# Patient Record
Sex: Female | Born: 1965
Health system: Southern US, Community
[De-identification: ages and names within clinical notes are randomized; demographics above are authoritative.]

## PROBLEM LIST (undated history)

## (undated) DIAGNOSIS — G479 Sleep disorder, unspecified: Secondary | ICD-10-CM

## (undated) DIAGNOSIS — F329 Major depressive disorder, single episode, unspecified: Secondary | ICD-10-CM

## (undated) DIAGNOSIS — R32 Unspecified urinary incontinence: Secondary | ICD-10-CM

## (undated) DIAGNOSIS — R12 Heartburn: Secondary | ICD-10-CM

## (undated) DIAGNOSIS — M549 Dorsalgia, unspecified: Secondary | ICD-10-CM

## (undated) DIAGNOSIS — R519 Headache, unspecified: Secondary | ICD-10-CM

## (undated) DIAGNOSIS — M51369 Other intervertebral disc degeneration, lumbar region without mention of lumbar back pain or lower extremity pain: Secondary | ICD-10-CM

## (undated) DIAGNOSIS — B019 Varicella without complication: Secondary | ICD-10-CM

## (undated) DIAGNOSIS — M5136 Other intervertebral disc degeneration, lumbar region: Secondary | ICD-10-CM

## (undated) DIAGNOSIS — G40909 Epilepsy, unspecified, not intractable, without status epilepticus: Secondary | ICD-10-CM

## (undated) DIAGNOSIS — N3010 Interstitial cystitis (chronic) without hematuria: Secondary | ICD-10-CM

## (undated) DIAGNOSIS — R51 Headache: Secondary | ICD-10-CM

## (undated) DIAGNOSIS — R5383 Other fatigue: Secondary | ICD-10-CM

## (undated) DIAGNOSIS — K219 Gastro-esophageal reflux disease without esophagitis: Secondary | ICD-10-CM

## (undated) DIAGNOSIS — R002 Palpitations: Secondary | ICD-10-CM

## (undated) DIAGNOSIS — M545 Low back pain, unspecified: Secondary | ICD-10-CM

## (undated) DIAGNOSIS — E785 Hyperlipidemia, unspecified: Secondary | ICD-10-CM

## (undated) DIAGNOSIS — M069 Rheumatoid arthritis, unspecified: Secondary | ICD-10-CM

## (undated) DIAGNOSIS — H539 Unspecified visual disturbance: Secondary | ICD-10-CM

## (undated) DIAGNOSIS — M5126 Other intervertebral disc displacement, lumbar region: Secondary | ICD-10-CM

## (undated) DIAGNOSIS — I10 Essential (primary) hypertension: Secondary | ICD-10-CM

## (undated) DIAGNOSIS — R42 Dizziness and giddiness: Secondary | ICD-10-CM

## (undated) DIAGNOSIS — K829 Disease of gallbladder, unspecified: Secondary | ICD-10-CM

## (undated) DIAGNOSIS — F32A Depression, unspecified: Secondary | ICD-10-CM

## (undated) DIAGNOSIS — G4730 Sleep apnea, unspecified: Secondary | ICD-10-CM

## (undated) DIAGNOSIS — E559 Vitamin D deficiency, unspecified: Secondary | ICD-10-CM

## (undated) DIAGNOSIS — R404 Transient alteration of awareness: Secondary | ICD-10-CM

## (undated) DIAGNOSIS — K589 Irritable bowel syndrome without diarrhea: Secondary | ICD-10-CM

## (undated) DIAGNOSIS — M722 Plantar fascial fibromatosis: Secondary | ICD-10-CM

## (undated) DIAGNOSIS — K5900 Constipation, unspecified: Secondary | ICD-10-CM

## (undated) DIAGNOSIS — T7840XA Allergy, unspecified, initial encounter: Secondary | ICD-10-CM

## (undated) DIAGNOSIS — H40009 Preglaucoma, unspecified, unspecified eye: Secondary | ICD-10-CM

## (undated) DIAGNOSIS — R0602 Shortness of breath: Secondary | ICD-10-CM

## (undated) DIAGNOSIS — J45909 Unspecified asthma, uncomplicated: Secondary | ICD-10-CM

## (undated) DIAGNOSIS — F419 Anxiety disorder, unspecified: Secondary | ICD-10-CM

## (undated) HISTORY — DX: Disease of gallbladder, unspecified: K82.9

## (undated) HISTORY — DX: Headache, unspecified: R51.9

## (undated) HISTORY — DX: Interstitial cystitis (chronic) without hematuria: N30.10

## (undated) HISTORY — DX: Depression, unspecified: F32.A

## (undated) HISTORY — PX: OTHER SURGICAL HISTORY: SHX169

## (undated) HISTORY — DX: Other fatigue: R53.83

## (undated) HISTORY — DX: Dizziness and giddiness: R42

## (undated) HISTORY — DX: Major depressive disorder, single episode, unspecified: F32.9

## (undated) HISTORY — DX: Other intervertebral disc degeneration, lumbar region: M51.36

## (undated) HISTORY — DX: Shortness of breath: R06.02

## (undated) HISTORY — DX: Dorsalgia, unspecified: M54.9

## (undated) HISTORY — DX: Transient alteration of awareness: R40.4

## (undated) HISTORY — DX: Vitamin D deficiency, unspecified: E55.9

## (undated) HISTORY — DX: Sleep disorder, unspecified: G47.9

## (undated) HISTORY — DX: Other intervertebral disc displacement, lumbar region: M51.26

## (undated) HISTORY — DX: Rheumatoid arthritis, unspecified: M06.9

## (undated) HISTORY — DX: Other intervertebral disc degeneration, lumbar region without mention of lumbar back pain or lower extremity pain: M51.369

## (undated) HISTORY — DX: Unspecified urinary incontinence: R32

## (undated) HISTORY — PX: NASAL SINUS SURGERY: SHX719

## (undated) HISTORY — DX: Hyperlipidemia, unspecified: E78.5

## (undated) HISTORY — DX: Varicella without complication: B01.9

## (undated) HISTORY — DX: Heartburn: R12

## (undated) HISTORY — DX: Unspecified visual disturbance: H53.9

## (undated) HISTORY — DX: Headache: R51

## (undated) HISTORY — PX: CHOLECYSTECTOMY: SHX55

## (undated) HISTORY — DX: Allergy, unspecified, initial encounter: T78.40XA

## (undated) HISTORY — DX: Unspecified asthma, uncomplicated: J45.909

## (undated) HISTORY — DX: Irritable bowel syndrome, unspecified: K58.9

## (undated) HISTORY — DX: Constipation, unspecified: K59.00

---

## 1997-05-20 HISTORY — PX: TUBAL LIGATION: SHX77

## 2005-06-16 ENCOUNTER — Emergency Department: Payer: Self-pay | Admitting: Emergency Medicine

## 2006-05-05 ENCOUNTER — Ambulatory Visit: Payer: Self-pay | Admitting: Unknown Physician Specialty

## 2010-12-24 DIAGNOSIS — M545 Low back pain: Secondary | ICD-10-CM | POA: Insufficient documentation

## 2014-04-04 ENCOUNTER — Ambulatory Visit: Payer: Self-pay | Admitting: Family Medicine

## 2014-04-09 ENCOUNTER — Other Ambulatory Visit: Payer: Self-pay | Admitting: Internal Medicine

## 2014-05-26 ENCOUNTER — Ambulatory Visit (INDEPENDENT_AMBULATORY_CARE_PROVIDER_SITE_OTHER): Payer: BLUE CROSS/BLUE SHIELD | Admitting: Internal Medicine

## 2014-05-26 ENCOUNTER — Encounter: Payer: Self-pay | Admitting: Internal Medicine

## 2014-05-26 VITALS — BP 136/84 | HR 65 | Temp 98.1°F | Ht 67.25 in | Wt 245.0 lb

## 2014-05-26 DIAGNOSIS — R5383 Other fatigue: Secondary | ICD-10-CM

## 2014-05-26 DIAGNOSIS — J4530 Mild persistent asthma, uncomplicated: Secondary | ICD-10-CM

## 2014-05-26 DIAGNOSIS — J45909 Unspecified asthma, uncomplicated: Secondary | ICD-10-CM | POA: Insufficient documentation

## 2014-05-26 DIAGNOSIS — J069 Acute upper respiratory infection, unspecified: Secondary | ICD-10-CM

## 2014-05-26 DIAGNOSIS — K589 Irritable bowel syndrome without diarrhea: Secondary | ICD-10-CM

## 2014-05-26 DIAGNOSIS — K219 Gastro-esophageal reflux disease without esophagitis: Secondary | ICD-10-CM

## 2014-05-26 DIAGNOSIS — G4700 Insomnia, unspecified: Secondary | ICD-10-CM | POA: Insufficient documentation

## 2014-05-26 DIAGNOSIS — E785 Hyperlipidemia, unspecified: Secondary | ICD-10-CM

## 2014-05-26 MED ORDER — HYOSCYAMINE SULFATE 0.125 MG PO TABS: 0.1250 mg | ORAL_TABLET | Freq: Four times a day (QID) | ORAL | Status: DC | PRN

## 2014-05-26 MED ORDER — OMEPRAZOLE 20 MG PO CPDR: 20.0000 mg | DELAYED_RELEASE_CAPSULE | Freq: Every day | ORAL | Status: DC

## 2014-05-26 MED ORDER — MONTELUKAST SODIUM 10 MG PO TABS: 10.0000 mg | ORAL_TABLET | Freq: Every day | ORAL | Status: DC

## 2014-05-26 MED ORDER — FLUTICASONE-SALMETEROL 250-50 MCG/DOSE IN AEPB: 1.0000 | INHALATION_SPRAY | Freq: Every day | RESPIRATORY_TRACT | Status: DC

## 2014-05-26 NOTE — Patient Instructions (Signed)

## 2014-05-26 NOTE — Progress Notes (Signed)
Pre visit review using our clinic review tool, if applicable. No additional management support is needed unless otherwise documented below in the visit note. 

## 2014-05-26 NOTE — Assessment & Plan Note (Signed)
Stable on clonazepam No changes

## 2014-05-26 NOTE — Assessment & Plan Note (Signed)
Stable on Prilosec No changes

## 2014-05-26 NOTE — Assessment & Plan Note (Signed)
Denies issues on Simcor Will check lipid profile at physical exam

## 2014-05-26 NOTE — Assessment & Plan Note (Signed)
Will refill advair and singulair today She has albuterol inhaler

## 2014-05-26 NOTE — Progress Notes (Signed)
HPI  Pt presents to the clinic today to establish care. She is transferring care from Dr. Graciela Husbands at [facility].  Flu: 2014 Tetanus: < 10 years ago LMP: post menopausal Pap Smear: 2014, scheduled 06/2014 Mammogram: 2013, scheduled 06/2014 Vision Screening: 03/2014 Dentist: Biannually  Allergy induced asthma: mild persisent. Usually well controlled on Advair and singulair. She has run out of both of these medications. She has developed a cough, runny nose and some chest tightness over the last 3 days. She is blowing clear mucous out of her nose. Her cough is unproductive. She denies fever, chills or body aches. She has tried Mucinex and her albuterol with some relief.  HLD: Denies myalgias on Simcor. Tries to maintain a low fat diet.  IBS: Stable recently. She takes Zoloft daily and Levsin only as needed. She is currently out and does request a refill today.  Insomnia: Takes clonazepam nightly for this. She reports that it does cause her to have very vivid dreams but she is unable to sleep without it.  GERD: Denies symptoms on current dose of prilosec. She does not have breakthrough symptoms.  She does have some concerns today about extreme fatigue. This has been going on the last few weeks. She does work one full time job and one part time job. She has been working a lot recently. She also reports that the fatigue may be worse due to her cold symptoms. She reports that she is not sure what is going on with her but she is unable to function like this. She does request blood work to workup her fatigue today.  Past Medical History  Diagnosis Date  . Asthma   . Chicken pox   . Hyperlipidemia   . Allergy   . IBS (irritable bowel syndrome)   . Frequent headaches   . Interstitial cystitis   . Urine incontinence     Current Outpatient Prescriptions  Medication Sig Dispense Refill  . albuterol (PROAIR HFA) 108 (90 BASE) MCG/ACT inhaler Inhale into the lungs.    [name] aspirin 162  MG EC tablet Take by mouth.    . Cholecalciferol (VITAMIN D-3) 1000 UNITS CAPS Take 3 capsules by mouth daily. 3000 units    . clonazePAM (KLONOPIN) 0.5 MG tablet Take by mouth.    . hyoscyamine (LEVSIN, ANASPAZ) 0.125 MG tablet Take by mouth.    . montelukast (SINGULAIR) 10 MG tablet Take by mouth.    . niacin-simvastatin (SIMCOR) 1000-20 MG 24 hr tablet TAKE 1 TABLET BY MOUTH DAILY.    [name] omeprazole (PRILOSEC) 20 MG capsule Take by mouth.    . sertraline (ZOLOFT) 100 MG tablet Take by mouth.     No current facility-administered medications for this visit.    Allergies  Allergen Reactions  . Sulfa Antibiotics Hives    Family History  Problem Relation Age of Onset  . COPD Mother   . Heart disease Mother   . Polymyalgia rheumatica Mother   . Emphysema Mother   . Stroke Mother   . Heart disease Father   . Hypertension Father   . Cancer Paternal Aunt     Breast  . Stroke Maternal Grandmother     History   Social History  . Marital Status: Married    Spouse Name: N/A    Number of Children: N/A  . Years of Education: N/A   Occupational History  . Not on file.   Social History Main Topics  . Smoking status: Never Smoker   .  Smokeless tobacco: Never Used  . Alcohol Use: 0.0 oz/week    0 Not specified per week     Comment: rare--wine  . Drug Use: No  . Sexual Activity: Not on file   Other Topics Concern  . Not on file   Social History Narrative  . No narrative on file    ROS:  Constitutional: Pt reports fatigue. Denies fever, malaise, headache or abrupt weight changes.  HEENT: Pt reports runny nose. Denies eye pain, eye redness, ear pain, ringing in the ears, wax buildup, nasal congestion, bloody nose, or sore throat. Respiratory: Pt reports cough. Denies difficulty breathing, shortness of breath, or sputum production.   Cardiovascular: Pt reports chest tightness. Denies chest pain, palpitations or swelling in the hands or feet.  Gastrointestinal: Denies  abdominal pain, bloating, constipation, diarrhea or blood in the stool.    No other specific complaints in a complete review of systems (except as listed in HPI above).  PE:  BP 136/84 mmHg  Pulse 65  Temp(Src) 98.1 F (36.7 C) (Oral)  Ht 5' 7.25" (1.708 m)  Wt 245 lb (111.131 kg)  BMI 38.09 kg/m2  SpO2 98%  LMP 05/21/2014 Wt Readings from Last 3 Encounters:  05/26/14 245 lb (111.131 kg)    General: Appears her stated age, obese but well developed, well nourished in NAD. HEENT: Head: normal shape and size, no sinus tenderness noted; Eyes: sclera white, no icterus, conjunctiva pink,; Ears: Tm's gray and intact, normal light reflex; Nose: mucosa pink and moist, septum midline; Throat/Mouth: Teeth present, mucosa pink and moist, no lesions or ulcerations noted.  Neck:  Neck supple, trachea midline. No masses, lumps or thyromegaly present.  Cardiovascular: Normal rate and rhythm. S1,S2 noted.  No murmur, rubs or gallops noted.  Pulmonary/Chest: Normal effort and positive vesicular breath sounds. No respiratory distress. No wheezes, rales or ronchi noted.  Abdomen: Soft and nontender. Normal bowel sounds, no bruits noted. No distention or masses noted. Liver, spleen and kidneys non palpable.   Assessment and Plan:  Fatigue:  Will check CBC, CMET, TSH, B12, Folate, Vit D today She seems to be working a lot- maybe some overexertion Advised her to try to take a break from her part time job for a week if she can to allow her body to rest and recover.  URI:  Viral Supportive care, rest, fluids, tylenol Make a follow up appt to discuss things we could not go over today at your new pt appt

## 2014-05-26 NOTE — Assessment & Plan Note (Signed)
Symptoms controlled on Zoloft and Levsin Avoid foods that trigger your IBS Levsin refilled today

## 2014-05-27 LAB — COMPREHENSIVE METABOLIC PANEL
ALBUMIN: 4 g/dL (ref 3.5–5.2)
ALT: 16 U/L (ref 0–35)
AST: 20 U/L (ref 0–37)
Alkaline Phosphatase: 82 U/L (ref 39–117)
BILIRUBIN TOTAL: 0.4 mg/dL (ref 0.2–1.2)
BUN: 16 mg/dL (ref 6–23)
CALCIUM: 8.9 mg/dL (ref 8.4–10.5)
CO2: 26 mEq/L (ref 19–32)
Chloride: 106 mEq/L (ref 96–112)
Creatinine, Ser: 0.7 mg/dL (ref 0.4–1.2)
GFR: 89.81 mL/min (ref >60.00)
GLUCOSE: 88 mg/dL (ref 70–99)
Potassium: 4.3 mEq/L (ref 3.5–5.1)
Sodium: 140 mEq/L (ref 135–145)
TOTAL PROTEIN: 6.9 g/dL (ref 6.0–8.3)

## 2014-05-27 LAB — CBC
HEMATOCRIT: 37.8 % (ref 36.0–46.0)
HEMOGLOBIN: 12.5 g/dL (ref 12.0–15.0)
MCHC: 33.1 g/dL (ref 30.0–36.0)
MCV: 93.1 fl (ref 78.0–100.0)
Platelets: 233 10*3/uL (ref 150.0–400.0)
RBC: 4.06 Mil/uL (ref 3.87–5.11)
RDW: 13 % (ref 11.5–15.5)
WBC: 7.3 10*3/uL (ref 4.0–10.5)

## 2014-05-27 LAB — VITAMIN B12: Vitamin B-12: 431 pg/mL (ref 211–911)

## 2014-05-27 LAB — FOLATE: FOLATE: 14 ng/mL (ref >5.9)

## 2014-05-27 LAB — TSH: TSH: 1.34 u[IU]/mL (ref 0.35–4.50)

## 2014-05-27 LAB — VITAMIN D 25 HYDROXY (VIT D DEFICIENCY, FRACTURES): VITD: 22.63 ng/mL — AB (ref 30.00–100.00)

## 2014-05-31 ENCOUNTER — Telehealth: Payer: Self-pay | Admitting: Internal Medicine

## 2014-05-31 NOTE — Telephone Encounter (Signed)
Patient returned your call.  She's going to do car pool. Please call her back after 3:30.

## 2014-06-02 ENCOUNTER — Ambulatory Visit: Payer: BLUE CROSS/BLUE SHIELD | Admitting: Internal Medicine

## 2014-06-03 MED ORDER — VITAMIN D (ERGOCALCIFEROL) 1.25 MG (50000 UNIT) PO CAPS: 50000.0000 [IU] | ORAL_CAPSULE | ORAL | Status: DC

## 2014-06-03 NOTE — Addendum Note (Signed)
Addended by: Roena Malady on: 06/03/2014 10:42 AM   Modules accepted: Orders

## 2014-06-07 ENCOUNTER — Ambulatory Visit: Payer: BLUE CROSS/BLUE SHIELD | Admitting: Internal Medicine

## 2014-06-09 ENCOUNTER — Telehealth: Payer: Self-pay | Admitting: Internal Medicine

## 2014-06-09 ENCOUNTER — Ambulatory Visit: Payer: BLUE CROSS/BLUE SHIELD | Admitting: Internal Medicine

## 2014-06-09 NOTE — Telephone Encounter (Signed)
Patient did not come for their scheduled appointment today for follow up Please let me know if the patient needs to be contacted immediately for follow up or if no follow up is necessary.   ° °

## 2014-06-13 NOTE — Telephone Encounter (Signed)
No follow up needed

## 2014-06-26 ENCOUNTER — Other Ambulatory Visit: Payer: Self-pay | Admitting: Internal Medicine

## 2014-06-27 NOTE — Telephone Encounter (Signed)
RX sent into pharmacy

## 2014-06-27 NOTE — Telephone Encounter (Signed)
i do not see where you have ever prescribed this medication--please advise if okay to refill--last OV 05/26/2014

## 2014-07-12 ENCOUNTER — Telehealth: Payer: Self-pay | Admitting: Internal Medicine

## 2014-07-12 ENCOUNTER — Ambulatory Visit: Payer: BLUE CROSS/BLUE SHIELD | Admitting: Internal Medicine

## 2014-07-12 NOTE — Telephone Encounter (Signed)
Patient did not come for their scheduled appointment today for follow up Please let me know if the patient needs to be contacted immediately for follow up or if no follow up is necessary.   ° °

## 2014-07-13 NOTE — Telephone Encounter (Signed)
No follow up needed but if she no shows another appt she will be dismissed from the practice

## 2014-07-18 ENCOUNTER — Encounter: Payer: Self-pay | Admitting: Internal Medicine

## 2014-07-18 ENCOUNTER — Ambulatory Visit (INDEPENDENT_AMBULATORY_CARE_PROVIDER_SITE_OTHER): Payer: BLUE CROSS/BLUE SHIELD | Admitting: Internal Medicine

## 2014-07-18 VITALS — BP 134/76 | HR 82 | Temp 98.1°F | Wt 242.0 lb

## 2014-07-18 DIAGNOSIS — R0683 Snoring: Secondary | ICD-10-CM

## 2014-07-18 DIAGNOSIS — J309 Allergic rhinitis, unspecified: Secondary | ICD-10-CM

## 2014-07-18 DIAGNOSIS — G4719 Other hypersomnia: Secondary | ICD-10-CM

## 2014-07-18 DIAGNOSIS — E785 Hyperlipidemia, unspecified: Secondary | ICD-10-CM

## 2014-07-18 MED ORDER — PREDNISONE 10 MG PO TABS: ORAL_TABLET | ORAL | Status: DC

## 2014-07-18 MED ORDER — SIMVASTATIN 20 MG PO TABS: 20.0000 mg | ORAL_TABLET | Freq: Every day | ORAL | Status: DC

## 2014-07-18 MED ORDER — NIACIN ER (ANTIHYPERLIPIDEMIC) 1000 MG PO TBCR: 1000.0000 mg | EXTENDED_RELEASE_TABLET | Freq: Every day | ORAL | Status: DC

## 2014-07-18 NOTE — Patient Instructions (Signed)
Fat and Cholesterol Control Diet Fat and cholesterol levels in your blood and organs are influenced by your diet. High levels of fat and cholesterol may lead to diseases of the heart, small and large blood vessels, gallbladder, liver, and pancreas. CONTROLLING FAT AND CHOLESTEROL WITH DIET Although exercise and lifestyle factors are important, your diet is key. That is because certain foods are known to raise cholesterol and others to lower it. The goal is to balance foods for their effect on cholesterol and more importantly, to replace saturated and trans fat with other types of fat, such as monounsaturated fat, polyunsaturated fat, and omega-3 fatty acids. On average, a person should consume no more than 15 to 17 g of saturated fat daily. Saturated and trans fats are considered "bad" fats, and they will raise LDL cholesterol. Saturated fats are primarily found in animal products such as meats, butter, and cream. However, that does not mean you need to give up all your favorite foods. Today, there are good tasting, low-fat, low-cholesterol substitutes for most of the things you like to eat. Choose low-fat or nonfat alternatives. Choose round or loin cuts of red meat. These types of cuts are lowest in fat and cholesterol. Chicken (without the skin), fish, veal, and ground turkey breast are great choices. Eliminate fatty meats, such as hot dogs and salami. Even shellfish have little or no saturated fat. Have a 3 oz (85 g) portion when you eat lean meat, poultry, or fish. Trans fats are also called "partially hydrogenated oils." They are oils that have been scientifically manipulated so that they are solid at room temperature resulting in a longer shelf life and improved taste and texture of foods in which they are added. Trans fats are found in stick margarine, some tub margarines, cookies, crackers, and baked goods.  When baking and cooking, oils are a great substitute for butter. The monounsaturated oils are  especially beneficial since it is believed they lower LDL and raise HDL. The oils you should avoid entirely are saturated tropical oils, such as coconut and palm.  Remember to eat a lot from food groups that are naturally free of saturated and trans fat, including fish, fruit, vegetables, beans, grains (barley, rice, couscous, bulgur wheat), and pasta (without cream sauces).  IDENTIFYING FOODS THAT LOWER FAT AND CHOLESTEROL  Soluble fiber may lower your cholesterol. This type of fiber is found in fruits such as apples, vegetables such as broccoli, potatoes, and carrots, legumes such as beans, peas, and lentils, and grains such as barley. Foods fortified with plant sterols (phytosterol) may also lower cholesterol. You should eat at least 2 g per day of these foods for a cholesterol lowering effect.  Read package labels to identify low-saturated fats, trans fat free, and low-fat foods at the supermarket. Select cheeses that have only 2 to 3 g saturated fat per ounce. Use a heart-healthy tub margarine that is free of trans fats or partially hydrogenated oil. When buying baked goods (cookies, crackers), avoid partially hydrogenated oils. Breads and muffins should be made from whole grains (whole-wheat or whole oat flour, instead of "flour" or "enriched flour"). Buy non-creamy canned soups with reduced salt and no added fats.  FOOD PREPARATION TECHNIQUES  Never deep-fry. If you must fry, either stir-fry, which uses very little fat, or use non-stick cooking sprays. When possible, broil, bake, or roast meats, and steam vegetables. Instead of putting butter or margarine on vegetables, use lemon and herbs, applesauce, and cinnamon (for squash and sweet potatoes). Use nonfat   yogurt, salsa, and low-fat dressings for salads.  LOW-SATURATED FAT / LOW-FAT FOOD SUBSTITUTES Meats / Saturated Fat (g)  Avoid: Steak, marbled (3 oz/85 g) / 11 g  Choose: Steak, lean (3 oz/85 g) / 4 g  Avoid: Hamburger (3 oz/85 g) / 7  g  Choose: Hamburger, lean (3 oz/85 g) / 5 g  Avoid: Ham (3 oz/85 g) / 6 g  Choose: Ham, lean cut (3 oz/85 g) / 2.4 g  Avoid: Chicken, with skin, dark meat (3 oz/85 g) / 4 g  Choose: Chicken, skin removed, dark meat (3 oz/85 g) / 2 g  Avoid: Chicken, with skin, light meat (3 oz/85 g) / 2.5 g  Choose: Chicken, skin removed, light meat (3 oz/85 g) / 1 g Dairy / Saturated Fat (g)  Avoid: Whole milk (1 cup) / 5 g  Choose: Low-fat milk, 2% (1 cup) / 3 g  Choose: Low-fat milk, 1% (1 cup) / 1.5 g  Choose: Skim milk (1 cup) / 0.3 g  Avoid: Hard cheese (1 oz/28 g) / 6 g  Choose: Skim milk cheese (1 oz/28 g) / 2 to 3 g  Avoid: Cottage cheese, 4% fat (1 cup) / 6.5 g  Choose: Low-fat cottage cheese, 1% fat (1 cup) / 1.5 g  Avoid: Ice cream (1 cup) / 9 g  Choose: Sherbet (1 cup) / 2.5 g  Choose: Nonfat frozen yogurt (1 cup) / 0.3 g  Choose: Frozen fruit bar / trace  Avoid: Whipped cream (1 tbs) / 3.5 g  Choose: Nondairy whipped topping (1 tbs) / 1 g Condiments / Saturated Fat (g)  Avoid: Mayonnaise (1 tbs) / 2 g  Choose: Low-fat mayonnaise (1 tbs) / 1 g  Avoid: Butter (1 tbs) / 7 g  Choose: Extra light margarine (1 tbs) / 1 g  Avoid: Coconut oil (1 tbs) / 11.8 g  Choose: Olive oil (1 tbs) / 1.8 g  Choose: Corn oil (1 tbs) / 1.7 g  Choose: Safflower oil (1 tbs) / 1.2 g  Choose: Sunflower oil (1 tbs) / 1.4 g  Choose: Soybean oil (1 tbs) / 2.4 g  Choose: Canola oil (1 tbs) / 1 g Document Released: 05/06/2005 Document Revised: 08/31/2012 Document Reviewed: 08/04/2013 Exit[employer] Patient Information 2015 [employer]. This information is not intended to replace advice given to you by your health care provider. Make sure you discuss any questions you have with your health care provider.  

## 2014-07-18 NOTE — Progress Notes (Signed)
Subjective:    Patient ID: Donna Vasquez, female    DOB: 05/07/1966, 49 y.o.   MRN: [id number]  HPI  Pt presents to the clinic today with multiple complaints.  Daytime Fatigue. She reports this has been going on for years. Her previous MD increased her Zoloft. This did seem to help for a little while. She reports it did make her feel numb for a little while and she didn't like that. She is experiencing more migraines and memory impairment. She is not sure if her symptoms are related to stress or lack of sleep. She is sleeping about 5-6 hour  per night. Her husband reports that she does snore. She has never been assessed for sleep apnea.  She also reports headache and sinus pressure. She reports the headache occurs with the weather changes. It starts behind her eyes and radiates into her temples and her teeth.  This has been intermittent over the last few months. She is blowing clear mucous out of her nose. She denies fever, chills or body aches. She does have a history of seasonal allergies. She is taking singulair daily. She does not smoke.  Also, she reports her insurance will not pay for her cholesterol medication anymore. She does request an alternative.  Review of Systems      Past Medical History  Diagnosis Date  . Asthma   . Chicken pox   . Hyperlipidemia   . Allergy   . IBS (irritable bowel syndrome)   . Frequent headaches   . Interstitial cystitis   . Urine incontinence     Current Outpatient Prescriptions  Medication Sig Dispense Refill  . albuterol (PROAIR HFA) 108 (90 BASE) MCG/ACT inhaler Inhale into the lungs.    [name] aspirin 162 MG EC tablet Take by mouth.    . Cholecalciferol (VITAMIN D-3) 1000 UNITS CAPS Take 3 capsules by mouth daily. 3000 units    . clonazePAM (KLONOPIN) 0.5 MG tablet Take by mouth.    . Fluticasone-Salmeterol (ADVAIR) 250-50 MCG/DOSE AEPB Inhale 1 puff into the lungs daily. 60 each 2  . hyoscyamine (LEVSIN, ANASPAZ) 0.125 MG tablet Take 1  tablet (0.125 mg total) by mouth every 6 (six) hours as needed. 30 tablet 2  . montelukast (SINGULAIR) 10 MG tablet Take 1 tablet (10 mg total) by mouth at bedtime. 30 tablet 2  . niacin-simvastatin (SIMCOR) 1000-20 MG 24 hr tablet TAKE 1 TABLET BY MOUTH DAILY.    [name] omeprazole (PRILOSEC) 20 MG capsule Take 1 capsule (20 mg total) by mouth daily. 30 capsule 2  . sertraline (ZOLOFT) 100 MG tablet TAKE 1 TABLET (100 MG TOTAL) BY MOUTH ONCE DAILY. 30 tablet 5  . Vitamin D, Ergocalciferol, (DRISDOL) 50000 UNITS CAPS capsule Take 1 capsule (50,000 Units total) by mouth every 7 (seven) days. 12 capsule 0   No current facility-administered medications for this visit.    Allergies  Allergen Reactions  . Sulfa Antibiotics Hives    Family History  Problem Relation Age of Onset  . COPD Mother   . Heart disease Mother   . Polymyalgia rheumatica Mother   . Emphysema Mother   . Stroke Mother   . Heart disease Father   . Hypertension Father   . Cancer Paternal Aunt     Breast  . Stroke Maternal Grandmother     History   Social History  . Marital Status: Married    Spouse Name: N/A  . Number of Children: N/A  . Years of Education:  N/A   Occupational History  . Not on file.   Social History Main Topics  . Smoking status: Never Smoker   . Smokeless tobacco: Never Used  . Alcohol Use: 0.0 oz/week    0 Standard drinks or equivalent per week     Comment: rare--wine  . Drug Use: No  . Sexual Activity: Not on file   Other Topics Concern  . Not on file   Social History Narrative     Constitutional: Pt reports fatigue and headache. Denies fever, malaise, or abrupt weight changes.  HEENT: Pt reports facial pain. Denies eye pain, eye redness, ear pain, ringing in the ears, wax buildup, runny nose, nasal congestion, bloody nose, or sore throat. Respiratory: Denies difficulty breathing, shortness of breath, cough or sputum production.   Cardiovascular: Denies chest pain, chest tightness,  palpitations or swelling in the hands or feet.  Neurological: Pt reports memory impairment. Denies dizziness, difficulty with speech or problems with balance and coordination.  Psych: Pt reports stress but denies anxiety, depression, SI/HI.  No other specific complaints in a complete review of systems (except as listed in HPI above).  Objective:   Physical Exam  BP 134/76 mmHg  Pulse 82  Temp(Src) 98.1 F (36.7 C) (Oral)  Wt 242 lb (109.77 kg)  SpO2 98% Wt Readings from Last 3 Encounters:  07/18/14 242 lb (109.77 kg)  05/26/14 245 lb (111.131 kg)    General: Appears her stated age, well developed, well nourished in NAD. Skin: Warm, dry and intact. No rashes, lesions or ulcerations noted. HEENT: Head: normal shape and size, no sinus tenderness noted; Eyes: sclera white, no icterus, conjunctiva pink; Ears: Tm's gray and intact, normal light reflex; Nose: mucosa boggy and moist, septum midline; Throat/Mouth: + PND,Teeth present, mucosa pink and moist, no exudate, lesions or ulcerations noted.  Neck: No adenopathy noted. Cardiovascular: Normal rate and rhythm. S1,S2 noted.  No murmur, rubs or gallops noted.  Pulmonary/Chest: Normal effort and positive vesicular breath sounds. No respiratory distress. No wheezes, rales or ronchi noted.  Neurological: Alert and oriented.  Psychiatric: Mood and affect flat Behavior is normal. Thoughts seem fleeting at times.   BMET    Component Value Date/Time   NA 140 05/26/2014 1631   K 4.3 05/26/2014 1631   CL 106 05/26/2014 1631   CO2 26 05/26/2014 1631   GLUCOSE 88 05/26/2014 1631   BUN 16 05/26/2014 1631   CREATININE 0.7 05/26/2014 1631   CALCIUM 8.9 05/26/2014 1631    Lipid Panel  No results found for: CHOL, TRIG, HDL, CHOLHDL, VLDL, LDLCALC  CBC    Component Value Date/Time   WBC 7.3 05/26/2014 1631   RBC 4.06 05/26/2014 1631   HGB 12.5 05/26/2014 1631   HCT 37.8 05/26/2014 1631   PLT 233.0 05/26/2014 1631   MCV 93.1 05/26/2014  1631   MCHC 33.1 05/26/2014 1631   RDW 13.0 05/26/2014 1631    Hgb A1C No results found for: HGBA1C       Assessment & Plan:   Excessive Daytime Sleepiness:  B12, TSH, CBC and Vit D checked at last visit Vit D was low but she is on RX supplement and not feeling any better ? OSA vs stress Continue current dose of Zoloft for now Lets refer to pulmonology for sleep studay  Allergic Rhinitis:  She is taking singulair daily-continue She does not like inrtanasal steroids Will give RX for pred taper  HLD:  No lipid profile to review She has been out  of her medication for 2 weeks Will d/c simcor RX for zocor and niacin Encouraged her to consume a low fat diet Will repeat lipid profile in 3 months  RTC in 3 months or sooner if needed

## 2014-07-18 NOTE — Progress Notes (Signed)
Pre visit review using our clinic review tool, if applicable. No additional management support is needed unless otherwise documented below in the visit note. 

## 2014-08-19 ENCOUNTER — Ambulatory Visit (INDEPENDENT_AMBULATORY_CARE_PROVIDER_SITE_OTHER): Payer: BC Managed Care – PPO | Admitting: Pulmonary Disease

## 2014-08-19 ENCOUNTER — Encounter: Payer: Self-pay | Admitting: Pulmonary Disease

## 2014-08-19 VITALS — BP 110/64 | HR 80 | Temp 97.2°F | Ht 67.5 in | Wt 243.0 lb

## 2014-08-19 DIAGNOSIS — G4733 Obstructive sleep apnea (adult) (pediatric): Secondary | ICD-10-CM | POA: Diagnosis not present

## 2014-08-19 NOTE — Progress Notes (Signed)
Subjective:    Patient ID: Donna Vasquez, female    DOB: 28-Mar-1966, 49 y.o.   MRN: [id number]  HPI The patient is a 49 year old female who I've been asked to see for possible obstructive sleep apnea. She has been told that she has very severe snoring, and her husband has to sleep in a different room. No one has ever commented about witnessed apneas, but she has definite snoring and occasional choking arousals. She has frequent awakenings at night, and is not rested in the mornings upon arising. She has severe fatigue during the day, but also has definite sleep pressure with periods of inactivity. She has sleepiness in the evenings trying to watch television or movies, and occasional sleep pressure with driving. Her weight is been stable over the last 2 years, and her Epworth score today is 10   Sleep Questionnaire What time do you typically go to bed?( Between what hours) various times various times at 1504 on 08/19/14 by Tommie Sams, CMA How long does it take you to fall asleep? 15-30 min 15-30 min at 1504 on 08/19/14 by Tommie Sams, CMA How many times during the night do you wake up? 2 2 at 1504 on 08/19/14 by Tommie Sams, CMA What time do you get out of bed to start your day? 0600 0600 at 1504 on 08/19/14 by Tommie Sams, CMA Do you drive or operate heavy machinery in your occupation? No No at 1504 on 08/19/14 by Tommie Sams, CMA How much has your weight changed (up or down) over the past two years? (In pounds) 0 oz (0 kg) 0 oz (0 kg) at 1504 on 08/19/14 by Tommie Sams, CMA Have you ever had a sleep study before? No No at 1504 on 08/19/14 by Tommie Sams, CMA Do you currently use CPAP? No No at 1504 on 08/19/14 by Tommie Sams, CMA Do you wear oxygen at any time? No No at 1504 on 08/19/14 by Tommie Sams, CMA   Review of Systems  Constitutional: Negative for fever and unexpected weight change.  HENT: Positive for congestion and ear pain. Negative for  dental problem, nosebleeds, postnasal drip, rhinorrhea, sinus pressure, sneezing, sore throat and trouble swallowing.   Eyes: Negative for redness and itching.  Respiratory: Positive for shortness of breath. Negative for cough, chest tightness and wheezing.   Cardiovascular: Negative for palpitations and leg swelling.  Gastrointestinal: Positive for abdominal pain. Negative for nausea and vomiting.  Genitourinary: Negative for dysuria.  Musculoskeletal: Positive for arthralgias. Negative for joint swelling.  Skin: Negative for rash.  Neurological: Positive for headaches.  Hematological: Does not bruise/bleed easily.  Psychiatric/Behavioral: Positive for dysphoric mood. The patient is not nervous/anxious.        Objective:   Physical Exam Constitutional:  Obese female, no acute distress  HENT:  Nares patent without discharge, mild septal deviation to the left with narrowing.  Oropharynx without exudate, palate and uvula are moderately elongated.  Eyes:  Perrla, eomi, no scleral icterus  Neck:  No JVD, no TMG  Cardiovascular:  Normal rate, regular rhythm, no rubs or gallops.  No murmurs        Intact distal pulses  Pulmonary :  Normal breath sounds, no stridor or respiratory distress   No rales, rhonchi, or wheezing  Abdominal:  Soft, nondistended, bowel sounds present.  No tenderness noted.   Musculoskeletal:  No lower extremity edema noted.  Lymph Nodes:  No cervical lymphadenopathy noted  Skin:  No cyanosis noted  Neurologic:  Alert, appropriate, moves all 4 extremities without obvious deficit.         Assessment & Plan:

## 2014-08-19 NOTE — Patient Instructions (Signed)
Will schedule for home sleep test.  Will call once the results are available.

## 2014-08-19 NOTE — Assessment & Plan Note (Signed)
The patient's history is very suspicious for clinically significant sleep disordered breathing. I have had a long discussion with her about sleep apnea, including its impact to her quality of life and cardiovascular health. I have recommended that she have a sleep study, and thinks that she will be an excellent candidate for home sleep testing. The patient is agreeable to this approach.

## 2014-08-24 ENCOUNTER — Other Ambulatory Visit: Payer: Self-pay | Admitting: Internal Medicine

## 2014-09-14 ENCOUNTER — Other Ambulatory Visit: Payer: Self-pay | Admitting: Internal Medicine

## 2014-09-27 ENCOUNTER — Telehealth: Payer: Self-pay | Admitting: Pulmonary Disease

## 2014-09-27 NOTE — Telephone Encounter (Signed)
Pt had scheduled to pick up Alice HST machine today @3 :30 pm.  Pt called and left voice mail this morning stating that due to finances she was declining having the HST.  I called the pt and advised her that the preauth from [employer] expires 10/18/14 but pt stated she did not feel she would be able to come anytime prior to that to do the HST. I advised pt to call 10/20/14 back when she is able to schedule a time to pick up one of the Walker machines.  We will have to check on preauth at that time to see if [employer] will authorize HST.  Pt voiced understanding.

## 2014-09-27 NOTE — Telephone Encounter (Signed)
Noted  

## 2014-10-07 ENCOUNTER — Other Ambulatory Visit: Payer: Self-pay | Admitting: Internal Medicine

## 2014-10-10 ENCOUNTER — Other Ambulatory Visit: Payer: Self-pay | Admitting: Internal Medicine

## 2014-11-03 ENCOUNTER — Other Ambulatory Visit: Payer: Self-pay | Admitting: Internal Medicine

## 2014-11-29 ENCOUNTER — Other Ambulatory Visit: Payer: Self-pay

## 2014-11-29 NOTE — Telephone Encounter (Signed)
Pt left v/m requesting refill clonazepam to [facility]; Nicki Reaper NP has not filled before.Please advise.

## 2014-11-30 MED ORDER — CLONAZEPAM 0.5 MG PO TABS: 0.5000 mg | ORAL_TABLET | Freq: Every day | ORAL | Status: DC

## 2014-11-30 NOTE — Telephone Encounter (Signed)
Ok to phone in Clonazepam 

## 2014-11-30 NOTE — Telephone Encounter (Signed)
Rx called in to pharmacy. 

## 2014-12-16 ENCOUNTER — Encounter: Payer: Self-pay | Admitting: Internal Medicine

## 2014-12-16 ENCOUNTER — Ambulatory Visit (INDEPENDENT_AMBULATORY_CARE_PROVIDER_SITE_OTHER): Payer: BC Managed Care – PPO | Admitting: Internal Medicine

## 2014-12-16 VITALS — BP 134/90 | HR 74 | Temp 97.9°F | Wt 245.8 lb

## 2014-12-16 DIAGNOSIS — N3010 Interstitial cystitis (chronic) without hematuria: Secondary | ICD-10-CM | POA: Diagnosis not present

## 2014-12-16 DIAGNOSIS — R300 Dysuria: Secondary | ICD-10-CM | POA: Diagnosis not present

## 2014-12-16 LAB — POCT URINALYSIS DIPSTICK
Bilirubin, UA: NEGATIVE
Glucose, UA: NEGATIVE
KETONES UA: NEGATIVE
Nitrite, UA: NEGATIVE
Protein, UA: NEGATIVE
RBC UA: NEGATIVE
Urobilinogen, UA: 0.2
pH, UA: 6

## 2014-12-16 MED ORDER — CIPROFLOXACIN HCL 500 MG PO TABS: 500.0000 mg | ORAL_TABLET | Freq: Two times a day (BID) | ORAL | Status: DC

## 2014-12-16 NOTE — Progress Notes (Signed)
Pre visit review using our clinic review tool, if applicable. No additional management support is needed unless otherwise documented below in the visit note. 

## 2014-12-16 NOTE — Patient Instructions (Signed)

## 2014-12-16 NOTE — Progress Notes (Signed)
HPI  Pt presents to the clinic today with c/o dysuria. This started > 1 week ago. She has had some associated urgency and abdominal cramping. She has had some nausea but denies fever, chills, vomiting or low back pain. It is worse with drinking too much soda or eating more chocolate. She has taken AZO with some relief. She does have a history of interstitial cystitis and urinary incontinence. She denies vaginal complaints.   Review of Systems  Past Medical History  Diagnosis Date  . Asthma   . Chicken pox   . Hyperlipidemia   . Allergy   . IBS (irritable bowel syndrome)   . Frequent headaches   . Interstitial cystitis   . Urine incontinence     Family History  Problem Relation Age of Onset  . COPD Mother   . Heart disease Mother   . Polymyalgia rheumatica Mother   . Emphysema Mother   . Stroke Mother   . Heart disease Father   . Hypertension Father   . Cancer Paternal Aunt     Breast  . Stroke Maternal Grandmother   . Cancer Maternal Aunt     History   Social History  . Marital Status: Married    Spouse Name: N/A  . Number of Children: y  . Years of Education: N/A   Occupational History  . tacher assistant    Social History Main Topics  . Smoking status: Never Smoker   . Smokeless tobacco: Never Used  . Alcohol Use: 0.0 oz/week    0 Standard drinks or equivalent per week     Comment: rare--wine  . Drug Use: No  . Sexual Activity: Not on file   Other Topics Concern  . Not on file   Social History Narrative    Allergies  Allergen Reactions  . Sulfa Antibiotics Hives    Constitutional: Denies fever, malaise, fatigue, headache or abrupt weight changes.   GU: Pt reports urgency and pain with urination. Denies burning sensation, blood in urine, odor or discharge. Skin: Denies redness, rashes, lesions or ulcercations.   No other specific complaints in a complete review of systems (except as listed in HPI above).    Objective:   Physical Exam  BP  134/90 mmHg  Pulse 74  Temp(Src) 97.9 F (36.6 C) (Oral)  Wt 245 lb 12 oz (111.471 kg)  SpO2 97%  LMP 11/25/2014 (Within Weeks) Wt Readings from Last 3 Encounters:  12/16/14 245 lb 12 oz (111.471 kg)  08/19/14 243 lb (110.224 kg)  07/18/14 242 lb (109.77 kg)    General: Appears her stated age, obese in NAD. Cardiovascular: Normal rate and rhythm. S1,S2 noted.  No murmur, rubs or gallops noted.  Pulmonary/Chest: Normal effort and positive vesicular breath sounds. No respiratory distress. No wheezes, rales or ronchi noted.  Abdomen: Soft and nontender. Normal bowel sounds, no bruits noted. No distention or masses noted.  No CVA tenderness.      Assessment & Plan:   Urgency, Dysuria secondary to IC  Urinalysis: 1+ leuks Will send urine culture Avoid caffeine and chocolate OK to take AZO OTC Drink plenty of fluids  RTC as needed or if symptoms persist.

## 2014-12-17 LAB — URINE CULTURE: Colony Count: 6000

## 2014-12-20 ENCOUNTER — Telehealth: Payer: Self-pay | Admitting: Pulmonary Disease

## 2014-12-20 NOTE — Telephone Encounter (Signed)
Home Sleep Test order put in on 08/19/2014. LVM for patient 1 time and HST was scheduled 1 time with a no show

## 2015-01-26 ENCOUNTER — Other Ambulatory Visit: Payer: Self-pay | Admitting: Internal Medicine

## 2015-02-27 ENCOUNTER — Encounter: Payer: Self-pay | Admitting: Internal Medicine

## 2015-02-27 ENCOUNTER — Ambulatory Visit (INDEPENDENT_AMBULATORY_CARE_PROVIDER_SITE_OTHER): Payer: BC Managed Care – PPO | Admitting: Internal Medicine

## 2015-02-27 VITALS — BP 120/72 | HR 81 | Temp 97.8°F | Wt 239.0 lb

## 2015-02-27 DIAGNOSIS — J4530 Mild persistent asthma, uncomplicated: Secondary | ICD-10-CM | POA: Diagnosis not present

## 2015-02-27 DIAGNOSIS — J069 Acute upper respiratory infection, unspecified: Secondary | ICD-10-CM | POA: Diagnosis not present

## 2015-02-27 MED ORDER — FLUTICASONE-SALMETEROL 250-50 MCG/DOSE IN AEPB: 1.0000 | INHALATION_SPRAY | Freq: Every day | RESPIRATORY_TRACT | Status: DC

## 2015-02-27 MED ORDER — MONTELUKAST SODIUM 10 MG PO TABS: 10.0000 mg | ORAL_TABLET | Freq: Every day | ORAL | Status: DC

## 2015-02-27 MED ORDER — AZITHROMYCIN 250 MG PO TABS: ORAL_TABLET | ORAL | Status: DC

## 2015-02-27 NOTE — Patient Instructions (Signed)

## 2015-02-27 NOTE — Progress Notes (Signed)
Pre visit review using our clinic review tool, if applicable. No additional management support is needed unless otherwise documented below in the visit note. 

## 2015-02-27 NOTE — Progress Notes (Signed)
HPI  Pt presents to the clinic today with c/o nasal congestion, cough and chest congestion. This started 1 week ago. The cough is non productive. She is blowing green mucous out of her nose. She denies fever, chills or body aches. She has taken Mucinex, Singulair and her Advair inhaler with some relief. She does have a history of allergies and asthma. 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 She does not smoke. She does request a refill of her Singulair and Advair today.  Review of Systems      Past Medical History  Diagnosis Date  . Asthma   . Chicken pox   . Hyperlipidemia   . Allergy   . IBS (irritable bowel syndrome)   . Frequent headaches   . Interstitial cystitis   . Urine incontinence     Family History  Problem Relation Age of Onset  . COPD Mother   . Heart disease Mother   . Polymyalgia rheumatica Mother   . Emphysema Mother   . Stroke Mother   . Heart disease Father   . Hypertension Father   . Cancer Paternal Aunt     Breast  . Stroke Maternal Grandmother   . Cancer Maternal Aunt     Social History   Social History  . Marital Status: Married    Spouse Name: N/A  . Number of Children: y  . Years of Education: N/A   Occupational History  . tacher assistant    Social History Main Topics  . Smoking status: Never Smoker   . Smokeless tobacco: Never Used  . Alcohol Use: 0.0 oz/week    0 Standard drinks or equivalent per week     Comment: rare--wine  . Drug Use: No  . Sexual Activity: Not on file   Other Topics Concern  . Not on file   Social History Narrative    Allergies  Allergen Reactions  . Sulfa Antibiotics Hives     Constitutional: Positive headache. Denies fatigue, fever or abrupt weight changes.  HEENT:  Positive nasal congestion, sore throat. Denies eye redness, eye pain, pressure behind the eyes, facial pain, ear pain, ringing in the ears, wax buildup, runny nose or bloody nose. Respiratory:  Positive cough. Denies difficulty breathing or shortness of breath.  Cardiovascular: Denies chest pain, chest tightness, palpitations or swelling in the hands or feet.   No other specific complaints in a complete review of systems (except as listed in HPI above).  Objective:  BP 120/72 mmHg  Pulse 81  Temp(Src) 97.8 F (36.6 C) (Oral)  Wt 239 lb (108.41 kg)  SpO2 98%  Wt Readings from Last 3 Encounters:  12/16/14 245 lb 12 oz (111.471 kg)  08/19/14 243 lb (110.224 kg)  07/18/14 242 lb (109.77 kg)     General: Appears her stated age, well developed, well nourished in NAD. HEENT: Head: normal shape and size, no sinus tenderness noted; Eyes: sclera white, no icterus, conjunctiva pink; Ears: Tm's gray and intact, normal light reflex; Throat/Mouth: + PND. Teeth present, mucosa erythematous and moist, no exudate noted, no lesions or ulcerations noted.  Neck: No cervical lymphadenopathy.  Cardiovascular: Normal rate and rhythm. S1,S2 noted.  No murmur, rubs or gallops noted.  Pulmonary/Chest: Normal effort and positive vesicular breath sounds. No respiratory distress. No wheezes, rales or ronchi noted.      Assessment & Plan:   Upper Respiratory Infection:  Get some rest and drink plenty of water  Do salt water gargles for the sore throat eRx for Azithromax x 5 days Delsym OTC for cough Continue inhalers  RTC as needed or if symptoms persist.

## 2015-04-02 ENCOUNTER — Other Ambulatory Visit: Payer: Self-pay | Admitting: Internal Medicine

## 2015-04-03 NOTE — Telephone Encounter (Signed)
Last filled 11/2014--please advise 

## 2015-04-03 NOTE — Telephone Encounter (Signed)
Ok to phone in Clonazepam 

## 2015-04-04 NOTE — Telephone Encounter (Signed)
Rx called in to pharmacy. 

## 2015-04-28 ENCOUNTER — Other Ambulatory Visit: Payer: Self-pay | Admitting: Internal Medicine

## 2015-05-12 ENCOUNTER — Other Ambulatory Visit: Payer: Self-pay | Admitting: Internal Medicine

## 2015-05-12 NOTE — Telephone Encounter (Signed)
Last filled 04/03/2015--please advise 

## 2015-05-12 NOTE — Telephone Encounter (Signed)
Rx called in to pharmacy. 

## 2015-05-12 NOTE — Telephone Encounter (Signed)
Ok to phone in clonazepam 

## 2015-06-25 ENCOUNTER — Other Ambulatory Visit: Payer: Self-pay | Admitting: Internal Medicine

## 2015-07-01 ENCOUNTER — Other Ambulatory Visit: Payer: Self-pay | Admitting: Internal Medicine

## 2015-07-03 NOTE — Telephone Encounter (Signed)
Ok to phone in Clonazepam 

## 2015-07-03 NOTE — Telephone Encounter (Signed)
Last filled 05/12/2015--please advise

## 2015-07-03 NOTE — Telephone Encounter (Signed)
Rx called in to pharmacy. 

## 2015-07-17 ENCOUNTER — Other Ambulatory Visit: Payer: Self-pay | Admitting: Internal Medicine

## 2015-07-30 ENCOUNTER — Other Ambulatory Visit: Payer: Self-pay | Admitting: Internal Medicine

## 2015-08-04 ENCOUNTER — Telehealth: Payer: Self-pay | Admitting: Internal Medicine

## 2015-08-04 NOTE — Telephone Encounter (Signed)
[facility] Day - Client TELEPHONE ADVICE RECORD [facility]  Patient Name: [name]  DOB: 27-Aug-1965    Initial Comment Caller states that she thinks that she might be getting the flu. She has had loose stools, body aches, and a fever with sore throat.   Nurse Assessment  Nurse: Leone Haven, RN, Susie Date/Time (Eastern Time): 08/04/2015 10:44:28 AM  Confirm and document reason for call. If symptomatic, describe symptoms. You must click the next button to save text entered. ---states that she thinks that she might be getting the flu. She has had loose stools this week and has IBS may be related to that; body aches, headache and located frontal and pain in eyes; fever - 99.5; with scratchy throat, frequent clearing; cough is nonproductive, slight pressure in chest as related to cough;  Has the patient traveled out of the country within the last 30 days? ---Not Applicable  Does the patient have any new or worsening symptoms? ---Yes  Will a triage be completed? ---Yes  Related visit to physician within the last 2 weeks? ---N/A  Does the PT have any chronic conditions? (i.e. diabetes, asthma, etc.) ---Yes  List chronic conditions. ---Depression; IBS;  Is the patient pregnant or possibly pregnant? (Ask all females between the ages of 40-55) ---No  Is this a behavioral health or substance abuse call? ---No     Guidelines    Guideline Title Affirmed Question Affirmed Notes  Influenza - Seasonal [1] Using nasal washes and pain medicine > 24 hours AND [2] sinus pain (around cheekbone or eye) persists    Final Disposition User   See Physician within 24 Hours Wisdom, RN, Susie    Comments  [facility] - caller states she missed nurses cb - nurse notified.  referred to telehealth;   Referrals  REFERRED TO PCP OFFICE   Disagree/Comply: Comply

## 2015-08-04 NOTE — Telephone Encounter (Signed)
Spoke with pt and she is going to do an online visit or will be seen at [facility]; pt is going to speak with her husband to decide which avenue she will go.

## 2015-08-08 ENCOUNTER — Encounter: Payer: Self-pay | Admitting: Internal Medicine

## 2015-08-08 ENCOUNTER — Ambulatory Visit (INDEPENDENT_AMBULATORY_CARE_PROVIDER_SITE_OTHER): Payer: BC Managed Care – PPO | Admitting: Internal Medicine

## 2015-08-08 VITALS — BP 120/74 | HR 77 | Temp 98.0°F | Wt 236.0 lb

## 2015-08-08 DIAGNOSIS — J4530 Mild persistent asthma, uncomplicated: Secondary | ICD-10-CM

## 2015-08-08 DIAGNOSIS — J069 Acute upper respiratory infection, unspecified: Secondary | ICD-10-CM

## 2015-08-08 DIAGNOSIS — B9789 Other viral agents as the cause of diseases classified elsewhere: Principal | ICD-10-CM

## 2015-08-08 MED ORDER — FLUTICASONE-SALMETEROL 250-50 MCG/DOSE IN AEPB: 1.0000 | INHALATION_SPRAY | Freq: Every day | RESPIRATORY_TRACT | Status: DC

## 2015-08-08 MED ORDER — ALBUTEROL SULFATE HFA 108 (90 BASE) MCG/ACT IN AERS: 1.0000 | INHALATION_SPRAY | Freq: Four times a day (QID) | RESPIRATORY_TRACT | Status: DC | PRN

## 2015-08-08 NOTE — Progress Notes (Signed)
HPI  Pt presents to the clinic today with c/o nasal congestion, cough and chest congestion. This started 4 days ago. She is blowing clear mucous out of her nose. The cough is productive of clear mucous. She reports she ran low grade fevers over the weekend. She went to [facility] over the weekend and was given a RX for cough syrup, advised to take Zyrtec. She did test negative for the flu. She reports she has not been taking the medication, but she has tried Tylenol. She does have a history of allergy induced asthma. She takes Singulair and Advair as prescribed. She has had sick contacts. She did not get her flu shot.  She does need a refill on her Advair and Albuterol today. \ Review of Systems    Past Medical History  Diagnosis Date  . Asthma   . Chicken pox   . Hyperlipidemia   . Allergy   . IBS (irritable bowel syndrome)   . Frequent headaches   . Interstitial cystitis   . Urine incontinence     Family History  Problem Relation Age of Onset  . COPD Mother   . Heart disease Mother   . Polymyalgia rheumatica Mother   . Emphysema Mother   . Stroke Mother   . Heart disease Father   . Hypertension Father   . Cancer Paternal Aunt     Breast  . Stroke Maternal Grandmother   . Cancer Maternal Aunt     Social History   Social History  . Marital Status: Married    Spouse Name: N/A  . Number of Children: y  . Years of Education: N/A   Occupational History  . tacher assistant    Social History Main Topics  . Smoking status: Never Smoker   . Smokeless tobacco: Never Used  . Alcohol Use: 0.0 oz/week    0 Standard drinks or equivalent per week     Comment: rare--wine  . Drug Use: No  . Sexual Activity: Not on file   Other Topics Concern  . Not on file   Social History Narrative    Allergies  Allergen Reactions  . Sulfa Antibiotics Hives     Constitutional: Positive fatigue and fever. Denies headache, abrupt weight changes.  HEENT:  Positive nasal congestion and sore  throat. Denies eye redness, ear pain, ringing in the ears, wax buildup, runny nose or bloody nose. Respiratory: Positive cough. Denies difficulty breathing or shortness of breath.  Cardiovascular: Denies chest pain, chest tightness, palpitations or swelling in the hands or feet.   No other specific complaints in a complete review of systems (except as listed in HPI above).  Objective:  BP 120/74 mmHg  Pulse 77  Temp(Src) 98 F (36.7 C) (Oral)  Wt 236 lb (107.049 kg)  SpO2 97%   General: Appears herstated age,  in NAD. HEENT: Head: normal shape and size, no sinus tenderness noted; Eyes: sclera white, no icterus, conjunctiva pink; Ears: bilateral cerumen impaction; Nose: mucosa boggy and moist, septum midline; Throat/Mouth: + PND. Teeth present, mucosa pink and moist, no exudate noted, no lesions or ulcerations noted.  Neck:  No masses, lumps or thyromegaly present.  Cardiovascular: Normal rate and rhythm. S1,S2 noted.  No murmur, rubs or gallops noted.  Pulmonary/Chest: Normal effort and positive vesicular breath sounds. No respiratory distress. No wheezes, rales or ronchi noted.      Assessment & Plan:   Viral URI with Cough  Get some rest and drink plenty of fluids Salt water  gargles for sore throat Tylenol or Ibuprofen for fevers Advised her to start Zyrtec and cough syrup given to her by [facility] Work note provided  RTC as needed or if symptoms persist and worsen.

## 2015-08-08 NOTE — Patient Instructions (Signed)
Upper Respiratory Infection, Adult Most upper respiratory infections (URIs) are a viral infection of the air passages leading to the lungs. A URI affects the nose, throat, and upper air passages. The most common type of URI is nasopharyngitis and is typically referred to as "the common cold." URIs run their course and usually go away on their own. Most of the time, a URI does not require medical attention, but sometimes a bacterial infection in the upper airways can follow a viral infection. This is called a secondary infection. Sinus and middle ear infections are common types of secondary upper respiratory infections. Bacterial pneumonia can also complicate a URI. A URI can worsen asthma and chronic obstructive pulmonary disease (COPD). Sometimes, these complications can require emergency medical care and may be life threatening.  CAUSES Almost all URIs are caused by viruses. A virus is a type of germ and can spread from one person to another.  RISKS FACTORS You may be at risk for a URI if:   You smoke.   You have chronic heart or lung disease.  You have a weakened defense (immune) system.   You are very young or very old.   You have nasal allergies or asthma.  You work in crowded or poorly ventilated areas.  You work in health care facilities or schools. SIGNS AND SYMPTOMS  Symptoms typically develop 2-3 days after you come in contact with a cold virus. Most viral URIs last 7-10 days. However, viral URIs from the influenza virus (flu virus) can last 14-18 days and are typically more severe. Symptoms may include:   Runny or stuffy (congested) nose.   Sneezing.   Cough.   Sore throat.   Headache.   Fatigue.   Fever.   Loss of appetite.   Pain in your forehead, behind your eyes, and over your cheekbones (sinus pain).  Muscle aches.  DIAGNOSIS  Your health care provider may diagnose a URI by:  Physical exam.  Tests to check that your symptoms are not due to  another condition such as:  Strep throat.  Sinusitis.  Pneumonia.  Asthma. TREATMENT  A URI goes away on its own with time. It cannot be cured with medicines, but medicines may be prescribed or recommended to relieve symptoms. Medicines may help:  Reduce your fever.  Reduce your cough.  Relieve nasal congestion. HOME CARE INSTRUCTIONS   Take medicines only as directed by your health care provider.   Gargle warm saltwater or take cough drops to comfort your throat as directed by your health care provider.  Use a warm mist humidifier or inhale steam from a shower to increase air moisture. This may make it easier to breathe.  Drink enough fluid to keep your urine clear or pale yellow.   Eat soups and other clear broths and maintain good nutrition.   Rest as needed.   Return to work when your temperature has returned to normal or as your health care provider advises. You may need to stay home longer to avoid infecting others. You can also use a face mask and careful hand washing to prevent spread of the virus.  Increase the usage of your inhaler if you have asthma.   Do not use any tobacco products, including cigarettes, chewing tobacco, or electronic cigarettes. If you need help quitting, ask your health care provider. PREVENTION  The best way to protect yourself from getting a cold is to practice good hygiene.   Avoid oral or hand contact with people with cold   symptoms.   Wash your hands often if contact occurs.  There is no clear evidence that vitamin C, vitamin E, echinacea, or exercise reduces the chance of developing a cold. However, it is always recommended to get plenty of rest, exercise, and practice good nutrition.  SEEK MEDICAL CARE IF:   You are getting worse rather than better.   Your symptoms are not controlled by medicine.   You have chills.  You have worsening shortness of breath.  You have brown or red mucus.  You have yellow or brown nasal  discharge.  You have pain in your face, especially when you bend forward.  You have a fever.  You have swollen neck glands.  You have pain while swallowing.  You have white areas in the back of your throat. SEEK IMMEDIATE MEDICAL CARE IF:   You have severe or persistent:  Headache.  Ear pain.  Sinus pain.  Chest pain.  You have chronic lung disease and any of the following:  Wheezing.  Prolonged cough.  Coughing up blood.  A change in your usual mucus.  You have a stiff neck.  You have changes in your:  Vision.  Hearing.  Thinking.  Mood. MAKE SURE YOU:   Understand these instructions.  Will watch your condition.  Will get help right away if you are not doing well or get worse.   This information is not intended to replace advice given to you by your health care provider. Make sure you discuss any questions you have with your health care provider.   Document Released: 10/30/2000 Document Revised: 09/20/2014 Document Reviewed: 08/11/2013 Elsevier Interactive Patient Education 2016 Elsevier Inc.  

## 2015-08-12 ENCOUNTER — Other Ambulatory Visit: Payer: Self-pay | Admitting: Internal Medicine

## 2015-08-14 NOTE — Telephone Encounter (Signed)
Rx called in to pharmacy. 

## 2015-08-14 NOTE — Telephone Encounter (Signed)
Last filled 07/03/15--please advise

## 2015-08-14 NOTE — Telephone Encounter (Signed)
Ok to phone in Clonazepam 

## 2015-08-17 ENCOUNTER — Encounter: Payer: Self-pay | Admitting: Internal Medicine

## 2015-08-17 ENCOUNTER — Ambulatory Visit (INDEPENDENT_AMBULATORY_CARE_PROVIDER_SITE_OTHER): Payer: BC Managed Care – PPO | Admitting: Internal Medicine

## 2015-08-17 VITALS — BP 104/70 | HR 70 | Temp 97.3°F | Resp 12 | Wt 243.0 lb

## 2015-08-17 DIAGNOSIS — J0140 Acute pansinusitis, unspecified: Secondary | ICD-10-CM | POA: Diagnosis not present

## 2015-08-17 MED ORDER — AMOXICILLIN 500 MG PO TABS: 1000.0000 mg | ORAL_TABLET | Freq: Two times a day (BID) | ORAL | Status: DC

## 2015-08-17 NOTE — Progress Notes (Signed)
Pre visit review using our clinic review tool, if applicable. No additional management support is needed unless otherwise documented below in the visit note. 

## 2015-08-17 NOTE — Assessment & Plan Note (Signed)
Now clearly seems to have secondary bacterial infection Discussed symptom relief Amoxil--- change to augmentin next week prn  No evidence of UTI

## 2015-08-17 NOTE — Progress Notes (Signed)
Subjective:    Patient ID: Donna Vasquez, female    DOB: [birth year], 50 y.o.   MRN: [id number]  HPI Here due to ongoing respiratory infection  Having dry cough Some AM sputum--- swallows Green nasal drainage with blowing---all day long No wheezing but feels tight in her upper chest Some sore throat and ear pain Head fullness--- frontal and now maxillary No fever Some chills at night--no sig sweats Some SOB--with activity  mucinex severe cold and cough med at night (helps sleep)  Current Outpatient Prescriptions on File Prior to Visit  Medication Sig Dispense Refill  . albuterol (PROAIR HFA) 108 (90 Base) MCG/ACT inhaler Inhale 1-2 puffs into the lungs every 6 (six) hours as needed for wheezing or shortness of breath. 1 Inhaler 2  . aspirin 81 MG tablet Take 81 mg by mouth daily.    . brompheniramine-pseudoephedrine-DM 30-2-10 MG/5ML syrup TAKE 2 TEASPOONSFUL BY MOUTH EVERY 4 TO 6 HOURS AS NEEDED  0  . cetirizine (ZYRTEC) 10 MG tablet Take 10 mg by mouth daily.  0  . Cholecalciferol (VITAMIN D-3) 1000 UNITS CAPS Take 3 capsules by mouth daily. 3000 units    . clonazePAM (KLONOPIN) 0.5 MG tablet TAKE 1 TABLET BY MOUTH EVERY 6 HOURS AS NEEDED 30 tablet 0  . Fluticasone-Salmeterol (ADVAIR) 250-50 MCG/DOSE AEPB Inhale 1 puff into the lungs daily. 60 each 2  . hyoscyamine (LEVSIN, ANASPAZ) 0.125 MG tablet Take 1 tablet (0.125 mg total) by mouth every 6 (six) hours as needed. 30 tablet 2  . montelukast (SINGULAIR) 10 MG tablet Take 1 tablet (10 mg total) by mouth at bedtime. 30 tablet 2  . niacin (NIASPAN) 1000 MG CR tablet Take 1 tablet (1,000 mg total) by mouth at bedtime. MUST SCHEDULE ANNUAL PHYSICAL FOR FURTHER REFILLS 30 tablet 1  . omeprazole (PRILOSEC) 20 MG capsule TAKE ONE CAPSULE BY MOUTH EVERY DAY 30 capsule 2  . sertraline (ZOLOFT) 100 MG tablet TAKE 1 TABLET BY MOUTH DAILY. MUST SCHEDULE ANNUAL PHYSICAL FOR MORE REFILLS 30 tablet 1  . simvastatin (ZOCOR) 20 MG tablet  TAKE 1 TABLET (20 MG TOTAL) BY MOUTH AT BEDTIME. 30 tablet 5   No current facility-administered medications on file prior to visit.    Allergies  Allergen Reactions  . Sulfa Antibiotics Hives    Past Medical History  Diagnosis Date  . Asthma   . Chicken pox   . Hyperlipidemia   . Allergy   . IBS (irritable bowel syndrome)   . Frequent headaches   . Interstitial cystitis   . Urine incontinence     Past Surgical History  Procedure Laterality Date  . Cholecystectomy    . Tubal ligation  1999  . Nasal sinus surgery    . Cyst removed from back      Family History  Problem Relation Age of Onset  . COPD Mother   . Heart disease Mother   . Polymyalgia rheumatica Mother   . Emphysema Mother   . Stroke Mother   . Heart disease Father   . Hypertension Father   . Cancer Paternal Aunt     Breast  . Stroke Maternal Grandmother   . Cancer Maternal Aunt     Social History   Social History  . Marital Status: Married    Spouse Name: N/A  . Number of Children: y  . Years of Education: N/A   Occupational History  . tacher assistant    Social History Main Topics  . Smoking status:  Never Smoker   . Smokeless tobacco: Never Used  . Alcohol Use: 0.0 oz/week    0 Standard drinks or equivalent per week     Comment: rare--wine  . Drug Use: No  . Sexual Activity: Not on file   Other Topics Concern  . Not on file   Social History Narrative   Review of Systems  Has IC--- diagnosed 20 years ago. Notices urinary leakage with the cough now. Today noted darker liquid on her pad. Looked like beginning of period No dysuria Rash on forehead for 2 weeks--?related to soap? No vomiting Some nausea and loose stools with this illness--now better Appetite is not great--taste is off (but can eat)     Objective:   Physical Exam  Constitutional: Donna Vasquez appears well-developed and well-nourished. No distress.  Looks mildly uncomfortable  HENT:  Mouth/Throat: No oropharyngeal exudate.   No sinus tenderness--actually feels better with the pressure Moderate nasal inflammation Slight pharyngeal injection only  Neck: Normal range of motion. Neck supple. No thyromegaly present.  Pulmonary/Chest: Effort normal and breath sounds normal. No respiratory distress. Donna Vasquez has no wheezes.  Abdominal: Soft. There is no tenderness.          Assessment & Plan:

## 2015-08-18 ENCOUNTER — Telehealth: Payer: Self-pay | Admitting: *Deleted

## 2015-08-18 ENCOUNTER — Other Ambulatory Visit: Payer: Self-pay | Admitting: Internal Medicine

## 2015-08-18 MED ORDER — FLUCONAZOLE 150 MG PO TABS: 150.0000 mg | ORAL_TABLET | Freq: Once | ORAL | Status: DC

## 2015-08-18 MED ORDER — TRIAMCINOLONE ACETONIDE 0.1 % EX CREA: 1.0000 "application " | TOPICAL_CREAM | Freq: Two times a day (BID) | CUTANEOUS | Status: DC | PRN

## 2015-08-18 NOTE — Telephone Encounter (Signed)
Pt contacted office and states she was recently and given an abx. Pt is requesting a Rx for Diflucan as she is prone to yeast infections

## 2015-08-18 NOTE — Telephone Encounter (Signed)
Forgot that too. I sent it in now--along with the cream for her rash on forehead Please let her know

## 2015-08-18 NOTE — Telephone Encounter (Signed)
Left detailed message on voice mail since she had a DPR on file and it said her name

## 2015-08-18 NOTE — Addendum Note (Signed)
Addended by: Tillman Abide I on: 08/18/2015 09:19 AM   Modules accepted: Orders

## 2015-09-21 ENCOUNTER — Other Ambulatory Visit: Payer: Self-pay | Admitting: Internal Medicine

## 2015-09-22 NOTE — Telephone Encounter (Signed)
Klonopin last filled 08/14/2015--please advise

## 2015-09-23 NOTE — Telephone Encounter (Signed)
Ok to phone in Clonazepam. Zoloft sent electronically

## 2015-09-25 NOTE — Telephone Encounter (Signed)
Rx called in to pharmacy. 

## 2015-09-27 ENCOUNTER — Other Ambulatory Visit: Payer: Self-pay | Admitting: Internal Medicine

## 2015-10-03 ENCOUNTER — Other Ambulatory Visit: Payer: Self-pay | Admitting: Internal Medicine

## 2015-10-31 ENCOUNTER — Other Ambulatory Visit: Payer: Self-pay | Admitting: Internal Medicine

## 2015-11-06 DIAGNOSIS — K582 Mixed irritable bowel syndrome: Secondary | ICD-10-CM | POA: Diagnosis not present

## 2015-11-06 DIAGNOSIS — Z8371 Family history of colonic polyps: Secondary | ICD-10-CM | POA: Diagnosis not present

## 2015-11-13 ENCOUNTER — Encounter: Payer: Self-pay | Admitting: Internal Medicine

## 2015-11-13 ENCOUNTER — Ambulatory Visit (INDEPENDENT_AMBULATORY_CARE_PROVIDER_SITE_OTHER): Payer: BC Managed Care – PPO | Admitting: Internal Medicine

## 2015-11-13 VITALS — BP 106/80 | HR 74 | Ht 67.5 in | Wt 238.9 lb

## 2015-11-13 DIAGNOSIS — L600 Ingrowing nail: Secondary | ICD-10-CM | POA: Diagnosis not present

## 2015-11-13 DIAGNOSIS — B079 Viral wart, unspecified: Secondary | ICD-10-CM | POA: Diagnosis not present

## 2015-11-13 DIAGNOSIS — K219 Gastro-esophageal reflux disease without esophagitis: Secondary | ICD-10-CM | POA: Diagnosis not present

## 2015-11-13 DIAGNOSIS — J4530 Mild persistent asthma, uncomplicated: Secondary | ICD-10-CM

## 2015-11-13 DIAGNOSIS — Z0001 Encounter for general adult medical examination with abnormal findings: Secondary | ICD-10-CM | POA: Diagnosis not present

## 2015-11-13 DIAGNOSIS — E785 Hyperlipidemia, unspecified: Secondary | ICD-10-CM

## 2015-11-13 DIAGNOSIS — G4700 Insomnia, unspecified: Secondary | ICD-10-CM

## 2015-11-13 DIAGNOSIS — K589 Irritable bowel syndrome without diarrhea: Secondary | ICD-10-CM

## 2015-11-13 LAB — CBC
HEMATOCRIT: 40.4 % (ref 36.0–46.0)
Hemoglobin: 13.7 g/dL (ref 12.0–15.0)
MCHC: 34 g/dL (ref 30.0–36.0)
MCV: 90 fl (ref 78.0–100.0)
Platelets: 253 10*3/uL (ref 150.0–400.0)
RBC: 4.49 Mil/uL (ref 3.87–5.11)
RDW: 12.7 % (ref 11.5–15.5)
WBC: 8.6 10*3/uL (ref 4.0–10.5)

## 2015-11-13 LAB — COMPREHENSIVE METABOLIC PANEL
ALK PHOS: 79 U/L (ref 39–117)
ALT: 18 U/L (ref 0–35)
AST: 17 U/L (ref 0–37)
Albumin: 4.3 g/dL (ref 3.5–5.2)
BUN: 17 mg/dL (ref 6–23)
CHLORIDE: 103 meq/L (ref 96–112)
CO2: 31 meq/L (ref 19–32)
Calcium: 9.5 mg/dL (ref 8.4–10.5)
Creatinine, Ser: 0.91 mg/dL (ref 0.40–1.20)
GFR: 69.23 mL/min (ref >60.00)
GLUCOSE: 93 mg/dL (ref 70–99)
POTASSIUM: 4.4 meq/L (ref 3.5–5.1)
SODIUM: 138 meq/L (ref 135–145)
Total Bilirubin: 0.4 mg/dL (ref 0.2–1.2)
Total Protein: 7.1 g/dL (ref 6.0–8.3)

## 2015-11-13 LAB — LIPID PANEL
CHOL/HDL RATIO: 3
Cholesterol: 183 mg/dL (ref 0–200)
HDL: 54.2 mg/dL (ref >39.00)
LDL CALC: 99 mg/dL (ref 0–99)
NONHDL: 128.39
Triglycerides: 149 mg/dL (ref 0.0–149.0)
VLDL: 29.8 mg/dL (ref 0.0–40.0)

## 2015-11-13 LAB — HEMOGLOBIN A1C: Hgb A1c MFr Bld: 5 % (ref 4.6–6.5)

## 2015-11-13 LAB — TSH: TSH: 2.12 u[IU]/mL (ref 0.35–4.50)

## 2015-11-13 MED ORDER — OMEPRAZOLE 20 MG PO CPDR: 20.0000 mg | DELAYED_RELEASE_CAPSULE | Freq: Every day | ORAL | Status: DC

## 2015-11-13 NOTE — Assessment & Plan Note (Signed)
Continue Zoloft and Levsin

## 2015-11-13 NOTE — Progress Notes (Signed)
HPI  Pt presents to the clinic today for her annual exam. She is also due to follow up chronic conditions.   Flu: 2014 Tetanus: < 10 years ago LMP: post menopausal Pap Smear: 06/2014, scheduled next month Mammogram: 2013, scheduled next month Colon Screening: Dr. Mechele Collin, scheduled next month Vision Screening: 03/2014 Dentist: Madilyn Fireman  Allergy induced asthma: Mild persisent. Usually well controlled on Advair, Singulair and Zyrtrec. She never really has to use her Albuterol inhaler.  HLD: No lipid profile on file. Denies myalgias on Zocor and Niacin. She tries to maintain a low fat diet.  IBS: Stable recently. She takes Zoloft daily and Levsin only as needed. She has loose stool occasionally.  Insomnia: Takes 1/2 clonazepam nightly for this. The vivid dreams are less.  GERD: Denies symptoms on current dose of Prilosec. She does not have breakthrough symptoms. She does request a refill of this today.  Subjective:  Past Medical History  Diagnosis Date  . Asthma   . Chicken pox   . Hyperlipidemia   . Allergy   . IBS (irritable bowel syndrome)   . Frequent headaches   . Interstitial cystitis   . Urine incontinence     Current Outpatient Prescriptions  Medication Sig Dispense Refill  . albuterol (PROAIR HFA) 108 (90 Base) MCG/ACT inhaler Inhale 1-2 puffs into the lungs every 6 (six) hours as needed for wheezing or shortness of breath. 1 Inhaler 2  . aspirin 81 MG tablet Take 81 mg by mouth daily.    . cetirizine (ZYRTEC) 10 MG tablet Take 10 mg by mouth daily.  0  . Cholecalciferol (VITAMIN D-3) 1000 UNITS CAPS Take 3 capsules by mouth daily. 3000 units    . clonazePAM (KLONOPIN) 0.5 MG tablet TAKE 1 TABLET BY MOUTH EVERY 6 HOURS AS NEEDED 30 tablet 0  . Fluticasone-Salmeterol (ADVAIR) 250-50 MCG/DOSE AEPB Inhale 1 puff into the lungs daily. 60 each 2  . hyoscyamine (LEVSIN, ANASPAZ) 0.125 MG tablet Take 1 tablet (0.125 mg total) by mouth every 6 (six) hours as needed. 30  tablet 2  . montelukast (SINGULAIR) 10 MG tablet TAKE 1 TABLET (10 MG TOTAL) BY MOUTH AT BEDTIME. 30 tablet 2  . niacin (NIASPAN) 1000 MG CR tablet TAKE 1 TABLET AT BEDTIME 30 tablet 1  . omeprazole (PRILOSEC) 20 MG capsule Take 1 capsule (20 mg total) by mouth daily. 30 capsule 2  . sertraline (ZOLOFT) 100 MG tablet Take 1 tablet (100 mg total) by mouth daily. 30 tablet 0  . simvastatin (ZOCOR) 20 MG tablet TAKE 1 TABLET (20 MG TOTAL) BY MOUTH AT BEDTIME. 30 tablet 1  . triamcinolone cream (KENALOG) 0.1 % Apply 1 application topically 2 (two) times daily as needed. 45 g 1   No current facility-administered medications for this visit.    Allergies  Allergen Reactions  . Sulfa Antibiotics Hives    Family History  Problem Relation Age of Onset  . COPD Mother   . Heart disease Mother   . Polymyalgia rheumatica Mother   . Emphysema Mother   . Stroke Mother   . Heart disease Father   . Hypertension Father   . Cancer Paternal Aunt     Breast  . Stroke Maternal Grandmother   . Cancer Maternal Aunt     Social History   Social History  . Marital Status: Married    Spouse Name: N/A  . Number of Children: y  . Years of Education: N/A   Occupational History  . tacher assistant  Social History Main Topics  . Smoking status: Never Smoker   . Smokeless tobacco: Never Used  . Alcohol Use: 0.0 oz/week    0 Standard drinks or equivalent per week     Comment: rare--wine  . Drug Use: No  . Sexual Activity: Not on file   Other Topics Concern  . Not on file   Social History Narrative     Constitutional: Denies fever, malaise, fatigue, headache or abrupt weight changes.  HEENT: Pt reports blurry vision. Denies eye pain, eye redness, ear pain, ringing in the ears, wax buildup, runny nose, nasal congestion, bloody nose, or sore throat. Respiratory: Denies difficulty breathing, shortness of breath, cough or sputum production.   Cardiovascular: Denies chest pain, chest tightness,  palpitations or swelling in the hands or feet.  Gastrointestinal: Pt reports loose stool. Denies abdominal pain, bloating, constipation, diarrhea or blood in the stool.  GU: Pt reports urine leakage. Denies urgency, frequency, pain with urination, burning sensation, blood in urine, odor or discharge. Musculoskeletal: Denies decrease in range of motion, difficulty with gait, muscle pain or joint pain and swelling.  Skin: Pt reports wart and ingrown toenail of left great toe. Denies redness, rashes, or ulcercations.  Neurological: Denies dizziness, difficulty with memory, difficulty with speech or problems with balance and coordination.  Psych: Denies anxiety, depression, SI/HI.  No other specific complaints in a complete review of systems (except as listed in HPI above).  Objective:  BP 106/80 mmHg  Pulse 74  Ht 5' 7.5" (1.715 m)  Wt 238 lb 14.4 oz (108.364 kg)  BMI 36.84 kg/m2  LMP 10/03/2015 Wt Readings from Last 3 Encounters:  11/13/15 238 lb 14.4 oz (108.364 kg)  08/17/15 243 lb (110.224 kg)  08/08/15 236 lb (107.049 kg)    General: Appears her stated age, obese in NAD. Skin: Warm, dry and intact. Complicated wart noted on nail edge of left great toe. HEENT: Head: normal shape and size; Eyes: sclera white, no icterus, conjunctiva pink, PERRLA and EOMs intact; Ears: Bilateral cerumen impaction. Throat/Mouth: Teeth present, mucosa pink and moist, no exudate, lesions or ulcerations noted.  Neck:  Neck supple, trachea midline. No masses, lumps or thyromegaly present.  Cardiovascular: Normal rate and rhythm. S1,S2 noted.  No murmur, rubs or gallops noted. No JVD or BLE edema. No carotid bruits noted. Pulmonary/Chest: Normal effort and positive vesicular breath sounds. No respiratory distress. No wheezes, rales or ronchi noted.  Abdomen: Soft and nontender. Normal bowel sounds No distention or masses noted. Liver, spleen and kidneys non palpable. Musculoskeletal: Strength 5/5 BUE/BLE. No  signs of joint swelling. No difficulty with gait.  Neurological: Alert and oriented. Cranial nerves II-XII grossly intact. Coordination normal.  Psychiatric: Mood and affect normal. Behavior is normal. Judgment and thought content normal.    BMET    Component Value Date/Time   NA 140 05/26/2014 1631   K 4.3 05/26/2014 1631   CL 106 05/26/2014 1631   CO2 26 05/26/2014 1631   GLUCOSE 88 05/26/2014 1631   BUN 16 05/26/2014 1631   CREATININE 0.7 05/26/2014 1631   CALCIUM 8.9 05/26/2014 1631    Lipid Panel  No results found for: CHOL, TRIG, HDL, CHOLHDL, VLDL, LDLCALC  CBC    Component Value Date/Time   WBC 7.3 05/26/2014 1631   RBC 4.06 05/26/2014 1631   HGB 12.5 05/26/2014 1631   HCT 37.8 05/26/2014 1631   PLT 233.0 05/26/2014 1631   MCV 93.1 05/26/2014 1631   MCHC 33.1 05/26/2014 1631  RDW 13.0 05/26/2014 1631    Hgb A1C No results found for: HGBA1C   Assessment and Plan:  Preventative Health Maintenance:  Encouraged her to get a flu shot in the fall Will request records from [facility] to check HIV and last Tetanus Mammogram and pap smear scheduled Colonoscopy scheduled Encouraged her to consume a balanced diet and start an exercise regimen Encouraged her to see an eye doctor and dentist annually Will check CBC< CMET, Lipid, TSH and A1C today  Wart, ingrown toenail of left foot:  Referral placed to podiatry  RTC in 6 months to follow up chronic conditions

## 2015-11-13 NOTE — Assessment & Plan Note (Signed)
Continue Clonazepam.

## 2015-11-13 NOTE — Assessment & Plan Note (Signed)
Controlled on Zyrtec, Singulair and Advair Albuterol prn

## 2015-11-13 NOTE — Patient Instructions (Signed)
Health Maintenance, Female Adopting a healthy lifestyle and getting preventive care can go a long way to promote health and wellness. Talk with your health care provider about what schedule of regular examinations is right for you. This is a good chance for you to check in with your provider about disease prevention and staying healthy. In between checkups, there are plenty of things you can do on your own. Experts have done a lot of research about which lifestyle changes and preventive measures are most likely to keep you healthy. Ask your health care provider for more information. WEIGHT AND DIET  Eat a healthy diet  Be sure to include plenty of vegetables, fruits, low-fat dairy products, and lean protein.  Do not eat a lot of foods high in solid fats, added sugars, or salt.  Get regular exercise. This is one of the most important things you can do for your health.  Most adults should exercise for at least 150 minutes each week. The exercise should increase your heart rate and make you sweat (moderate-intensity exercise).  Most adults should also do strengthening exercises at least twice a week. This is in addition to the moderate-intensity exercise.  Maintain a healthy weight  Body mass index (BMI) is a measurement that can be used to identify possible weight problems. It estimates body fat based on height and weight. Your health care provider can help determine your BMI and help you achieve or maintain a healthy weight.  For females 20 years of age and older:   A BMI below 18.5 is considered underweight.  A BMI of 18.5 to 24.9 is normal.  A BMI of 25 to 29.9 is considered overweight.  A BMI of 30 and above is considered obese.  Watch levels of cholesterol and blood lipids  You should start having your blood tested for lipids and cholesterol at 51 years of age, then have this test every 5 years.  You may need to have your cholesterol levels checked more often if:  Your lipid  or cholesterol levels are high.  You are older than 50 years of age.  You are at high risk for heart disease.  CANCER SCREENING   Lung Cancer  Lung cancer screening is recommended for adults 55-80 years old who are at high risk for lung cancer because of a history of smoking.  A yearly low-dose CT scan of the lungs is recommended for people who:  Currently smoke.  Have quit within the past 15 years.  Have at least a 30-pack-year history of smoking. A pack year is smoking an average of one pack of cigarettes a day for 1 year.  Yearly screening should continue until it has been 15 years since you quit.  Yearly screening should stop if you develop a health problem that would prevent you from having lung cancer treatment.  Breast Cancer  Practice breast self-awareness. This means understanding how your breasts normally appear and feel.  It also means doing regular breast self-exams. Let your health care provider know about any changes, no matter how small.  If you are in your 20s or 30s, you should have a clinical breast exam (CBE) by a health care provider every 1-3 years as part of a regular health exam.  If you are 40 or older, have a CBE every year. Also consider having a breast X-ray (mammogram) every year.  If you have a family history of breast cancer, talk to your health care provider about genetic screening.  If you   are at high risk for breast cancer, talk to your health care provider about having an MRI and a mammogram every year.  Breast cancer gene (BRCA) assessment is recommended for women who have family members with BRCA-related cancers. BRCA-related cancers include:  Breast.  Ovarian.  Tubal.  Peritoneal cancers.  Results of the assessment will determine the need for genetic counseling and BRCA1 and BRCA2 testing. Cervical Cancer Your health care provider may recommend that you be screened regularly for cancer of the pelvic organs (ovaries, uterus, and  vagina). This screening involves a pelvic examination, including checking for microscopic changes to the surface of your cervix (Pap test). You may be encouraged to have this screening done every 3 years, beginning at age 21.  For women ages 30-65, health care providers may recommend pelvic exams and Pap testing every 3 years, or they may recommend the Pap and pelvic exam, combined with testing for human papilloma virus (HPV), every 5 years. Some types of HPV increase your risk of cervical cancer. Testing for HPV may also be done on women of any age with unclear Pap test results.  Other health care providers may not recommend any screening for nonpregnant women who are considered low risk for pelvic cancer and who do not have symptoms. Ask your health care provider if a screening pelvic exam is right for you.  If you have had past treatment for cervical cancer or a condition that could lead to cancer, you need Pap tests and screening for cancer for at least 20 years after your treatment. If Pap tests have been discontinued, your risk factors (such as having a new sexual partner) need to be reassessed to determine if screening should resume. Some women have medical problems that increase the chance of getting cervical cancer. In these cases, your health care provider may recommend more frequent screening and Pap tests. Colorectal Cancer  This type of cancer can be detected and often prevented.  Routine colorectal cancer screening usually begins at 50 years of age and continues through 51 years of age.  Your health care provider may recommend screening at an earlier age if you have risk factors for colon cancer.  Your health care provider may also recommend using home test kits to check for hidden blood in the stool.  A small camera at the end of a tube can be used to examine your colon directly (sigmoidoscopy or colonoscopy). This is done to check for the earliest forms of colorectal  cancer.  Routine screening usually begins at age 50.  Direct examination of the colon should be repeated every 5-10 years through 51 years of age. However, you may need to be screened more often if early forms of precancerous polyps or small growths are found. Skin Cancer  Check your skin from head to toe regularly.  Tell your health care provider about any new moles or changes in moles, especially if there is a change in a mole's shape or color.  Also tell your health care provider if you have a mole that is larger than the size of a pencil eraser.  Always use sunscreen. Apply sunscreen liberally and repeatedly throughout the day.  Protect yourself by wearing long sleeves, pants, a wide-brimmed hat, and sunglasses whenever you are outside. HEART DISEASE, DIABETES, AND HIGH BLOOD PRESSURE   High blood pressure causes heart disease and increases the risk of stroke. High blood pressure is more likely to develop in:  People who have blood pressure in the high end   of the normal range (130-139/85-89 mm Hg).  People who are overweight or obese.  People who are African American.  If you are 38-23 years of age, have your blood pressure checked every 3-5 years. If you are 61 years of age or older, have your blood pressure checked every year. You should have your blood pressure measured twice--once when you are at a hospital or clinic, and once when you are not at a hospital or clinic. Record the average of the two measurements. To check your blood pressure when you are not at a hospital or clinic, you can use:  An automated blood pressure machine at a pharmacy.  A home blood pressure monitor.  If you are between 45 years and 39 years old, ask your health care provider if you should take aspirin to prevent strokes.  Have regular diabetes screenings. This involves taking a blood sample to check your fasting blood sugar level.  If you are at a normal weight and have a low risk for diabetes,  have this test once every three years after 51 years of age.  If you are overweight and have a high risk for diabetes, consider being tested at a younger age or more often. PREVENTING INFECTION  Hepatitis B  If you have a higher risk for hepatitis B, you should be screened for this virus. You are considered at high risk for hepatitis B if:  You were born in a country where hepatitis B is common. Ask your health care provider which countries are considered high risk.  Your parents were born in a high-risk country, and you have not been immunized against hepatitis B (hepatitis B vaccine).  You have HIV or AIDS.  You use needles to inject street drugs.  You live with someone who has hepatitis B.  You have had sex with someone who has hepatitis B.  You get hemodialysis treatment.  You take certain medicines for conditions, including cancer, organ transplantation, and autoimmune conditions. Hepatitis C  Blood testing is recommended for:  Everyone born from [birth year] through 1966.  Anyone with known risk factors for hepatitis C. Sexually transmitted infections (STIs)  You should be screened for sexually transmitted infections (STIs) including gonorrhea and chlamydia if:  You are sexually active and are younger than 51 years of age.  You are older than 51 years of age and your health care provider tells you that you are at risk for this type of infection.  Your sexual activity has changed since you were last screened and you are at an increased risk for chlamydia or gonorrhea. Ask your health care provider if you are at risk.  If you do not have HIV, but are at risk, it may be recommended that you take a prescription medicine daily to prevent HIV infection. This is called pre-exposure prophylaxis (PrEP). You are considered at risk if:  You are sexually active and do not regularly use condoms or know the HIV status of your partner(s).  You take drugs by injection.  You are sexually  active with a partner who has HIV. Talk with your health care provider about whether you are at high risk of being infected with HIV. If you choose to begin PrEP, you should first be tested for HIV. You should then be tested every 3 months for as long as you are taking PrEP.  PREGNANCY   If you are premenopausal and you may become pregnant, ask your health care provider about preconception counseling.  If you may  become pregnant, take 400 to 800 micrograms (mcg) of folic acid every day.  If you want to prevent pregnancy, talk to your health care provider about birth control (contraception). OSTEOPOROSIS AND MENOPAUSE   Osteoporosis is a disease in which the bones lose minerals and strength with aging. This can result in serious bone fractures. Your risk for osteoporosis can be identified using a bone density scan.  If you are 61 years of age or older, or if you are at risk for osteoporosis and fractures, ask your health care provider if you should be screened.  Ask your health care provider whether you should take a calcium or vitamin D supplement to lower your risk for osteoporosis.  Menopause may have certain physical symptoms and risks.  Hormone replacement therapy may reduce some of these symptoms and risks. Talk to your health care provider about whether hormone replacement therapy is right for you.  HOME CARE INSTRUCTIONS   Schedule regular health, dental, and eye exams.  Stay current with your immunizations.   Do not use any tobacco products including cigarettes, chewing tobacco, or electronic cigarettes.  If you are pregnant, do not drink alcohol.  If you are breastfeeding, limit how much and how often you drink alcohol.  Limit alcohol intake to no more than 1 drink per day for nonpregnant women. One drink equals 12 ounces of beer, 5 ounces of wine, or 1 ounces of hard liquor.  Do not use street drugs.  Do not share needles.  Ask your health care provider for help if  you need support or information about quitting drugs.  Tell your health care provider if you often feel depressed.  Tell your health care provider if you have ever been abused or do not feel safe at home.   This information is not intended to replace advice given to you by your health care provider. Make sure you discuss any questions you have with your health care provider.   Document Released: 11/19/2010 Document Revised: 05/27/2014 Document Reviewed: 04/07/2013 Elsevier Interactive Patient Education [employer].

## 2015-11-13 NOTE — Assessment & Plan Note (Signed)
Encouraged her to consume a low fat diet Will check Lipid profile today Continue Zocor and Niacin unless instructed otherwise

## 2015-11-13 NOTE — Assessment & Plan Note (Signed)
Discussed how weight loss could help improve your symptoms Prilosec refilled today

## 2015-11-15 ENCOUNTER — Other Ambulatory Visit: Payer: Self-pay | Admitting: Internal Medicine

## 2015-11-16 NOTE — Telephone Encounter (Signed)
Last filled 09/23/15--please advise

## 2015-11-16 NOTE — Telephone Encounter (Signed)
Ok to phone in Clonazepam 

## 2015-11-17 NOTE — Telephone Encounter (Signed)
Rx called in to pharmacy. 

## 2015-11-20 DIAGNOSIS — M722 Plantar fascial fibromatosis: Secondary | ICD-10-CM | POA: Diagnosis not present

## 2015-11-20 DIAGNOSIS — L03032 Cellulitis of left toe: Secondary | ICD-10-CM | POA: Diagnosis not present

## 2015-12-04 ENCOUNTER — Other Ambulatory Visit: Payer: Self-pay | Admitting: Internal Medicine

## 2015-12-11 DIAGNOSIS — L03032 Cellulitis of left toe: Secondary | ICD-10-CM | POA: Diagnosis not present

## 2015-12-11 DIAGNOSIS — M722 Plantar fascial fibromatosis: Secondary | ICD-10-CM | POA: Diagnosis not present

## 2015-12-12 DIAGNOSIS — Z8371 Family history of colonic polyps: Secondary | ICD-10-CM | POA: Diagnosis not present

## 2015-12-12 DIAGNOSIS — Z1211 Encounter for screening for malignant neoplasm of colon: Secondary | ICD-10-CM | POA: Diagnosis not present

## 2015-12-12 DIAGNOSIS — K648 Other hemorrhoids: Secondary | ICD-10-CM | POA: Diagnosis not present

## 2015-12-15 ENCOUNTER — Other Ambulatory Visit: Payer: Self-pay | Admitting: Internal Medicine

## 2015-12-23 ENCOUNTER — Other Ambulatory Visit: Payer: Self-pay | Admitting: Internal Medicine

## 2016-01-01 DIAGNOSIS — B070 Plantar wart: Secondary | ICD-10-CM | POA: Diagnosis not present

## 2016-01-15 DIAGNOSIS — B070 Plantar wart: Secondary | ICD-10-CM | POA: Diagnosis not present

## 2016-01-27 ENCOUNTER — Other Ambulatory Visit: Payer: Self-pay | Admitting: Internal Medicine

## 2016-01-29 NOTE — Telephone Encounter (Signed)
Last filled 11/16/2015--please advise

## 2016-01-29 NOTE — Telephone Encounter (Signed)
Ok to phone in Clonazepam 

## 2016-01-30 NOTE — Telephone Encounter (Signed)
Rx called in to pharmacy. 

## 2016-02-01 DIAGNOSIS — B070 Plantar wart: Secondary | ICD-10-CM | POA: Diagnosis not present

## 2016-02-05 DIAGNOSIS — K582 Mixed irritable bowel syndrome: Secondary | ICD-10-CM | POA: Diagnosis not present

## 2016-02-15 DIAGNOSIS — B070 Plantar wart: Secondary | ICD-10-CM | POA: Diagnosis not present

## 2016-02-24 ENCOUNTER — Other Ambulatory Visit: Payer: Self-pay | Admitting: Internal Medicine

## 2016-02-26 NOTE — Telephone Encounter (Signed)
Ok to phone in Clonazepam to fill on or after 10/11  * please change class to "phone in" before sending to me

## 2016-02-26 NOTE — Telephone Encounter (Signed)
Last filled 01/29/16--please advise--no UDS on file will have pt come in within the next 1-2 weeks

## 2016-02-28 NOTE — Telephone Encounter (Signed)
Rx called in to pharmacy. 

## 2016-03-07 DIAGNOSIS — B070 Plantar wart: Secondary | ICD-10-CM | POA: Diagnosis not present

## 2016-03-22 NOTE — Telephone Encounter (Signed)
Left message on voicemail Pt needs to come in and fill out CSA before next refill of Klonopin

## 2016-04-04 DIAGNOSIS — B070 Plantar wart: Secondary | ICD-10-CM | POA: Diagnosis not present

## 2016-04-10 ENCOUNTER — Encounter: Payer: Self-pay | Admitting: Internal Medicine

## 2016-04-10 ENCOUNTER — Telehealth: Payer: Self-pay

## 2016-04-10 DIAGNOSIS — Z79899 Other long term (current) drug therapy: Secondary | ICD-10-CM | POA: Diagnosis not present

## 2016-04-10 NOTE — Telephone Encounter (Signed)
Pt came in office to fill out controlled substance contract per Melonie's request. Weldon Picking at front desk will give pt paper work and direct to lab for urine.

## 2016-04-17 ENCOUNTER — Ambulatory Visit (INDEPENDENT_AMBULATORY_CARE_PROVIDER_SITE_OTHER): Payer: BC Managed Care – PPO | Admitting: Internal Medicine

## 2016-04-17 ENCOUNTER — Encounter: Payer: Self-pay | Admitting: Internal Medicine

## 2016-04-17 VITALS — BP 120/70 | HR 68 | Temp 98.3°F | Wt 239.0 lb

## 2016-04-17 DIAGNOSIS — Z1239 Encounter for other screening for malignant neoplasm of breast: Secondary | ICD-10-CM

## 2016-04-17 DIAGNOSIS — Z1231 Encounter for screening mammogram for malignant neoplasm of breast: Secondary | ICD-10-CM | POA: Diagnosis not present

## 2016-04-17 DIAGNOSIS — R5383 Other fatigue: Secondary | ICD-10-CM

## 2016-04-17 DIAGNOSIS — Z124 Encounter for screening for malignant neoplasm of cervix: Secondary | ICD-10-CM

## 2016-04-17 DIAGNOSIS — F32A Depression, unspecified: Secondary | ICD-10-CM

## 2016-04-17 DIAGNOSIS — M545 Low back pain, unspecified: Secondary | ICD-10-CM

## 2016-04-17 DIAGNOSIS — Z01419 Encounter for gynecological examination (general) (routine) without abnormal findings: Secondary | ICD-10-CM

## 2016-04-17 DIAGNOSIS — F329 Major depressive disorder, single episode, unspecified: Secondary | ICD-10-CM | POA: Diagnosis not present

## 2016-04-17 DIAGNOSIS — Z9189 Other specified personal risk factors, not elsewhere classified: Secondary | ICD-10-CM

## 2016-04-17 DIAGNOSIS — R41840 Attention and concentration deficit: Secondary | ICD-10-CM

## 2016-04-17 NOTE — Progress Notes (Signed)
Subjective:    Patient ID: Donna Vasquez, female    DOB: [birth year], 50 y.o.   MRN: [id number]  HPI  Pt presents to the clinic today for her pap smear. Her last pap smear was 2-3 years ago. She denies any vaginal complaints or concerns.   She also is requesting ADD testing. She reports she has never felt "normal" her whole life. She struggled academically in school, making mostly c's/d's. When she felt like she didn't understand the teacher, she just shut down. Her entire life, she reports she has had trouble focusing, lack of motivation. Over the years, this has led the her feeling of failure and depression. She reports her sister recently got tested for ADD and was told that she had it and she would like to be tested for this.  She would like her mammogram ordered. Her last one was ordered by [facility].  She also c/o low back pain. This started a few days ago. She describes the pain as stiff and sore. The pain does not radiate. It seems worse with bending over. She denies numbness or tingling in her legs. She denies loss of bowel or bladder. 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 She has not taken anything OTC for this.  Review of Systems      Past Medical History:  Diagnosis Date  . Allergy   . Asthma   . Chicken pox   . Frequent headaches   . Hyperlipidemia   . IBS (irritable bowel syndrome)   . Interstitial cystitis   . Urine incontinence     Current Outpatient Prescriptions  Medication Sig Dispense Refill  . albuterol (PROAIR HFA) 108 (90 Base) MCG/ACT inhaler Inhale 1-2 puffs into the lungs every 6 (six) hours as needed for wheezing or shortness of breath. 1 Inhaler 2  . aspirin 81 MG tablet Take 81 mg by mouth daily.    . cetirizine (ZYRTEC) 10 MG tablet Take 10 mg by mouth daily.  0  . Cholecalciferol (VITAMIN D-3) 1000 UNITS CAPS Take 3 capsules by mouth daily. 3000 units    .  clonazePAM (KLONOPIN) 0.5 MG tablet TAKE 1 TABLET EVERY 6 HOURS AS NEEDED 30 tablet 0  . Fluticasone-Salmeterol (ADVAIR) 250-50 MCG/DOSE AEPB Inhale 1 puff into the lungs daily. 60 each 2  . hyoscyamine (LEVSIN, ANASPAZ) 0.125 MG tablet Take 1 tablet (0.125 mg total) by mouth every 6 (six) hours as needed. 30 tablet 2  . montelukast (SINGULAIR) 10 MG tablet TAKE 1 TABLET (10 MG TOTAL) BY MOUTH AT BEDTIME. 30 tablet 2  . niacin (NIASPAN) 1000 MG CR tablet TAKE 1 TABLET AT BEDTIME 30 tablet 5  . omeprazole (PRILOSEC) 20 MG capsule Take 1 capsule (20 mg total) by mouth daily. 30 capsule 2  . sertraline (ZOLOFT) 100 MG tablet TAKE 1 TABLET BY MOUTH DAILY. MUST SCHEDULE ANNUAL PHYSICAL FOR MORE REFILLS 30 tablet 5  . simvastatin (ZOCOR) 20 MG tablet TAKE 1 TABLET (20 MG TOTAL) BY MOUTH AT BEDTIME. 30 tablet 5  . triamcinolone cream (KENALOG) 0.1 % Apply 1 application topically 2 (two) times daily as needed. 45 g 1   No current facility-administered medications for this visit.     Allergies  Allergen Reactions  . Sulfa Antibiotics Hives    Family History  Problem Relation Age of Onset  . COPD Mother   . Heart disease Mother   .  Polymyalgia rheumatica Mother   . Emphysema Mother   . Stroke Mother   . Heart disease Father   . Hypertension Father   . Cancer Paternal Aunt     Breast  . Stroke Maternal Grandmother   . Cancer Maternal Aunt     Social History   Social History  . Marital status: Married    Spouse name: N/A  . Number of children: y  . Years of education: N/A   Occupational History  . tacher assistant    Social History Main Topics  . Smoking status: Never Smoker  . Smokeless tobacco: Never Used  . Alcohol use 0.0 oz/week     Comment: rare--wine  . Drug use: No  . Sexual activity: Not on file   Other Topics Concern  . Not on file   Social History Narrative  . No narrative on file     Constitutional: Pt reports fatigue. Denies fever, malaise, headache or  abrupt weight changes.  HEENT: Denies eye pain, eye redness, ear pain, ringing in the ears, wax buildup, runny nose, nasal congestion, bloody nose, or sore throat. Respiratory: Denies difficulty breathing, shortness of breath, cough or sputum production.   Cardiovascular: Denies chest pain, chest tightness, palpitations or swelling in the hands or feet.  Gastrointestinal: Denies abdominal pain, bloating, constipation, diarrhea or blood in the stool.  GU: Denies urgency, frequency, pain with urination, burning sensation, blood in urine, odor or discharge. Musculoskeletal: Pt reports back pain. Denies decrease in range of motion, difficulty with gait, muscle pain or joint swelling.  Skin: Denies redness, rashes, lesions or ulcercations.  Neurological: Pt reports problems focusing, lack of motivation. Denies dizziness, difficulty with memory, difficulty with speech or problems with balance and coordination.  Psych: P has history of depression. Denies anxiety, SI/HI.  No other specific complaints in a complete review of systems (except as listed in HPI above).  Objective:   Physical Exam   BP 120/70   Pulse 68   Temp 98.3 F (36.8 C) (Oral)   Wt 239 lb (108.4 kg)   LMP 04/01/2016   SpO2 98%   BMI 36.88 kg/m  Wt Readings from Last 3 Encounters:  04/17/16 239 lb (108.4 kg)  11/13/15 238 lb 14.4 oz (108.4 kg)  08/17/15 243 lb (110.2 kg)    General: Appears her stated age, obese in NAD. Abdomen: Soft and nontender. Normal bowel sounds. No distention or masses noted. Pelvic: Normal female anatomy. Cervix without changes. No discharge noted. Adnexa non palpable. Musculoskeletal: Normal flexion, extension and rotation of the spine. Pain with palpation over the lumbar/sacral area. No pain with palpation of the paralumbar muscles. Strength 5/5 BLE. Neurological: Alert and oriented. Psychiatric: Mood and affect mildly flat. Behavior is normal. Judgment and thought content normal.      BMET    Component Value Date/Time   NA 138 11/13/2015 1501   K 4.4 11/13/2015 1501   CL 103 11/13/2015 1501   CO2 31 11/13/2015 1501   GLUCOSE 93 11/13/2015 1501   BUN 17 11/13/2015 1501   CREATININE 0.91 11/13/2015 1501   CALCIUM 9.5 11/13/2015 1501    Lipid Panel     Component Value Date/Time   CHOL 183 11/13/2015 1501   TRIG 149.0 11/13/2015 1501   HDL 54.20 11/13/2015 1501   CHOLHDL 3 11/13/2015 1501   VLDL 29.8 11/13/2015 1501   LDLCALC 99 11/13/2015 1501    CBC    Component Value Date/Time   WBC 8.6 11/13/2015 1501  RBC 4.49 11/13/2015 1501   HGB 13.7 11/13/2015 1501   HCT 40.4 11/13/2015 1501   PLT 253.0 11/13/2015 1501   MCV 90.0 11/13/2015 1501   MCHC 34.0 11/13/2015 1501   RDW 12.7 11/13/2015 1501    Hgb A1C Lab Results  Component Value Date   HGBA1C 5.0 11/13/2015           Assessment & Plan:   Screening for cervical cancer with routine gyn exam:  Pap smear today She declines STD testing  Screening for breast cancer:  Mammogram ordered She will call [place] to schedule  Low back pain:  Likely arthritis Advised her to take Ibuprofen or Aleve prn Stretching exercises given Discussed how strengthening the core and weight loss could help the back pain. Will hold off on xray for now  Problems focusing, lack of motivation, fatigue and depression:  Could be ADD vs inadequately treated depression Referral ot Dr. Reggy Eye for formal ADD testing

## 2016-04-17 NOTE — Patient Instructions (Signed)

## 2016-04-18 ENCOUNTER — Other Ambulatory Visit (HOSPITAL_COMMUNITY)
Admission: RE | Admit: 2016-04-18 | Discharge: 2016-04-18 | Disposition: A | Discharge status: Home / Self Care | Payer: BC Managed Care – PPO | Source: Ambulatory Visit | Attending: Chiropractic Medicine | Admitting: Chiropractic Medicine

## 2016-04-18 DIAGNOSIS — Z1151 Encounter for screening for human papillomavirus (HPV): Secondary | ICD-10-CM | POA: Diagnosis not present

## 2016-04-18 DIAGNOSIS — Z01419 Encounter for gynecological examination (general) (routine) without abnormal findings: Secondary | ICD-10-CM | POA: Diagnosis not present

## 2016-04-18 NOTE — Addendum Note (Signed)
Addended by: Roena Malady on: 04/18/2016 09:51 AM   Modules accepted: Orders

## 2016-04-23 DIAGNOSIS — B070 Plantar wart: Secondary | ICD-10-CM | POA: Diagnosis not present

## 2016-04-23 LAB — CYTOLOGY - PAP
DIAGNOSIS: NEGATIVE
HPV (WINDOPATH): NOT DETECTED

## 2016-05-21 DIAGNOSIS — B070 Plantar wart: Secondary | ICD-10-CM | POA: Diagnosis not present

## 2016-05-30 ENCOUNTER — Ambulatory Visit (INDEPENDENT_AMBULATORY_CARE_PROVIDER_SITE_OTHER): Payer: BC Managed Care – PPO | Admitting: Family Medicine

## 2016-05-30 ENCOUNTER — Ambulatory Visit (INDEPENDENT_AMBULATORY_CARE_PROVIDER_SITE_OTHER)
Admission: RE | Admit: 2016-05-30 | Discharge: 2016-05-30 | Disposition: A | Discharge status: Home / Self Care | Payer: BC Managed Care – PPO | Source: Ambulatory Visit | Attending: Family Medicine | Admitting: Family Medicine

## 2016-05-30 ENCOUNTER — Encounter: Payer: Self-pay | Admitting: Family Medicine

## 2016-05-30 VITALS — BP 120/70 | HR 74 | Wt 245.0 lb

## 2016-05-30 DIAGNOSIS — Z8739 Personal history of other diseases of the musculoskeletal system and connective tissue: Secondary | ICD-10-CM

## 2016-05-30 DIAGNOSIS — G8929 Other chronic pain: Secondary | ICD-10-CM

## 2016-05-30 DIAGNOSIS — M546 Pain in thoracic spine: Secondary | ICD-10-CM

## 2016-05-30 DIAGNOSIS — M545 Low back pain: Secondary | ICD-10-CM | POA: Diagnosis not present

## 2016-05-30 DIAGNOSIS — M419 Scoliosis, unspecified: Secondary | ICD-10-CM | POA: Diagnosis not present

## 2016-05-30 MED ORDER — MELOXICAM 7.5 MG PO TABS: 7.5000 mg | ORAL_TABLET | Freq: Every day | ORAL | 1 refills | Status: DC | PRN

## 2016-05-30 MED ORDER — CYCLOBENZAPRINE HCL 10 MG PO TABS: 10.0000 mg | ORAL_TABLET | Freq: Every evening | ORAL | 1 refills | Status: DC | PRN

## 2016-05-30 NOTE — Progress Notes (Signed)
Subjective:    Patient ID: Donna Vasquez, female    DOB: September 07, 1965, 51 y.o.   MRN: [id number]  HPI This is a 51 yo female who presents today with several months of low back pain. Area feels swollen and painful to touch. Today her legs feel weak and she feels more like her entire back is affected. Ibuprofen 400 mg helps, but when it wears off, she has more pain. Doesn't affect sitting, or turning, pain is with standing and sleeping. Has chronic, mild, intermittent numbness on top of thighs since children were born (20+ years ago), no change in this. No sciatica, no foot drop, no falls. No change in bowels or bladder.   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 Was treated with aspirin therapy. Was always careful with activity following diagnosis. Has chronic thoracic pain over spine, no recent change. No regular exercise. 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  Does not recall any imaging of back as adult.   Past Medical History:  Diagnosis Date  . Allergy   . Asthma   . Chicken pox   . Frequent headaches   . Hyperlipidemia   . IBS (irritable bowel syndrome)   . Interstitial cystitis   . Urine incontinence    Past Surgical History:  Procedure Laterality Date  . CHOLECYSTECTOMY    . cyst removed from back    . NASAL SINUS SURGERY    . TUBAL LIGATION  1999   Family History  Problem Relation Age of Onset  . COPD Mother   . Heart disease Mother   . Polymyalgia rheumatica Mother   . Emphysema Mother   . Stroke Mother   . Heart disease Father   . Hypertension Father   . Cancer Paternal Aunt     Breast  . Stroke Maternal Grandmother   . Cancer Maternal Aunt    Social History  Substance Use Topics  . Smoking status: Never Smoker  . Smokeless tobacco: Never Used  . Alcohol use 0.0 oz/week     Comment: rare--wine      Review of Systems Per HPI    Objective:   Physical Exam  Constitutional: She is  oriented to person, place, and time. She appears well-developed and well-nourished. No distress.  obese  HENT:  Head: Normocephalic and atraumatic.  Mouth/Throat: Oropharynx is clear and moist.  Eyes: Conjunctivae and EOM are normal. Pupils are equal, round, and reactive to light. Right eye exhibits no discharge. Left eye exhibits no discharge.  Neck: Normal range of motion. Neck supple.  Cardiovascular: Normal rate, regular rhythm and normal heart sounds.   Pulmonary/Chest: Effort normal and breath sounds normal.  Musculoskeletal:       Cervical back: Normal.       Thoracic back: She exhibits bony tenderness. She exhibits normal range of motion, no tenderness, no swelling, no edema and no deformity.       Lumbar back: She exhibits normal range of motion, no tenderness, no bony tenderness, no swelling and no deformity.       Back:  UE/LE strength 5/5 Normal gait Negative straight leg raise. Full ROM bilateral hips.    Lymphadenopathy:    She has no cervical adenopathy.  Neurological: She is alert and oriented to person, place, and time. She has normal reflexes. No cranial nerve deficit.  Skin: Skin is warm and dry. She is not diaphoretic.  Psychiatric: She has  a normal mood and affect. Her behavior is normal. Judgment and thought content normal.  Vitals reviewed.   BP 120/70   Pulse 74   Wt 245 lb (111.1 kg)   SpO2 98%   BMI 37.81 kg/m  Wt Readings from Last 3 Encounters:  05/30/16 245 lb (111.1 kg)  04/17/16 239 lb (108.4 kg)  11/13/15 238 lb 14.4 oz (108.4 kg)       Assessment & Plan:  1. Chronic midline low back pain without sciatica - discussed chronic nature of pain and treatment- NSAIDs, muscle relaxer, exercise/PT, weight loss - Provided written and verbal information regarding diagnosis and treatment. - DG Lumbar Spine 2-3 Views; Future - meloxicam (MOBIC) 7.5 MG tablet; Take 1 tablet (7.5 mg total) by mouth daily as needed for pain.  Dispense: 30 tablet; Refill:  1 - cyclobenzaprine (FLEXERIL) 10 MG tablet; Take 1 tablet (10 mg total) by mouth at bedtime as needed for muscle spasms.  Dispense: 30 tablet; Refill: 1 - follow up with PCP in 6-8 weeks, sooner if worsening symptoms  2. History of juvenile arthritis - will obtain xrays to determine any structural abnormalities - DG Lumbar Spine 2-3 Views; Future - DG Thoracic Spine 2 View; Future  3. Chronic midline thoracic back pain - DG Thoracic Spine 2 View; Future - meloxicam (MOBIC) 7.5 MG tablet; Take 1 tablet (7.5 mg total) by mouth daily as needed for pain.  Dispense: 30 tablet; Refill: 1 - cyclobenzaprine (FLEXERIL) 10 MG tablet; Take 1 tablet (10 mg total) by mouth at bedtime as needed for muscle spasms.  Dispense: 30 tablet; Refill: 1   Olean Ree, FNP-BC  [facility], [facility]  05/31/2016 9:31 AM

## 2016-05-30 NOTE — Patient Instructions (Signed)
Try to walk at least 15-20 minutes most days Follow up with Rene Kocher in 6-8 weeks, sooner if symptoms get worse  Back Exercises The following exercises strengthen the muscles that help to support the back. They also help to keep the lower back flexible. Doing these exercises can help to prevent back pain or lessen existing pain. If you have back pain or discomfort, try doing these exercises 2-3 times each day or as told by your health care provider. When the pain goes away, do them once each day, but increase the number of times that you repeat the steps for each exercise (do more repetitions). If you do not have back pain or discomfort, do these exercises once each day or as told by your health care provider. Exercises Single Knee to Chest  Repeat these steps 3-5 times for each leg: 1. Lie on your back on a firm bed or the floor with your legs extended. 2. Bring one knee to your chest. Your other leg should stay extended and in contact with the floor. 3. Hold your knee in place by grabbing your knee or thigh. 4. Pull on your knee until you feel a gentle stretch in your lower back. 5. Hold the stretch for 10-30 seconds. 6. Slowly release and straighten your leg. Pelvic Tilt  Repeat these steps 5-10 times: 1. Lie on your back on a firm bed or the floor with your legs extended. 2. Bend your knees so they are pointing toward the ceiling and your feet are flat on the floor. 3. Tighten your lower abdominal muscles to press your lower back against the floor. This motion will tilt your pelvis so your tailbone points up toward the ceiling instead of pointing to your feet or the floor. 4. With gentle tension and even breathing, hold this position for 5-10 seconds. Cat-Cow  Repeat these steps until your lower back becomes more flexible: 1. Get into a hands-and-knees position on a firm surface. Keep your hands under your shoulders, and keep your knees under your hips. You may place padding under your knees  for comfort. 2. Let your head hang down, and point your tailbone toward the floor so your lower back becomes rounded like the back of a cat. 3. Hold this position for 5 seconds. 4. Slowly lift your head and point your tailbone up toward the ceiling so your back forms a sagging arch like the back of a cow. 5. Hold this position for 5 seconds. Press-Ups  Repeat these steps 5-10 times: 1. Lie on your abdomen (face-down) on the floor. 2. Place your palms near your head, about shoulder-width apart. 3. While you keep your back as relaxed as possible and keep your hips on the floor, slowly straighten your arms to raise the top half of your body and lift your shoulders. Do not use your back muscles to raise your upper torso. You may adjust the placement of your hands to make yourself more comfortable. 4. Hold this position for 5 seconds while you keep your back relaxed. 5. Slowly return to lying flat on the floor. Bridges  Repeat these steps 10 times: 1. Lie on your back on a firm surface. 2. Bend your knees so they are pointing toward the ceiling and your feet are flat on the floor. 3. Tighten your buttocks muscles and lift your buttocks off of the floor until your waist is at almost the same height as your knees. You should feel the muscles working in your buttocks and the  back of your thighs. If you do not feel these muscles, slide your feet 1-2 inches farther away from your buttocks. 4. Hold this position for 3-5 seconds. 5. Slowly lower your hips to the starting position, and allow your buttocks muscles to relax completely. If this exercise is too easy, try doing it with your arms crossed over your chest. Abdominal Crunches  Repeat these steps 5-10 times: 1. Lie on your back on a firm bed or the floor with your legs extended. 2. Bend your knees so they are pointing toward the ceiling and your feet are flat on the floor. 3. Cross your arms over your chest. 4. Tip your chin slightly toward your  chest without bending your neck. 5. Tighten your abdominal muscles and slowly raise your trunk (torso) high enough to lift your shoulder blades a tiny bit off of the floor. Avoid raising your torso higher than that, because it can put too much stress on your low back and it does not help to strengthen your abdominal muscles. 6. Slowly return to your starting position. Back Lifts  Repeat these steps 5-10 times: 1. Lie on your abdomen (face-down) with your arms at your sides, and rest your forehead on the floor. 2. Tighten the muscles in your legs and your buttocks. 3. Slowly lift your chest off of the floor while you keep your hips pressed to the floor. Keep the back of your head in line with the curve in your back. Your eyes should be looking at the floor. 4. Hold this position for 3-5 seconds. 5. Slowly return to your starting position. Contact a health care provider if:  Your back pain or discomfort gets much worse when you do an exercise.  Your back pain or discomfort does not lessen within 2 hours after you exercise. If you have any of these problems, stop doing these exercises right away. Do not do them again unless your health care provider says that you can. Get help right away if:  You develop sudden, severe back pain. If this happens, stop doing the exercises right away. Do not do them again unless your health care provider says that you can. This information is not intended to replace advice given to you by your health care provider. Make sure you discuss any questions you have with your health care provider. Document Released: 06/13/2004 Document Revised: 09/13/2015 Document Reviewed: 06/30/2014 Elsevier Interactive Patient Education  2017 [employer].

## 2016-06-10 ENCOUNTER — Ambulatory Visit: Payer: BC Managed Care – PPO | Attending: Internal Medicine

## 2016-06-25 ENCOUNTER — Other Ambulatory Visit: Payer: Self-pay | Admitting: Internal Medicine

## 2016-07-05 ENCOUNTER — Encounter: Payer: Self-pay | Admitting: Family Medicine

## 2016-07-05 ENCOUNTER — Ambulatory Visit (INDEPENDENT_AMBULATORY_CARE_PROVIDER_SITE_OTHER): Payer: BC Managed Care – PPO | Admitting: Family Medicine

## 2016-07-05 ENCOUNTER — Encounter: Payer: Self-pay | Admitting: Internal Medicine

## 2016-07-05 DIAGNOSIS — J069 Acute upper respiratory infection, unspecified: Secondary | ICD-10-CM | POA: Diagnosis not present

## 2016-07-05 MED ORDER — MOMETASONE FUROATE 50 MCG/ACT NA SUSP: 2.0000 | Freq: Every day | NASAL | 1 refills | Status: DC

## 2016-07-05 MED ORDER — AMOXICILLIN-POT CLAVULANATE 875-125 MG PO TABS: 1.0000 | ORAL_TABLET | Freq: Two times a day (BID) | ORAL | 0 refills | Status: DC

## 2016-07-05 MED ORDER — OSELTAMIVIR PHOSPHATE 75 MG PO CAPS: 75.0000 mg | ORAL_CAPSULE | Freq: Two times a day (BID) | ORAL | 0 refills | Status: DC

## 2016-07-05 MED ORDER — ALBUTEROL SULFATE HFA 108 (90 BASE) MCG/ACT IN AERS: 1.0000 | INHALATION_SPRAY | Freq: Four times a day (QID) | RESPIRATORY_TRACT | 2 refills | Status: DC | PRN

## 2016-07-05 NOTE — Patient Instructions (Signed)
See if you can tolerate nasonex nasal spray.  Or use nasal saline.  If you have significant sinus pain that persists, then start augmentin.  If you have flu symptoms, then start tamiflu.  It would be okay to take tamiflu once a day in the meantime to try to prevent infection.  Thanks for getting a flu shot.  Update [name] as needed.  Take care.  Glad to see you.

## 2016-07-05 NOTE — Progress Notes (Signed)
Sx started a few weeks, off and on.  Taking mucinex intermittently.    Father dx'd with flu recently.  She had been staying with them. Teacher's assistant, with sick exposures at work.   She didn't think she had the flu.   She has facial pain.  Nasal congestion.  A lot of drainage.  Some diarrhea, likely from post nasal gtt.    Meds, vitals, and allergies reviewed.   ROS: Per HPI unless specifically indicated in ROS section   GEN: nad, alert and oriented HEENT: mucous membranes moist, tm w/o erythema, nasal exam w/o erythema, clear discharge noted,  OP with cobblestoning, sinuses mildly ttp NECK: supple w/o LA CV: rrr.   PULM: ctab, no inc wob EXT: no edema

## 2016-07-06 DIAGNOSIS — J069 Acute upper respiratory infection, unspecified: Secondary | ICD-10-CM | POA: Insufficient documentation

## 2016-07-06 NOTE — Assessment & Plan Note (Signed)
D/w pt.  She'll see if she can tolerate nasonex nasal spray.  Can use nasal saline.  If significant sinus pain that persists/worsens, then start augmentin.  If she has typical flu symptoms, then start tamiflu.  It would be okay to take tamiflu once a day in the meantime to try to prevent infection.  She agrees.   Update [name] as needed.

## 2016-07-10 ENCOUNTER — Ambulatory Visit: Payer: BC Managed Care – PPO | Admitting: Psychology

## 2016-07-25 ENCOUNTER — Other Ambulatory Visit: Payer: Self-pay | Admitting: Internal Medicine

## 2016-08-15 ENCOUNTER — Ambulatory Visit (INDEPENDENT_AMBULATORY_CARE_PROVIDER_SITE_OTHER): Payer: BC Managed Care – PPO | Admitting: Psychology

## 2016-08-15 ENCOUNTER — Other Ambulatory Visit: Payer: Self-pay | Admitting: Internal Medicine

## 2016-08-15 DIAGNOSIS — F419 Anxiety disorder, unspecified: Secondary | ICD-10-CM

## 2016-08-15 DIAGNOSIS — F330 Major depressive disorder, recurrent, mild: Secondary | ICD-10-CM

## 2016-10-08 ENCOUNTER — Ambulatory Visit (INDEPENDENT_AMBULATORY_CARE_PROVIDER_SITE_OTHER): Payer: BC Managed Care – PPO | Admitting: Psychology

## 2016-10-08 DIAGNOSIS — F330 Major depressive disorder, recurrent, mild: Secondary | ICD-10-CM

## 2016-10-08 DIAGNOSIS — F419 Anxiety disorder, unspecified: Secondary | ICD-10-CM

## 2016-10-11 ENCOUNTER — Ambulatory Visit (INDEPENDENT_AMBULATORY_CARE_PROVIDER_SITE_OTHER): Payer: BC Managed Care – PPO | Admitting: Psychology

## 2016-10-11 DIAGNOSIS — F331 Major depressive disorder, recurrent, moderate: Secondary | ICD-10-CM | POA: Diagnosis not present

## 2016-10-11 DIAGNOSIS — F900 Attention-deficit hyperactivity disorder, predominantly inattentive type: Secondary | ICD-10-CM

## 2016-12-10 ENCOUNTER — Other Ambulatory Visit: Payer: Self-pay | Admitting: Internal Medicine

## 2016-12-12 ENCOUNTER — Telehealth: Payer: Self-pay | Admitting: Internal Medicine

## 2016-12-12 ENCOUNTER — Ambulatory Visit (INDEPENDENT_AMBULATORY_CARE_PROVIDER_SITE_OTHER): Payer: 59 | Admitting: Family Medicine

## 2016-12-12 ENCOUNTER — Encounter: Payer: Self-pay | Admitting: Family Medicine

## 2016-12-12 DIAGNOSIS — M545 Low back pain, unspecified: Secondary | ICD-10-CM

## 2016-12-12 DIAGNOSIS — M542 Cervicalgia: Secondary | ICD-10-CM

## 2016-12-12 DIAGNOSIS — M546 Pain in thoracic spine: Secondary | ICD-10-CM | POA: Diagnosis not present

## 2016-12-12 DIAGNOSIS — G8929 Other chronic pain: Secondary | ICD-10-CM

## 2016-12-12 MED ORDER — DICLOFENAC SODIUM 75 MG PO TBEC
75.0000 mg | DELAYED_RELEASE_TABLET | Freq: Two times a day (BID) | ORAL | 0 refills | Status: DC
Start: 2016-12-12 — End: 2016-12-16

## 2016-12-12 MED ORDER — CYCLOBENZAPRINE HCL 10 MG PO TABS: 10.0000 mg | ORAL_TABLET | Freq: Every evening | ORAL | 0 refills | Status: DC | PRN

## 2016-12-12 NOTE — Telephone Encounter (Signed)
I spoke with pt and she does not think needs to go to ED and pt scheduled appt today at 3:15 with Dr Ermalene Searing. If condition worsens prior to appt pt will go to [facility] or ED.

## 2016-12-12 NOTE — Assessment & Plan Note (Signed)
Not clearly cervical radiculopathy as negative Spurlings..  But possible. Muscle spasm also present.  treat with NSAID heat, upp back stretches but not of neck. Heat.  Follow up if not improving in 2 weeks.

## 2016-12-12 NOTE — Progress Notes (Signed)
Subjective:    Patient ID: Donna Vasquez, female    DOB: 23-Sep-1965, 51 y.o.   MRN: [id number]  Neck Pain   This is a new problem. The current episode started today. The problem occurs constantly. The problem has been gradually worsening. The pain is associated with an unknown (yesterday slept on neck wrong) factor. The pain is present in the left side. The quality of the pain is described as aching. The symptoms are aggravated by twisting and position (leaning head back and turng neck.). The pain is worse during the night. Stiffness is present all day. Pertinent negatives include no chest pain, fever, headaches, leg pain, numbness, pain with swallowing, paresis, syncope, trouble swallowing or weakness. Associated symptoms comments: Pain radiated to upper posterior arm. She has tried NSAIDs and heat (took old meloxicam, menthol patch) for the symptoms. The treatment provided no relief.    No falls    HX of mild degenerative change in  thoracic spine.  Review of Systems  Constitutional: Negative for fever.  HENT: Negative for trouble swallowing.   Cardiovascular: Negative for chest pain and syncope.  Musculoskeletal: Positive for neck pain.  Neurological: Negative for weakness, numbness and headaches.       Objective:   Physical Exam  Constitutional: She is oriented to person, place, and time. Vital signs are normal. She appears well-developed and well-nourished. She is cooperative.  Non-toxic appearance. She does not appear ill. No distress.  HENT:  Head: Normocephalic.  Right Ear: Hearing, tympanic membrane, external ear and ear canal normal. Tympanic membrane is not erythematous, not retracted and not bulging.  Left Ear: Hearing, tympanic membrane, external ear and ear canal normal. Tympanic membrane is not erythematous, not retracted and not bulging.  Nose: No mucosal edema or rhinorrhea. Right sinus exhibits no maxillary sinus tenderness and no frontal sinus tenderness. Left  sinus exhibits no maxillary sinus tenderness and no frontal sinus tenderness.  Mouth/Throat: Uvula is midline, oropharynx is clear and moist and mucous membranes are normal.  Eyes: Pupils are equal, round, and reactive to light. Conjunctivae, EOM and lids are normal. Lids are everted and swept, no foreign bodies found.  Neck: Trachea normal and normal range of motion. Neck supple. Carotid bruit is not present. No thyroid mass and no thyromegaly present.  Cardiovascular: Normal rate, regular rhythm, S1 normal, S2 normal, normal heart sounds, intact distal pulses and normal pulses.  Exam reveals no gallop and no friction rub.   No murmur heard. Pulmonary/Chest: Effort normal and breath sounds normal. No tachypnea. No respiratory distress. She has no decreased breath sounds. She has no wheezes. She has no rhonchi. She has no rales.  Abdominal: Soft. Normal appearance and bowel sounds are normal. There is no tenderness.  Musculoskeletal:       Cervical back: She exhibits decreased range of motion and tenderness. She exhibits no bony tenderness.  ttp over trapezius, neg Spurling test bialterally  Neurological: She is alert and oriented to person, place, and time. She has normal strength and normal reflexes. No cranial nerve deficit or sensory deficit. She exhibits normal muscle tone. She displays a negative Romberg sign. Coordination and gait normal. GCS eye subscore is 4. GCS verbal subscore is 5. GCS motor subscore is 6.  Nml cerebellar exam   No papilledema  Skin: Skin is warm, dry and intact. No rash noted.  Psychiatric: She has a normal mood and affect. Her speech is normal and behavior is normal. Judgment and thought content normal. Her mood  appears not anxious. Cognition and memory are normal. Cognition and memory are not impaired. She does not exhibit a depressed mood. She exhibits normal recent memory and normal remote memory.          Assessment & Plan:

## 2016-12-12 NOTE — Patient Instructions (Addendum)
Start diclofenac twice daily with food x 2 weeks.  Start muscle relaxant at bedtime. Upper back stretching. Stop meloxicam. Heat on neck  Follow up if not improving in 2 weeks.

## 2016-12-12 NOTE — Telephone Encounter (Signed)
Patient Name: Donna Vasquez DOB: May 23, 1965 Initial Comment caller states she believes she pinched a nerve in her neck . Her left side of neck is painful . It goes down neck and under her arm Nurse Assessment Nurse: Adin Hector, RN, Vernona Rieger Date/Time Lamount Cohen Time): 12/12/2016 9:32:03 AM Confirm and document reason for call. If symptomatic, describe symptoms. ---caller states she has pain in her neck,down her back and around her shoulder, thinks she may have pinched a nerve, yesterday felt a twinge yesterday when she got out of bed. Does the patient have any new or worsening symptoms? ---Yes Will a triage be completed? ---Yes Related visit to physician within the last 2 weeks? ---N/A Does the PT have any chronic conditions? (i.e. diabetes, asthma, etc.) ---No Is the patient pregnant or possibly pregnant? (Ask all females between the ages of 37-55) ---No Is this a behavioral health or substance abuse call? ---No Guidelines Guideline Title Affirmed Question Affirmed Notes Neck Pain or Stiffness Weakness of an arm or hand Final Disposition User Go to ED Now Mester, RN, Vernona Rieger Comments pt feels it unlikely she has any disc damage, unsure if she wants to be sen at ED , explained that based on her reported symtoms, guidleine recommendation is to seek evaluation at ED Referrals [facility] - ED [facility] - ED Disagree/Comply: Disagree Disagree/Comply Reason: Wait and see

## 2016-12-12 NOTE — Telephone Encounter (Signed)
Agree with OV 

## 2016-12-14 ENCOUNTER — Emergency Department
Admission: EM | Admit: 2016-12-14 | Discharge: 2016-12-14 | Disposition: A | Discharge status: Home / Self Care | Payer: 59 | Attending: Emergency Medicine | Admitting: Emergency Medicine

## 2016-12-14 ENCOUNTER — Encounter: Payer: Self-pay | Admitting: Emergency Medicine

## 2016-12-14 ENCOUNTER — Emergency Department: Payer: 59

## 2016-12-14 DIAGNOSIS — M436 Torticollis: Secondary | ICD-10-CM | POA: Diagnosis not present

## 2016-12-14 DIAGNOSIS — Z79899 Other long term (current) drug therapy: Secondary | ICD-10-CM | POA: Insufficient documentation

## 2016-12-14 DIAGNOSIS — J45909 Unspecified asthma, uncomplicated: Secondary | ICD-10-CM | POA: Diagnosis not present

## 2016-12-14 DIAGNOSIS — Z7982 Long term (current) use of aspirin: Secondary | ICD-10-CM | POA: Diagnosis not present

## 2016-12-14 DIAGNOSIS — M542 Cervicalgia: Secondary | ICD-10-CM | POA: Diagnosis present

## 2016-12-14 MED ORDER — ORPHENADRINE CITRATE 30 MG/ML IJ SOLN
60.0000 mg | Freq: Two times a day (BID) | INTRAMUSCULAR | Status: DC
  Administered 2016-12-14: 60 mg via INTRAMUSCULAR
  Filled 2016-12-14: qty 2

## 2016-12-14 MED ORDER — OXYCODONE-ACETAMINOPHEN 7.5-325 MG PO TABS: 1.0000 | ORAL_TABLET | Freq: Four times a day (QID) | ORAL | 0 refills | Status: DC | PRN

## 2016-12-14 MED ORDER — OXYCODONE-ACETAMINOPHEN 5-325 MG PO TABS
1.0000 | ORAL_TABLET | Freq: Once | ORAL | Status: AC
  Administered 2016-12-14: 1 via ORAL
  Filled 2016-12-14: qty 1

## 2016-12-14 NOTE — ED Notes (Signed)
Pt verbalized understanding of discharge instructions. NAD at this time. 

## 2016-12-14 NOTE — ED Provider Notes (Signed)
[facility] Emergency Department Provider Note  ____________________________________________   First MD Initiated Contact with Patient 12/14/16 1112     (approximate)  I have reviewed the triage vital signs and the nursing notes.   HISTORY  Chief Complaint Neck Pain    HPI Donna Vasquez is a 51 y.o. female is here complaining of cervical pain.Patient states that she woke with neck stiffness 2 days ago. She states that she saw her PCP yesterday and was prescribed diclofenac 75 mg twice a day and Flexeril 10 mg which has not helped with her neck stiffness. Patient denies any neck injury. She denies any paresthesias to her upper or lower extremities. Currently she rates her pain as an 8 out of 10.   Past Medical History:  Diagnosis Date  . Allergy   . Asthma   . Chicken pox   . Frequent headaches   . Hyperlipidemia   . IBS (irritable bowel syndrome)   . Interstitial cystitis   . Urine incontinence     Patient Active Problem List   Diagnosis Date Noted  . Neck pain, acute 12/12/2016  . URI (upper respiratory infection) 07/06/2016  . OSA (obstructive sleep apnea) 08/19/2014  . IBS (irritable bowel syndrome) 05/26/2014  . Gastroesophageal reflux disease without esophagitis 05/26/2014  . Allergy-induced asthma 05/26/2014  . Insomnia 05/26/2014  . HLD (hyperlipidemia) 05/26/2014  . Low back pain 12/24/2010    Past Surgical History:  Procedure Laterality Date  . CHOLECYSTECTOMY    . cyst removed from back    . NASAL SINUS SURGERY    . TUBAL LIGATION  1999    Prior to Admission medications   Medication Sig Start Date End Date Taking? Authorizing Provider  albuterol (PROAIR HFA) 108 (90 Base) MCG/ACT inhaler Inhale 1-2 puffs into the lungs every 6 (six) hours as needed for wheezing or shortness of breath. 07/05/16   Joaquim Nam, MD  aspirin 81 MG tablet Take 81 mg by mouth daily.    [provider]  cetirizine (ZYRTEC) 10 MG  tablet Take 10 mg by mouth daily. 08/04/15   [provider]  Cholecalciferol (VITAMIN D-3) 1000 UNITS CAPS Take 3 capsules by mouth daily. 3000 units    [provider]  clonazePAM (KLONOPIN) 0.5 MG tablet TAKE 1 TABLET EVERY 6 HOURS AS NEEDED 01/29/16   Lorre Munroe, NP  cyclobenzaprine (FLEXERIL) 10 MG tablet Take 1 tablet (10 mg total) by mouth at bedtime as needed for muscle spasms. 05/30/16   Emi Belfast, FNP  cyclobenzaprine (FLEXERIL) 10 MG tablet Take 1 tablet (10 mg total) by mouth at bedtime as needed for muscle spasms. 12/12/16   Bedsole, Amy E, MD  diclofenac (VOLTAREN) 75 MG EC tablet Take 1 tablet (75 mg total) by mouth 2 (two) times daily. 12/12/16   Bedsole, Amy E, MD  Fluticasone-Salmeterol (ADVAIR) 250-50 MCG/DOSE AEPB Inhale 1 puff into the lungs daily. 08/08/15   Lorre Munroe, NP  hyoscyamine (LEVSIN, ANASPAZ) 0.125 MG tablet Take 1 tablet (0.125 mg total) by mouth every 6 (six) hours as needed. 05/26/14   Lorre Munroe, NP  meloxicam (MOBIC) 7.5 MG tablet Take 1 tablet (7.5 mg total) by mouth daily as needed for pain. 05/30/16   Emi Belfast, FNP  mometasone (NASONEX) 50 MCG/ACT nasal spray Place 2 sprays into the nose daily. 07/05/16   Joaquim Nam, MD  montelukast (SINGULAIR) 10 MG tablet TAKE 1 TABLET (10 MG TOTAL) BY MOUTH  AT BEDTIME. 09/27/15   Lorre Munroe, NP  niacin (NIASPAN) 1000 MG CR tablet Take 1 tablet (1,000 mg total) by mouth at bedtime. MUST SCHEDULE ANNUAL EXAM 12/10/16   Lorre Munroe, NP  omeprazole (PRILOSEC) 20 MG capsule Take 1 capsule (20 mg total) by mouth daily. 11/13/15   Lorre Munroe, NP  oxyCODONE-acetaminophen (PERCOCET) 7.5-325 MG tablet Take 1 tablet by mouth every 6 (six) hours as needed for severe pain. 12/14/16 12/14/17  Tommi Rumps, PA-C  sertraline (ZOLOFT) 100 MG tablet Take 1 tablet (100 mg total) by mouth daily. SCHEDULE ANNUAL PHYSICAL EXAM FOR July 2018 08/15/16   Lorre Munroe, NP  simvastatin  (ZOCOR) 20 MG tablet Take 1 tablet (20 mg total) by mouth daily at 6 PM. PLEASE SCHEDULE ANNUAL PHYSICAL 12/10/16   Lorre Munroe, NP  triamcinolone cream (KENALOG) 0.1 % Apply 1 application topically 2 (two) times daily as needed. 08/18/15   Karie Schwalbe, MD    Allergies Flonase [fluticasone propionate] and Sulfa antibiotics  Family History  Problem Relation Age of Onset  . COPD Mother   . Heart disease Mother   . Polymyalgia rheumatica Mother   . Emphysema Mother   . Stroke Mother   . Heart disease Father   . Hypertension Father   . Cancer Paternal Aunt        Breast  . Stroke Maternal Grandmother   . Cancer Maternal Aunt     Social History Social History  Substance Use Topics  . Smoking status: Never Smoker  . Smokeless tobacco: Never Used  . Alcohol use 0.0 oz/week     Comment: rare--wine    Review of Systems Constitutional: No fever/chills Eyes: No visual changes. ENT: No sore throat. Cardiovascular: Denies chest pain. Respiratory: Denies shortness of breath. Gastrointestinal:   No nausea, no vomiting.   Musculoskeletal: Negative for back pain. Positive for neck pain. Skin: Negative for rash. Neurological: Negative for headaches, focal weakness or numbness.   ____________________________________________   PHYSICAL EXAM:  VITAL SIGNS: ED Triage Vitals  Enc Vitals Group     BP 12/14/16 1105 133/74     Pulse Rate 12/14/16 1105 88     Resp 12/14/16 1105 20     Temp 12/14/16 1105 98 F (36.7 C)     Temp Source 12/14/16 1105 Oral     SpO2 12/14/16 1105 97 %     Weight 12/14/16 1106 245 lb (111.1 kg)     Height 12/14/16 1106 5' 7.5" (1.715 m)     Head Circumference --      Peak Flow --      Pain Score 12/14/16 1105 8     Pain Loc --      Pain Edu? --      Excl. in GC? --     Constitutional: Alert and oriented. Well appearing and in no acute distress. Eyes: Conjunctivae are normal. PERRL. EOMI. Head: Atraumatic. Nose: No  congestion/rhinnorhea. Neck: No stridor.  There is some minimal tenderness on palpation of the vertebral bodies cervical spine on palpation. There is no gross deformity noted. There is moderate tenderness on palpation of the trapezius muscles. Range of motion is slow and restricted secondary to discomfort. No soft tissue swelling noted. No ecchymosis, abrasions or erythema was noted. Hematological/Lymphatic/Immunilogical: No cervical lymphadenopathy. Cardiovascular: Normal rate, regular rhythm. Grossly normal heart sounds.  Good peripheral circulation. Respiratory: Normal respiratory effort.  No retractions. Lungs CTAB. Musculoskeletal: Moves upper and lower extremities without  any difficulty. Normal gait was noted. Good muscle strength upper extremities and lower extremities. Neurologic:  Normal speech and language. No gross focal neurologic deficits are appreciated. No gait instability. Skin:  Skin is warm, dry and intact. No rash noted. Psychiatric: Mood and affect are normal. Speech and behavior are normal.  ____________________________________________   LABS (all labs ordered are listed, but only abnormal results are displayed)  Labs Reviewed - No data to display   RADIOLOGY  Dg Cervical Spine 2-3 Views  Result Date: 12/14/2016 CLINICAL DATA:  Patient with neck pain. No reported injury. Initial encounter. EXAM: CERVICAL SPINE - 2-3 VIEW COMPARISON:  None. FINDINGS: Visualization through C7 on lateral view. Straightening of the normal cervical lordosis. Grade 1 anterolisthesis of C3 on C4 and C4 on C5. Preservation of the vertebral body and intervertebral disc space heights. Prevertebral soft tissues are unremarkable. Lung apices are unremarkable. Lateral masses articulate appropriately with the dens. IMPRESSION: Nonspecific grade 1 anterolisthesis of C3 on C4 and C4 on C5. Electronically Signed   By: Annia Belt M.D.   On: 12/14/2016 12:55     ____________________________________________   PROCEDURES  Procedure(s) performed: None  Procedures  Critical Care performed: No  ____________________________________________   INITIAL IMPRESSION / ASSESSMENT AND PLAN / ED COURSE  Pertinent labs & imaging results that were available during my care of the patient were reviewed by me and considered in my medical decision making (see chart for details).  Patient was given Norflex IM and Percocet 5 mg by mouth. Patient got some relief with that. She was sent for x-ray which confirmed that she had cervical straightening suggestive of muscle spasms. Patient is continue taking medication that her doctor prescribed which includes Flexeril 10 mg 3 times a day and diclofenac 75 mg twice a day with food. Patient was given a prescription for Percocet 7.5 one every 6 hours if needed for severe pain. We discussed alternating between ice and heat. She will call her PCP tomorrow if any continued problems.   ___________________________________________   FINAL CLINICAL IMPRESSION(S) / ED DIAGNOSES  Final diagnoses:  Torticollis, acute      NEW MEDICATIONS STARTED DURING THIS VISIT:  Discharge Medication List as of 12/14/2016  1:26 PM    START taking these medications   Details  oxyCODONE-acetaminophen (PERCOCET) 7.5-325 MG tablet Take 1 tablet by mouth every 6 (six) hours as needed for severe pain., Starting Sat 12/14/2016, Until Sun 12/14/2017, Print         Note:  This document was prepared using Dragon voice recognition software and may include unintentional dictation errors.    Tommi Rumps, PA-C 12/14/16 1402    Nita Sickle, MD 12/14/16 [phone]

## 2016-12-14 NOTE — ED Triage Notes (Signed)
Awoke with stiff neck 2 days ago, denies injury

## 2016-12-14 NOTE — Discharge Instructions (Signed)
Continue taking Flexeril as directed and diclofenac 75 mg twice a day with food. Take Percocet only as directed for pain. Do not drive or operate machinery while taking this medication. Use ice or heat to neck and shoulder muscles. Call your doctor if any continued problems for further testing.

## 2016-12-16 ENCOUNTER — Telehealth: Payer: Self-pay | Admitting: Internal Medicine

## 2016-12-16 ENCOUNTER — Ambulatory Visit (INDEPENDENT_AMBULATORY_CARE_PROVIDER_SITE_OTHER): Payer: 59 | Admitting: Family Medicine

## 2016-12-16 ENCOUNTER — Ambulatory Visit: Payer: 59 | Admitting: Internal Medicine

## 2016-12-16 ENCOUNTER — Encounter: Payer: Self-pay | Admitting: Family Medicine

## 2016-12-16 VITALS — BP 128/80 | HR 84 | Temp 98.7°F | Ht 67.5 in | Wt 243.5 lb

## 2016-12-16 DIAGNOSIS — M542 Cervicalgia: Secondary | ICD-10-CM

## 2016-12-16 DIAGNOSIS — M436 Torticollis: Secondary | ICD-10-CM | POA: Diagnosis not present

## 2016-12-16 MED ORDER — PREDNISONE 20 MG PO TABS: 40.0000 mg | ORAL_TABLET | Freq: Every day | ORAL | 0 refills | Status: DC

## 2016-12-16 MED ORDER — DEXAMETHASONE SODIUM PHOSPHATE 10 MG/ML IJ SOLN
10.0000 mg | Freq: Once | INTRAMUSCULAR | Status: AC
  Administered 2016-12-16: 10 mg via INTRAMUSCULAR

## 2016-12-16 NOTE — Addendum Note (Signed)
Addended by: Damita Lack on: 12/16/2016 12:23 PM   Modules accepted: Orders

## 2016-12-16 NOTE — Telephone Encounter (Signed)
[facility] Day - Client TELEPHONE ADVICE RECORD [facility]  Patient Name: Donna Vasquez  DOB: 09-Apr-1966    Initial Comment Caller states was seen Thursday for muscle spasms, went to ER on Sat because they were getting worse. They are still very bad   Nurse Assessment  Nurse: Odis Luster, RN, Bjorn Loser Date/Time (Eastern Time): 12/16/2016 9:14:56 AM  Confirm and document reason for call. If symptomatic, describe symptoms. ---Caller states was seen Thursday for muscle spasms in her shoulder, went to ER on Sat because they were getting worse. They are still very bad. Reports that she was given a muscle relaxer shot in her hip and given oxycodone in the ED. She was also given Flexeril and Diclofinac from MD office. She is not getting any better. Shoulder, neck and left arm pain.  Does the patient have any new or worsening symptoms? ---Yes  Will a triage be completed? ---Yes  Related visit to physician within the last 2 weeks? ---Yes  Does the PT have any chronic conditions? (i.e. diabetes, asthma, etc.) ---Yes  List chronic conditions. ---high chol; depression  Is the patient pregnant or possibly pregnant? (Ask all females between the ages of 70-55) ---No  Is this a behavioral health or substance abuse call? ---No     Guidelines    Guideline Title Affirmed Question Affirmed Notes       Final Disposition User        Comments  Appt scheduled for this morning at 10L30am with Lovie Macadamia at the [facility] office

## 2016-12-16 NOTE — Patient Instructions (Signed)
Good to see you. Please STOP taking diclofenac.  If you still have pain in 48-72 hours okay to start prednisone 40 mg daily with breakfast x 5 days.  Please keep me updated.

## 2016-12-16 NOTE — Telephone Encounter (Signed)
appt has been scheduled.

## 2016-12-16 NOTE — Progress Notes (Signed)
Subjective:   Patient ID: Donna Vasquez, female    DOB: 12/11/1965, 51 y.o.   MRN: [id number]  Donna Vasquez is a pleasant 51 y.o. year old female patient of Nicki Reaper, new to me, who presents to clinic today with muscles spasms  on 12/16/2016  HPI:  Saw Amy Bedsole on 12/12/16 for upper and low back pain.  Note reviewed.  Was started on diclofenac twice daily and flexeril as needed at bedtime for two weeks.  Two days later, 12/14/16, she went to the ER as pain was not improving.  Notes reviewed.   Dg Cervical Spine 2-3 Views  Result Date: 12/14/2016 CLINICAL DATA:  Patient with neck pain. No reported injury. Initial encounter. EXAM: CERVICAL SPINE - 2-3 VIEW COMPARISON:  None. FINDINGS: Visualization through C7 on lateral view. Straightening of the normal cervical lordosis. Grade 1 anterolisthesis of C3 on C4 and C4 on C5. Preservation of the vertebral body and intervertebral disc space heights. Prevertebral soft tissues are unremarkable. Lung apices are unremarkable. Lateral masses articulate appropriately with the dens. IMPRESSION: Nonspecific grade 1 anterolisthesis of C3 on C4 and C4 on C5. Electronically Signed   By: Annia Belt M.D.   On: 12/14/2016 12:55   Cervical xray showed cervical straightening which did confirm muscle spasms.  Given IM Norflex or one dose of oral percocet in the ER which did help with symptoms.  Send home with rx for percocet 7.5 to take every 6 hours as needed for severe pain.  She is here today because pain is no better.  Now having pain down her left arm, still cannot move her neck.  Current Outpatient Prescriptions on File Prior to Visit  Medication Sig Dispense Refill  . albuterol (PROAIR HFA) 108 (90 Base) MCG/ACT inhaler Inhale 1-2 puffs into the lungs every 6 (six) hours as needed for wheezing or shortness of breath. 1 Inhaler 2  . aspirin 81 MG tablet Take 81 mg by mouth daily.    . cetirizine (ZYRTEC) 10 MG tablet Take 10 mg by  mouth daily.  0  . Cholecalciferol (VITAMIN D-3) 1000 UNITS CAPS Take 3 capsules by mouth daily. 3000 units    . clonazePAM (KLONOPIN) 0.5 MG tablet TAKE 1 TABLET EVERY 6 HOURS AS NEEDED 30 tablet 0  . cyclobenzaprine (FLEXERIL) 10 MG tablet Take 1 tablet (10 mg total) by mouth at bedtime as needed for muscle spasms. 15 tablet 0  . diclofenac (VOLTAREN) 75 MG EC tablet Take 1 tablet (75 mg total) by mouth 2 (two) times daily. 30 tablet 0  . Fluticasone-Salmeterol (ADVAIR) 250-50 MCG/DOSE AEPB Inhale 1 puff into the lungs daily. 60 each 2  . hyoscyamine (LEVSIN, ANASPAZ) 0.125 MG tablet Take 1 tablet (0.125 mg total) by mouth every 6 (six) hours as needed. 30 tablet 2  . meloxicam (MOBIC) 7.5 MG tablet Take 1 tablet (7.5 mg total) by mouth daily as needed for pain. 30 tablet 1  . mometasone (NASONEX) 50 MCG/ACT nasal spray Place 2 sprays into the nose daily. 17 g 1  . montelukast (SINGULAIR) 10 MG tablet TAKE 1 TABLET (10 MG TOTAL) BY MOUTH AT BEDTIME. 30 tablet 2  . niacin (NIASPAN) 1000 MG CR tablet Take 1 tablet (1,000 mg total) by mouth at bedtime. MUST SCHEDULE ANNUAL EXAM 30 tablet 1  . omeprazole (PRILOSEC) 20 MG capsule Take 1 capsule (20 mg total) by mouth daily. 30 capsule 2  . oxyCODONE-acetaminophen (PERCOCET) 7.5-325 MG tablet Take 1 tablet by mouth  every 6 (six) hours as needed for severe pain. 15 tablet 0  . sertraline (ZOLOFT) 100 MG tablet Take 1 tablet (100 mg total) by mouth daily. SCHEDULE ANNUAL PHYSICAL EXAM FOR July 2018 30 tablet 3  . simvastatin (ZOCOR) 20 MG tablet Take 1 tablet (20 mg total) by mouth daily at 6 PM. PLEASE SCHEDULE ANNUAL PHYSICAL 30 tablet 1  . triamcinolone cream (KENALOG) 0.1 % Apply 1 application topically 2 (two) times daily as needed. 45 g 1   No current facility-administered medications on file prior to visit.     Allergies  Allergen Reactions  . Flonase [Fluticasone Propionate] Other (See Comments)    Nasal irritation, burning, but can tolerate  advair inhaler.    . Sulfa Antibiotics Hives    Past Medical History:  Diagnosis Date  . Allergy   . Asthma   . Chicken pox   . Frequent headaches   . Hyperlipidemia   . IBS (irritable bowel syndrome)   . Interstitial cystitis   . Urine incontinence     Past Surgical History:  Procedure Laterality Date  . CHOLECYSTECTOMY    . cyst removed from back    . NASAL SINUS SURGERY    . TUBAL LIGATION  1999    Family History  Problem Relation Age of Onset  . COPD Mother   . Heart disease Mother   . Polymyalgia rheumatica Mother   . Emphysema Mother   . Stroke Mother   . Heart disease Father   . Hypertension Father   . Cancer Paternal Aunt        Breast  . Stroke Maternal Grandmother   . Cancer Maternal Aunt     Social History   Social History  . Marital status: Married    Spouse name: N/A  . Number of children: y  . Years of education: N/A   Occupational History  . tacher assistant    Social History Main Topics  . Smoking status: Never Smoker  . Smokeless tobacco: Never Used  . Alcohol use 0.0 oz/week     Comment: rare--wine  . Drug use: No  . Sexual activity: Not on file   Other Topics Concern  . Not on file   Social History Narrative  . No narrative on file   The PMH, PSH, Social History, Family History, Medications, and allergies have been reviewed in [facility], and have been updated if relevant.   Review of Systems  Constitutional: Negative.   Gastrointestinal: Negative.   Musculoskeletal: Positive for back pain, neck pain and neck stiffness.  Neurological: Negative.   All other systems reviewed and are negative.      Objective:    BP 128/80   Pulse 84   Temp 98.7 F (37.1 C) (Oral)   Ht 5' 7.5" (1.715 m)   Wt 243 lb 8 oz (110.5 kg)   LMP 12/11/2016   BMI 37.57 kg/m    Physical Exam  Constitutional: She is oriented to person, place, and time. She appears well-developed and well-nourished. No distress.  HENT:  Head: Normocephalic and  atraumatic.  Cardiovascular: Normal rate.   Pulmonary/Chest: Effort normal.  Musculoskeletal:       Cervical back: She exhibits decreased range of motion, tenderness, pain and spasm. She exhibits no bony tenderness, no swelling, no edema, no deformity, no laceration and normal pulse.  Neurological: She is alert and oriented to person, place, and time. No cranial nerve deficit.  Skin: Skin is warm and dry. She is not  diaphoretic.  Psychiatric: She has a normal mood and affect. Her behavior is normal. Judgment and thought content normal.  Nursing note and vitals reviewed.         Assessment & Plan:   Neck pain, acute  Torticollis No Follow-up on file.

## 2016-12-16 NOTE — Assessment & Plan Note (Signed)
Deteriorated and now with signs of nerve impingement. IM decadron given in office. Advised to STOP taking NSAIDs. Ok to continue flexeril at bedtime if helping.  Start prednisone 40 mg daily with breakfast x 5 days if pain persists in 2-3 days. Call or return to clinic prn if these symptoms worsen or fail to improve as anticipated. The patient indicates understanding of these issues and agrees with the plan.

## 2016-12-26 ENCOUNTER — Ambulatory Visit (INDEPENDENT_AMBULATORY_CARE_PROVIDER_SITE_OTHER): Payer: 59 | Admitting: Family Medicine

## 2016-12-26 ENCOUNTER — Encounter: Payer: Self-pay | Admitting: Family Medicine

## 2016-12-26 VITALS — BP 130/80 | HR 81 | Ht 67.5 in | Wt 245.0 lb

## 2016-12-26 DIAGNOSIS — M79602 Pain in left arm: Secondary | ICD-10-CM | POA: Diagnosis not present

## 2016-12-26 DIAGNOSIS — M542 Cervicalgia: Secondary | ICD-10-CM | POA: Diagnosis not present

## 2016-12-26 NOTE — Progress Notes (Signed)
Subjective:   Patient ID: Donna Vasquez, female    DOB: 05/23/1965, 51 y.o.   MRN: [id number]  Donna Vasquez is a pleasant 51 y.o. year old female who presents to clinic today with Arm Pain (left, no improvement since last time she was here. )  on 12/26/2016  HPI:  Saw her for ER follow up of neck pain/muscle spasms on 12/16/16.  Note reviewed.  Cervical xray showed cervical straightening which did confirm muscle spasms.  Given IM Norflex or one dose of oral percocet in the ER which did help with symptoms.  Send home with rx for percocet 7.5 to take every 6 hours as needed for severe pain.  When I saw her she still have very limited ROM of her neck along with significant pain.  Given IM decadron in office, advised to STOP taking NSAIDs, continue flexeril and to start oral prednisone in 2 days if pain persisted.  She took the additional course of prednisone.  She does have an improvement of range of motion but neck constantly aches and she is still having pain/radiculopathy down her left upper arm, forearm and hand. No UE weakness.  Current Outpatient Prescriptions on File Prior to Visit  Medication Sig Dispense Refill  . albuterol (PROAIR HFA) 108 (90 Base) MCG/ACT inhaler Inhale 1-2 puffs into the lungs every 6 (six) hours as needed for wheezing or shortness of breath. 1 Inhaler 2  . aspirin 81 MG tablet Take 81 mg by mouth daily.    . cetirizine (ZYRTEC) 10 MG tablet Take 10 mg by mouth daily.  0  . Cholecalciferol (VITAMIN D-3) 1000 UNITS CAPS Take 3 capsules by mouth daily. 3000 units    . clonazePAM (KLONOPIN) 0.5 MG tablet TAKE 1 TABLET EVERY 6 HOURS AS NEEDED 30 tablet 0  . cyclobenzaprine (FLEXERIL) 10 MG tablet Take 1 tablet (10 mg total) by mouth at bedtime as needed for muscle spasms. 15 tablet 0  . Fluticasone-Salmeterol (ADVAIR) 250-50 MCG/DOSE AEPB Inhale 1 puff into the lungs daily. 60 each 2  . hyoscyamine (LEVSIN, ANASPAZ) 0.125 MG tablet Take 1 tablet  (0.125 mg total) by mouth every 6 (six) hours as needed. 30 tablet 2  . meloxicam (MOBIC) 7.5 MG tablet Take 1 tablet (7.5 mg total) by mouth daily as needed for pain. 30 tablet 1  . mometasone (NASONEX) 50 MCG/ACT nasal spray Place 2 sprays into the nose daily. 17 g 1  . montelukast (SINGULAIR) 10 MG tablet TAKE 1 TABLET (10 MG TOTAL) BY MOUTH AT BEDTIME. 30 tablet 2  . niacin (NIASPAN) 1000 MG CR tablet Take 1 tablet (1,000 mg total) by mouth at bedtime. MUST SCHEDULE ANNUAL EXAM 30 tablet 1  . omeprazole (PRILOSEC) 20 MG capsule Take 1 capsule (20 mg total) by mouth daily. 30 capsule 2  . oxyCODONE-acetaminophen (PERCOCET) 7.5-325 MG tablet Take 1 tablet by mouth every 6 (six) hours as needed for severe pain. 15 tablet 0  . predniSONE (DELTASONE) 20 MG tablet Take 2 tablets (40 mg total) by mouth daily with breakfast. 10 tablet 0  . sertraline (ZOLOFT) 100 MG tablet Take 1 tablet (100 mg total) by mouth daily. SCHEDULE ANNUAL PHYSICAL EXAM FOR July 2018 30 tablet 3  . simvastatin (ZOCOR) 20 MG tablet Take 1 tablet (20 mg total) by mouth daily at 6 PM. PLEASE SCHEDULE ANNUAL PHYSICAL 30 tablet 1  . triamcinolone cream (KENALOG) 0.1 % Apply 1 application topically 2 (two) times daily as needed. 45 g 1  No current facility-administered medications on file prior to visit.     Allergies  Allergen Reactions  . Flonase [Fluticasone Propionate] Other (See Comments)    Nasal irritation, burning, but can tolerate advair inhaler.    . Sulfa Antibiotics Hives    Past Medical History:  Diagnosis Date  . Allergy   . Asthma   . Chicken pox   . Frequent headaches   . Hyperlipidemia   . IBS (irritable bowel syndrome)   . Interstitial cystitis   . Urine incontinence     Past Surgical History:  Procedure Laterality Date  . CHOLECYSTECTOMY    . cyst removed from back    . NASAL SINUS SURGERY    . TUBAL LIGATION  1999    Family History  Problem Relation Age of Onset  . COPD Mother   .  Heart disease Mother   . Polymyalgia rheumatica Mother   . Emphysema Mother   . Stroke Mother   . Heart disease Father   . Hypertension Father   . Cancer Paternal Aunt        Breast  . Stroke Maternal Grandmother   . Cancer Maternal Aunt     Social History   Social History  . Marital status: Married    Spouse name: N/A  . Number of children: y  . Years of education: N/A   Occupational History  . tacher assistant    Social History Main Topics  . Smoking status: Never Smoker  . Smokeless tobacco: Never Used  . Alcohol use 0.0 oz/week     Comment: rare--wine  . Drug use: No  . Sexual activity: Not on file   Other Topics Concern  . Not on file   Social History Narrative  . No narrative on file   The PMH, PSH, Social History, Family History, Medications, and allergies have been reviewed in [facility], and have been updated if relevant.   Review of Systems  Musculoskeletal: Positive for neck pain. Negative for neck stiffness.  Skin: Negative.   Neurological: Positive for numbness. Negative for dizziness, facial asymmetry, weakness and headaches.  All other systems reviewed and are negative.      Objective:    BP 130/80   Pulse 81   Ht 5' 7.5" (1.715 m)   Wt 245 lb (111.1 kg)   LMP 12/11/2016   SpO2 98%   BMI 37.81 kg/m    Physical Exam  Constitutional: She is oriented to person, place, and time. She appears well-developed and well-nourished. No distress.  HENT:  Head: Normocephalic and atraumatic.  Eyes: Conjunctivae are normal.  Cardiovascular: Normal rate.   Pulmonary/Chest: Effort normal.  Musculoskeletal:       Cervical back: She exhibits tenderness and pain. She exhibits normal range of motion, no bony tenderness, no swelling, no edema, no deformity, no laceration and normal pulse.  Neurological: She is alert and oriented to person, place, and time. No cranial nerve deficit.  Skin: Skin is warm and dry. She is not diaphoretic.  Psychiatric: She has a  normal mood and affect. Her behavior is normal. Judgment and thought content normal.  Nursing note and vitals reviewed.         Assessment & Plan:   Neck pain, acute  Left arm pain  Cervicalgia - Plan: MR Cervical Spine Wo Contrast No Follow-up on file.

## 2016-12-26 NOTE — Assessment & Plan Note (Signed)
ROM improved but having persistent pain and radiculopathy. Failed conservative tx. Proceed with MRI. The patient indicates understanding of these issues and agrees with the plan.

## 2016-12-26 NOTE — Patient Instructions (Signed)
Great to see you. Please stop by to see Donna Vasquez on your way out.   

## 2016-12-31 ENCOUNTER — Other Ambulatory Visit: Payer: Self-pay | Admitting: Family Medicine

## 2016-12-31 ENCOUNTER — Ambulatory Visit
Admission: RE | Admit: 2016-12-31 | Discharge: 2016-12-31 | Disposition: A | Discharge status: Home / Self Care | Payer: 59 | Source: Ambulatory Visit | Attending: Family Medicine | Admitting: Family Medicine

## 2016-12-31 DIAGNOSIS — M5020 Other cervical disc displacement, unspecified cervical region: Secondary | ICD-10-CM

## 2016-12-31 DIAGNOSIS — M542 Cervicalgia: Secondary | ICD-10-CM

## 2016-12-31 DIAGNOSIS — M50223 Other cervical disc displacement at C6-C7 level: Secondary | ICD-10-CM | POA: Diagnosis not present

## 2016-12-31 MED ORDER — PREDNISONE 20 MG PO TABS: ORAL_TABLET | ORAL | 0 refills | Status: DC

## 2017-01-01 ENCOUNTER — Encounter: Payer: Self-pay | Admitting: Emergency Medicine

## 2017-01-01 ENCOUNTER — Emergency Department
Admission: EM | Admit: 2017-01-01 | Discharge: 2017-01-01 | Disposition: A | Discharge status: Home / Self Care | Payer: 59 | Attending: Emergency Medicine | Admitting: Emergency Medicine

## 2017-01-01 DIAGNOSIS — M5412 Radiculopathy, cervical region: Secondary | ICD-10-CM

## 2017-01-01 DIAGNOSIS — M542 Cervicalgia: Secondary | ICD-10-CM | POA: Diagnosis present

## 2017-01-01 DIAGNOSIS — J45909 Unspecified asthma, uncomplicated: Secondary | ICD-10-CM | POA: Diagnosis not present

## 2017-01-01 DIAGNOSIS — M50123 Cervical disc disorder at C6-C7 level with radiculopathy: Secondary | ICD-10-CM | POA: Diagnosis not present

## 2017-01-01 DIAGNOSIS — Z79899 Other long term (current) drug therapy: Secondary | ICD-10-CM | POA: Diagnosis not present

## 2017-01-01 DIAGNOSIS — Z7982 Long term (current) use of aspirin: Secondary | ICD-10-CM | POA: Insufficient documentation

## 2017-01-01 MED ORDER — METHYLPREDNISOLONE SODIUM SUCC 125 MG IJ SOLR
125.0000 mg | Freq: Once | INTRAMUSCULAR | Status: AC
  Administered 2017-01-01: 125 mg via INTRAVENOUS
  Filled 2017-01-01: qty 2

## 2017-01-01 MED ORDER — MORPHINE SULFATE (PF) 4 MG/ML IV SOLN
4.0000 mg | Freq: Once | INTRAVENOUS | Status: AC
  Administered 2017-01-01: 4 mg via INTRAVENOUS
  Filled 2017-01-01: qty 1

## 2017-01-01 NOTE — ED Provider Notes (Signed)
[facility] Emergency Department Provider Note    First MD Initiated Contact with Patient 01/01/17 [phone]     (approximate)  I have reviewed the triage vital signs and the nursing notes.   HISTORY  Chief Complaint Torticollis    HPI Donna Vasquez is a 51 y.o. female with below list of chronic medical conditions including recently diagnosed cervical radiculopathy with a large disc bulge C6-C7 confirmed on MRI from 12/31/2016 presents to the emergency department worsening posterior neck pain with radiation down the left arm extending into the left fourth and fifth digits. Patient states her current pain score is 8 out of 10. Patient states that the pain is worse with movement. Patient denies any alleviating factors. Patient states that she was prescribed prednisone yesterday however she has not taken her first dose    Past Medical History:  Diagnosis Date  . Allergy   . Asthma   . Chicken pox   . Frequent headaches   . Hyperlipidemia   . IBS (irritable bowel syndrome)   . Interstitial cystitis   . Urine incontinence     Patient Active Problem List   Diagnosis Date Noted  . Left arm pain 12/26/2016  . Torticollis 12/16/2016  . Neck pain, acute 12/12/2016  . OSA (obstructive sleep apnea) 08/19/2014  . IBS (irritable bowel syndrome) 05/26/2014  . Gastroesophageal reflux disease without esophagitis 05/26/2014  . Allergy-induced asthma 05/26/2014  . Insomnia 05/26/2014  . HLD (hyperlipidemia) 05/26/2014  . Low back pain 12/24/2010    Past Surgical History:  Procedure Laterality Date  . CHOLECYSTECTOMY    . cyst removed from back    . NASAL SINUS SURGERY    . TUBAL LIGATION  1999    Prior to Admission medications   Medication Sig Start Date End Date Taking? Authorizing Provider  albuterol (PROAIR HFA) 108 (90 Base) MCG/ACT inhaler Inhale 1-2 puffs into the lungs every 6 (six) hours as needed for wheezing or shortness of breath. 07/05/16    Joaquim Nam, MD  aspirin 81 MG tablet Take 81 mg by mouth daily.    [provider]  cetirizine (ZYRTEC) 10 MG tablet Take 10 mg by mouth daily. 08/04/15   [provider]  Cholecalciferol (VITAMIN D-3) 1000 UNITS CAPS Take 3 capsules by mouth daily. 3000 units    [provider]  clonazePAM (KLONOPIN) 0.5 MG tablet TAKE 1 TABLET EVERY 6 HOURS AS NEEDED 01/29/16   Lorre Munroe, NP  cyclobenzaprine (FLEXERIL) 10 MG tablet Take 1 tablet (10 mg total) by mouth at bedtime as needed for muscle spasms. 12/12/16   Bedsole, Amy E, MD  Fluticasone-Salmeterol (ADVAIR) 250-50 MCG/DOSE AEPB Inhale 1 puff into the lungs daily. 08/08/15   Lorre Munroe, NP  hyoscyamine (LEVSIN, ANASPAZ) 0.125 MG tablet Take 1 tablet (0.125 mg total) by mouth every 6 (six) hours as needed. 05/26/14   Lorre Munroe, NP  meloxicam (MOBIC) 7.5 MG tablet Take 1 tablet (7.5 mg total) by mouth daily as needed for pain. 05/30/16   Emi Belfast, FNP  mometasone (NASONEX) 50 MCG/ACT nasal spray Place 2 sprays into the nose daily. 07/05/16   Joaquim Nam, MD  montelukast (SINGULAIR) 10 MG tablet TAKE 1 TABLET (10 MG TOTAL) BY MOUTH AT BEDTIME. 09/27/15   Lorre Munroe, NP  niacin (NIASPAN) 1000 MG CR tablet Take 1 tablet (1,000 mg total) by mouth at bedtime. MUST SCHEDULE ANNUAL EXAM 12/10/16   Lorre Munroe,  NP  omeprazole (PRILOSEC) 20 MG capsule Take 1 capsule (20 mg total) by mouth daily. 11/13/15   Lorre Munroe, NP  oxyCODONE-acetaminophen (PERCOCET) 7.5-325 MG tablet Take 1 tablet by mouth every 6 (six) hours as needed for severe pain. 12/14/16 12/14/17  Tommi Rumps, PA-C  predniSONE (DELTASONE) 20 MG tablet 2 tabs by mouth x 3 days, 1 tab by mouth x 3 days, 1/2 tab by mouth x 3 days 12/31/16   Dianne Dun, MD  sertraline (ZOLOFT) 100 MG tablet Take 1 tablet (100 mg total) by mouth daily. SCHEDULE ANNUAL PHYSICAL EXAM FOR July 2018 08/15/16   Lorre Munroe, NP  simvastatin (ZOCOR) 20  MG tablet Take 1 tablet (20 mg total) by mouth daily at 6 PM. PLEASE SCHEDULE ANNUAL PHYSICAL 12/10/16   Lorre Munroe, NP  triamcinolone cream (KENALOG) 0.1 % Apply 1 application topically 2 (two) times daily as needed. 08/18/15   Karie Schwalbe, MD    Allergies Flonase [fluticasone propionate] and Sulfa antibiotics  Family History  Problem Relation Age of Onset  . COPD Mother   . Heart disease Mother   . Polymyalgia rheumatica Mother   . Emphysema Mother   . Stroke Mother   . Heart disease Father   . Hypertension Father   . Cancer Paternal Aunt        Breast  . Stroke Maternal Grandmother   . Cancer Maternal Aunt     Social History Social History  Substance Use Topics  . Smoking status: Never Smoker  . Smokeless tobacco: Never Used  . Alcohol use 0.0 oz/week     Comment: rare--wine    Review of Systems Constitutional: No fever/chills Eyes: No visual changes. ENT: No sore throat. Cardiovascular: Denies chest pain. Respiratory: Denies shortness of breath. Gastrointestinal: No abdominal pain.  No nausea, no vomiting.  No diarrhea.  No constipation. Genitourinary: Negative for dysuria. Musculoskeletal: Positive for neck pain.  Negative for back pain. Integumentary: Negative for rash. Neurological: Negative for headaches, focal weakness or numbness.   ____________________________________________   PHYSICAL EXAM:  VITAL SIGNS: ED Triage Vitals  Enc Vitals Group     BP 01/01/17 0359 133/86     Pulse Rate 01/01/17 0359 82     Resp 01/01/17 0359 20     Temp 01/01/17 0359 97.9 F (36.6 C)     Temp Source 01/01/17 0359 Oral     SpO2 01/01/17 0359 98 %     Weight 01/01/17 0358 108.9 kg (240 lb)     Height 01/01/17 0358 1.702 m (5\' 7" )     Head Circumference --      Peak Flow --      Pain Score 01/01/17 0358 9     Pain Loc --      Pain Edu? --      Excl. in GC? --     Constitutional: Alert and oriented. Well appearing and in no acute distress. Eyes:  Conjunctivae are normal. Head: Atraumatic. Mouth/Throat: Mucous membranes are moist.  Oropharynx non-erythematous. Neck: No stridor.  No cervical spine tenderness to palpation. Cardiovascular: Normal rate, regular rhythm. Good peripheral circulation. Grossly normal heart sounds. Respiratory: Normal respiratory effort.  No retractions. Lungs CTAB. Gastrointestinal: Soft and nontender. No distention.  Musculoskeletal: No lower extremity tenderness nor edema. No gross deformities of extremities. Neurologic:  Normal speech and language. No gross focal neurologic deficits are appreciated.  Skin:  Skin is warm, dry and intact. No rash noted. Psychiatric: Mood and  affect are normal. Speech and behavior are normal.   RADIOLOGY I, [place] N Renuka Farfan, personally viewed and evaluated these images (plain radiographs) as part of my medical decision making, as well as reviewing the written report by the radiologist.  Mr Cervical Spine Wo Contrast  Result Date: 12/31/2016 CLINICAL DATA:  Left neck pain radiating into the left arm to the little finger of the left hand. No known injury. EXAM: MRI CERVICAL SPINE WITHOUT CONTRAST TECHNIQUE: Multiplanar, multisequence MR imaging of the cervical spine was performed. No intravenous contrast was administered. COMPARISON:  Plain film cervical spine 12/14/2016. FINDINGS: Alignment: Maintained. Vertebrae: No fracture or worrisome lesion. Cord: Normal signal throughout. Posterior Fossa, vertebral arteries, paraspinal tissues: Negative. Disc levels: C2-3:  Negative. C3-4:  Negative. C4-5: Minimal disc bulge and uncovertebral disease without stenosis. C5-6: Minimal disc bulge and uncovertebral disease without stenosis. C6-7: The patient has a large disc protrusion on the left in the foraminal entry zone. The disc impinges on the left C7 root as it enters the foramen. The disc also mildly deflects the cord to the right. The right foramen is open. C7-T1:  Negative. IMPRESSION:  Large left-sided disc protrusion in the foraminal entry zone at C6-7 impinges on the left C7 root as it enters the foramen and slightly deflects the cord to the right. Electronically Signed   By: Drusilla Kanner M.D.   On: 12/31/2016 12:45      Procedures   ____________________________________________   INITIAL IMPRESSION / ASSESSMENT AND PLAN / ED COURSE  Pertinent labs & imaging results that were available during my care of the patient were reviewed by me and considered in my medical decision making (see chart for details).  Patient given IV morphine 4 mg and Solu-Medrol 125 mg in the emergency department with complete resolution of pain. Patient is advised to follow-up with Dr. Myer Haff neurosurgeon on call.      ____________________________________________  FINAL CLINICAL IMPRESSION(S) / ED DIAGNOSES  Final diagnoses:  Cervical radiculopathy at C7     MEDICATIONS GIVEN DURING THIS VISIT:  Medications  morphine 4 MG/ML injection 4 mg (4 mg Intravenous Given 01/01/17 0635)  methylPREDNISolone sodium succinate (SOLU-MEDROL) 125 mg/2 mL injection 125 mg (125 mg Intravenous Given 01/01/17 0635)     NEW OUTPATIENT MEDICATIONS STARTED DURING THIS VISIT:  New Prescriptions   No medications on file    Modified Medications   No medications on file    Discontinued Medications   No medications on file     Note:  This document was prepared using Dragon voice recognition software and may include unintentional dictation errors.    Darci Current, MD 01/02/17 [phone]

## 2017-01-01 NOTE — ED Triage Notes (Signed)
Patient ambulatory to triage with steady gait, without difficulty or distress noted; pt st has been dx with bulging disk and having increased pain with left sided HA; seen here on 7/28 for same

## 2017-01-01 NOTE — ED Notes (Signed)

## 2017-01-03 ENCOUNTER — Telehealth: Payer: Self-pay | Admitting: *Deleted

## 2017-01-03 NOTE — Telephone Encounter (Signed)
Patient called and left a voicemail wanting to update you on what has been going on. Patient stated that she saw you for a bulging disc. Patient stated that she ended up in the ER Wednesday and was given a shot of steroids. Patient stated that she saw Dr. Marcell Barlow at [facility] and she has opt out of surgery at this point.  Patient stated that she has an appointment Monday with the pain clinic about getting steroid shots in her back and having physical therapy.  Patient stated that she is doing some better. Patient stated that you can call her to discuss this when you can.

## 2017-01-06 ENCOUNTER — Encounter: Payer: Self-pay | Admitting: Student in an Organized Health Care Education/Training Program

## 2017-01-06 ENCOUNTER — Ambulatory Visit
Payer: 59 | Attending: Student in an Organized Health Care Education/Training Program | Admitting: Student in an Organized Health Care Education/Training Program

## 2017-01-06 VITALS — BP 148/66 | HR 78 | Temp 98.6°F | Resp 16 | Ht 67.0 in | Wt 240.0 lb

## 2017-01-06 DIAGNOSIS — Z87442 Personal history of urinary calculi: Secondary | ICD-10-CM | POA: Insufficient documentation

## 2017-01-06 DIAGNOSIS — M50223 Other cervical disc displacement at C6-C7 level: Secondary | ICD-10-CM | POA: Insufficient documentation

## 2017-01-06 DIAGNOSIS — M791 Myalgia: Secondary | ICD-10-CM | POA: Diagnosis not present

## 2017-01-06 DIAGNOSIS — M069 Rheumatoid arthritis, unspecified: Secondary | ICD-10-CM | POA: Insufficient documentation

## 2017-01-06 DIAGNOSIS — G8929 Other chronic pain: Secondary | ICD-10-CM | POA: Insufficient documentation

## 2017-01-06 DIAGNOSIS — M542 Cervicalgia: Secondary | ICD-10-CM

## 2017-01-06 DIAGNOSIS — M7918 Myalgia, other site: Secondary | ICD-10-CM

## 2017-01-06 DIAGNOSIS — E785 Hyperlipidemia, unspecified: Secondary | ICD-10-CM | POA: Insufficient documentation

## 2017-01-06 DIAGNOSIS — M419 Scoliosis, unspecified: Secondary | ICD-10-CM | POA: Insufficient documentation

## 2017-01-06 DIAGNOSIS — K219 Gastro-esophageal reflux disease without esophagitis: Secondary | ICD-10-CM | POA: Insufficient documentation

## 2017-01-06 DIAGNOSIS — K589 Irritable bowel syndrome without diarrhea: Secondary | ICD-10-CM | POA: Insufficient documentation

## 2017-01-06 DIAGNOSIS — R32 Unspecified urinary incontinence: Secondary | ICD-10-CM | POA: Insufficient documentation

## 2017-01-06 DIAGNOSIS — M5412 Radiculopathy, cervical region: Secondary | ICD-10-CM | POA: Diagnosis not present

## 2017-01-06 DIAGNOSIS — J45909 Unspecified asthma, uncomplicated: Secondary | ICD-10-CM | POA: Insufficient documentation

## 2017-01-06 DIAGNOSIS — M436 Torticollis: Secondary | ICD-10-CM | POA: Diagnosis not present

## 2017-01-06 DIAGNOSIS — G4733 Obstructive sleep apnea (adult) (pediatric): Secondary | ICD-10-CM | POA: Insufficient documentation

## 2017-01-06 DIAGNOSIS — E079 Disorder of thyroid, unspecified: Secondary | ICD-10-CM | POA: Diagnosis not present

## 2017-01-06 DIAGNOSIS — Z7982 Long term (current) use of aspirin: Secondary | ICD-10-CM | POA: Insufficient documentation

## 2017-01-06 NOTE — Progress Notes (Signed)
Safety precautions to be maintained throughout the outpatient stay will include: orient to surroundings, keep bed in low position, maintain call bell within reach at all times, provide assistance with transfer out of bed and ambulation.  

## 2017-01-06 NOTE — Patient Instructions (Addendum)
It was nice meeting you today. We discussed the potential risks and benefits of a cervical epidural steroid injection to help out with her neck and arm pain. We will go ahead and get she scheduled for this. Please stop your Aspirin 5 days prior to your scheduled procedure.   Preparing for Procedure with Sedation Instructions: . Oral Intake: Do not eat or drink anything for at least 8 hours prior to your procedure. . Transportation: Public transportation is not allowed. Bring an adult driver. The driver must be physically present in our waiting room before any procedure can be started. [name] Physical Assistance: Bring an adult capable of physically assisting you, in the event you need help. . Blood Pressure Medicine: Take your blood pressure medicine with a sip of water the morning of the procedure. . Insulin: Take only  of your normal insulin dose. . Preventing infections: Shower with an antibacterial soap the morning of your procedure. . Build-up your immune system: Take 1000 mg of Vitamin C with every meal (3 times a day) the day prior to your procedure. . Pregnancy: If you are pregnant, call and cancel the procedure. . Sickness: If you have a cold, fever, or any active infections, call and cancel the procedure. . Arrival: You must be in the facility at least 30 minutes prior to your scheduled procedure. . Children: Do not bring children with you. . Dress appropriately: Bring dark clothing that you would not mind if they get stained. . Valuables: Do not bring any jewelry or valuables. Procedure appointments are reserved for interventional treatments only. [name] No Prescription Refills. . No medication changes will be discussed during procedure appointments. No disability issues will be discussed. Epidural Steroid Injection An epidural steroid injection is given to relieve pain in your neck, back, or legs that is caused by the irritation or swelling of a nerve root. This procedure involves injecting  a steroid and numbing medicine (anesthetic) into the epidural space. The epidural space is the space between the outer covering of your spinal cord and the bones that form your backbone (vertebra).  LET [facility] CARE PROVIDER KNOW ABOUT:   Any allergies you have.  All medicines you are taking, including vitamins, herbs, eye drops, creams, and over-the-counter medicines such as aspirin.  Previous problems you or members of your family have had with the use of anesthetics.  Any blood disorders or blood clotting disorders you have.  Previous surgeries you have had.  Medical conditions you have.  RISKS AND COMPLICATIONS Generally, this is a safe procedure. However, as with any procedure, complications can occur. Possible complications of epidural steroid injection include:  Headache.  Bleeding.  Infection.  Allergic reaction to the medicines.  Damage to your nerves. The response to this procedure depends on the underlying cause of the pain and its duration. People who have long-term (chronic) pain are less likely to benefit from epidural steroids than are those people whose pain comes on strong and suddenly.  BEFORE THE PROCEDURE   Ask your health care provider about changing or stopping your regular medicines. You may be advised to stop taking blood-thinning medicines a few days before the procedure.  You may be given medicines to reduce anxiety.  Arrange for someone to take you home after the procedure.  PROCEDURE   You will remain awake during the procedure. You may receive medicine to make you relaxed.  You will be asked to lie on your stomach.  The injection site will be cleaned.  The  injection site will be numbed with a medicine (local anesthetic).  A needle will be injected through your skin into the epidural space.  Your health care provider will use an X-ray machine to ensure that the steroid is delivered closest to the affected nerve. You may have minimal  discomfort at this time.  Once the needle is in the right position, the local anesthetic and the steroid will be injected into the epidural space.  The needle will then be removed and a bandage will be applied to the injection site.  AFTER THE PROCEDURE   You may be monitored for a short time before you go home.  You may feel weakness or numbness in your arm or leg, which disappears within hours.  You may be allowed to eat, drink, and take your regular medicine.  You may have soreness at the site of the injection.   This information is not intended to replace advice given to you by your health care provider. Make sure you discuss any questions you have with your health care provider.   Document Released: 08/13/2007 Document Revised: 01/06/2013 Document Reviewed: 10/23/2012 Else[employer] Interactive Patient Education 2016 [employer]  GENERAL RISKS AND COMPLICATIONS  What are the risk, side effects and possible complications? Generally speaking, most procedures are safe.  However, with any procedure there are risks, side effects, and the possibility of complications.  The risks and complications are dependent upon the sites that are lesioned, or the type of nerve block to be performed.  The closer the procedure is to the spine, the more serious the risks are.  Great care is taken when placing the radio frequency needles, block needles or lesioning probes, but sometimes complications can occur. Infection: Any time there is an injection through the skin, there is a risk of infection.  This is why sterile conditions are used for these blocks. There are four possible types of infection: 1. Localized skin infection. 2. Central Nervous System Infection: This can be in the form of Meningitis, which can be deadly. 3. Epidural Infections: This can be in the form of an epidural abscess, which can cause pressure inside of the spine, causing compression of the spinal cord with subsequent paralysis. This  would require an emergency surgery to decompress, and there are no guarantees that the patient would recover from the paralysis. 4. Discitis: This is an infection of the intervertebral discs. It occurs in about 1% of discography procedures. It is difficult to treat and it may lead to surgery. Pain: the needles have to go through skin and soft tissues, will cause soreness. Damage to internal structures:  The nerves to be lesioned may be near blood vessels or other nerves which can be potentially damaged. Bleeding: Bleeding is more common if the patient is taking blood thinners such as  aspirin, Coumadin, Ticiid, Plavix, etc., or if he/she have some genetic predisposition such as hemophilia. Bleeding into the spinal canal can cause compression of the spinal  cord with subsequent paralysis.  This would require an emergency surgery to decompress and there are no guarantees that the patient would recover from the paralysis. Pneumothorax: Puncturing of a lung is a possibility, every time a needle is introduced in the area of the chest or upper back.  Pneumothorax refers to free air around the collapsed lung(s), inside of the thoracic cavity (chest cavity).  Another two possible complications related to a similar event would include: Hemothorax and Chylothorax. These are variations of the Pneumothorax, where instead of air  around the collapsed lung(s), you may have blood or chyle, respectively. Spinal headaches: They may occur with any procedures in the area of the spine. Persistent CSF (Cerebro-Spinal Fluid) leakage: This is a rare problem, but may occur with prolonged intrathecal or epidural catheters either due to the formation of a fistulous track or a dural tear. Nerve damage: By working so close to the spinal cord, there is always a possibility of nerve damage, which could be as serious as a permanent spinal cord injury with paralysis. Death: Although rare, severe deadly allergic reactions known as  "Anaphylactic reaction" can occur to any of the medications used. Worsening of the symptoms: We can always make thing worse.  What are the chances of something like this happening? Chances of any of this occuring are extremely low.  By statistics, you have more of a chance of getting killed in a motor vehicle accident: while driving to the hospital than any of the above occurring .  Nevertheless, you should be aware that they are possibilities.  In general, it is similar to taking a shower.  Everybody knows that you can slip, hit your head and get killed.  Does that mean that you should not shower again?  Nevertheless always keep in mind that statistics do not mean anything if you happen to be on the wrong side of them.  Even if a procedure has a 1 (one) in a 1,000,000 (million) chance of going wrong, it you happen to be that one..Also, keep in mind that by statistics, you have more of a chance of having something go wrong when taking medications.  Who should not have this procedure? If you are on a blood thinning medication (e.g. Coumadin, Plavix, see list of "Blood Thinners"), or if you have an active infection going on, you should not have the procedure.  If you are taking any blood thinners, please inform your physician.  Preparing for your procedure: 1. Do not eat or drink anything at least eight (8) hours prior to the procedure. 2. Bring a driver with you .  It cannot be a taxi. 3. Come accompanied by an adult that can drive you back, and that is strong enough to help you if your legs get weak or numb from the local anesthetic. 4. Take all of your medicines the morning of the procedure with just enough water to swallow them. 5. If you have diabetes, make sure that you are scheduled to have your procedure done first thing in the morning, whenever possible. 6. If you have diabetes, take only half of your insulin dose and notify our nurse that you have done so as soon as you arrive at the  clinic. 7. If you are diabetic, but only take blood sugar pills (oral hypoglycemic), then do not take them on the morning of your procedure.  You may take them after you have had the procedure. 8. Do not take aspirin or any aspirin-containing medications, at least eleven (11) days prior to the procedure.  They may prolong bleeding. 9. Wear loose fitting clothing that may be easy to take off and that you would not mind if it got stained with Betadine or blood. 10. Do not wear any jewelry or perfume 11. Remove any nail coloring.  It will interfere with some of our monitoring equipment. 12. If you take Metformin for your diabetes, stop it 48 hours prior to the procedure.  NOTE: Remember that this is not meant to be interpreted as a complete list of  all possible complications.  Unforeseen problems may occur.  BLOOD THINNERS The following drugs contain aspirin or other products, which can cause increased bleeding during surgery and should not be taken for 2 weeks prior to and 1 week after surgery.  If you should need take something for relief of minor pain, you may take acetaminophen which is found in Tylenol,m Datril, Anacin-3 and Panadol. It is not blood thinner. The products listed below are.  Do not take any of the products listed below in addition to any listed on your instruction sheet.  A.P.C or A.P.C with Codeine Codeine Phosphate Capsules #3 Ibuprofen Ridaura  ABC compound Congesprin Imuran rimadil  Advil Cope Indocin Robaxisal  Alka-Seltzer Effervescent Pain Reliever and Antacid Coricidin or Coricidin-D  Indomethacin Rufen  Alka-Seltzer plus Cold Medicine Cosprin Ketoprofen S-A-C Tablets  Anacin Analgesic Tablets or Capsules Coumadin Korlgesic Salflex  Anacin Extra Strength Analgesic tablets or capsules CP-2 Tablets Lanoril Salicylate  Anaprox Cuprimine Capsules Levenox Salocol  Anexsia-D Dalteparin Magan Salsalate  Anodynos Darvon compound Magnesium Salicylate Sine-off  Ansaid Dasin  Capsules Magsal Sodium Salicylate  Anturane Depen Capsules Marnal Soma  APF Arthritis pain formula Dewitt's Pills Measurin Stanback  Argesic Dia-Gesic Meclofenamic Sulfinpyrazone  Arthritis Bayer Timed Release Aspirin Diclofenac Meclomen Sulindac  Arthritis pain formula Anacin Dicumarol Medipren Supac  Analgesic (Safety coated) Arthralgen Diffunasal Mefanamic Suprofen  Arthritis Strength Bufferin Dihydrocodeine Mepro Compound Suprol  Arthropan liquid Dopirydamole Methcarbomol with Aspirin Synalgos  ASA tablets/Enseals Disalcid Micrainin Tagament  Ascriptin Doan's Midol Talwin  Ascriptin A/D Dolene Mobidin Tanderil  Ascriptin Extra Strength Dolobid Moblgesic Ticlid  Ascriptin with Codeine Doloprin or Doloprin with Codeine Momentum Tolectin  Asperbuf Duoprin Mono-gesic Trendar  Aspergum Duradyne Motrin or Motrin IB Triminicin  Aspirin plain, buffered or enteric coated Durasal Myochrisine Trigesic  Aspirin Suppositories Easprin Nalfon Trillsate  Aspirin with Codeine Ecotrin Regular or Extra Strength Naprosyn Uracel  Atromid-S Efficin Naproxen Ursinus  Auranofin Capsules Elmiron Neocylate Vanquish  Axotal Emagrin Norgesic Verin  Azathioprine Empirin or Empirin with Codeine Normiflo Vitamin E  Azolid Emprazil Nuprin Voltaren  Bayer Aspirin plain, buffered or children's or timed BC Tablets or powders Encaprin Orgaran Warfarin Sodium  Buff-a-Comp Enoxaparin Orudis Zorpin  Buff-a-Comp with Codeine Equegesic Os-Cal-Gesic   Buffaprin Excedrin plain, buffered or Extra Strength Oxalid   Bufferin Arthritis Strength Feldene Oxphenbutazone   Bufferin plain or Extra Strength Feldene Capsules Oxycodone with Aspirin   Bufferin with Codeine Fenoprofen Fenoprofen Pabalate or Pabalate-SF   Buffets II Flogesic Panagesic   Buffinol plain or Extra Strength Florinal or Florinal with Codeine Panwarfarin   Buf-Tabs Flurbiprofen Penicillamine   Butalbital Compound Four-way cold tablets Penicillin    Butazolidin Fragmin Pepto-Bismol   Carbenicillin Geminisyn Percodan   Carna Arthritis Reliever Geopen Persantine   Carprofen Gold's salt Persistin   Chloramphenicol Goody's Phenylbutazone   Chloromycetin Haltrain Piroxlcam   Clmetidine heparin Plaquenil   Cllnoril Hyco-pap Ponstel   Clofibrate Hydroxy chloroquine Propoxyphen         Before stopping any of these medications, be sure to consult the physician who ordered them.  Some, such as Coumadin (Warfarin) are ordered to prevent or treat serious conditions such as "deep thrombosis", "pumonary embolisms", and other heart problems.  The amount of time that you may need off of the medication may also vary with the medication and the reason for which you were taking it.  If you are taking any of these medications, please make sure you notify your pain physician before you undergo any procedure  is noted there is no visual cues from anyone other cranial nerves when the s.

## 2017-01-06 NOTE — Telephone Encounter (Signed)
Left voicemail for pt to return my call 

## 2017-01-06 NOTE — Telephone Encounter (Signed)
Noted! Thank you

## 2017-01-06 NOTE — Telephone Encounter (Signed)
Pt left v/m with update on pts neck issue with bulging disc; pt saw Dr Cherylann Ratel earlier today; pt is going to be getting a steroid injection epidural; pt will start PT 01/13/17.the pain is still there; no cb needed unless Dr Dayton Martes has further suggestion or instruction.

## 2017-01-06 NOTE — Progress Notes (Signed)
Patient's Name: Donna Vasquez  MRN: [id number]  Referring Provider: Marin Olp, PA-C  DOB: Jul 27, 1965  PCP: Jearld Fenton, NP  DOS: 01/06/2017  Note by: Gillis Santa, MD  Service setting: Ambulatory outpatient  Specialty: Interventional Pain Management  Location: [facility] (AMB) Pain Management Facility    Patient type: New patient ("FAST-TRACK" Evaluation)   Warning: This referral option does not include the extensive pharmacological evaluation required for [name] to take over the patient's medication management. The "Fast-Track" system is designed to bypass the new patient referral waiting list, as well as the normal patient evaluation process, in order to provide a patient in distress with a timely pain management intervention. Because the system was not designed to unfairly get a patient into our pain practice ahead of those already waiting, certain restrictions apply. By requesting a "Fast-Track" consult, the referring physician has opted to continue managing the patient's medications in order to get interventional urgent care.  Primary Reason for Visit: Interventional Pain Management Treatment. CC: Neck Pain (left)   Procedure  HPI  Donna Vasquez is a 51 y.o. year old, female patient, who comes today for a  "Fast-Track" new patient evaluation, as requested by Marin Olp, PA-C. The patient has been made aware that this type of referral option is reserved for the Interventional Pain Management portion of our practice and completely excludes the option of medication management. Her primarily concern today is the Neck Pain (left)  Pain Assessment: Location: Left Neck Radiating: entire left arm Onset: More than a month ago Duration: Chronic pain Quality: Sharp, Aching Severity: 5 /10 (self-reported pain score)  Note: Reported level is compatible with observation.                   Effect on ADL:   Timing: Constant Modifying factors: stretching  Onset and Duration: Sudden and Present less  than 3 months Cause of pain: Unknown Severity: Getting better, NAS-11 at its worse: 9/10, NAS-11 at its best: 3/10, NAS-11 now: 5/10 and NAS-11 on the average: 5/10 Timing: Morning, After activity or exercise and After a period of immobility Aggravating Factors: Bending, Kneeling and Prolonged sitting Alleviating Factors: Stretching, Cold packs, Hot packs and Standing Associated Problems: Inability to concentrate, Nausea, Sweating, Pain that wakes patient up and Pain that does not allow patient to sleep Quality of Pain: Aching, Annoying, Intermittent, Sharp, Shooting, Tender, Throbbing and Uncomfortable Previous Examinations or Tests: MRI scan and X-rays Previous Treatments: Steroid treatments by mouth  The patient comes into the clinics today, referred to [name] for a cervical epidural steroid injection.  Patient is a 51 year old female who presents with left neck and arm pain which is radicular in nature. Patient states that pain started on July 26 along her left side. She describes it as numbness, tingling, pain. Certain positions really aggravate her neck and left arm pain. This has impacted her work and her relationships with family. She states that lifting her left arm above her head helps out with her pain. She has not tried physical therapy.  Patient saw Dr. Cari Caraway with neurosurgery and was offered surgery but also recommended that she try injections and physical therapy beforehand.   Meds   Current Meds  Medication Sig  . albuterol (PROAIR HFA) 108 (90 Base) MCG/ACT inhaler Inhale 1-2 puffs into the lungs every 6 (six) hours as needed for wheezing or shortness of breath.  [name] aspirin 81 MG tablet Take 81 mg by mouth daily.  . cetirizine (ZYRTEC) 10 MG tablet Take  10 mg by mouth daily.  . Cholecalciferol (VITAMIN D-3) 1000 UNITS CAPS Take 2 capsules by mouth daily. 3000 units  . clonazePAM (KLONOPIN) 0.5 MG tablet TAKE 1 TABLET EVERY 6 HOURS AS NEEDED  . Fluticasone-Salmeterol  (ADVAIR) 250-50 MCG/DOSE AEPB Inhale 1 puff into the lungs daily.  . hyoscyamine (LEVSIN, ANASPAZ) 0.125 MG tablet Take 1 tablet (0.125 mg total) by mouth every 6 (six) hours as needed.  . montelukast (SINGULAIR) 10 MG tablet TAKE 1 TABLET (10 MG TOTAL) BY MOUTH AT BEDTIME.  . niacin (NIASPAN) 1000 MG CR tablet Take 1 tablet (1,000 mg total) by mouth at bedtime. MUST SCHEDULE ANNUAL EXAM  . omeprazole (PRILOSEC) 20 MG capsule Take 1 capsule (20 mg total) by mouth daily.  . sertraline (ZOLOFT) 100 MG tablet Take 1 tablet (100 mg total) by mouth daily. SCHEDULE ANNUAL PHYSICAL EXAM FOR July 2018  . simvastatin (ZOCOR) 20 MG tablet Take 1 tablet (20 mg total) by mouth daily at 6 PM. PLEASE SCHEDULE ANNUAL PHYSICAL    Imaging Review  Cervical Imaging: Cervical MR wo contrast:  Results for orders placed during the hospital encounter of 12/31/16  MR Cervical Spine Wo Contrast   Narrative CLINICAL DATA:  Left neck pain radiating into the left arm to the little finger of the left hand. No known injury.  EXAM: MRI CERVICAL SPINE WITHOUT CONTRAST  TECHNIQUE: Multiplanar, multisequence MR imaging of the cervical spine was performed. No intravenous contrast was administered.  COMPARISON:  Plain film cervical spine 12/14/2016.  FINDINGS: Alignment: Maintained.  Vertebrae: No fracture or worrisome lesion.  Cord: Normal signal throughout.  Posterior Fossa, vertebral arteries, paraspinal tissues: Negative.  Disc levels:  C2-3:  Negative.  C3-4:  Negative.  C4-5: Minimal disc bulge and uncovertebral disease without stenosis.  C5-6: Minimal disc bulge and uncovertebral disease without stenosis.  C6-7: The patient has a large disc protrusion on the left in the foraminal entry zone. The disc impinges on the left C7 root as it enters the foramen. The disc also mildly deflects the cord to the right. The right foramen is open.  C7-T1:  Negative.  IMPRESSION: Large left-sided disc  protrusion in the foraminal entry zone at C6-7 impinges on the left C7 root as it enters the foramen and slightly deflects the cord to the right.   Electronically Signed   By: Inge Rise M.D.   On: 12/31/2016 12:45     Cervical DG 2-3 views:  Results for orders placed during the hospital encounter of 12/14/16  DG Cervical Spine 2-3 Views   Narrative CLINICAL DATA:  Patient with neck pain. No reported injury. Initial encounter.  EXAM: CERVICAL SPINE - 2-3 VIEW  COMPARISON:  None.  FINDINGS: Visualization through C7 on lateral view. Straightening of the normal cervical lordosis. Grade 1 anterolisthesis of C3 on C4 and C4 on C5. Preservation of the vertebral body and intervertebral disc space heights. Prevertebral soft tissues are unremarkable. Lung apices are unremarkable. Lateral masses articulate appropriately with the dens.  IMPRESSION: Nonspecific grade 1 anterolisthesis of C3 on C4 and C4 on C5.   Electronically Signed   By: Lovey Newcomer M.D.   On: 12/14/2016 12:55      Thoracic DG 2-3 views:  Results for orders placed during the hospital encounter of 05/30/16  DG Thoracic Spine 2 View   Narrative CLINICAL DATA: Pain.  History of arthritis.  No reported injury.  EXAM: THORACIC SPINE 2 VIEWS  COMPARISON:  No recent prior.  FINDINGS: No  acute or focal bony abnormality identified. Minimal S shaped scoliosis noted. Diffuse mild degenerative change.  IMPRESSION: Minimal scoliosis. Diffuse mild degenerative change. No acute or focal abnormality .   Electronically Signed   By: Marcello Moores  Register   On: 05/30/2016 16:30     Lumbar DG 2-3 views:  Results for orders placed during the hospital encounter of 05/30/16  DG Lumbar Spine 2-3 Views   Narrative CLINICAL DATA:  Pain.  No reported injury.  EXAM: LUMBAR SPINE - 2-3 VIEW  COMPARISON:  None.  FINDINGS: No acute bony abnormality identified. Normal alignment mineralization. Surgical clips  right upper quadrant.  IMPRESSION: No acute or focal abnormality.   Electronically Signed   By: Marcello Moores  Register  n: 05/30/2016 16:27      Complexity Note: Imaging results reviewed. Results discussed using Layman's terms.               ROS  Cardiovascular History: Daily Aspirin intake Pulmonary or Respiratory History: Wheezing and difficulty taking a deep full breath (Asthma) and Snoring  Neurological History: No reported neurological signs or symptoms such as seizures, abnormal skin sensations, urinary and/or fecal incontinence, being born with an abnormal open spine and/or a tethered spinal cord Review of Past Neurological Studies: No results found for this or any previous visit. Psychological-Psychiatric History: Depressed Gastrointestinal History: Reflux or heatburn and Alternating episodes iof diarrhea and constipation (IBS-Irritable bowe syndrome) Genitourinary History: No reported renal or genitourinary signs or symptoms such as difficulty voiding or producing urine, peeing blood, non-functioning kidney, kidney stones, difficulty emptying the bladder, difficulty controlling the flow of urine, or chronic kidney disease Hematological History: No reported hematological signs or symptoms such as prolonged bleeding, low or poor functioning platelets, bruising or bleeding easily, hereditary bleeding problems, low energy levels due to low hemoglobin or being anemic Endocrine History: No reported endocrine signs or symptoms such as high or low blood sugar, rapid heart rate due to high thyroid levels, obesity or weight gain due to slow thyroid or thyroid disease Rheumatologic History: Rheumatoid arthritis Musculoskeletal History: Negative for myasthenia gravis, muscular dystrophy, multiple sclerosis or malignant hyperthermia Work History: Working full time  Allergies  Donna Vasquez is allergic to flonase [fluticasone propionate] and sulfa antibiotics.  Laboratory Chemistry  Inflammation  Markers (CRP: Acute Phase) (ESR: Chronic Phase) No results found for: CRP, ESRSEDRATE               Renal Function Markers Lab Results  Component Value Date   BUN 17 11/13/2015   CREATININE 0.91 11/13/2015                 Hepatic Function Markers Lab Results  Component Value Date   AST 17 11/13/2015   ALT 18 11/13/2015   ALBUMIN 4.3 11/13/2015   ALKPHOS 79 11/13/2015                 Electrolytes Lab Results  Component Value Date   NA 138 11/13/2015   K 4.4 11/13/2015   CL 103 11/13/2015   CALCIUM 9.5 11/13/2015                 Neuropathy Markers Lab Results  Component Value Date   VITAMINB12 431 05/26/2014                 Bone Pathology Markers Lab Results  Component Value Date   ALKPHOS 79 11/13/2015   VD25OH 22.63 (L) 05/26/2014   CALCIUM 9.5 11/13/2015  Coagulation Parameters Lab Results  Component Value Date   PLT 253.0 11/13/2015                 Cardiovascular Markers Lab Results  Component Value Date   HGB 13.7 11/13/2015   HCT 40.4 11/13/2015                 Note: Lab results reviewed.  [place]  Drug: Donna Vasquez  reports that she does not use drugs. Alcohol:  reports that she drinks alcohol. Tobacco:  reports that she has never smoked. She has never used smokeless tobacco. Medical:  has a past medical history of Allergy; Asthma; Chicken pox; Frequent headaches; Hyperlipidemia; IBS (irritable bowel syndrome); Interstitial cystitis; and Urine incontinence. Family: family history includes COPD in her mother; Cancer in her maternal aunt and paternal aunt; Emphysema in her mother; Heart disease in her father and mother; Hypertension in her father; Polymyalgia rheumatica in her mother; Stroke in her maternal grandmother and mother.  Past Surgical History:  Procedure Laterality Date  . CHOLECYSTECTOMY    . cyst removed from back    . NASAL SINUS SURGERY    . TUBAL LIGATION  1999   Active Ambulatory Problems    Diagnosis Date Noted   . IBS (irritable bowel syndrome) 05/26/2014  . Gastroesophageal reflux disease without esophagitis 05/26/2014  . Allergy-induced asthma 05/26/2014  . Insomnia 05/26/2014  . HLD (hyperlipidemia) 05/26/2014  . OSA (obstructive sleep apnea) 08/19/2014  . Low back pain 12/24/2010  . Neck pain, acute 12/12/2016  . Torticollis 12/16/2016  . Left arm pain 12/26/2016   Resolved Ambulatory Problems    Diagnosis Date Noted  . Acute pansinusitis 08/17/2015  . URI (upper respiratory infection) 07/06/2016   Past Medical History:  Diagnosis Date  . Allergy   . Asthma   . Chicken pox   . Frequent headaches   . Hyperlipidemia   . IBS (irritable bowel syndrome)   . Interstitial cystitis   . Urine incontinence    Constitutional Exam  General appearance: Well nourished, well developed, and well hydrated. In no apparent acute distress Vitals:   01/06/17 1212  BP: (!) 148/66  Pulse: 78  Resp: 16  Temp: 98.6 F (37 C)  TempSrc: Oral  SpO2: 98%  Weight: 240 lb (108.9 kg)  Height: 5' 7" (1.702 m)   BMI Assessment: Estimated body mass index is 37.59 kg/m as calculated from the following:   Height as of this encounter: 5' 7" (1.702 m).   Weight as of this encounter: 240 lb (108.9 kg).  BMI interpretation table: BMI level Category Range association with higher incidence of chronic pain  <18 kg/m2 Underweight   18.5-24.9 kg/m2 Ideal body weight   25-29.9 kg/m2 Overweight Increased incidence by 20%  30-34.9 kg/m2 Obese (Class I) Increased incidence by 68%  35-39.9 kg/m2 Severe obesity (Class II) Increased incidence by 136%  >40 kg/m2 Extreme obesity (Class III) Increased incidence by 254%   BMI Readings from Last 4 Encounters:  01/06/17 37.59 kg/m  01/01/17 37.59 kg/m  12/26/16 37.81 kg/m  12/16/16 37.57 kg/m   Wt Readings from Last 4 Encounters:  01/06/17 240 lb (108.9 kg)  01/01/17 240 lb (108.9 kg)  12/26/16 245 lb (111.1 kg)  12/16/16 243 lb 8 oz (110.5 kg)   Psych/Mental status: Alert, oriented x 3 (person, place, & time)       Eyes: PERLA Respiratory: No evidence of acute respiratory distress   Cervical spine: Tender to palpation overlying left  cervical facets and cervical paraspinal muscles. Limited cervical extension secondary to pain. 5 out of 5 strength left shoulder abduction, elbow flexion, elbow extension, thumb extension.   Lumbar Spine Area Exam  Skin & Axial Inspection: No masses, redness, or swelling Alignment: Symmetrical Functional ROM: Unrestricted ROM      Stability: No instability detected Muscle Tone/Strength: Functionally intact. No obvious neuro-muscular anomalies detected. Sensory (Neurological): Unimpaired Palpation: No palpable anomalies       Provocative Tests: Lumbar Hyperextension and rotation test: evaluation deferred today       Lumbar Lateral bending test: evaluation deferred today       Patrick's Maneuver: evaluation deferred today                    Gait & Posture Assessment  Ambulation: Unassisted Gait: Relatively normal for age and body habitus Posture: WNL   Lower Extremity Exam    Side: Right lower extremity  Side: Left lower extremity  Skin & Extremity Inspection: Skin color, temperature, and hair growth are WNL. No peripheral edema or cyanosis. No masses, redness, swelling, asymmetry, or associated skin lesions. No contractures.  Skin & Extremity Inspection: Skin color, temperature, and hair growth are WNL. No peripheral edema or cyanosis. No masses, redness, swelling, asymmetry, or associated skin lesions. No contractures.  Functional ROM: Unrestricted ROM          Functional ROM: Unrestricted ROM          Muscle Tone/Strength: Functionally intact. No obvious neuro-muscular anomalies detected.  Muscle Tone/Strength: Functionally intact. No obvious neuro-muscular anomalies detected.  Sensory (Neurological): Unimpaired  Sensory (Neurological): Unimpaired  Palpation: No palpable anomalies  Palpation: No  palpable anomalies    Procedure  1. Cervical radiculopathy   2. Neck pain   3. Myofascial pain syndrome, cervical    Orders Placed This Encounter  Procedures  . Cervical Epidural Injection    Standing Status:   Future    Standing Expiration Date:   02/06/2017    Scheduling Instructions:     Side: Left-sided     Level: C7-T1     Sedation: Patient's choice.     Timeframe: ASAP- either Wednesday or next week    Order Specific Question:   Where will this procedure be performed?    Answer:   [facility]    Comments:   by Dr. Dossie Arbour    51 year old female with neck and left arm pain, that started after sleeping in an unusual position, with cervical MRI showing Large left-sided disc protrusion in the foraminal entry zone at C6-7 impinges on the left C7 root as it enters the foramen and slightly deflects the cord to the right. Patient is scheduled for physical therapy on August 27. We discussed the potential risks and perceived benefits of a cervical epidural steroid injection and patient would like to proceed. I've instructed the patient to stop her aspirin 81 mg 5 days prior to her scheduled procedure.  Plan: #1. Cervical epidural steroid injection at C7/T1 under fluoroscopy. #2. Continue physical therapy.

## 2017-01-08 ENCOUNTER — Encounter: Payer: Self-pay | Admitting: Student in an Organized Health Care Education/Training Program

## 2017-01-08 ENCOUNTER — Ambulatory Visit (HOSPITAL_BASED_OUTPATIENT_CLINIC_OR_DEPARTMENT_OTHER): Payer: 59 | Admitting: Student in an Organized Health Care Education/Training Program

## 2017-01-08 ENCOUNTER — Ambulatory Visit
Admission: RE | Admit: 2017-01-08 | Discharge: 2017-01-08 | Disposition: A | Discharge status: Home / Self Care | Payer: 59 | Source: Ambulatory Visit | Attending: Student in an Organized Health Care Education/Training Program | Admitting: Student in an Organized Health Care Education/Training Program

## 2017-01-08 VITALS — BP 127/68 | HR 73 | Temp 97.5°F | Resp 13 | Ht 67.5 in | Wt 240.0 lb

## 2017-01-08 DIAGNOSIS — M79602 Pain in left arm: Secondary | ICD-10-CM | POA: Diagnosis not present

## 2017-01-08 DIAGNOSIS — M5412 Radiculopathy, cervical region: Secondary | ICD-10-CM | POA: Diagnosis not present

## 2017-01-08 DIAGNOSIS — Z9049 Acquired absence of other specified parts of digestive tract: Secondary | ICD-10-CM | POA: Insufficient documentation

## 2017-01-08 DIAGNOSIS — M25512 Pain in left shoulder: Secondary | ICD-10-CM | POA: Diagnosis not present

## 2017-01-08 MED ORDER — SODIUM CHLORIDE 0.9% FLUSH
1.0000 mL | Freq: Once | INTRAVENOUS | Status: AC
  Administered 2017-01-08: 10 mL

## 2017-01-08 MED ORDER — LIDOCAINE HCL (PF) 1 % IJ SOLN
5.0000 mL | Freq: Once | INTRAMUSCULAR | Status: AC
  Administered 2017-01-08: 5 mL
  Filled 2017-01-08: qty 5

## 2017-01-08 MED ORDER — IOPAMIDOL (ISOVUE-M 200) INJECTION 41%
10.0000 mL | Freq: Once | INTRAMUSCULAR | Status: AC
  Administered 2017-01-08: 10 mL via EPIDURAL
  Filled 2017-01-08: qty 10

## 2017-01-08 MED ORDER — FENTANYL CITRATE (PF) 100 MCG/2ML IJ SOLN
INTRAMUSCULAR | Status: AC
  Filled 2017-01-08: qty 2

## 2017-01-08 MED ORDER — ROPIVACAINE HCL 2 MG/ML IJ SOLN
1.0000 mL | Freq: Once | INTRAMUSCULAR | Status: AC
  Administered 2017-01-08: 10 mL via EPIDURAL
  Filled 2017-01-08: qty 10

## 2017-01-08 MED ORDER — FENTANYL CITRATE (PF) 100 MCG/2ML IJ SOLN
25.0000 ug | INTRAMUSCULAR | Status: DC | PRN
Start: 2017-01-08 — End: 2017-01-08
  Administered 2017-01-08: 50 ug via INTRAVENOUS

## 2017-01-08 MED ORDER — DEXAMETHASONE SODIUM PHOSPHATE 10 MG/ML IJ SOLN
10.0000 mg | Freq: Once | INTRAMUSCULAR | Status: AC
  Administered 2017-01-08: 10 mg
  Filled 2017-01-08: qty 1

## 2017-01-08 NOTE — Patient Instructions (Signed)
Epidural Steroid Injection An epidural steroid injection is a shot of steroid medicine and numbing medicine that is given into the space between the spinal cord and the bones in your back (epidural space). The shot helps relieve pain caused by an irritated or swollen nerve root. The amount of pain relief you get from the injection depends on what is causing the nerve to be swollen and irritated, and how long your pain lasts. You are more likely to benefit from this injection if your pain is strong and comes on suddenly rather than if you have had pain for a long time. Tell a health care provider about:  Any allergies you have.  All medicines you are taking, including vitamins, herbs, eye drops, creams, and over-the-counter medicines.  Any problems you or family members have had with anesthetic medicines.  Any blood disorders you have.  Any surgeries you have had.  Any medical conditions you have.  Whether you are pregnant or may be pregnant. What are the risks? Generally, this is a safe procedure. However, problems may occur, including:  Headache.  Bleeding.  Infection.  Allergic reaction to medicines.  Damage to your nerves.  What happens before the procedure? Staying hydrated Follow instructions from your health care provider about hydration, which may include:  Up to 2 hours before the procedure - you may continue to drink clear liquids, such as water, clear fruit juice, black coffee, and plain tea.  Eating and drinking restrictions Follow instructions from your health care provider about eating and drinking, which may include:  8 hours before the procedure - stop eating heavy meals or foods such as meat, fried foods, or fatty foods.  6 hours before the procedure - stop eating light meals or foods, such as toast or cereal.  6 hours before the procedure - stop drinking milk or drinks that contain milk.  2 hours before the procedure - stop drinking clear  liquids.  Medicine  You may be given medicines to lower anxiety.  Ask your health care provider about: ? Changing or stopping your regular medicines. This is especially important if you are taking diabetes medicines or blood thinners. ? Taking medicines such as aspirin and ibuprofen. These medicines can thin your blood. Do not take these medicines before your procedure if your health care provider instructs you not to. General instructions  Plan to have someone take you home from the hospital or clinic. What happens during the procedure?  You may receive a medicine to help you relax (sedative).  You will be asked to lie on your abdomen.  The injection site will be cleaned.  A numbing medicine (local anesthetic) will be used to numb the injection site.  A needle will be inserted through your skin into the epidural space. You may feel some discomfort when this happens. An X-ray machine will be used to make sure the needle is put as close as possible to the affected nerve.  A steroid medicine and a local anesthetic will be injected into the epidural space.  The needle will be removed.  A bandage (dressing) will be put over the injection site. What happens after the procedure?  Your blood pressure, heart rate, breathing rate, and blood oxygen level will be monitored until the medicines you were given have worn off.  Your arm or leg may feel weak or numb for a few hours.  The injection site may feel sore.  Do not drive for 24 hours if you received a sedative. This   information is not intended to replace advice given to you by your health care provider. Make sure you discuss any questions you have with your health care provider. Document Released: 08/13/2007 Document Revised: 10/18/2015 Document Reviewed: 08/22/2015 [employer]vier Interactive Patient Education  2017 [employer] Pain Management Discharge Instructions  General Discharge Instructions :  If you need to reach your  doctor call: Monday-Friday 8:00 am - 4:00 pm at [phone] or toll free [phone].  After clinic hours [phone] to have operator reach doctor.  Bring all of your medication bottles to all your appointments in the pain clinic.  To cancel or reschedule your appointment with Pain Management please remember to call 24 hours in advance to avoid a fee.  Refer to the educational materials which you have been given on: General Risks, I had my Procedure. Discharge Instructions, Post Sedation.  Post Procedure Instructions:  The drugs you were given will stay in your system until tomorrow, so for the next 24 hours you should not drive, make any legal decisions or drink any alcoholic beverages.  You may eat anything you prefer, but it is better to start with liquids then soups and crackers, and gradually work up to solid foods.  Please notify your doctor immediately if you have any unusual bleeding, trouble breathing or pain that is not related to your normal pain.  Depending on the type of procedure that was done, some parts of your body may feel week and/or numb.  This usually clears up by tonight or the next day.  Walk with the use of an assistive device or accompanied by an adult for the 24 hours.  You may use ice on the affected area for the first 24 hours.  Put ice in a Ziploc bag and cover with a towel and place against area 15 minutes on 15 minutes off.  You may switch to heat after 24 hours. 

## 2017-01-08 NOTE — Addendum Note (Signed)
Addended by: Edward Jolly on: 01/08/2017 12:48 PM   Modules accepted: Orders

## 2017-01-08 NOTE — Progress Notes (Signed)
Safety precautions to be maintained throughout the outpatient stay will include: orient to surroundings, keep bed in low position, maintain call bell within reach at all times, provide assistance with transfer out of bed and ambulation.  

## 2017-01-08 NOTE — Progress Notes (Signed)
Patient's Name: Donna Vasquez  MRN: [id number]  Referring Provider: Edward Jolly, MD  DOB: February 19, 1966  PCP: Lorre Munroe, NP  DOS: 01/08/2017  Note by: Edward Jolly, MD  Service setting: Ambulatory outpatient  Specialty: Interventional Pain Management  Patient type: Established  Location: [facility] (AMB) Pain Management Facility  Visit type: Interventional Procedure   Primary Reason for Visit: Interventional Pain Management Treatment. CC: Arm Pain (left); Shoulder Pain (left); and Neck Pain (left cervical)  Procedure:  Anesthesia, Analgesia, Anxiolysis:  Type: Diagnostic and therapeutic, Inter-Laminar, Epidural Steroid Injection #1 Region: Posterior Cervico-thoracic Region Level: C7-T1 Laterality: Left-Sided Paramedial  Type: Local Anesthesia with Moderate (Conscious) Sedation Local Anesthetic: Lidocaine 1% Route: Intravenous (IV) IV Access: Secured Sedation: Meaningful verbal contact was maintained at all times during the procedure  Indication(s): Analgesia and Anxiety  Indications: 1. Cervical radiculopathy    51 year old female with neck and left arm pain, that started after sleeping in an unusual position, with cervical MRI showing Large left-sided disc protrusion in the foraminal entry zone at C6-7 impinges on the left C7 root as it enters the foramen and slightly deflects the cord to the right. Patient is scheduled for physical therapy on August 27. We discussed the potential risks and perceived benefits of a cervical epidural steroid injection and patient would like to proceed.  Pain Score: Pre-procedure: 7 /10 Post-procedure: 1 /10  Pre-op Assessment:  Donna Vasquez is a 51 y.o. (year old), female patient, seen today for interventional treatment. She  has a past surgical history that includes Cholecystectomy; Tubal ligation (1999); Nasal sinus surgery; and cyst removed from back. Donna Vasquez has a current medication list which includes the following prescription(s): albuterol,  aspirin, cetirizine, vitamin d-3, clonazepam, cyclobenzaprine, fluticasone-salmeterol, hyoscyamine, meloxicam, methocarbamol, mometasone, montelukast, niacin, omeprazole, oxycodone-acetaminophen, prednisone, sertraline, simvastatin, and triamcinolone cream. Her primarily concern today is the Arm Pain (left); Shoulder Pain (left); and Neck Pain (left cervical)  Initial Vital Signs: Last menstrual period 12/11/2016. BMI: Estimated body mass index is 37.03 kg/m as calculated from the following:   Height as of this encounter: 5' 7.5" (1.715 m).   Weight as of this encounter: 240 lb (108.9 kg).  Risk Assessment: Allergies: Reviewed. She is allergic to flonase [fluticasone propionate] and sulfa antibiotics.  Allergy Precautions: None required Coagulopathies: Reviewed. None identified.  Blood-thinner therapy: None at this time Active Infection(s): Reviewed. None identified. Donna Vasquez is afebrile  Site Confirmation: Donna Vasquez was asked to confirm the procedure and laterality before marking the site Procedure checklist: Completed Consent: Before the procedure and under the influence of no sedative(s), amnesic(s), or anxiolytics, the patient was informed of the treatment options, risks and possible complications. To fulfill our ethical and legal obligations, as recommended by the American Medical Association's Code of Ethics, I have informed the patient of my clinical impression; the nature and purpose of the treatment or procedure; the risks, benefits, and possible complications of the intervention; the alternatives, including doing nothing; the risk(s) and benefit(s) of the alternative treatment(s) or procedure(s); and the risk(s) and benefit(s) of doing nothing. The patient was provided information about the general risks and possible complications associated with the procedure. These may include, but are not limited to: failure to achieve desired goals, infection, bleeding, organ or nerve damage,  allergic reactions, paralysis, and death. In addition, the patient was informed of those risks and complications associated to Spine-related procedures, such as failure to decrease pain; infection (i.e.: Meningitis, epidural or intraspinal abscess); bleeding (i.e.: epidural hematoma, subarachnoid hemorrhage, or  any other type of intraspinal or peri-dural bleeding); organ or nerve damage (i.e.: Any type of peripheral nerve, nerve root, or spinal cord injury) with subsequent damage to sensory, motor, and/or autonomic systems, resulting in permanent pain, numbness, and/or weakness of one or several areas of the body; allergic reactions; (i.e.: anaphylactic reaction); and/or death. Furthermore, the patient was informed of those risks and complications associated with the medications. These include, but are not limited to: allergic reactions (i.e.: anaphylactic or anaphylactoid reaction(s)); adrenal axis suppression; blood sugar elevation that in diabetics may result in ketoacidosis or comma; water retention that in patients with history of congestive heart failure may result in shortness of breath, pulmonary edema, and decompensation with resultant heart failure; weight gain; swelling or edema; medication-induced neural toxicity; particulate matter embolism and blood vessel occlusion with resultant organ, and/or nervous system infarction; and/or aseptic necrosis of one or more joints. Finally, the patient was informed that Medicine is not an exact science; therefore, there is also the possibility of unforeseen or unpredictable risks and/or possible complications that may result in a catastrophic outcome. The patient indicated having understood very clearly. We have given the patient no guarantees and we have made no promises. Enough time was given to the patient to ask questions, all of which were answered to the patient's satisfaction. Donna Vasquez has indicated that she wanted to continue with the  procedure. Attestation: I, the ordering provider, attest that I have discussed with the patient the benefits, risks, side-effects, alternatives, likelihood of achieving goals, and potential problems during recovery for the procedure that I have provided informed consent. Date: 01/08/2017; Time: 9:01 AM  Pre-Procedure Preparation:  Monitoring: As per clinic protocol. Respiration, ETCO2, SpO2, BP, heart rate and rhythm monitor placed and checked for adequate function Safety Precautions: Patient was assessed for positional comfort and pressure points before starting the procedure. Time-out: I initiated and conducted the "Time-out" before starting the procedure, as per protocol. The patient was asked to participate by confirming the accuracy of the "Time Out" information. Verification of the correct person, site, and procedure were performed and confirmed by me, the nursing staff, and the patient. "Time-out" conducted as per Joint Commission's Universal Protocol (UP.01.01.01). "Time-out" Date & Time: 01/08/2017; 0954 hrs.  Description of Procedure Process:   Position: Prone with head of the table was raised to facilitate breathing. Target Area: For Epidural Steroid injections the target is the interlaminar space, initially targeting the lower border of the superior vertebral body lamina. Approach: Paramedial approach. Area Prepped: Entire PosteriorCervical Region Prepping solution: ChloraPrep (2% chlorhexidine gluconate and 70% isopropyl alcohol) Safety Precautions: Aspiration looking for blood return was conducted prior to all injections. At no point did we inject any substances, as a needle was being advanced. No attempts were made at seeking any paresthesias. Safe injection practices and needle disposal techniques used. Medications properly checked for expiration dates. SDV (single dose vial) medications used. Description of the Procedure: Protocol guidelines were followed. The procedure needle was  introduced through the skin, ipsilateral to the reported pain, and advanced to the target area. Bone was contacted and the needle walked caudad, until the lamina was cleared. The epidural space was identified using "loss-of-resistance technique" with 2-3 ml of PF-NaCl (0.9% NSS), in a 5cc LOR glass syringe. Vitals:   01/08/17 1015 01/08/17 1025 01/08/17 1035 01/08/17 1045  BP: (!) 115/96 138/72 127/71 127/68  Pulse:  71 75 73  Resp: 15 14 16 13   Temp:    (!) 97.5 F (36.4 C)  TempSrc:    Tympanic  SpO2: 97% 98% 97% 99%  Weight:      Height:        Start Time: 0955 hrs. End Time: 1014 hrs. Materials:  Needle(s) Type: Epidural needle Gauge: 17G Length: 3.5-in Medication(s): We administered iopamidol, dexamethasone, sodium chloride flush, lidocaine (PF), and ropivacaine (PF) 2 mg/mL (0.2%). Please see chart orders for dosing details. 1 cc of Decadron, 2 cc of preservative-free saline, half a cc of 0.2% ropivacaine. Total volume 3.5 cc Imaging Guidance (Spinal):  Type of Imaging Technique: Fluoroscopy Guidance (Spinal) Indication(s): Assistance in needle guidance and placement for procedures requiring needle placement in or near specific anatomical locations not easily accessible without such assistance. Exposure Time: Please see nurses notes. Contrast: Before injecting any contrast, we confirmed that the patient did not have an allergy to iodine, shellfish, or radiological contrast. Once satisfactory needle placement was completed at the desired level, radiological contrast was injected. Contrast injected under live fluoroscopy. No contrast complications. See chart for type and volume of contrast used. Fluoroscopic Guidance: I was personally present during the use of fluoroscopy. "Tunnel Vision Technique" used to obtain the best possible view of the target area. Parallax error corrected before commencing the procedure. "Direction-depth-direction" technique used to introduce the needle under  continuous pulsed fluoroscopy. Once target was reached, antero-posterior, oblique, and lateral fluoroscopic projection used confirm needle placement in all planes. Images permanently stored in EMR. Interpretation: I personally interpreted the imaging intraoperatively. Adequate needle placement confirmed in multiple planes. Appropriate spread of contrast into desired area was observed. No evidence of afferent or efferent intravascular uptake. No intrathecal or subarachnoid spread observed. Permanent images saved into the patient's record.  Antibiotic Prophylaxis:  Indication(s): None identified Antibiotic given: None  Post-operative Assessment:  EBL: None Complications: No immediate post-treatment complications observed by team, or reported by patient. Note: The patient tolerated the entire procedure well. A repeat set of vitals were taken after the procedure and the patient was kept under observation following institutional policy, for this type of procedure. Post-procedural neurological assessment was performed, showing return to baseline, prior to discharge. The patient was provided with post-procedure discharge instructions, including a section on how to identify potential problems. Should any problems arise concerning this procedure, the patient was given instructions to immediately contact us, at any time, without hesitation. In any case, we plan to contact the patient by telephone for a follow-up status report regarding this interventional procedure. Comments:  No additional relevant information. 5 out of 5 strength bilateral upper extremity: Left hand grip, left elbow flexion, elbow extension, shoulder abduction testing, C5 C6 C7 C8  Plan of Care  Disposition: Discharge home  Discharge Date & Time: 01/08/2017; 1047 hrs.  Physician-requested Follow-up:  Return for Post Procedure Evaluation.  Future Appointments Date Time Provider Department Center  01/23/2017 12:15 PM Edward Jolly, MD  [facility] None    Imaging Orders     DG C-Arm 1-60 Min-No Report  Procedure Orders     Cervical Epidural Injection  Medications ordered for procedure: Meds ordered this encounter  Medications  . iopamidol (ISOVUE-M) 41 % intrathecal injection 10 mL  . dexamethasone (DECADRON) injection 10 mg  . sodium chloride flush (NS) 0.9 % injection 1 mL  . lidocaine (PF) (XYLOCAINE) 1 % injection 5 mL  . ropivacaine (PF) 2 mg/mL (0.2%) (NAROPIN) injection 1 mL   Medications administered: We administered iopamidol, dexamethasone, sodium chloride flush, lidocaine (PF), and ropivacaine (PF) 2 mg/mL (0.2%).  See the medical record  for exact dosing, route, and time of administration.  New Prescriptions   No medications on file   Primary Care Physician: Lorre Munroe, NP Location: [facility] Note by: Edward Jolly, MD Date: 01/08/2017; Time: 12:27 PM  Disclaimer:  Medicine is not an exact science. The only guarantee in medicine is that nothing is guaranteed. It is important to note that the decision to proceed with this intervention was based on the information collected from the patient. The Data and conclusions were drawn from the patient's questionnaire, the interview, and the physical examination. Because the information was provided in large part by the patient, it cannot be guaranteed that it has not been purposely or unconsciously manipulated. Every effort has been made to obtain as much relevant data as possible for this evaluation. It is important to note that the conclusions that lead to this procedure are derived in large part from the available data. Always take into account that the treatment will also be dependent on availability of resources and existing treatment guidelines, considered by other Pain Management Practitioners as being common knowledge and practice, at the time of the intervention. For Medico-Legal purposes, it is also important to point out  that variation in procedural techniques and pharmacological choices are the acceptable norm. The indications, contraindications, technique, and results of the above procedure should only be interpreted and judged by a Board-Certified Interventional Pain Specialist with extensive familiarity and expertise in the same exact procedure and technique.

## 2017-01-09 ENCOUNTER — Other Ambulatory Visit: Payer: Self-pay | Admitting: Internal Medicine

## 2017-01-09 ENCOUNTER — Other Ambulatory Visit: Payer: Self-pay

## 2017-01-23 ENCOUNTER — Ambulatory Visit
Payer: 59 | Attending: Student in an Organized Health Care Education/Training Program | Admitting: Student in an Organized Health Care Education/Training Program

## 2017-01-23 ENCOUNTER — Encounter: Payer: Self-pay | Admitting: Student in an Organized Health Care Education/Training Program

## 2017-01-23 VITALS — BP 138/79 | HR 77 | Temp 98.0°F | Resp 16 | Ht 67.5 in | Wt 240.0 lb

## 2017-01-23 DIAGNOSIS — J45909 Unspecified asthma, uncomplicated: Secondary | ICD-10-CM | POA: Insufficient documentation

## 2017-01-23 DIAGNOSIS — M791 Myalgia: Secondary | ICD-10-CM | POA: Diagnosis not present

## 2017-01-23 DIAGNOSIS — K219 Gastro-esophageal reflux disease without esophagitis: Secondary | ICD-10-CM | POA: Insufficient documentation

## 2017-01-23 DIAGNOSIS — M545 Low back pain: Secondary | ICD-10-CM | POA: Insufficient documentation

## 2017-01-23 DIAGNOSIS — G4733 Obstructive sleep apnea (adult) (pediatric): Secondary | ICD-10-CM | POA: Diagnosis not present

## 2017-01-23 DIAGNOSIS — M542 Cervicalgia: Secondary | ICD-10-CM | POA: Insufficient documentation

## 2017-01-23 DIAGNOSIS — R32 Unspecified urinary incontinence: Secondary | ICD-10-CM | POA: Diagnosis not present

## 2017-01-23 DIAGNOSIS — G8929 Other chronic pain: Secondary | ICD-10-CM | POA: Insufficient documentation

## 2017-01-23 DIAGNOSIS — M4722 Other spondylosis with radiculopathy, cervical region: Secondary | ICD-10-CM | POA: Insufficient documentation

## 2017-01-23 DIAGNOSIS — M79602 Pain in left arm: Secondary | ICD-10-CM | POA: Diagnosis not present

## 2017-01-23 DIAGNOSIS — M5412 Radiculopathy, cervical region: Secondary | ICD-10-CM | POA: Diagnosis not present

## 2017-01-23 DIAGNOSIS — E785 Hyperlipidemia, unspecified: Secondary | ICD-10-CM | POA: Diagnosis not present

## 2017-01-23 DIAGNOSIS — M7918 Myalgia, other site: Secondary | ICD-10-CM

## 2017-01-23 DIAGNOSIS — K589 Irritable bowel syndrome without diarrhea: Secondary | ICD-10-CM | POA: Insufficient documentation

## 2017-01-23 DIAGNOSIS — M436 Torticollis: Secondary | ICD-10-CM | POA: Diagnosis not present

## 2017-01-23 NOTE — Progress Notes (Signed)
Safety precautions to be maintained throughout the outpatient stay will include: orient to surroundings, keep bed in low position, maintain call bell within reach at all times, provide assistance with transfer out of bed and ambulation.  

## 2017-01-23 NOTE — Progress Notes (Signed)
Patient's Name: Donna Vasquez  MRN: [id number]  Referring Provider: Jearld Fenton, NP  DOB: 18-Jul-1965  PCP: Jearld Fenton, NP  DOS: 01/23/2017  Note by: Gillis Santa, MD  Service setting: Ambulatory outpatient  Specialty: Interventional Pain Management  Location: [facility] (AMB) Pain Management Facility    Patient type: Established   Primary Reason(s) for Visit: Encounter for post-procedure evaluation of chronic illness with mild to moderate exacerbation CC: Neck Pain (cerivcal)  HPI  Donna Vasquez is a 51 y.o. year old, female patient, who comes today for a post-procedure evaluation. She has IBS (irritable bowel syndrome); Gastroesophageal reflux disease without esophagitis; Allergy-induced asthma; Insomnia; HLD (hyperlipidemia); OSA (obstructive sleep apnea); Low back pain; Neck pain, acute; Torticollis; and Left arm pain on her problem list. Her primarily concern today is the Neck Pain (cerivcal)  Pain Assessment: Location: Left Neck Radiating: shoulder and different places in arm Onset: More than a month ago Duration: Chronic pain Quality: Aching, Sharp Severity:  /10 (self-reported pain score)  Note: Reported level is compatible with observation.                   Effect on ADL: drive with left arm, using left arm on phone Timing: Intermittent Modifying factors: PT is helping, sleeping has improved, streching, good posture  Donna Vasquez comes in today for post-procedure evaluation after the treatment done on 01/08/2017.  51 year old female with neck and left arm pain, that started after sleeping in an unusual position, with cervical MRI showing Large left-sided disc protrusion in the foraminal entry zone at C6-7 impinges on the left C7 root as it enters the foramen and slightly deflects the cord to the right.  Patient is status post C7-T1, left-sided, cervical epidural steroid injection on 01/08/2017. Patient states that this has helped with her neck pain and her left upper extremity  pain. She states that she has "spotty" areas that are still occasionally painful and numb but not to the extent that it was before her cervical epidural. She states that the pain does not shoot down her left arm as much since her epidural. Patient continues to endorse axial neck pain that is most pronounced overlying her cervical facets, left shoulder, left trapezius region. She does have pain with cervical extension. The pain does not extend beyond her shoulder region. She does have pain overlying her cervical facets on the left.  Patient has been participating in physical therapy which she states has been helpful. I've encouraged her to continue.  Further details on both, my assessment(s), as well as the proposed treatment plan, please see below.  Post-Procedure Assessment  01/08/2017 Procedure: C7-T1, left-sided, cervical epidural steroid injection on 01/08/2017 Pre-procedure pain score:  7/10 Post-procedure pain score: 1/10         Influential Factors: BMI: 37.03 kg/m Intra-procedural challenges: None observed.         Assessment challenges: None detected.              Reported side-effects: None.        Post-procedural adverse reactions or complications: None reported         Sedation: Please see nurses note. When no sedatives are used, the analgesic levels obtained are directly associated to the effectiveness of the local anesthetics. However, when sedation is provided, the level of analgesia obtained during the initial 1 hour following the intervention, is believed to be the result of a combination of factors. These factors may include, but are not limited to: 1. The  effectiveness of the local anesthetics used. 2. The effects of the analgesic(s) and/or anxiolytic(s) used. 3. The degree of discomfort experienced by the patient at the time of the procedure. 4. The patients ability and reliability in recalling and recording the events. 5. The presence and influence of possible secondary  gains and/or psychosocial factors. Reported result: Relief experienced during the 1st hour after the procedure: 100 % (Ultra-Short Term Relief)            Interpretative annotation: Clinically appropriate result. Analgesia during this period is likely to be Local Anesthetic and/or IV Sedative (Analgesic/Anxiolytic) related.          Effects of local anesthetic: The analgesic effects attained during this period are directly associated to the localized infiltration of local anesthetics and therefore cary significant diagnostic value as to the etiological location, or anatomical origin, of the pain. Expected duration of relief is directly dependent on the pharmacodynamics of the local anesthetic used. Long-acting (4-6 hours) anesthetics used.  Reported result: Relief during the next 4 to 6 hour after the procedure: 90 % (Short-Term Relief)            Interpretative annotation: Clinically appropriate result. Analgesia during this period is likely to be Local Anesthetic-related.          Long-term benefit: Defined as the period of time past the expected duration of local anesthetics (1 hour for short-acting and 4-6 hours for long-acting). With the possible exception of prolonged sympathetic blockade from the local anesthetics, benefits during this period are typically attributed to, or associated with, other factors such as analgesic sensory neuropraxia, antiinflammatory effects, or beneficial biochemical changes provided by agents other than the local anesthetics.  Reported result: Extended relief following procedure: 60 % (3-4 days) (Long-Term Relief)            Interpretative annotation: Clinically appropriate result. Good relief. No permanent benefit expected. Inflammation plays a part in the etiology to the pain.          Current benefits: Defined as persistent relief that continues at this point in time.   Reported results: Treated area: >50 %       Interpretative annotation: Recurrence of symptoms.  No permanent benefit expected. Effective diagnostic intervention.          Interpretation: Results would suggest a successful diagnostic intervention.                  Plan:  Please see "Plan of Care" for details.  Laboratory Chemistry  Inflammation Markers (CRP: Acute Phase) (ESR: Chronic Phase) No results found for: CRP, ESRSEDRATE               Renal Function Markers Lab Results  Component Value Date   BUN 17 11/13/2015   CREATININE 0.91 11/13/2015                 Hepatic Function Markers Lab Results  Component Value Date   AST 17 11/13/2015   ALT 18 11/13/2015   ALBUMIN 4.3 11/13/2015   ALKPHOS 79 11/13/2015                 Electrolytes Lab Results  Component Value Date   NA 138 11/13/2015   K 4.4 11/13/2015   CL 103 11/13/2015   CALCIUM 9.5 11/13/2015                 Neuropathy Markers Lab Results  Component Value Date   VITAMINB12 431 05/26/2014  Bone Pathology Markers Lab Results  Component Value Date   ALKPHOS 79 11/13/2015   VD25OH 22.63 (L) 05/26/2014   CALCIUM 9.5 11/13/2015                 Coagulation Parameters Lab Results  Component Value Date   PLT 253.0 11/13/2015                 Cardiovascular Markers Lab Results  Component Value Date   HGB 13.7 11/13/2015   HCT 40.4 11/13/2015                 Note: Lab results reviewed.  Recent Diagnostic Imaging Review  Dg C-arm 1-60 Min-no Report  Result Date: 01/08/2017 Fluoroscopy was utilized by the requesting physician.  No radiographic interpretation.   Note: Imaging results reviewed.          Meds   Current Outpatient Prescriptions:  .  albuterol (PROAIR HFA) 108 (90 Base) MCG/ACT inhaler, Inhale 1-2 puffs into the lungs every 6 (six) hours as needed for wheezing or shortness of breath., Disp: 1 Inhaler, Rfl: 2 .  aspirin 81 MG tablet, Take 81 mg by mouth daily., Disp: , Rfl:  .  cetirizine (ZYRTEC) 10 MG tablet, Take 10 mg by mouth daily., Disp: , Rfl: 0 .   Cholecalciferol (VITAMIN D-3) 1000 UNITS CAPS, Take 2 capsules by mouth daily. 3000 units, Disp: , Rfl:  .  clonazePAM (KLONOPIN) 0.5 MG tablet, TAKE 1 TABLET EVERY 6 HOURS AS NEEDED, Disp: 30 tablet, Rfl: 0 .  Fluticasone-Salmeterol (ADVAIR) 250-50 MCG/DOSE AEPB, Inhale 1 puff into the lungs daily., Disp: 60 each, Rfl: 2 .  Homeopathic Products (ZICAM COLD REMEDY NA), Place 4 sprays into the nose., Disp: , Rfl:  .  hyoscyamine (LEVSIN, ANASPAZ) 0.125 MG tablet, Take 1 tablet (0.125 mg total) by mouth every 6 (six) hours as needed., Disp: 30 tablet, Rfl: 2 .  meloxicam (MOBIC) 7.5 MG tablet, Take 1 tablet (7.5 mg total) by mouth daily as needed for pain., Disp: 30 tablet, Rfl: 1 .  mometasone (NASONEX) 50 MCG/ACT nasal spray, Place 2 sprays into the nose daily., Disp: 17 g, Rfl: 1 .  montelukast (SINGULAIR) 10 MG tablet, TAKE 1 TABLET (10 MG TOTAL) BY MOUTH AT BEDTIME., Disp: 30 tablet, Rfl: 2 .  niacin (NIASPAN) 1000 MG CR tablet, Take 1 tablet (1,000 mg total) by mouth at bedtime. MUST SCHEDULE ANNUAL EXAM, Disp: 30 tablet, Rfl: 1 .  omeprazole (PRILOSEC) 20 MG capsule, Take 1 capsule (20 mg total) by mouth daily., Disp: 30 capsule, Rfl: 2 .  sertraline (ZOLOFT) 100 MG tablet, Take 1 tablet (100 mg total) by mouth daily., Disp: 30 tablet, Rfl: 1 .  simvastatin (ZOCOR) 20 MG tablet, Take 1 tablet (20 mg total) by mouth daily at 6 PM. PLEASE SCHEDULE ANNUAL PHYSICAL, Disp: 30 tablet, Rfl: 1 .  triamcinolone cream (KENALOG) 0.1 %, Apply 1 application topically 2 (two) times daily as needed., Disp: 45 g, Rfl: 1 .  cyclobenzaprine (FLEXERIL) 10 MG tablet, Take 1 tablet (10 mg total) by mouth at bedtime as needed for muscle spasms. (Patient not taking: Reported on 01/23/2017), Disp: 15 tablet, Rfl: 0 .  methocarbamol (ROBAXIN) 500 MG tablet, Take by mouth., Disp: , Rfl:  .  oxyCODONE-acetaminophen (PERCOCET) 7.5-325 MG tablet, Take 1 tablet by mouth every 6 (six) hours as needed for severe pain. (Patient  not taking: Reported on 01/23/2017), Disp: 15 tablet, Rfl: 0 .  predniSONE (DELTASONE) 20 MG tablet,  2 tabs by mouth x 3 days, 1 tab by mouth x 3 days, 1/2 tab by mouth x 3 days (Patient not taking: Reported on 01/23/2017), Disp: 12 tablet, Rfl: 0  ROS  Constitutional: Denies any fever or chills Gastrointestinal: No reported hemesis, hematochezia, vomiting, or acute GI distress Musculoskeletal: Denies any acute onset joint swelling, redness, loss of ROM, or weakness Neurological: No reported episodes of acute onset apraxia, aphasia, dysarthria, agnosia, amnesia, paralysis, loss of coordination, or loss of consciousness  Allergies  Donna Vasquez is allergic to flonase [fluticasone propionate] and sulfa antibiotics.  [place]  Drug: Donna Vasquez  reports that she does not use drugs. Alcohol:  reports that she drinks alcohol. Tobacco:  reports that she has never smoked. She has never used smokeless tobacco. Medical:  has a past medical history of Allergy; Asthma; Chicken pox; Frequent headaches; Hyperlipidemia; IBS (irritable bowel syndrome); Interstitial cystitis; and Urine incontinence. Surgical: Donna Vasquez  has a past surgical history that includes Cholecystectomy; Tubal ligation (1999); Nasal sinus surgery; and cyst removed from back. Family: family history includes COPD in her mother; Cancer in her maternal aunt and paternal aunt; Emphysema in her mother; Heart disease in her father and mother; Hypertension in her father; Polymyalgia rheumatica in her mother; Stroke in her maternal grandmother and mother.  Constitutional Exam  General appearance: Well nourished, well developed, and well hydrated. In no apparent acute distress Vitals:   01/23/17 1243  BP: 138/79  Pulse: 77  Resp: 16  Temp: 98 F (36.7 C)  SpO2: 98%  Weight: 240 lb (108.9 kg)  Height: 5' 7.5" (1.715 m)   BMI Assessment: Estimated body mass index is 37.03 kg/m as calculated from the following:   Height as of this encounter:  5' 7.5" (1.715 m).   Weight as of this encounter: 240 lb (108.9 kg).  BMI interpretation table: BMI level Category Range association with higher incidence of chronic pain  <18 kg/m2 Underweight   18.5-24.9 kg/m2 Ideal body weight   25-29.9 kg/m2 Overweight Increased incidence by 20%  30-34.9 kg/m2 Obese (Class I) Increased incidence by 68%  35-39.9 kg/m2 Severe obesity (Class II) Increased incidence by 136%  >40 kg/m2 Extreme obesity (Class III) Increased incidence by 254%   BMI Readings from Last 4 Encounters:  01/23/17 37.03 kg/m  01/08/17 37.03 kg/m  01/06/17 37.59 kg/m  01/01/17 37.59 kg/m   Wt Readings from Last 4 Encounters:  01/23/17 240 lb (108.9 kg)  01/08/17 240 lb (108.9 kg)  01/06/17 240 lb (108.9 kg)  01/01/17 240 lb (108.9 kg)  Psych/Mental status: Alert, oriented x 3 (person, place, & time)       Eyes: PERLA Respiratory: No evidence of acute respiratory distress  Cervical Spine Area Exam  Skin & Axial Inspection: No masses, redness, edema, swelling, or associated skin lesions Alignment: Symmetrical Functional ROM: Unrestricted ROM      Stability: No instability detected Muscle Tone/Strength: Functionally intact. No obvious neuro-muscular anomalies detected. Sensory (Neurological): Articular pain pattern Palpation: Complains of area being tender to palpation             Left sided neck pain and shoulder pain with cervical extension, pain overlying left cervical facets  Upper Extremity (UE) Exam    Side: Right upper extremity  Side: Left upper extremity  Skin & Extremity Inspection: Skin color, temperature, and hair growth are WNL. No peripheral edema or cyanosis. No masses, redness, swelling, asymmetry, or associated skin lesions. No contractures.  Skin & Extremity Inspection: Skin color,  temperature, and hair growth are WNL. No peripheral edema or cyanosis. No masses, redness, swelling, asymmetry, or associated skin lesions. No contractures.  Functional ROM:  Unrestricted ROM          Functional ROM: Unrestricted ROM          Muscle Tone/Strength: Functionally intact. No obvious neuro-muscular anomalies detected.  Muscle Tone/Strength: Functionally intact. No obvious neuro-muscular anomalies detected.  Sensory (Neurological): Paresthesia (Tingling sensation) occasionally but reduced before          Sensory (Neurological): Unimpaired          Palpation: No palpable anomalies              Palpation: No palpable anomalies              Specialized Test(s): Deferred         Specialized Test(s): Deferred          Thoracic Spine Area Exam  Skin & Axial Inspection: No masses, redness, or swelling Alignment: Symmetrical Functional ROM: Unrestricted ROM Stability: No instability detected Muscle Tone/Strength: Functionally intact. No obvious neuro-muscular anomalies detected. Sensory (Neurological): Unimpaired Muscle strength & Tone: No palpable anomalies  Lumbar Spine Area Exam  Skin & Axial Inspection: No masses, redness, or swelling Alignment: Symmetrical Functional ROM: Unrestricted ROM      Stability: No instability detected Muscle Tone/Strength: Functionally intact. No obvious neuro-muscular anomalies detected. Sensory (Neurological): Unimpaired Palpation: No palpable anomalies       Provocative Tests: Lumbar Hyperextension and rotation test: evaluation deferred today       Lumbar Lateral bending test: evaluation deferred today       Patrick's Maneuver: evaluation deferred today                    Gait & Posture Assessment  Ambulation: Unassisted Gait: Relatively normal for age and body habitus Posture: WNL   Lower Extremity Exam    Side: Right lower extremity  Side: Left lower extremity  Skin & Extremity Inspection: Skin color, temperature, and hair growth are WNL. No peripheral edema or cyanosis. No masses, redness, swelling, asymmetry, or associated skin lesions. No contractures.  Skin & Extremity Inspection: Skin color, temperature, and  hair growth are WNL. No peripheral edema or cyanosis. No masses, redness, swelling, asymmetry, or associated skin lesions. No contractures.  Functional ROM: Unrestricted ROM          Functional ROM: Unrestricted ROM          Muscle Tone/Strength: Functionally intact. No obvious neuro-muscular anomalies detected.  Muscle Tone/Strength: Functionally intact. No obvious neuro-muscular anomalies detected.  Sensory (Neurological): Unimpaired  Sensory (Neurological): Unimpaired  Palpation: No palpable anomalies  Palpation: No palpable anomalies   Assessment  Primary Diagnosis & Pertinent Problem List: The primary encounter diagnosis was Cervical spondylosis with radiculopathy. Diagnoses of Cervical radiculopathy, Neck pain, and Myofascial pain syndrome, cervical were also pertinent to this visit.  Status Diagnosis  Persistent Improving Improving 1. Cervical spondylosis with radiculopathy   2. Cervical radiculopathy   3. Neck pain   4. Myofascial pain syndrome, cervical      Plan of Care  51 year old female with neck and left arm pain, that started after sleeping in an unusual position, with cervical MRI showing Large left-sided disc protrusion in the foraminal entry zone at C6-7 impinges on the left C7 root as it enters the foramen and slightly deflects the cord to the right.  Patient is status post C7-T1, left-sided, cervical epidural steroid injection on 01/08/2017. Patient  states that this has helped with her neck pain and her left upper extremity pain. She states that she has "spotty" areas that are still occasionally painful and numb but not to the extent that it was before her cervical epidural. She states that the pain does not shoot down her left arm as much since her epidural. Patient continues to endorse axial neck pain that is most pronounced overlying her cervical facets, left shoulder, left trapezius region. She does have pain with cervical extension. The pain does not extend beyond her  shoulder region. She does have pain overlying her cervical facets on the left.  Since the patient's radicular symptoms are improved but the patient is still continuing to endorse cervical neck pain which radiates predominantly into her shoulder and trapezius region that could be consistent with a cervical facet referral pattern. I discussed performing left sided cervical facet blocks to target left C4, C5, C6, C7 medial branch nerves at the respective facet levels. Risks and benefits were discussed with the patient. Diagnostic blocks are helpful, goal will be radiofrequency ablation. If they are not helpful we can discuss repeating cervical epidural steroid injection.  Plan: -Left cervical facet blocks to target C4, C5, C6, C7 medial branch nerves with goal RFA, with sedation for cervical spondylosis with radiculopathy, cervical facet arthropathy.  Lab-work, procedure(s), and/or referral(s): Orders Placed This Encounter  Procedures  . CERVICAL FACET (MEDIAL BRANCH NERVE BLOCK)      Interventional management options: Planned, scheduled, and/or pending:    Left cervical facet blocks to target C4, C5, C6, C7 medial branch nerves with goal RFA,   Considering:   -Consider cervical epidural steroid injections in the future #2 (#1- 8/22 >60% improvement in radicular pain)   PRN Procedures:   To be determined at a later time   Provider-requested follow-up: Return for Procedure.  No future appointments.  Primary Care Physician: Jearld Fenton, NP Location: [facility] Note by: Gillis Santa, M.D Date: 01/23/2017; Time: 1:28 PM  Patient Instructions    I am glad to hear that your pain is doing better.  As we discussed, we will schedule you for a left C4, C5, C6, C7 facet block to see if it helps with her pain. If it does, we can proceed with a radiofrequency ablation which should hopefully provide you with longer lasting pain relief GENERAL RISKS AND  COMPLICATIONS  What are the risk, side effects and possible complications? Generally speaking, most procedures are safe.  However, with any procedure there are risks, side effects, and the possibility of complications.  The risks and complications are dependent upon the sites that are lesioned, or the type of nerve block to be performed.  The closer the procedure is to the spine, the more serious the risks are.  Great care is taken when placing the radio frequency needles, block needles or lesioning probes, but sometimes complications can occur. 1. Infection: Any time there is an injection through the skin, there is a risk of infection.  This is why sterile conditions are used for these blocks.  There are four possible types of infection. 1. Localized skin infection. 2. Central Nervous System Infection-This can be in the form of Meningitis, which can be deadly. 3. Epidural Infections-This can be in the form of an epidural abscess, which can cause pressure inside of the spine, causing compression of the spinal cord with subsequent paralysis. This would require an emergency surgery to decompress, and there are no guarantees that the  patient would recover from the paralysis. 4. Discitis-This is an infection of the intervertebral discs.  It occurs in about 1% of discography procedures.  It is difficult to treat and it may lead to surgery.        2. Pain: the needles have to go through skin and soft tissues, will cause soreness.       3. Damage to internal structures:  The nerves to be lesioned may be near blood vessels or    other nerves which can be potentially damaged.       4. Bleeding: Bleeding is more common if the patient is taking blood thinners such as  aspirin, Coumadin, Ticiid, Plavix, etc., or if he/she have some genetic predisposition  such as hemophilia. Bleeding into the spinal canal can cause compression of the spinal  cord with subsequent paralysis.  This would require an emergency surgery  to  decompress and there are no guarantees that the patient would recover from the  paralysis.       5. Pneumothorax:  Puncturing of a lung is a possibility, every time a needle is introduced in  the area of the chest or upper back.  Pneumothorax refers to free air around the  collapsed lung(s), inside of the thoracic cavity (chest cavity).  Another two possible  complications related to a similar event would include: Hemothorax and Chylothorax.   These are variations of the Pneumothorax, where instead of air around the collapsed  lung(s), you may have blood or chyle, respectively.       6. Spinal headaches: They may occur with any procedures in the area of the spine.       7. Persistent CSF (Cerebro-Spinal Fluid) leakage: This is a rare problem, but may occur  with prolonged intrathecal or epidural catheters either due to the formation of a fistulous  track or a dural tear.       8. Nerve damage: By working so close to the spinal cord, there is always a possibility of  nerve damage, which could be as serious as a permanent spinal cord injury with  paralysis.       9. Death:  Although rare, severe deadly allergic reactions known as "Anaphylactic  reaction" can occur to any of the medications used.      10. Worsening of the symptoms:  We can always make thing worse.  What are the chances of something like this happening? Chances of any of this occuring are extremely low.  By statistics, you have more of a chance of getting killed in a motor vehicle accident: while driving to the hospital than any of the above occurring .  Nevertheless, you should be aware that they are possibilities.  In general, it is similar to taking a shower.  Everybody knows that you can slip, hit your head and get killed.  Does that mean that you should not shower again?  Nevertheless always keep in mind that statistics do not mean anything if you happen to be on the wrong side of them.  Even if a procedure has a 1 (one) in a 1,000,000  (million) chance of going wrong, it you happen to be that one..Also, keep in mind that by statistics, you have more of a chance of having something go wrong when taking medications.  Who should not have this procedure? If you are on a blood thinning medication (e.g. Coumadin, Plavix, see list of "Blood Thinners"), or if you have an active infection going on, you should  not have the procedure.  If you are taking any blood thinners, please inform your physician.  How should I prepare for this procedure?  Do not eat or drink anything at least six hours prior to the procedure.  Bring a driver with you .  It cannot be a taxi.  Come accompanied by an adult that can drive you back, and that is strong enough to help you if your legs get weak or numb from the local anesthetic.  Take all of your medicines the morning of the procedure with just enough water to swallow them.  If you have diabetes, make sure that you are scheduled to have your procedure done first thing in the morning, whenever possible.  If you have diabetes, take only half of your insulin dose and notify our nurse that you have done so as soon as you arrive at the clinic.  If you are diabetic, but only take blood sugar pills (oral hypoglycemic), then do not take them on the morning of your procedure.  You may take them after you have had the procedure.  Do not take aspirin or any aspirin-containing medications, at least eleven (11) days prior to the procedure.  They may prolong bleeding.  Wear loose fitting clothing that may be easy to take off and that you would not mind if it got stained with Betadine or blood.  Do not wear any jewelry or perfume  Remove any nail coloring.  It will interfere with some of our monitoring equipment.  NOTE: Remember that this is not meant to be interpreted as a complete list of all possible complications.  Unforeseen problems may occur.  BLOOD THINNERS The following drugs contain aspirin or other  products, which can cause increased bleeding during surgery and should not be taken for 2 weeks prior to and 1 week after surgery.  If you should need take something for relief of minor pain, you may take acetaminophen which is found in Tylenol,m Datril, Anacin-3 and Panadol. It is not blood thinner. The products listed below are.  Do not take any of the products listed below in addition to any listed on your instruction sheet.  A.P.C or A.P.C with Codeine Codeine Phosphate Capsules #3 Ibuprofen Ridaura  ABC compound Congesprin Imuran rimadil  Advil Cope Indocin Robaxisal  Alka-Seltzer Effervescent Pain Reliever and Antacid Coricidin or Coricidin-D  Indomethacin Rufen  Alka-Seltzer plus Cold Medicine Cosprin Ketoprofen S-A-C Tablets  Anacin Analgesic Tablets or Capsules Coumadin Korlgesic Salflex  Anacin Extra Strength Analgesic tablets or capsules CP-2 Tablets Lanoril Salicylate  Anaprox Cuprimine Capsules Levenox Salocol  Anexsia-D Dalteparin Magan Salsalate  Anodynos Darvon compound Magnesium Salicylate Sine-off  Ansaid Dasin Capsules Magsal Sodium Salicylate  Anturane Depen Capsules Marnal Soma  APF Arthritis pain formula Dewitt's Pills Measurin Stanback  Argesic Dia-Gesic Meclofenamic Sulfinpyrazone  Arthritis Bayer Timed Release Aspirin Diclofenac Meclomen Sulindac  Arthritis pain formula Anacin Dicumarol Medipren Supac  Analgesic (Safety coated) Arthralgen Diffunasal Mefanamic Suprofen  Arthritis Strength Bufferin Dihydrocodeine Mepro Compound Suprol  Arthropan liquid Dopirydamole Methcarbomol with Aspirin Synalgos  ASA tablets/Enseals Disalcid Micrainin Tagament  Ascriptin Doan's Midol Talwin  Ascriptin A/D Dolene Mobidin Tanderil  Ascriptin Extra Strength Dolobid Moblgesic Ticlid  Ascriptin with Codeine Doloprin or Doloprin with Codeine Momentum Tolectin  Asperbuf Duoprin Mono-gesic Trendar  Aspergum Duradyne Motrin or Motrin IB Triminicin  Aspirin plain, buffered or enteric  coated Durasal Myochrisine Trigesic  Aspirin Suppositories Easprin Nalfon Trillsate  Aspirin with Codeine Ecotrin Regular or Extra Strength Naprosyn Uracel  Atromid-S Efficin Naproxen Ursinus  Auranofin Capsules Elmiron Neocylate Vanquish  Axotal Emagrin Norgesic Verin  Azathioprine Empirin or Empirin with Codeine Normiflo Vitamin E  Azolid Emprazil Nuprin Voltaren  Bayer Aspirin plain, buffered or children's or timed BC Tablets or powders Encaprin Orgaran Warfarin Sodium  Buff-a-Comp Enoxaparin Orudis Zorpin  Buff-a-Comp with Codeine Equegesic Os-Cal-Gesic   Buffaprin Excedrin plain, buffered or Extra Strength Oxalid   Bufferin Arthritis Strength Feldene Oxphenbutazone   Bufferin plain or Extra Strength Feldene Capsules Oxycodone with Aspirin   Bufferin with Codeine Fenoprofen Fenoprofen Pabalate or Pabalate-SF   Buffets II Flogesic Panagesic   Buffinol plain or Extra Strength Florinal or Florinal with Codeine Panwarfarin   Buf-Tabs Flurbiprofen Penicillamine   Butalbital Compound Four-way cold tablets Penicillin   Butazolidin Fragmin Pepto-Bismol   Carbenicillin Geminisyn Percodan   Carna Arthritis Reliever Geopen Persantine   Carprofen Gold's salt Persistin   Chloramphenicol Goody's Phenylbutazone   Chloromycetin Haltrain Piroxlcam   Clmetidine heparin Plaquenil   Cllnoril Hyco-pap Ponstel   Clofibrate Hydroxy chloroquine Propoxyphen         Before stopping any of these medications, be sure to consult the physician who ordered them.  Some, such as Coumadin (Warfarin) are ordered to prevent or treat serious conditions such as "deep thrombosis", "pumonary embolisms", and other heart problems.  The amount of time that you may need off of the medication may also vary with the medication and the reason for which you were taking it.  If you are taking any of these medications, please make sure you notify your pain physician before you undergo any procedures.          Facet Joint  Block The facet joints connect the bones of the spine (vertebrae). They make it possible for you to bend, twist, and make other movements with your spine. They also keep you from bending too far, twisting too far, and making other excessive movements. A facet joint block is a procedure where a numbing medicine (anesthetic) is injected into a facet joint. Often, a type of anti-inflammatory medicine called a steroid is also injected. A facet joint block may be done to diagnose neck or back pain. If the pain gets better after a facet joint block, it means the pain is probably coming from the facet joint. If the pain does not get better, it means the pain is probably not coming from the facet joint. A facet joint block may also be done to relieve neck or back pain caused by an inflamed facet joint. A facet joint block is only done to relieve pain if the pain does not improve with other methods, such as medicine, exercise programs, and physical therapy. Tell a health care provider about:  Any allergies you have.  All medicines you are taking, including vitamins, herbs, eye drops, creams, and over-the-counter medicines.  Any problems you or family members have had with anesthetic medicines.  Any blood disorders you have.  Any surgeries you have had.  Any medical conditions you have.  Whether you are pregnant or may be pregnant. What are the risks? Generally, this is a safe procedure. However, problems may occur, including:  Bleeding.  Injury to a nerve near the injection site.  Pain at the injection site.  Weakness or numbness in areas controlled by nerves near the injection site.  Infection.  Temporary fluid retention.  Allergic reactions to medicines or dyes.  Injury to other structures or organs near the injection site.  What happens before the procedure?  Follow instructions from your health care provider about eating or drinking restrictions.  Ask your health care provider  about: ? Changing or stopping your regular medicines. This is especially important if you are taking diabetes medicines or blood thinners. ? Taking medicines such as aspirin and ibuprofen. These medicines can thin your blood. Do not take these medicines before your procedure if your health care provider instructs you not to.  Do not take any new dietary supplements or medicines without asking your health care provider first.  Plan to have someone take you home after the procedure. What happens during the procedure?  You may need to remove your clothing and dress in an open-back gown.  The procedure will be done while you are lying on an X-ray table. You will most likely be asked to lie on your stomach, but you may be asked to lie in a different position if an injection will be made in your neck.  Machines will be used to monitor your oxygen levels, heart rate, and blood pressure.  If an injection will be made in your neck, an IV tube will be inserted into one of your veins. Fluids and medicine will flow directly into your body through the IV tube.  The area over the facet joint where the injection will be made will be cleaned with soap. The surrounding skin will be covered with clean drapes.  A numbing medicine (local anesthetic) will be applied to your skin. Your skin may sting or burn for a moment.  A video X-ray machine (fluoroscopy) will be used to locate the joint. In some cases, a CT scan may be used.  A contrast dye may be injected into the facet joint area to help locate the joint.  When the joint is located, an anesthetic will be injected into the joint through the needle.  Your health care provider will ask you whether you feel pain relief. If you do feel relief, a steroid may be injected to provide pain relief for a longer period of time. If you do not feel relief or feel only partial relief, additional injections of an anesthetic may be made in other facet joints.  The needle  will be removed.  Your skin will be cleaned.  A bandage (dressing) will be applied over each injection site. The procedure may vary among health care providers and hospitals. What happens after the procedure?  You will be observed for 15-30 minutes before being allowed to go home. This information is not intended to replace advice given to you by your health care provider. Make sure you discuss any questions you have with your health care provider. Document Released: 09/25/2006 Document Revised: 06/07/2015 Document Reviewed: 01/30/2015 Elsevier Interactive Patient Education  [employer].

## 2017-01-23 NOTE — Patient Instructions (Addendum)
I am glad to hear that your pain is doing better.  As we discussed, we will schedule you for a left C4, C5, C6, C7 facet block to see if it helps with her pain. If it does, we can proceed with a radiofrequency ablation which should hopefully provide you with longer lasting pain relief GENERAL RISKS AND COMPLICATIONS  What are the risk, side effects and possible complications? Generally speaking, most procedures are safe.  However, with any procedure there are risks, side effects, and the possibility of complications.  The risks and complications are dependent upon the sites that are lesioned, or the type of nerve block to be performed.  The closer the procedure is to the spine, the more serious the risks are.  Great care is taken when placing the radio frequency needles, block needles or lesioning probes, but sometimes complications can occur. 1. Infection: Any time there is an injection through the skin, there is a risk of infection.  This is why sterile conditions are used for these blocks.  There are four possible types of infection. 1. Localized skin infection. 2. Central Nervous System Infection-This can be in the form of Meningitis, which can be deadly. 3. Epidural Infections-This can be in the form of an epidural abscess, which can cause pressure inside of the spine, causing compression of the spinal cord with subsequent paralysis. This would require an emergency surgery to decompress, and there are no guarantees that the patient would recover from the paralysis. 4. Discitis-This is an infection of the intervertebral discs.  It occurs in about 1% of discography procedures.  It is difficult to treat and it may lead to surgery.        2. Pain: the needles have to go through skin and soft tissues, will cause soreness.       3. Damage to internal structures:  The nerves to be lesioned may be near blood vessels or    other nerves which can be potentially damaged.       4. Bleeding: Bleeding is  more common if the patient is taking blood thinners such as  aspirin, Coumadin, Ticiid, Plavix, etc., or if he/she have some genetic predisposition  such as hemophilia. Bleeding into the spinal canal can cause compression of the spinal  cord with subsequent paralysis.  This would require an emergency surgery to  decompress and there are no guarantees that the patient would recover from the  paralysis.       5. Pneumothorax:  Puncturing of a lung is a possibility, every time a needle is introduced in  the area of the chest or upper back.  Pneumothorax refers to free air around the  collapsed lung(s), inside of the thoracic cavity (chest cavity).  Another two possible  complications related to a similar event would include: Hemothorax and Chylothorax.   These are variations of the Pneumothorax, where instead of air around the collapsed  lung(s), you may have blood or chyle, respectively.       6. Spinal headaches: They may occur with any procedures in the area of the spine.       7. Persistent CSF (Cerebro-Spinal Fluid) leakage: This is a rare problem, but may occur  with prolonged intrathecal or epidural catheters either due to the formation of a fistulous  track or a dural tear.       8. Nerve damage: By working so close to the spinal cord, there is always a possibility of  nerve damage, which could be  as serious as a permanent spinal cord injury with  paralysis.       9. Death:  Although rare, severe deadly allergic reactions known as "Anaphylactic  reaction" can occur to any of the medications used.      10. Worsening of the symptoms:  We can always make thing worse.  What are the chances of something like this happening? Chances of any of this occuring are extremely low.  By statistics, you have more of a chance of getting killed in a motor vehicle accident: while driving to the hospital than any of the above occurring .  Nevertheless, you should be aware that they are possibilities.  In general, it is  similar to taking a shower.  Everybody knows that you can slip, hit your head and get killed.  Does that mean that you should not shower again?  Nevertheless always keep in mind that statistics do not mean anything if you happen to be on the wrong side of them.  Even if a procedure has a 1 (one) in a 1,000,000 (million) chance of going wrong, it you happen to be that one..Also, keep in mind that by statistics, you have more of a chance of having something go wrong when taking medications.  Who should not have this procedure? If you are on a blood thinning medication (e.g. Coumadin, Plavix, see list of "Blood Thinners"), or if you have an active infection going on, you should not have the procedure.  If you are taking any blood thinners, please inform your physician.  How should I prepare for this procedure?  Do not eat or drink anything at least six hours prior to the procedure.  Bring a driver with you .  It cannot be a taxi.  Come accompanied by an adult that can drive you back, and that is strong enough to help you if your legs get weak or numb from the local anesthetic.  Take all of your medicines the morning of the procedure with just enough water to swallow them.  If you have diabetes, make sure that you are scheduled to have your procedure done first thing in the morning, whenever possible.  If you have diabetes, take only half of your insulin dose and notify our nurse that you have done so as soon as you arrive at the clinic.  If you are diabetic, but only take blood sugar pills (oral hypoglycemic), then do not take them on the morning of your procedure.  You may take them after you have had the procedure.  Do not take aspirin or any aspirin-containing medications, at least eleven (11) days prior to the procedure.  They may prolong bleeding.  Wear loose fitting clothing that may be easy to take off and that you would not mind if it got stained with Betadine or blood.  Do not wear any  jewelry or perfume  Remove any nail coloring.  It will interfere with some of our monitoring equipment.  NOTE: Remember that this is not meant to be interpreted as a complete list of all possible complications.  Unforeseen problems may occur.  BLOOD THINNERS The following drugs contain aspirin or other products, which can cause increased bleeding during surgery and should not be taken for 2 weeks prior to and 1 week after surgery.  If you should need take something for relief of minor pain, you may take acetaminophen which is found in Tylenol,m Datril, Anacin-3 and Panadol. It is not blood thinner. The products listed below  are.  Do not take any of the products listed below in addition to any listed on your instruction sheet.  A.P.C or A.P.C with Codeine Codeine Phosphate Capsules #3 Ibuprofen Ridaura  ABC compound Congesprin Imuran rimadil  Advil Cope Indocin Robaxisal  Alka-Seltzer Effervescent Pain Reliever and Antacid Coricidin or Coricidin-D  Indomethacin Rufen  Alka-Seltzer plus Cold Medicine Cosprin Ketoprofen S-A-C Tablets  Anacin Analgesic Tablets or Capsules Coumadin Korlgesic Salflex  Anacin Extra Strength Analgesic tablets or capsules CP-2 Tablets Lanoril Salicylate  Anaprox Cuprimine Capsules Levenox Salocol  Anexsia-D Dalteparin Magan Salsalate  Anodynos Darvon compound Magnesium Salicylate Sine-off  Ansaid Dasin Capsules Magsal Sodium Salicylate  Anturane Depen Capsules Marnal Soma  APF Arthritis pain formula Dewitt's Pills Measurin Stanback  Argesic Dia-Gesic Meclofenamic Sulfinpyrazone  Arthritis Bayer Timed Release Aspirin Diclofenac Meclomen Sulindac  Arthritis pain formula Anacin Dicumarol Medipren Supac  Analgesic (Safety coated) Arthralgen Diffunasal Mefanamic Suprofen  Arthritis Strength Bufferin Dihydrocodeine Mepro Compound Suprol  Arthropan liquid Dopirydamole Methcarbomol with Aspirin Synalgos  ASA tablets/Enseals Disalcid Micrainin Tagament  Ascriptin Doan's  Midol Talwin  Ascriptin A/D Dolene Mobidin Tanderil  Ascriptin Extra Strength Dolobid Moblgesic Ticlid  Ascriptin with Codeine Doloprin or Doloprin with Codeine Momentum Tolectin  Asperbuf Duoprin Mono-gesic Trendar  Aspergum Duradyne Motrin or Motrin IB Triminicin  Aspirin plain, buffered or enteric coated Durasal Myochrisine Trigesic  Aspirin Suppositories Easprin Nalfon Trillsate  Aspirin with Codeine Ecotrin Regular or Extra Strength Naprosyn Uracel  Atromid-S Efficin Naproxen Ursinus  Auranofin Capsules Elmiron Neocylate Vanquish  Axotal Emagrin Norgesic Verin  Azathioprine Empirin or Empirin with Codeine Normiflo Vitamin E  Azolid Emprazil Nuprin Voltaren  Bayer Aspirin plain, buffered or children's or timed BC Tablets or powders Encaprin Orgaran Warfarin Sodium  Buff-a-Comp Enoxaparin Orudis Zorpin  Buff-a-Comp with Codeine Equegesic Os-Cal-Gesic   Buffaprin Excedrin plain, buffered or Extra Strength Oxalid   Bufferin Arthritis Strength Feldene Oxphenbutazone   Bufferin plain or Extra Strength Feldene Capsules Oxycodone with Aspirin   Bufferin with Codeine Fenoprofen Fenoprofen Pabalate or Pabalate-SF   Buffets II Flogesic Panagesic   Buffinol plain or Extra Strength Florinal or Florinal with Codeine Panwarfarin   Buf-Tabs Flurbiprofen Penicillamine   Butalbital Compound Four-way cold tablets Penicillin   Butazolidin Fragmin Pepto-Bismol   Carbenicillin Geminisyn Percodan   Carna Arthritis Reliever Geopen Persantine   Carprofen Gold's salt Persistin   Chloramphenicol Goody's Phenylbutazone   Chloromycetin Haltrain Piroxlcam   Clmetidine heparin Plaquenil   Cllnoril Hyco-pap Ponstel   Clofibrate Hydroxy chloroquine Propoxyphen         Before stopping any of these medications, be sure to consult the physician who ordered them.  Some, such as Coumadin (Warfarin) are ordered to prevent or treat serious conditions such as "deep thrombosis", "pumonary embolisms", and other heart  problems.  The amount of time that you may need off of the medication may also vary with the medication and the reason for which you were taking it.  If you are taking any of these medications, please make sure you notify your pain physician before you undergo any procedures.          Facet Joint Block The facet joints connect the bones of the spine (vertebrae). They make it possible for you to bend, twist, and make other movements with your spine. They also keep you from bending too far, twisting too far, and making other excessive movements. A facet joint block is a procedure where a numbing medicine (anesthetic) is injected into a facet joint. Often, a type  of anti-inflammatory medicine called a steroid is also injected. A facet joint block may be done to diagnose neck or back pain. If the pain gets better after a facet joint block, it means the pain is probably coming from the facet joint. If the pain does not get better, it means the pain is probably not coming from the facet joint. A facet joint block may also be done to relieve neck or back pain caused by an inflamed facet joint. A facet joint block is only done to relieve pain if the pain does not improve with other methods, such as medicine, exercise programs, and physical therapy. Tell a health care provider about:  Any allergies you have.  All medicines you are taking, including vitamins, herbs, eye drops, creams, and over-the-counter medicines.  Any problems you or family members have had with anesthetic medicines.  Any blood disorders you have.  Any surgeries you have had.  Any medical conditions you have.  Whether you are pregnant or may be pregnant. What are the risks? Generally, this is a safe procedure. However, problems may occur, including:  Bleeding.  Injury to a nerve near the injection site.  Pain at the injection site.  Weakness or numbness in areas controlled by nerves near the injection  site.  Infection.  Temporary fluid retention.  Allergic reactions to medicines or dyes.  Injury to other structures or organs near the injection site.  What happens before the procedure?  Follow instructions from your health care provider about eating or drinking restrictions.  Ask your health care provider about: ? Changing or stopping your regular medicines. This is especially important if you are taking diabetes medicines or blood thinners. ? Taking medicines such as aspirin and ibuprofen. These medicines can thin your blood. Do not take these medicines before your procedure if your health care provider instructs you not to.  Do not take any new dietary supplements or medicines without asking your health care provider first.  Plan to have someone take you home after the procedure. What happens during the procedure?  You may need to remove your clothing and dress in an open-back gown.  The procedure will be done while you are lying on an X-ray table. You will most likely be asked to lie on your stomach, but you may be asked to lie in a different position if an injection will be made in your neck.  Machines will be used to monitor your oxygen levels, heart rate, and blood pressure.  If an injection will be made in your neck, an IV tube will be inserted into one of your veins. Fluids and medicine will flow directly into your body through the IV tube.  The area over the facet joint where the injection will be made will be cleaned with soap. The surrounding skin will be covered with clean drapes.  A numbing medicine (local anesthetic) will be applied to your skin. Your skin may sting or burn for a moment.  A video X-ray machine (fluoroscopy) will be used to locate the joint. In some cases, a CT scan may be used.  A contrast dye may be injected into the facet joint area to help locate the joint.  When the joint is located, an anesthetic will be injected into the joint through the  needle.  Your health care provider will ask you whether you feel pain relief. If you do feel relief, a steroid may be injected to provide pain relief for a longer period of time.  If you do not feel relief or feel only partial relief, additional injections of an anesthetic may be made in other facet joints.  The needle will be removed.  Your skin will be cleaned.  A bandage (dressing) will be applied over each injection site. The procedure may vary among health care providers and hospitals. What happens after the procedure?  You will be observed for 15-30 minutes before being allowed to go home. This information is not intended to replace advice given to you by your health care provider. Make sure you discuss any questions you have with your health care provider. Document Released: 09/25/2006 Document Revised: 06/07/2015 Document Reviewed: 01/30/2015 Elsevier Interactive Patient Education  [employer].

## 2017-01-29 ENCOUNTER — Telehealth: Payer: Self-pay | Admitting: Internal Medicine

## 2017-01-29 NOTE — Telephone Encounter (Signed)
Patient Name: Donna Vasquez  DOB: [birth year]    Initial Comment husband has shingles, wants to know about shingles in general and health risk to others. if cant be reached on first #, pls call 3rd number   Nurse Assessment  Nurse: Stefano Gaul, RN, Dwana Curd Date/Time Lamount Cohen Time): 01/29/2017 11:02:02 AM  Confirm and document reason for call. If symptomatic, describe symptoms. ---Caller states spouse has shingles. She does not have any symptoms. He was diagnosed this am and he has gotten medication. Both of his children have had the chickenpox vaccine.  Does the patient have any new or worsening symptoms? ---No  Please document clinical information provided and list any resource used. ---Advised caller as per guideline that spouse needs to avoid contact with pregnant women; people with decreased immune systems; anyone who has not had chickenpox or been immunized with the chickenpox vaccine. Others can not get shingles but they can get chickenpox if they have not had chicken pox before. She also needs to think about getting the shingles vaccine. Verbalized understanding.     Guidelines    Guideline Title Affirmed Question Affirmed Notes       Final Disposition User   Clinical Call Bridgeton, RN, Vera    Comments  called primary number and left number. will call 3rd number.  called tertiary number and left message.

## 2017-02-05 ENCOUNTER — Encounter: Payer: Self-pay | Admitting: Student in an Organized Health Care Education/Training Program

## 2017-02-05 ENCOUNTER — Ambulatory Visit (HOSPITAL_BASED_OUTPATIENT_CLINIC_OR_DEPARTMENT_OTHER): Payer: 59 | Admitting: Student in an Organized Health Care Education/Training Program

## 2017-02-05 ENCOUNTER — Ambulatory Visit
Admission: RE | Admit: 2017-02-05 | Discharge: 2017-02-05 | Disposition: A | Discharge status: Home / Self Care | Payer: 59 | Source: Ambulatory Visit | Attending: Student in an Organized Health Care Education/Training Program | Admitting: Student in an Organized Health Care Education/Training Program

## 2017-02-05 DIAGNOSIS — M4722 Other spondylosis with radiculopathy, cervical region: Secondary | ICD-10-CM | POA: Diagnosis not present

## 2017-02-05 DIAGNOSIS — Z9889 Other specified postprocedural states: Secondary | ICD-10-CM | POA: Insufficient documentation

## 2017-02-05 DIAGNOSIS — Z9851 Tubal ligation status: Secondary | ICD-10-CM | POA: Diagnosis not present

## 2017-02-05 DIAGNOSIS — M25512 Pain in left shoulder: Secondary | ICD-10-CM | POA: Insufficient documentation

## 2017-02-05 DIAGNOSIS — Z9049 Acquired absence of other specified parts of digestive tract: Secondary | ICD-10-CM | POA: Diagnosis not present

## 2017-02-05 DIAGNOSIS — M542 Cervicalgia: Secondary | ICD-10-CM | POA: Insufficient documentation

## 2017-02-05 MED ORDER — SODIUM CHLORIDE 0.9% FLUSH
2.0000 mL | Freq: Once | INTRAVENOUS | Status: AC
  Administered 2017-02-05: 10 mL

## 2017-02-05 MED ORDER — LIDOCAINE HCL (PF) 1 % IJ SOLN
4.5000 mL | Freq: Once | INTRAMUSCULAR | Status: AC
  Administered 2017-02-05: 5 mL
  Filled 2017-02-05: qty 5

## 2017-02-05 MED ORDER — LACTATED RINGERS IV SOLN
1000.0000 mL | Freq: Once | INTRAVENOUS | Status: AC
  Administered 2017-02-05: 1000 mL via INTRAVENOUS

## 2017-02-05 MED ORDER — DEXAMETHASONE SODIUM PHOSPHATE 10 MG/ML IJ SOLN
10.0000 mg | Freq: Once | INTRAMUSCULAR | Status: AC
  Administered 2017-02-05: 10 mg
  Filled 2017-02-05: qty 1

## 2017-02-05 MED ORDER — SODIUM CHLORIDE 0.9 % IJ SOLN
INTRAMUSCULAR | Status: AC
  Filled 2017-02-05: qty 10

## 2017-02-05 MED ORDER — FENTANYL CITRATE (PF) 100 MCG/2ML IJ SOLN
25.0000 ug | INTRAMUSCULAR | Status: DC | PRN
Start: 2017-02-05 — End: 2017-02-05
  Administered 2017-02-05: 75 ug via INTRAVENOUS
  Filled 2017-02-05: qty 2

## 2017-02-05 MED ORDER — ROPIVACAINE HCL 2 MG/ML IJ SOLN
2.0000 mL | Freq: Once | INTRAMUSCULAR | Status: AC
  Administered 2017-02-05: 10 mL via EPIDURAL
  Filled 2017-02-05: qty 10

## 2017-02-05 MED ORDER — IOPAMIDOL (ISOVUE-M 200) INJECTION 41%
10.0000 mL | Freq: Once | INTRAMUSCULAR | Status: AC
  Administered 2017-02-05: 10 mL via EPIDURAL
  Filled 2017-02-05: qty 10

## 2017-02-05 NOTE — Progress Notes (Signed)
Patient's Name: Donna Vasquez  MRN: [id number]  Referring Provider: Edward Jolly, MD  DOB: 1966-01-24  PCP: Lorre Munroe, NP  DOS: 02/05/2017  Note by: Edward Jolly, MD  Service setting: Ambulatory outpatient  Specialty: Interventional Pain Management  Patient type: Established  Location: [facility] (AMB) Pain Management Facility  Visit type: Interventional Procedure   Primary Reason for Visit: Interventional Pain Management Treatment. CC: Neck Pain (left sided) and Shoulder Pain (left)  Procedure:  Anesthesia, Analgesia, Anxiolysis:  Type: Diagnostic Cervical Facet Medial Branch Block(s) Region: Posterolateral cervical spine region Level:  C4, C5, C6, & C7 Medial Branch Level(s) Laterality: Left-Sided Paraspinal  Type: Local Anesthesia with Moderate (Conscious) Sedation Local Anesthetic: Lidocaine 1% Route: Intravenous (IV) IV Access: Secured Sedation: Meaningful verbal contact was maintained at all times during the procedure  Indication(s): Analgesia and Anxiety  Indications: 1. Cervical spondylosis with radiculopathy    Pain Score: Pre-procedure: 3 /10 Post-procedure: 0-No pain/10  Pre-op Assessment:  Ms. Randell is a 50 y.o. (year old), female patient, seen today for interventional treatment. She  has a past surgical history that includes Cholecystectomy; Tubal ligation (1999); Nasal sinus surgery; and cyst removed from back. Ms. Marku has a current medication list which includes the following prescription(s): albuterol, aspirin, cetirizine, vitamin d-3, clonazepam, cyclobenzaprine, fluticasone-salmeterol, homeopathic products, hyoscyamine, meloxicam, methocarbamol, mometasone, montelukast, niacin, omeprazole, oxycodone-acetaminophen, prednisone, sertraline, simvastatin, and triamcinolone cream, and the following Facility-Administered Medications: fentanyl. Her primarily concern today is the Neck Pain (left sided) and Shoulder Pain (left)  51 year old female with neck and left arm  pain, that started after sleeping in an unusual position, with cervical MRI showing Large left-sided disc protrusion in the foraminal entry zone at C6-7 impinges on the left C7 root as it enters the foramen and slightly deflects the cord to the right.  Patient is status post C7-T1, left-sided, cervical epidural steroid injection on 01/08/2017. Patient states that this has helped with her neck pain and her left upper extremity pain. She states that she has "spotty" areas that are still occasionally painful and numb but not to the extent that it was before her cervical epidural.  Since the patient's radicular symptoms are improved but the patient is still continuing to endorse cervical neck pain which radiates predominantly into her shoulder and trapezius region that could be consistent with a cervical facet referral pattern. I discussed performing left sided cervical facet blocks to target left C4, C5, C6, C7 medial branch nerves at the respective facet levels. Risks and benefits were discussed with the patient. Diagnostic blocks are helpful, goal will be radiofrequency ablation. If they are not helpful we can discuss repeating cervical epidural steroid injection.  Initial Vital Signs: There were no vitals taken for this visit. BMI: Estimated body mass index is 37.03 kg/m as calculated from the following:   Height as of this encounter: 5' 7.5" (1.715 m).   Weight as of this encounter: 240 lb (108.9 kg).  Risk Assessment: Allergies: Reviewed. She is allergic to flonase [fluticasone propionate] and sulfa antibiotics.  Allergy Precautions: None required Coagulopathies: Reviewed. None identified.  Blood-thinner therapy: None at this time Active Infection(s): Reviewed. None identified. Ms. Burland is afebrile  Site Confirmation: Ms. Mcclung was asked to confirm the procedure and laterality before marking the site Procedure checklist: Completed Consent: Before the procedure and under the influence of  no sedative(s), amnesic(s), or anxiolytics, the patient was informed of the treatment options, risks and possible complications. To fulfill our ethical and legal obligations, as recommended by  the American Medical Association's Code of Ethics, I have informed the patient of my clinical impression; the nature and purpose of the treatment or procedure; the risks, benefits, and possible complications of the intervention; the alternatives, including doing nothing; the risk(s) and benefit(s) of the alternative treatment(s) or procedure(s); and the risk(s) and benefit(s) of doing nothing. The patient was provided information about the general risks and possible complications associated with the procedure. These may include, but are not limited to: failure to achieve desired goals, infection, bleeding, organ or nerve damage, allergic reactions, paralysis, and death. In addition, the patient was informed of those risks and complications associated to Spine-related procedures, such as failure to decrease pain; infection (i.e.: Meningitis, epidural or intraspinal abscess); bleeding (i.e.: epidural hematoma, subarachnoid hemorrhage, or any other type of intraspinal or peri-dural bleeding); organ or nerve damage (i.e.: Any type of peripheral nerve, nerve root, or spinal cord injury) with subsequent damage to sensory, motor, and/or autonomic systems, resulting in permanent pain, numbness, and/or weakness of one or several areas of the body; allergic reactions; (i.e.: anaphylactic reaction); and/or death. Furthermore, the patient was informed of those risks and complications associated with the medications. These include, but are not limited to: allergic reactions (i.e.: anaphylactic or anaphylactoid reaction(s)); adrenal axis suppression; blood sugar elevation that in diabetics may result in ketoacidosis or comma; water retention that in patients with history of congestive heart failure may result in shortness of breath,  pulmonary edema, and decompensation with resultant heart failure; weight gain; swelling or edema; medication-induced neural toxicity; particulate matter embolism and blood vessel occlusion with resultant organ, and/or nervous system infarction; and/or aseptic necrosis of one or more joints. Finally, the patient was informed that Medicine is not an exact science; therefore, there is also the possibility of unforeseen or unpredictable risks and/or possible complications that may result in a catastrophic outcome. The patient indicated having understood very clearly. We have given the patient no guarantees and we have made no promises. Enough time was given to the patient to ask questions, all of which were answered to the patient's satisfaction. Ms. Stoves has indicated that she wanted to continue with the procedure. Attestation: I, the ordering provider, attest that I have discussed with the patient the benefits, risks, side-effects, alternatives, likelihood of achieving goals, and potential problems during recovery for the procedure that I have provided informed consent. Date: 02/05/2017; Time: 9:15 AM  Pre-Procedure Preparation:  Monitoring: As per clinic protocol. Respiration, ETCO2, SpO2, BP, heart rate and rhythm monitor placed and checked for adequate function Safety Precautions: Patient was assessed for positional comfort and pressure points before starting the procedure. Time-out: I initiated and conducted the "Time-out" before starting the procedure, as per protocol. The patient was asked to participate by confirming the accuracy of the "Time Out" information. Verification of the correct person, site, and procedure were performed and confirmed by me, the nursing staff, and the patient. "Time-out" conducted as per Joint Commission's Universal Protocol (UP.01.01.01). "Time-out" Date & Time: 02/05/2017; 1001 hrs.  Description of Procedure Process:   Position: Prone with head of the table was raised to  facilitate breathing. Target Area: For Cervical Facet blocks, the target is the postero-lateral waist of the articular pillars at theC4, C5, C6, & C7 levels. Approach: Posterior approach. Area Prepped: Entire Posterior Cervico-thoracic Region Prepping solution: ChloraPrep (2% chlorhexidine gluconate and 70% isopropyl alcohol) Safety Precautions: Aspiration looking for blood return was conducted prior to all injections. At no point did we inject any substances, as a  needle was being advanced. No attempts were made at seeking any paresthesias. Safe injection practices and needle disposal techniques used. Medications properly checked for expiration dates. SDV (single dose vial) medications used. Description of the Procedure: Protocol guidelines were followed. The patient was placed in position over the fluoroscopy table. The target area was identified and the area prepped in the usual manner. Skin desensitized using vapocoolant spray. Skin & deeper tissues infiltrated with local anesthetic. Appropriate amount of time allowed to pass for local anesthetics to take effect. The procedure needle was introduced through the skin, ipsilateral to the reported pain, and advanced to the target area. Bone was contacted on the posterior aspect of the articular pillars and the needle walked lateral, until the border was cleared. Lateral views taken to make sure the needle tip did not advance past the posterior third of the lateral mass of the posterior columns. The procedure was repeated in identical fashion for each level. Negative aspiration confirmed. Solution injected in intermittent fashion, asking for systemic symptoms every 0.5cc of injectate. The needles were then removed and the area cleansed, making sure to leave some of the prepping solution back to take advantage of its long term bactericidal properties. Vitals:   02/05/17 1018 02/05/17 1028 02/05/17 1038 02/05/17 1048  BP: 137/76 (!) 142/68 (!) 143/66 132/66   Pulse:      Resp: (!) 8 16 14 16   Temp:  97.6 F (36.4 C)  98.6 F (37 C)  TempSrc:      SpO2: 96% 95% 95% 98%  Weight:      Height:        Start Time: 1002 hrs. End Time: 1017 hrs. Materials:  Needle(s) Type: Regular needle Gauge: 22G Length: 3.5-in Medication(s): We administered lactated ringers, fentaNYL, iopamidol, ropivacaine (PF) 2 mg/mL (0.2%), lidocaine (PF), dexamethasone, and sodium chloride flush. Please see chart orders for dosing details. 0.75 cc of 0.2% ropivacaine injected at each level. Imaging Guidance (Spinal):  Type of Imaging Technique: Fluoroscopy Guidance (Spinal) Indication(s): Assistance in needle guidance and placement for procedures requiring needle placement in or near specific anatomical locations not easily accessible without such assistance. Exposure Time: Please see nurses notes. Contrast: None used. Fluoroscopic Guidance: I was personally present during the use of fluoroscopy. "Tunnel Vision Technique" used to obtain the best possible view of the target area. Parallax error corrected before commencing the procedure. "Direction-depth-direction" technique used to introduce the needle under continuous pulsed fluoroscopy. Once target was reached, antero-posterior, oblique, and lateral fluoroscopic projection used confirm needle placement in all planes. Images permanently stored in EMR. Interpretation: No contrast injected. I personally interpreted the imaging intraoperatively. Adequate needle placement confirmed in multiple planes. Permanent images saved into the patient's record.  Antibiotic Prophylaxis:  Indication(s): None identified Antibiotic given: None  C4 C5 C6 is Post-operative Assessment:  EBL: None Complications: No immediate post-treatment complications observed by team, or reported by patient. Note: The patient tolerated the entire procedure well. A repeat set of vitals were taken after the procedure and the patient was kept under  observation following institutional policy, for this type of procedure. Post-procedural neurological assessment was performed, showing return to baseline, prior to discharge. The patient was provided with post-procedure discharge instructions, including a section on how to identify potential problems. Should any problems arise concerning this procedure, the patient was given instructions to immediately contact , at any time, without hesitation. In any case, we plan to contact the patient by telephone for a follow-up status report regarding this interventional  procedure. Comments:  No additional relevant information.  Plan of Care  Disposition: Discharge home  Discharge Date & Time: 02/05/2017; 1050 hrs.  5 out of 5 strength bilateral upper extremity on discharge: Shoulder abduction, elbow flexion, elbow extension, thumb extension. 2 week follow-up for postprocedure.  Physician-requested Follow-up:  No Follow-up on file.  Future Appointments Date Time Provider Department Center  02/20/2017 9:45 AM Edward Jolly, MD [facility] None    Imaging Orders     DG C-Arm 1-60 Min-No Report Procedure Orders    No procedure(s) ordered today    Medications ordered for procedure: Meds ordered this encounter  Medications  . lactated ringers infusion 1,000 mL  . fentaNYL (SUBLIMAZE) injection 25-50 mcg    Make sure Narcan is available in the pyxis when using this medication. In the event of respiratory depression (RR< 8/min): Titrate NARCAN (naloxone) in increments of 0.1 to 0.2 mg IV at 2-3 minute intervals, until desired degree of reversal.  . iopamidol (ISOVUE-M) 41 % intrathecal injection 10 mL  . ropivacaine (PF) 2 mg/mL (0.2%) (NAROPIN) injection 2 mL  . lidocaine (PF) (XYLOCAINE) 1 % injection 4.5 mL  . dexamethasone (DECADRON) injection 10 mg  . sodium chloride flush (NS) 0.9 % injection 2 mL   Medications administered: We administered lactated ringers, fentaNYL, iopamidol, ropivacaine  (PF) 2 mg/mL (0.2%), lidocaine (PF), dexamethasone, and sodium chloride flush.  See the medical record for exact dosing, route, and time of administration.  New Prescriptions   No medications on file   Primary Care Physician: Lorre Munroe, NP Location: [facility] Out[facility] Facility Note by: Edward Jolly, MD Date: 02/05/2017; Time: 4:35 PM  Disclaimer:  Medicine is not an exact science. The only guarantee in medicine is that nothing is guaranteed. It is important to note that the decision to proceed with this intervention was based on the information collected from the patient. The Data and conclusions were drawn from the patient's questionnaire, the interview, and the physical examination. Because the information was provided in large part by the patient, it cannot be guaranteed that it has not been purposely or unconsciously manipulated. Every effort has been made to obtain as much relevant data as possible for this evaluation. It is important to note that the conclusions that lead to this procedure are derived in large part from the available data. Always take into account that the treatment will also be dependent on availability of resources and existing treatment guidelines, considered by other Pain Management Practitioners as being common knowledge and practice, at the time of the intervention. For Medico-Legal purposes, it is also important to point out that variation in procedural techniques and pharmacological choices are the acceptable norm. The indications, contraindications, technique, and results of the above procedure should only be interpreted and judged by a Board-Certified Interventional Pain Specialist with extensive familiarity and expertise in the same exact procedure and technique.

## 2017-02-05 NOTE — Patient Instructions (Signed)
Facet Blocks Patient Information  Description: The facets are joints in the spine between the vertebrae.  Like any joints in the body, facets can become irritated and painful.  Arthritis can also effect the facets.  By injecting steroids and local anesthetic in and around these joints, we can temporarily block the nerve supply to them.  Steroids act directly on irritated nerves and tissues to reduce selling and inflammation which often leads to decreased pain.  Facet blocks may be done anywhere along the spine from the neck to the low back depending upon the location of your pain.   After numbing the skin with local anesthetic (like Novocaine), a small needle is passed onto the facet joints under x-ray guidance.  You may experience a sensation of pressure while this is being done.  The entire block usually lasts about 15-25 minutes.   Conditions which may be treated by facet blocks:   Low back/buttock pain  Neck/shoulder pain  Certain types of headaches  Preparation for the injection:  1. Do not eat any solid food or dairy products within 8 hours of your appointment. 2. You may drink clear liquid up to 3 hours before appointment.  Clear liquids include water, black coffee, juice or soda.  No milk or cream please. 3. You may take your regular medication, including pain medications, with a sip of water before your appointment.  Diabetics should hold regular insulin (if taken separately) and take 1/2 normal NPH dose the morning of the procedure.  Carry some sugar containing items with you to your appointment. 4. A driver must accompany you and be prepared to drive you home after your procedure. 5. Bring all your current medications with you. 6. An IV may be inserted and sedation may be given at the discretion of the physician. 7. A blood pressure cuff, EKG and other monitors will often be applied during the procedure.  Some patients may need to have extra oxygen administered for a short  period. 8. You will be asked to provide medical information, including your allergies and medications, prior to the procedure.  We must know immediately if you are taking blood thinners (like Coumadin/Warfarin) or if you are allergic to IV iodine contrast (dye).  We must know if you could possible be pregnant.  Possible side-effects:   Bleeding from needle site  Infection (rare, may require surgery)  Nerve injury (rare)  Numbness & tingling (temporary)  Difficulty urinating (rare, temporary)  Spinal headache (a headache worse with upright posture)  Light-headedness (temporary)  Pain at injection site (serveral days)  Decreased blood pressure (rare, temporary)  Weakness in arm/leg (temporary)  Pressure sensation in back/neck (temporary)   Call if you experience:   Fever/chills associated with headache or increased back/neck pain  Headache worsened by an upright position  New onset, weakness or numbness of an extremity below the injection site  Hives or difficulty breathing (go to the emergency room)  Inflammation or drainage at the injection site(s)  Severe back/neck pain greater than usual  New symptoms which are concerning to you  Please note:  Although the local anesthetic injected can often make your back or neck feel good for several hours after the injection, the pain will likely return. It takes 3-7 days for steroids to work.  You may not notice any pain relief for at least one week.  If effective, we will often do a series of 2-3 injections spaced 3-6 weeks apart to maximally decrease your pain.  After the initial   series, you may be a candidate for a more permanent nerve block of the facets.  If you have any questions, please call [phone] [facility]  What are the risk, side effects and possible complications? Generally speaking, most procedures are safe.  However, with any procedure  there are risks, side effects, and the possibility of complications.  The risks and complications are dependent upon the sites that are lesioned, or the type of nerve block to be performed.  The closer the procedure is to the spine, the more serious the risks are.  Great care is taken when placing the radio frequency needles, block needles or lesioning probes, but sometimes complications can occur. 1. Infection: Any time there is an injection through the skin, there is a risk of infection.  This is why sterile conditions are used for these blocks.  There are four possible types of infection. 1. Localized skin infection. 2. Central Nervous System Infection-This can be in the form of Meningitis, which can be deadly. 3. Epidural Infections-This can be in the form of an epidural abscess, which can cause pressure inside of the spine, causing compression of the spinal cord with subsequent paralysis. This would require an emergency surgery to decompress, and there are no guarantees that the patient would recover from the paralysis. 4. Discitis-This is an infection of the intervertebral discs.  It occurs in about 1% of discography procedures.  It is difficult to treat and it may lead to surgery.        2. Pain: the needles have to go through skin and soft tissues, will cause soreness.       3. Damage to internal structures:  The nerves to be lesioned may be near blood vessels or    other nerves which can be potentially damaged.       4. Bleeding: Bleeding is more common if the patient is taking blood thinners such as  aspirin, Coumadin, Ticiid, Plavix, etc., or if he/she have some genetic predisposition  such as hemophilia. Bleeding into the spinal canal can cause compression of the spinal  cord with subsequent paralysis.  This would require an emergency surgery to  decompress and there are no guarantees that the patient would recover from the  paralysis.       5. Pneumothorax:  Puncturing of a lung is a  possibility, every time a needle is introduced in  the area of the chest or upper back.  Pneumothorax refers to free air around the  collapsed lung(s), inside of the thoracic cavity (chest cavity).  Another two possible  complications related to a similar event would include: Hemothorax and Chylothorax.   These are variations of the Pneumothorax, where instead of air around the collapsed  lung(s), you may have blood or chyle, respectively.       6. Spinal headaches: They may occur with any procedures in the area of the spine.       7. Persistent CSF (Cerebro-Spinal Fluid) leakage: This is a rare problem, but may occur  with prolonged intrathecal or epidural catheters either due to the formation of a fistulous  track or a dural tear.       8. Nerve damage: By working so close to the spinal cord, there is always a possibility of  nerve damage, which could be as serious as a permanent spinal cord injury with  paralysis.       9. Death:  Although rare, severe deadly allergic   reactions known as "Anaphylactic  reaction" can occur to any of the medications used.      10. Worsening of the symptoms:  We can always make thing worse.  What are the chances of something like this happening? Chances of any of this occuring are extremely low.  By statistics, you have more of a chance of getting killed in a motor vehicle accident: while driving to the hospital than any of the above occurring .  Nevertheless, you should be aware that they are possibilities.  In general, it is similar to taking a shower.  Everybody knows that you can slip, hit your head and get killed.  Does that mean that you should not shower again?  Nevertheless always keep in mind that statistics do not mean anything if you happen to be on the wrong side of them.  Even if a procedure has a 1 (one) in a 1,000,000 (million) chance of going wrong, it you happen to be that one..Also, keep in mind that by statistics, you have more of a chance of having  something go wrong when taking medications.  Who should not have this procedure? If you are on a blood thinning medication (e.g. Coumadin, Plavix, see list of "Blood Thinners"), or if you have an active infection going on, you should not have the procedure.  If you are taking any blood thinners, please inform your physician.  How should I prepare for this procedure?  Do not eat or drink anything at least six hours prior to the procedure.  Bring a driver with you .  It cannot be a taxi.  Come accompanied by an adult that can drive you back, and that is strong enough to help you if your legs get weak or numb from the local anesthetic.  Take all of your medicines the morning of the procedure with just enough water to swallow them.  If you have diabetes, make sure that you are scheduled to have your procedure done first thing in the morning, whenever possible.  If you have diabetes, take only half of your insulin dose and notify our nurse that you have done so as soon as you arrive at the clinic.  If you are diabetic, but only take blood sugar pills (oral hypoglycemic), then do not take them on the morning of your procedure.  You may take them after you have had the procedure.  Do not take aspirin or any aspirin-containing medications, at least eleven (11) days prior to the procedure.  They may prolong bleeding.  Wear loose fitting clothing that may be easy to take off and that you would not mind if it got stained with Betadine or blood.  Do not wear any jewelry or perfume  Remove any nail coloring.  It will interfere with some of our monitoring equipment.  NOTE: Remember that this is not meant to be interpreted as a complete list of all possible complications.  Unforeseen problems may occur.  BLOOD THINNERS The following drugs contain aspirin or other products, which can cause increased bleeding during surgery and should not be taken for 2 weeks prior to and 1 week after surgery.  If you  should need take something for relief of minor pain, you may take acetaminophen which is found in Tylenol,m Datril, Anacin-3 and Panadol. It is not blood thinner. The products listed below are.  Do not take any of the products listed below in addition to any listed on your instruction sheet.  A.P.C or A.P.C with   Codeine Codeine Phosphate Capsules #3 Ibuprofen Ridaura  ABC compound Congesprin Imuran rimadil  Advil Cope Indocin Robaxisal  Alka-Seltzer Effervescent Pain Reliever and Antacid Coricidin or Coricidin-D  Indomethacin Rufen  Alka-Seltzer plus Cold Medicine Cosprin Ketoprofen S-A-C Tablets  Anacin Analgesic Tablets or Capsules Coumadin Korlgesic Salflex  Anacin Extra Strength Analgesic tablets or capsules CP-2 Tablets Lanoril Salicylate  Anaprox Cuprimine Capsules Levenox Salocol  Anexsia-D Dalteparin Magan Salsalate  Anodynos Darvon compound Magnesium Salicylate Sine-off  Ansaid Dasin Capsules Magsal Sodium Salicylate  Anturane Depen Capsules Marnal Soma  APF Arthritis pain formula Dewitt's Pills Measurin Stanback  Argesic Dia-Gesic Meclofenamic Sulfinpyrazone  Arthritis Bayer Timed Release Aspirin Diclofenac Meclomen Sulindac  Arthritis pain formula Anacin Dicumarol Medipren Supac  Analgesic (Safety coated) Arthralgen Diffunasal Mefanamic Suprofen  Arthritis Strength Bufferin Dihydrocodeine Mepro Compound Suprol  Arthropan liquid Dopirydamole Methcarbomol with Aspirin Synalgos  ASA tablets/Enseals Disalcid Micrainin Tagament  Ascriptin Doan's Midol Talwin  Ascriptin A/D Dolene Mobidin Tanderil  Ascriptin Extra Strength Dolobid Moblgesic Ticlid  Ascriptin with Codeine Doloprin or Doloprin with Codeine Momentum Tolectin  Asperbuf Duoprin Mono-gesic Trendar  Aspergum Duradyne Motrin or Motrin IB Triminicin  Aspirin plain, buffered or enteric coated Durasal Myochrisine Trigesic  Aspirin Suppositories Easprin Nalfon Trillsate  Aspirin with Codeine Ecotrin Regular or Extra Strength  Naprosyn Uracel  Atromid-S Efficin Naproxen Ursinus  Auranofin Capsules Elmiron Neocylate Vanquish  Axotal Emagrin Norgesic Verin  Azathioprine Empirin or Empirin with Codeine Normiflo Vitamin E  Azolid Emprazil Nuprin Voltaren  Bayer Aspirin plain, buffered or children's or timed BC Tablets or powders Encaprin Orgaran Warfarin Sodium  Buff-a-Comp Enoxaparin Orudis Zorpin  Buff-a-Comp with Codeine Equegesic Os-Cal-Gesic   Buffaprin Excedrin plain, buffered or Extra Strength Oxalid   Bufferin Arthritis Strength Feldene Oxphenbutazone   Bufferin plain or Extra Strength Feldene Capsules Oxycodone with Aspirin   Bufferin with Codeine Fenoprofen Fenoprofen Pabalate or Pabalate-SF   Buffets II Flogesic Panagesic   Buffinol plain or Extra Strength Florinal or Florinal with Codeine Panwarfarin   Buf-Tabs Flurbiprofen Penicillamine   Butalbital Compound Four-way cold tablets Penicillin   Butazolidin Fragmin Pepto-Bismol   Carbenicillin Geminisyn Percodan   Carna Arthritis Reliever Geopen Persantine   Carprofen Gold's salt Persistin   Chloramphenicol Goody's Phenylbutazone   Chloromycetin Haltrain Piroxlcam   Clmetidine heparin Plaquenil   Cllnoril Hyco-pap Ponstel   Clofibrate Hydroxy chloroquine Propoxyphen         Before stopping any of these medications, be sure to consult the physician who ordered them.  Some, such as Coumadin (Warfarin) are ordered to prevent or treat serious conditions such as "deep thrombosis", "pumonary embolisms", and other heart problems.  The amount of time that you may need off of the medication may also vary with the medication and the reason for which you were taking it.  If you are taking any of these medications, please make sure you notify your pain physician before you undergo any procedures.         Pain Management Discharge Instructions  General Discharge Instructions :  If you need to reach your doctor call: Monday-Friday 8:00 am - 4:00 pm at  [phone] or toll free [phone].  After clinic hours [phone] to have operator reach doctor.  Bring all of your medication bottles to all your appointments in the pain clinic.  To cancel or reschedule your appointment with Pain Management please remember to call 24 hours in advance to avoid a fee.  Refer to the educational materials which you have been given on: General Risks, I   had my Procedure. Discharge Instructions, Post Sedation.  Post Procedure Instructions:  The drugs you were given will stay in your system until tomorrow, so for the next 24 hours you should not drive, make any legal decisions or drink any alcoholic beverages.  You may eat anything you prefer, but it is better to start with liquids then soups and crackers, and gradually work up to solid foods.  Please notify your doctor immediately if you have any unusual bleeding, trouble breathing or pain that is not related to your normal pain.  Depending on the type of procedure that was done, some parts of your body may feel week and/or numb.  This usually clears up by tonight or the next day.  Walk with the use of an assistive device or accompanied by an adult for the 24 hours.  You may use ice on the affected area for the first 24 hours.  Put ice in a Ziploc bag and cover with a towel and place against area 15 minutes on 15 minutes off.  You may switch to heat after 24 hours. 

## 2017-02-05 NOTE — Progress Notes (Signed)
Safety precautions to be maintained throughout the outpatient stay will include: orient to surroundings, keep bed in low position, maintain call bell within reach at all times, provide assistance with transfer out of bed and ambulation.  

## 2017-02-16 ENCOUNTER — Other Ambulatory Visit: Payer: Self-pay | Admitting: Internal Medicine

## 2017-02-20 ENCOUNTER — Ambulatory Visit
Payer: 59 | Attending: Student in an Organized Health Care Education/Training Program | Admitting: Student in an Organized Health Care Education/Training Program

## 2017-02-20 ENCOUNTER — Encounter: Payer: Self-pay | Admitting: Student in an Organized Health Care Education/Training Program

## 2017-02-20 VITALS — BP 150/87 | HR 77 | Temp 98.4°F | Resp 18 | Ht 67.0 in | Wt 240.0 lb

## 2017-02-20 DIAGNOSIS — G4733 Obstructive sleep apnea (adult) (pediatric): Secondary | ICD-10-CM | POA: Insufficient documentation

## 2017-02-20 DIAGNOSIS — M5412 Radiculopathy, cervical region: Secondary | ICD-10-CM

## 2017-02-20 DIAGNOSIS — G4700 Insomnia, unspecified: Secondary | ICD-10-CM | POA: Insufficient documentation

## 2017-02-20 DIAGNOSIS — K589 Irritable bowel syndrome without diarrhea: Secondary | ICD-10-CM | POA: Diagnosis not present

## 2017-02-20 DIAGNOSIS — Z888 Allergy status to other drugs, medicaments and biological substances status: Secondary | ICD-10-CM | POA: Insufficient documentation

## 2017-02-20 DIAGNOSIS — Z7982 Long term (current) use of aspirin: Secondary | ICD-10-CM | POA: Insufficient documentation

## 2017-02-20 DIAGNOSIS — Z79899 Other long term (current) drug therapy: Secondary | ICD-10-CM | POA: Diagnosis not present

## 2017-02-20 DIAGNOSIS — E785 Hyperlipidemia, unspecified: Secondary | ICD-10-CM | POA: Insufficient documentation

## 2017-02-20 DIAGNOSIS — Z823 Family history of stroke: Secondary | ICD-10-CM | POA: Insufficient documentation

## 2017-02-20 DIAGNOSIS — Z8249 Family history of ischemic heart disease and other diseases of the circulatory system: Secondary | ICD-10-CM | POA: Diagnosis not present

## 2017-02-20 DIAGNOSIS — M79602 Pain in left arm: Secondary | ICD-10-CM | POA: Insufficient documentation

## 2017-02-20 DIAGNOSIS — Z7951 Long term (current) use of inhaled steroids: Secondary | ICD-10-CM | POA: Diagnosis not present

## 2017-02-20 DIAGNOSIS — M542 Cervicalgia: Secondary | ICD-10-CM

## 2017-02-20 DIAGNOSIS — J45909 Unspecified asthma, uncomplicated: Secondary | ICD-10-CM | POA: Diagnosis not present

## 2017-02-20 DIAGNOSIS — M545 Low back pain: Secondary | ICD-10-CM | POA: Diagnosis not present

## 2017-02-20 DIAGNOSIS — M4722 Other spondylosis with radiculopathy, cervical region: Secondary | ICD-10-CM

## 2017-02-20 DIAGNOSIS — Z825 Family history of asthma and other chronic lower respiratory diseases: Secondary | ICD-10-CM | POA: Insufficient documentation

## 2017-02-20 DIAGNOSIS — K219 Gastro-esophageal reflux disease without esophagitis: Secondary | ICD-10-CM | POA: Diagnosis not present

## 2017-02-20 DIAGNOSIS — Z882 Allergy status to sulfonamides status: Secondary | ICD-10-CM | POA: Diagnosis not present

## 2017-02-20 NOTE — Progress Notes (Signed)
Safety precautions to be maintained throughout the outpatient stay will include: orient to surroundings, keep bed in low position, maintain call bell within reach at all times, provide assistance with transfer out of bed and ambulation.  

## 2017-02-20 NOTE — Patient Instructions (Addendum)
1. Repeat cervical epidural steroid injection.  Epidural Steroid Injection Patient Information  Description: The epidural space surrounds the nerves as they exit the spinal cord.  In some patients, the nerves can be compressed and inflamed by a bulging disc or a tight spinal canal (spinal stenosis).  By injecting steroids into the epidural space, we can bring irritated nerves into direct contact with a potentially helpful medication.  These steroids act directly on the irritated nerves and can reduce swelling and inflammation which often leads to decreased pain.  Epidural steroids may be injected anywhere along the spine and from the neck to the low back depending upon the location of your pain.   After numbing the skin with local anesthetic (like Novocaine), a small needle is passed into the epidural space slowly.  You may experience a sensation of pressure while this is being done.  The entire block usually last less than 10 minutes.  Conditions which may be treated by epidural steroids:   Low back and leg pain  Neck and arm pain  Spinal stenosis  Post-laminectomy syndrome  Herpes zoster (shingles) pain  Pain from compression fractures  Preparation for the injection:  1. Do not eat any solid food or dairy products within 8 hours of your appointment.  2. You may drink clear liquids up to 3 hours before appointment.  Clear liquids include water, black coffee, juice or soda.  No milk or cream please. 3. You may take your regular medication, including pain medications, with a sip of water before your appointment  Diabetics should hold regular insulin (if taken separately) and take 1/2 normal NPH dos the morning of the procedure.  Carry some sugar containing items with you to your appointment. 4. A driver must accompany you and be prepared to drive you home after your procedure.  5. Bring all your current medications with your. 6. An IV may be inserted and sedation may be given at the  discretion of the physician.   7. A blood pressure cuff, EKG and other monitors will often be applied during the procedure.  Some patients may need to have extra oxygen administered for a short period. 8. You will be asked to provide medical information, including your allergies, prior to the procedure.  We must know immediately if you are taking blood thinners (like Coumadin/Warfarin)  Or if you are allergic to IV iodine contrast (dye). We must know if you could possible be pregnant.  Possible side-effects:  Bleeding from needle site  Infection (rare, may require surgery)  Nerve injury (rare)  Numbness & tingling (temporary)  Difficulty urinating (rare, temporary)  Spinal headache ( a headache worse with upright posture)  Light -headedness (temporary)  Pain at injection site (several days)  Decreased blood pressure (temporary)  Weakness in arm/leg (temporary)  Pressure sensation in back/neck (temporary)  Call if you experience:  Fever/chills associated with headache or increased back/neck pain.  Headache worsened by an upright position.  New onset weakness or numbness of an extremity below the injection site  Hives or difficulty breathing (go to the emergency room)  Inflammation or drainage at the infection site  Severe back/neck pain  Any new symptoms which are concerning to you  Please note:  Although the local anesthetic injected can often make your back or neck feel good for several hours after the injection, the pain will likely return.  It takes 3-7 days for steroids to work in the epidural space.  You may not notice any pain relief  for at least that one week.  If effective, we will often do a series of three injections spaced 3-6 weeks apart to maximally decrease your pain.  After the initial series, we generally will wait several months before considering a repeat injection of the same type.  If you have any questions, please call [phone] [facility]

## 2017-02-20 NOTE — Progress Notes (Signed)
Patient's Name: Donna Vasquez  MRN: [id number]  Referring Provider: Jearld Fenton, NP  DOB: May 08, 1966  PCP: Jearld Fenton, NP  DOS: 02/20/2017  Note by: Gillis Santa, MD  Service setting: Ambulatory outpatient  Specialty: Interventional Pain Management  Location: [facility] (AMB) Pain Management Facility    Patient type: Established   Primary Reason(s) for Visit: Encounter for post-procedure evaluation of chronic illness with mild to moderate exacerbation CC: Neck Pain  HPI  Donna Vasquez is a 51 y.o. year old, female patient, who comes today for a post-procedure evaluation. She has IBS (irritable bowel syndrome); Gastroesophageal reflux disease without esophagitis; Allergy-induced asthma; Insomnia; HLD (hyperlipidemia); OSA (obstructive sleep apnea); Low back pain; Neck pain, acute; Torticollis; and Left arm pain on her problem list. Her primarily concern today is the Neck Pain  Pain Assessment: Location:   Neck Radiating: left elbow and wrist, with numbness in left hand Onset: More than a month ago Duration: Chronic pain Quality: Shooting Severity: 4 /10 (self-reported pain score)  Note: Reported level is compatible with observation.                   When using our objective Pain Scale, levels between 6 and 10/10 are said to belong in an emergency room, as it progressively worsens from a 6/10, described as severely limiting, requiring emergency care not usually available at an outpatient pain management facility. At a 6/10 level, communication becomes difficult and requires great effort. Assistance to reach the emergency department may be required. Facial flushing and profuse sweating along with potentially dangerous increases in heart rate and blood pressure will be evident. Effect on ADL:   Timing: Constant Modifying factors: Ibuprofen, heating pad, good posture  Donna Vasquez comes in today for post-procedure evaluation after the treatment done on 02/05/2017.  Further details on both, my  assessment(s), as well as the proposed treatment plan, please see below.  Post-Procedure Assessment  02/05/2017 Procedure: left C4, C5, C6, C7 medial branch nerve block Pre-procedure pain score:  3/10 Post-procedure pain score: 0/10         Influential Factors: BMI: 37.59 kg/m Intra-procedural challenges: None observed.         Assessment challenges: None detected.              Reported side-effects: None.        Post-procedural adverse reactions or complications: None reported         Sedation: Please see nurses note. When no sedatives are used, the analgesic levels obtained are directly associated to the effectiveness of the local anesthetics. However, when sedation is provided, the level of analgesia obtained during the initial 1 hour following the intervention, is believed to be the result of a combination of factors. These factors may include, but are not limited to: 1. The effectiveness of the local anesthetics used. 2. The effects of the analgesic(s) and/or anxiolytic(s) used. 3. The degree of discomfort experienced by the patient at the time of the procedure. 4. The patients ability and reliability in recalling and recording the events. 5. The presence and influence of possible secondary gains and/or psychosocial factors. Reported result: Relief experienced during the 1st hour after the procedure: 100 % (Ultra-Short Term Relief)            Interpretative annotation: Clinically appropriate result. Analgesia during this period is likely to be Local Anesthetic and/or IV Sedative (Analgesic/Anxiolytic) related.          Effects of local anesthetic: The analgesic effects  attained during this period are directly associated to the localized infiltration of local anesthetics and therefore cary significant diagnostic value as to the etiological location, or anatomical origin, of the pain. Expected duration of relief is directly dependent on the pharmacodynamics of the local anesthetic used.  Long-acting (4-6 hours) anesthetics used.  Reported result: Relief during the next 4 to 6 hour after the procedure: 50 % (Short-Term Relief)            Interpretative annotation: Clinically appropriate result. Analgesia during this period is likely to be Local Anesthetic-related.          Long-term benefit: Defined as the period of time past the expected duration of local anesthetics (1 hour for short-acting and 4-6 hours for long-acting). With the possible exception of prolonged sympathetic blockade from the local anesthetics, benefits during this period are typically attributed to, or associated with, other factors such as analgesic sensory neuropraxia, antiinflammatory effects, or beneficial biochemical changes provided by agents other than the local anesthetics.  Reported result: Extended relief following procedure: 50 % (Long-Term Relief)            Interpretative annotation: Clinically appropriate result. Good relief. No long-term benefit expected. Inflammation plays a part in the etiology to the pain.          Current benefits: Defined as reported results that persistent at this point in time.   Analgesia: 25-50 % Donna Vasquez reports improvement of arthralgia.she also notes improvement in left shoulder and scapular myofascial pain. Function: Somewhat improved ROM: Somewhat improved Interpretative annotation: Recurrence of symptoms. Limited therapeutic benefit. Effective diagnostic intervention.          Interpretation: Results would suggest a successful diagnostic intervention.                  Plan:  Please see "Plan of Care" for details.       Laboratory Chemistry  Inflammation Markers (CRP: Acute Phase) (ESR: Chronic Phase) No results found for: CRP, ESRSEDRATE               Renal Function Markers Lab Results  Component Value Date   BUN 17 11/13/2015   CREATININE 0.91 11/13/2015                 Hepatic Function Markers Lab Results  Component Value Date   AST 17 11/13/2015    ALT 18 11/13/2015   ALBUMIN 4.3 11/13/2015   ALKPHOS 79 11/13/2015                 Electrolytes Lab Results  Component Value Date   NA 138 11/13/2015   K 4.4 11/13/2015   CL 103 11/13/2015   CALCIUM 9.5 11/13/2015                 Neuropathy Markers Lab Results  Component Value Date   VITAMINB12 431 05/26/2014                 Bone Pathology Markers Lab Results  Component Value Date   ALKPHOS 79 11/13/2015   VD25OH 22.63 (L) 05/26/2014   CALCIUM 9.5 11/13/2015                 Coagulation Parameters Lab Results  Component Value Date   PLT 253.0 11/13/2015                 Cardiovascular Markers Lab Results  Component Value Date   HGB 13.7 11/13/2015   HCT 40.4 11/13/2015  Note: Lab results reviewed.  Recent Diagnostic Imaging Results  DG C-Arm 1-60 Min-No Report Fluoroscopy was utilized by the requesting physician.  No radiographic  interpretation.   Complexity Note: Imaging results reviewed. Results shared with Donna Vasquez, using [employer].                         Meds   Current Outpatient Prescriptions:  .  albuterol (PROAIR HFA) 108 (90 Base) MCG/ACT inhaler, Inhale 1-2 puffs into the lungs every 6 (six) hours as needed for wheezing or shortness of breath., Disp: 1 Inhaler, Rfl: 2 .  aspirin 81 MG tablet, Take 81 mg by mouth daily., Disp: , Rfl:  .  cetirizine (ZYRTEC) 10 MG tablet, Take 10 mg by mouth daily., Disp: , Rfl: 0 .  Cholecalciferol (VITAMIN D-3) 1000 UNITS CAPS, Take 2 capsules by mouth daily. 3000 units, Disp: , Rfl:  .  clonazePAM (KLONOPIN) 0.5 MG tablet, TAKE 1 TABLET EVERY 6 HOURS AS NEEDED, Disp: 30 tablet, Rfl: 0 .  cyclobenzaprine (FLEXERIL) 10 MG tablet, Take 1 tablet (10 mg total) by mouth at bedtime as needed for muscle spasms., Disp: 15 tablet, Rfl: 0 .  Fluticasone-Salmeterol (ADVAIR) 250-50 MCG/DOSE AEPB, Inhale 1 puff into the lungs daily., Disp: 60 each, Rfl: 2 .  Homeopathic Products (ZICAM COLD REMEDY  NA), Place 4 sprays into the nose., Disp: , Rfl:  .  hyoscyamine (LEVSIN, ANASPAZ) 0.125 MG tablet, Take 1 tablet (0.125 mg total) by mouth every 6 (six) hours as needed., Disp: 30 tablet, Rfl: 2 .  meloxicam (MOBIC) 7.5 MG tablet, Take 1 tablet (7.5 mg total) by mouth daily as needed for pain., Disp: 30 tablet, Rfl: 1 .  mometasone (NASONEX) 50 MCG/ACT nasal spray, Place 2 sprays into the nose daily., Disp: 17 g, Rfl: 1 .  montelukast (SINGULAIR) 10 MG tablet, TAKE 1 TABLET (10 MG TOTAL) BY MOUTH AT BEDTIME., Disp: 30 tablet, Rfl: 2 .  niacin (NIASPAN) 1000 MG CR tablet, Take 1 tablet (1,000 mg total) by mouth at bedtime. MUST SCHEDULE ANNUAL EXAM FOR December, Disp: 30 tablet, Rfl: 1 .  omeprazole (PRILOSEC) 20 MG capsule, Take 1 capsule (20 mg total) by mouth daily., Disp: 30 capsule, Rfl: 2 .  sertraline (ZOLOFT) 100 MG tablet, Take 1 tablet (100 mg total) by mouth daily., Disp: 30 tablet, Rfl: 1 .  simvastatin (ZOCOR) 20 MG tablet, Take 1 tablet (20 mg total) by mouth daily at 6 PM. MUST SCHEDULE ANNUAL EXAM FOR December, Disp: 30 tablet, Rfl: 1 .  triamcinolone cream (KENALOG) 0.1 %, Apply 1 application topically 2 (two) times daily as needed., Disp: 45 g, Rfl: 1 .  methocarbamol (ROBAXIN) 500 MG tablet, Take by mouth., Disp: , Rfl:  .  oxyCODONE-acetaminophen (PERCOCET) 7.5-325 MG tablet, Take 1 tablet by mouth every 6 (six) hours as needed for severe pain. (Patient not taking: Reported on 02/20/2017), Disp: 15 tablet, Rfl: 0 .  predniSONE (DELTASONE) 20 MG tablet, 2 tabs by mouth x 3 days, 1 tab by mouth x 3 days, 1/2 tab by mouth x 3 days (Patient not taking: Reported on 02/20/2017), Disp: 12 tablet, Rfl: 0  ROS  Constitutional: Denies any fever or chills Gastrointestinal: No reported hemesis, hematochezia, vomiting, or acute GI distress Musculoskeletal: Denies any acute onset joint swelling, redness, loss of ROM, or weakness Neurological: No reported episodes of acute onset apraxia,  aphasia, dysarthria, agnosia, amnesia, paralysis, loss of coordination, or loss of consciousness  Allergies  Donna Vasquez is allergic to flonase [fluticasone propionate] and sulfa antibiotics.  [address]  Drug: Donna Vasquez  reports that she does not use drugs. Alcohol:  reports that she drinks alcohol. Tobacco:  reports that she has never smoked. She has never used smokeless tobacco. Medical:  has a past medical history of Allergy; Asthma; Chicken pox; Frequent headaches; Hyperlipidemia; IBS (irritable bowel syndrome); Interstitial cystitis; and Urine incontinence. Surgical: Donna Vasquez  has a past surgical history that includes Cholecystectomy; Tubal ligation (1999); Nasal sinus surgery; and cyst removed from back. Family: family history includes COPD in her mother; Cancer in her maternal aunt and paternal aunt; Emphysema in her mother; Heart disease in her father and mother; Hypertension in her father; Polymyalgia rheumatica in her mother; Stroke in her maternal grandmother and mother.  Constitutional Exam  General appearance: Well nourished, well developed, and well hydrated. In no apparent acute distress Vitals:   02/20/17 1006  BP: (!) 150/87  Pulse: 77  Resp: 18  Temp: 98.4 F (36.9 C)  TempSrc: Oral  SpO2: 99%  Weight: 240 lb (108.9 kg)  Height: 5' 7"  (1.702 m)   BMI Assessment: Estimated body mass index is 37.59 kg/m as calculated from the following:   Height as of this encounter: 5' 7"  (1.702 m).   Weight as of this encounter: 240 lb (108.9 kg).  BMI interpretation table: BMI level Category Range association with higher incidence of chronic pain  <18 kg/m2 Underweight   18.5-24.9 kg/m2 Ideal body weight   25-29.9 kg/m2 Overweight Increased incidence by 20%  30-34.9 kg/m2 Obese (Class I) Increased incidence by 68%  35-39.9 kg/m2 Severe obesity (Class II) Increased incidence by 136%  >40 kg/m2 Extreme obesity (Class III) Increased incidence by 254%   BMI Readings from Last 4  Encounters:  02/20/17 37.59 kg/m  02/05/17 37.03 kg/m  01/23/17 37.03 kg/m  01/08/17 37.03 kg/m   Wt Readings from Last 4 Encounters:  02/20/17 240 lb (108.9 kg)  02/05/17 240 lb (108.9 kg)  01/23/17 240 lb (108.9 kg)  01/08/17 240 lb (108.9 kg)  Psych/Mental status: Alert, oriented x 3 (person, place, & time)       Eyes: PERLA Respiratory: No evidence of acute respiratory distress  Cervical Spine Area Exam  Skin & Axial Inspection: No masses, redness, edema, swelling, or associated skin lesions Alignment: Symmetrical Functional ROM: Unrestricted ROM      Stability: No instability detected Muscle Tone/Strength: Functionally intact. No obvious neuro-muscular anomalies detected. Sensory (Neurological): Improved Palpation: Complains of area being tender to palpation            overlying the left cervical facets, superior cervical facets.  Upper Extremity (UE) Exam    Side: Right upper extremity  Side: Left upper extremity  Skin & Extremity Inspection: Skin color, temperature, and hair growth are WNL. No peripheral edema or cyanosis. No masses, redness, swelling, asymmetry, or associated skin lesions. No contractures.  Skin & Extremity Inspection: Skin color, temperature, and hair growth are WNL. No peripheral edema or cyanosis. No masses, redness, swelling, asymmetry, or associated skin lesions. No contractures.  Functional ROM: Unrestricted ROM          Functional ROM: Unrestricted ROM          Muscle Tone/Strength: Functionally intact. No obvious neuro-muscular anomalies detected.  Muscle Tone/Strength: Functionally intact. No obvious neuro-muscular anomalies detected.  Sensory (Neurological): Unimpaired          Sensory (Neurological): Unimpaired  Palpation: No palpable anomalies              Palpation: No palpable anomalies              Specialized Test(s): Deferred         Specialized Test(s): Deferred          Thoracic Spine Area Exam  Skin & Axial Inspection: No  masses, redness, or swelling Alignment: Symmetrical Functional ROM: Unrestricted ROM Stability: No instability detected Muscle Tone/Strength: Functionally intact. No obvious neuro-muscular anomalies detected. Sensory (Neurological): Unimpaired Muscle strength & Tone: No palpable anomalies  Lumbar Spine Area Exam  Skin & Axial Inspection: No masses, redness, or swelling Alignment: Symmetrical Functional ROM: Unrestricted ROM      Stability: No instability detected Muscle Tone/Strength: Functionally intact. No obvious neuro-muscular anomalies detected. Sensory (Neurological): Unimpaired Palpation: No palpable anomalies       Provocative Tests: Lumbar Hyperextension and rotation test: evaluation deferred today       Lumbar Lateral bending test: evaluation deferred today       Patrick's Maneuver: evaluation deferred today                    Gait & Posture Assessment  Ambulation: Unassisted Gait: Relatively normal for age and body habitus Posture: WNL   Lower Extremity Exam    Side: Right lower extremity  Side: Left lower extremity  Skin & Extremity Inspection: Skin color, temperature, and hair growth are WNL. No peripheral edema or cyanosis. No masses, redness, swelling, asymmetry, or associated skin lesions. No contractures.  Skin & Extremity Inspection: Skin color, temperature, and hair growth are WNL. No peripheral edema or cyanosis. No masses, redness, swelling, asymmetry, or associated skin lesions. No contractures.  Functional ROM: Unrestricted ROM          Functional ROM: Unrestricted ROM          Muscle Tone/Strength: Functionally intact. No obvious neuro-muscular anomalies detected.  Muscle Tone/Strength: Functionally intact. No obvious neuro-muscular anomalies detected.  Sensory (Neurological): Unimpaired  Sensory (Neurological): Unimpaired  Palpation: No palpable anomalies  Palpation: No palpable anomalies   Assessment  Primary Diagnosis & Pertinent Problem List: The  primary encounter diagnosis was Cervical radiculopathy. Diagnoses of Neck pain and Cervical spondylosis with radiculopathy were also pertinent to this visit.  Status Diagnosis  Persistent Responding Responding 1. Cervical radiculopathy   2. Neck pain   3. Cervical spondylosis with radiculopathy      51 year old female with neck and left arm pain, that started after sleeping in an unusual position, with cervical MRI showing Large left-sided disc protrusion in the foraminal entry zone at C6-7 impinges on the left C7 root as it enters the foramen and slightly deflects the cord to the right.  Patient is status post C7-T1, left-sided, cervical epidural steroid injection on 01/08/2017. Patient states that this has helped with her neck pain and her left upper extremity pain. She states that she has "spotty" areas that are still occasionally painful and numb but not to the extent that it was before her cervical epidural.  Patient is status post left C4-C7 facet medial branch nerve block on 02/05/2017. She returns for follow-up. She states that her focal pain overlying her left neck, shoulder, left periscapular region has significantly improved. She notes that up until today she has noted significant pain improvement as result of her cervical epidural in her left lumbar facet block. She continues to note intermittent numbness and tingling in her left arm consistent with  the C7 C8 dermatome.This is exacerbated with certain movements specifically right neck rotation and extension.  Patient states that the cervical epidural steroid injection was beneficial for her cervical radicular symptoms and would like to repeat this. I think this is reasonable. We will plan for left C7-T1 epidural steroid injection under fluoroscopy with sedation.  Plan: -Repeat left C7-T1 epidural steroid injection under fluoroscopy. Cervical ESI #2.   Plan of Care  Pharmacotherapy (Medications Ordered): No orders of the defined  types were placed in this encounter.  Lab-work, procedure(s), and/or referral(s): Orders Placed This Encounter  Procedures  . Cervical Epidural Injection    Interventional management options: Planned, scheduled, and/or pending:    -repeat Left cervical facet blocks to target C4, C5, C6, C7 medial branch nerves (#1 on 02/05/2017) -cervical epidural steroid injections  #2 (#1- 8/22 >60% improvement in radicular pain)     Provider-requested follow-up: Return in about 6 days (around 02/26/2017) for Procedure.  No future appointments.  Primary Care Physician: Jearld Fenton, NP Location: [facility] Note by: Gillis Santa, M.D Date: 02/20/2017; Time: 10:50 AM  Patient Instructions  1. Repeat cervical epidural steroid injection.  Epidural Steroid Injection Patient Information  Description: The epidural space surrounds the nerves as they exit the spinal cord.  In some patients, the nerves can be compressed and inflamed by a bulging disc or a tight spinal canal (spinal stenosis).  By injecting steroids into the epidural space, we can bring irritated nerves into direct contact with a potentially helpful medication.  These steroids act directly on the irritated nerves and can reduce swelling and inflammation which often leads to decreased pain.  Epidural steroids may be injected anywhere along the spine and from the neck to the low back depending upon the location of your pain.   After numbing the skin with local anesthetic (like Novocaine), a small needle is passed into the epidural space slowly.  You may experience a sensation of pressure while this is being done.  The entire block usually last less than 10 minutes.  Conditions which may be treated by epidural steroids:   Low back and leg pain  Neck and arm pain  Spinal stenosis  Post-laminectomy syndrome  Herpes zoster (shingles) pain  Pain from compression fractures  Preparation for the  injection:  1. Do not eat any solid food or dairy products within 8 hours of your appointment.  2. You may drink clear liquids up to 3 hours before appointment.  Clear liquids include water, black coffee, juice or soda.  No milk or cream please. 3. You may take your regular medication, including pain medications, with a sip of water before your appointment  Diabetics should hold regular insulin (if taken separately) and take 1/2 normal NPH dos the morning of the procedure.  Carry some sugar containing items with you to your appointment. 4. A driver must accompany you and be prepared to drive you home after your procedure.  5. Bring all your current medications with your. 6. An IV may be inserted and sedation may be given at the discretion of the physician.   7. A blood pressure cuff, EKG and other monitors will often be applied during the procedure.  Some patients may need to have extra oxygen administered for a short period. 8. You will be asked to provide medical information, including your allergies, prior to the procedure.  We must know immediately if you are taking blood thinners (like Coumadin/Warfarin)  Or if you are  allergic to IV iodine contrast (dye). We must know if you could possible be pregnant.  Possible side-effects:  Bleeding from needle site  Infection (rare, may require surgery)  Nerve injury (rare)  Numbness & tingling (temporary)  Difficulty urinating (rare, temporary)  Spinal headache ( a headache worse with upright posture)  Light -headedness (temporary)  Pain at injection site (several days)  Decreased blood pressure (temporary)  Weakness in arm/leg (temporary)  Pressure sensation in back/neck (temporary)  Call if you experience:  Fever/chills associated with headache or increased back/neck pain.  Headache worsened by an upright position.  New onset weakness or numbness of an extremity below the injection site  Hives or difficulty breathing (go to the  emergency room)  Inflammation or drainage at the infection site  Severe back/neck pain  Any new symptoms which are concerning to you  Please note:  Although the local anesthetic injected can often make your back or neck feel good for several hours after the injection, the pain will likely return.  It takes 3-7 days for steroids to work in the epidural space.  You may not notice any pain relief for at least that one week.  If effective, we will often do a series of three injections spaced 3-6 weeks apart to maximally decrease your pain.  After the initial series, we generally will wait several months before considering a repeat injection of the same type.  If you have any questions, please call [phone] [facility]

## 2017-02-25 NOTE — Telephone Encounter (Signed)
Pt said [facility] did not get response to refill request for simvastatin and niacin; spoke to [place] at [facility] and they did get refills on 02/18/17 and will get refills ready for pick up. Pt voiced understanding and will cb for appt.

## 2017-03-14 ENCOUNTER — Telehealth: Payer: Self-pay | Admitting: Internal Medicine

## 2017-03-14 MED ORDER — SERTRALINE HCL 100 MG PO TABS: 100.0000 mg | ORAL_TABLET | Freq: Every day | ORAL | 0 refills | Status: DC

## 2017-03-14 MED ORDER — SIMVASTATIN 20 MG PO TABS: 20.0000 mg | ORAL_TABLET | Freq: Every day | ORAL | 0 refills | Status: DC

## 2017-03-14 MED ORDER — NIACIN ER (ANTIHYPERLIPIDEMIC) 1000 MG PO TBCR: 1000.0000 mg | EXTENDED_RELEASE_TABLET | Freq: Every day | ORAL | 0 refills | Status: DC

## 2017-03-14 NOTE — Telephone Encounter (Signed)
Rx sent through [facility] to mail order  

## 2017-03-14 NOTE — Telephone Encounter (Signed)
Copied from CRM #[id number]. Topic: Quick Communication - See Telephone Encounter >> Mar 14, 2017 10:21 AM Diana Eves B wrote: CRM for notification. See Telephone encounter for:  03/14/17. Alex with [facility] called about sertraline 100mg , simvastatin 20mg  and niacin 100mg  would like to speak to [facility] nurse  Phone [phone] Fax [phone] Reference number [facility]

## 2017-03-18 MED ORDER — SIMVASTATIN 20 MG PO TABS
20.0000 mg | ORAL_TABLET | Freq: Every day | ORAL | 0 refills | Status: DC
Start: 2017-03-18 — End: 2017-05-22

## 2017-03-18 MED ORDER — NIACIN ER (ANTIHYPERLIPIDEMIC) 1000 MG PO TBCR: 1000.0000 mg | EXTENDED_RELEASE_TABLET | Freq: Every day | ORAL | 0 refills | Status: DC

## 2017-03-18 MED ORDER — SERTRALINE HCL 100 MG PO TABS: 100.0000 mg | ORAL_TABLET | Freq: Every day | ORAL | 0 refills | Status: DC

## 2017-03-18 NOTE — Addendum Note (Signed)
Addended by: Roena Malady on: 03/18/2017 01:28 PM   Modules accepted: Orders

## 2017-04-10 ENCOUNTER — Other Ambulatory Visit: Payer: Self-pay | Admitting: Internal Medicine

## 2017-05-22 ENCOUNTER — Other Ambulatory Visit: Payer: Self-pay

## 2017-05-22 MED ORDER — NIACIN ER (ANTIHYPERLIPIDEMIC) 1000 MG PO TBCR: 1000.0000 mg | EXTENDED_RELEASE_TABLET | Freq: Every day | ORAL | 0 refills | Status: DC

## 2017-05-22 MED ORDER — SIMVASTATIN 20 MG PO TABS: 20.0000 mg | ORAL_TABLET | Freq: Every day | ORAL | 0 refills | Status: DC

## 2017-05-28 ENCOUNTER — Other Ambulatory Visit: Payer: Self-pay

## 2017-05-28 MED ORDER — SIMVASTATIN 20 MG PO TABS: 20.0000 mg | ORAL_TABLET | Freq: Every day | ORAL | 0 refills | Status: DC

## 2017-06-05 ENCOUNTER — Telehealth: Payer: Self-pay

## 2017-06-05 NOTE — Telephone Encounter (Signed)
LMOVM for pt to RTC to sched appt with Dr. Aron/thx dmf   Copied from CRM [id number]. Topic: Appointment Scheduling - Scheduling Inquiry for Clinic >> Jun 04, 2017  2:22 PM Donna Vasquez, NT wrote: Reason for CRM: patient is calling and would like to switch PCP with Dr. Dayton Martes. She currently sees [facility] Her parents PCP is Dr. Dayton Martes and she said it would be easier for her if she switched over as well. Please contact patient if this is approved so she can schedule an appt.

## 2017-07-10 ENCOUNTER — Other Ambulatory Visit: Payer: Self-pay | Admitting: Family Medicine

## 2017-07-10 MED ORDER — OSELTAMIVIR PHOSPHATE 75 MG PO CAPS: 75.0000 mg | ORAL_CAPSULE | Freq: Every day | ORAL | 0 refills | Status: DC

## 2017-07-10 NOTE — Progress Notes (Signed)
Patient's sister in office.  States that patient's daughter and husband both tested positive for influenza A.  [facility]Rx sent for Tamiflu 75 mg daily x 10 days.

## 2017-09-03 ENCOUNTER — Other Ambulatory Visit: Payer: Self-pay | Admitting: Internal Medicine

## 2017-09-04 NOTE — Telephone Encounter (Signed)
Pt called in to follow up on request. Pt says that she will be running out of her medication before her scheduled transition of care apt with Dayton Martes. Pt would like to know if either provider would fill until her apt?    CB: [phone] (desk)    Please assist further.

## 2017-09-04 NOTE — Telephone Encounter (Signed)
RB-Plz see refill req/thx dmf 

## 2017-09-15 ENCOUNTER — Encounter: Payer: Self-pay | Admitting: Family Medicine

## 2017-09-15 ENCOUNTER — Other Ambulatory Visit: Payer: Self-pay | Admitting: Family Medicine

## 2017-09-15 ENCOUNTER — Ambulatory Visit: Payer: BLUE CROSS/BLUE SHIELD | Admitting: Family Medicine

## 2017-09-15 VITALS — BP 136/86 | HR 78 | Temp 98.7°F | Ht 67.0 in | Wt 253.8 lb

## 2017-09-15 DIAGNOSIS — F339 Major depressive disorder, recurrent, unspecified: Secondary | ICD-10-CM

## 2017-09-15 DIAGNOSIS — R600 Localized edema: Secondary | ICD-10-CM | POA: Diagnosis not present

## 2017-09-15 DIAGNOSIS — E669 Obesity, unspecified: Secondary | ICD-10-CM

## 2017-09-15 DIAGNOSIS — Z1239 Encounter for other screening for malignant neoplasm of breast: Secondary | ICD-10-CM

## 2017-09-15 DIAGNOSIS — Z1231 Encounter for screening mammogram for malignant neoplasm of breast: Secondary | ICD-10-CM

## 2017-09-15 MED ORDER — BUPROPION HCL ER (XL) 150 MG PO TB24
150.0000 mg | ORAL_TABLET | Freq: Every day | ORAL | 3 refills | Status: DC
Start: 2017-09-15 — End: 2017-09-19

## 2017-09-15 NOTE — Patient Instructions (Addendum)
Great to see you.  We are starting wellbutrin 150 mg XL daily. Please update me in a few weeks.  Please call [facility] to schedule your mammogram.  Call Dr. Francena Hanly office to schedule an appointment.  Please make an appointment for  Physical with me in 1 month.

## 2017-09-15 NOTE — Assessment & Plan Note (Signed)
Given information for Dr. Dalbert Garnet- she will call her office to schedule an appointment.

## 2017-09-15 NOTE — Assessment & Plan Note (Signed)
Deteriorated. >25 minutes spent in face to face time with patient, >50% spent in counselling or coordination of care PHQ 9 is 21 today. Declining psychotherapy.  Saw Dr. Laymond Purser for awhile and felt she did not benefit from therapy. Will add Wellbutrin 150 mg XL daily to her current dose of zoloft. Follow up in 1 month. The patient indicates understanding of these issues and agrees with the plan.

## 2017-09-15 NOTE — Assessment & Plan Note (Signed)
No edema on exam today. Given note for her employer for a standing desk.

## 2017-09-15 NOTE — Progress Notes (Signed)
Subjective:   Patient ID: Donna Vasquez, female    DOB: 12-30-1965, 52 y.o.   MRN: [id number]  Donna Vasquez is a pleasant 52 y.o. year old female who presents to clinic today with Transfer of Care (Patient is here today to transfer care from Donna Reaper, NP to Donna Mannan, MD.  Dr. Mechele Vasquez @ [facility] completed a Colonoscopy 2 or 3-years-ago and states that it was normal.  She is due for a Mammogram and would like to have referral for [facility] at [facility] and Friday afternoons after 12pm is best for her.); Rash (She is C/O a rash that is on several places on her legs only.  It will itch and she will dig at it and then it will be for a day then the itching returns. Aveno lotion helps but does not make it go away..  She also has acne on her face which is unsure if it is related.); Leg Swelling (She states that she is having issues with BLE edema.  She sits at a desk all day and although she has a foot stool it is insufficient.  She is needing a note of some sort stating the need for an adjustable desk for her work to comply.); and Depression (She feels that the Sertraline 100mg  daily no longer works as it should.)  on 09/15/2017  HPI:  Depression- Currently taking Zoloft 100 mg daily.  Has been on it for 25 years at least. Feels it is no longer working.  Has been under more stress with her elderly parents, marital stressors and she feels she has no control over her weight which is adding to her depression. Tearful, experiencing anhedonia. Sleeping okay. No SI or HI.  Asking for a standing desk.  Has been having more bilateral leg swelling at the end of the day. No SOB or CP.  Current Outpatient Medications on File Prior to Visit  Medication Sig Dispense Refill  . albuterol (PROAIR HFA) 108 (90 Base) MCG/ACT inhaler Inhale 1-2 puffs into the lungs every 6 (six) hours as needed for wheezing or shortness of breath. 1 Inhaler 2  . aspirin 81 MG tablet Take 81 mg by mouth daily.      . cetirizine (ZYRTEC) 10 MG tablet Take 10 mg by mouth daily.  0  . Cholecalciferol (VITAMIN D-3) 1000 UNITS CAPS Take 2 capsules by mouth daily. 3000 units    . Fluticasone-Salmeterol (ADVAIR) 250-50 MCG/DOSE AEPB Inhale 1 puff into the lungs daily. 60 each 2  . hyoscyamine (LEVSIN, ANASPAZ) 0.125 MG tablet Take 1 tablet (0.125 mg total) by mouth every 6 (six) hours as needed. 30 tablet 2  . montelukast (SINGULAIR) 10 MG tablet TAKE 1 TABLET (10 MG TOTAL) BY MOUTH AT BEDTIME. 30 tablet 2  . niacin (NIASPAN) 1000 MG CR tablet Take 1 tablet (1,000 mg total) by mouth at bedtime. MUST SCHEDULE ANNUAL EXAM FOR REFILLS 90 tablet 0  . omeprazole (PRILOSEC) 20 MG capsule Take 1 capsule (20 mg total) by mouth daily. 30 capsule 2  . sertraline (ZOLOFT) 100 MG tablet Take 1 tablet (100 mg total) by mouth daily. MUST SCHEDULE ANNUAL PHYSICAL FOR December 90 tablet 0  . simvastatin (ZOCOR) 20 MG tablet Take 1 tablet (20 mg total) by mouth daily at 6 PM. MUST SCHEDULE ANNUAL EXAM FOR REFILLS 90 tablet 0  . triamcinolone cream (KENALOG) 0.1 % Apply 1 application topically 2 (two) times daily as needed. 45 g 1  . clonazePAM (KLONOPIN) 0.5  MG tablet TAKE 1 TABLET EVERY 6 HOURS AS NEEDED (Patient not taking: Reported on 09/15/2017) 30 tablet 0   No current facility-administered medications on file prior to visit.     Allergies  Allergen Reactions  . Flonase [Fluticasone Propionate] Other (See Comments)    Nasal irritation, burning, but can tolerate advair inhaler.    . Sulfa Antibiotics Hives    Past Medical History:  Diagnosis Date  . Allergy   . Asthma   . Chicken pox   . Frequent headaches   . Hyperlipidemia   . IBS (irritable bowel syndrome)   . Interstitial cystitis   . Urine incontinence     Past Surgical History:  Procedure Laterality Date  . CHOLECYSTECTOMY    . cyst removed from back    . NASAL SINUS SURGERY    . TUBAL LIGATION  1999    Family History  Problem Relation Age of  Onset  . COPD Mother   . Heart disease Mother   . Polymyalgia rheumatica Mother   . Emphysema Mother   . Stroke Mother   . Heart disease Father   . Hypertension Father   . Cancer Paternal Aunt        Breast  . Stroke Maternal Grandmother   . Cancer Maternal Aunt     Social History   Socioeconomic History  . Marital status: Married    Spouse name: Not on file  . Number of children: y  . Years of education: Not on file  . Highest education level: Not on file  Occupational History  . Occupation: Teacher, music  Social Needs  . Financial resource strain: Not on file  . Food insecurity:    Worry: Not on file    Inability: Not on file  . Transportation needs:    Medical: Not on file    Non-medical: Not on file  Tobacco Use  . Smoking status: Never Smoker  . Smokeless tobacco: Never Used  Substance and Sexual Activity  . Alcohol use: Yes    Alcohol/week: 0.0 oz    Comment: rare--wine  . Drug use: No  . Sexual activity: Not on file  Lifestyle  . Physical activity:    Days per week: Not on file    Minutes per session: Not on file  . Stress: Not on file  Relationships  . Social connections:    Talks on phone: Not on file    Gets together: Not on file    Attends religious service: Not on file    Active member of club or organization: Not on file    Attends meetings of clubs or organizations: Not on file    Relationship status: Not on file  . Intimate partner violence:    Fear of current or ex partner: Not on file    Emotionally abused: Not on file    Physically abused: Not on file    Forced sexual activity: Not on file  Other Topics Concern  . Not on file  Social History Narrative  . Not on file   The PMH, PSH, Social History, Family History, Medications, and allergies have been reviewed in [facility], and have been updated if relevant.   Review of Systems  Constitutional: Negative.   HENT: Negative.   Eyes: Negative.   Respiratory: Negative.   Cardiovascular:  Positive for leg swelling. Negative for chest pain and palpitations.  Gastrointestinal: Negative.   Endocrine: Negative.  Negative for cold intolerance.  Genitourinary: Negative.   Musculoskeletal: Negative.  Allergic/Immunologic: Negative.   Neurological: Negative.   Psychiatric/Behavioral: Positive for decreased concentration, dysphoric mood and sleep disturbance. Negative for agitation, behavioral problems, confusion, hallucinations, self-injury and suicidal ideas. The patient is nervous/anxious. The patient is not hyperactive.   All other systems reviewed and are negative.      Objective:    BP 136/86 (BP Location: Left Arm, Patient Position: Sitting, Cuff Size: Normal)   Pulse 78   Temp 98.7 F (37.1 C) (Oral)   Ht 5\' 7"  (1.702 m)   Wt 253 lb 12.8 oz (115.1 kg)   LMP 09/07/2017   SpO2 98%   BMI 39.75 kg/m    Physical Exam  Constitutional: She is oriented to person, place, and time. She appears well-developed and well-nourished. No distress.  HENT:  Head: Normocephalic and atraumatic.  Eyes: Pupils are equal, round, and reactive to light.  Cardiovascular: Normal rate.  Pulmonary/Chest: Effort normal.  Musculoskeletal: Normal range of motion. She exhibits no edema.  Neurological: She is alert and oriented to person, place, and time. No cranial nerve deficit.  Skin: Skin is warm. She is not diaphoretic.  Psychiatric: She has a normal mood and affect. Her behavior is normal. Judgment and thought content normal.  Nursing note and vitals reviewed.         Assessment & Plan:   Screening for breast cancer - Plan: [facility] Digital Diagnostic Bilat  Bilateral edema of lower extremity  Depression, recurrent (HCC) No follow-ups on file.

## 2017-09-19 ENCOUNTER — Other Ambulatory Visit: Payer: Self-pay

## 2017-09-19 ENCOUNTER — Ambulatory Visit: Payer: BLUE CROSS/BLUE SHIELD | Admitting: Family Medicine

## 2017-09-19 ENCOUNTER — Encounter: Payer: Self-pay | Admitting: Family Medicine

## 2017-09-19 VITALS — BP 138/72 | HR 78 | Temp 98.2°F | Ht 67.0 in | Wt 271.0 lb

## 2017-09-19 DIAGNOSIS — M542 Cervicalgia: Secondary | ICD-10-CM

## 2017-09-19 DIAGNOSIS — R42 Dizziness and giddiness: Secondary | ICD-10-CM | POA: Insufficient documentation

## 2017-09-19 MED ORDER — SIMVASTATIN 20 MG PO TABS: 20.0000 mg | ORAL_TABLET | Freq: Every day | ORAL | 1 refills | Status: DC

## 2017-09-19 MED ORDER — DICLOFENAC SODIUM 2 % TD SOLN: 1.0000 "application " | Freq: Two times a day (BID) | TRANSDERMAL | 3 refills | Status: DC

## 2017-09-19 MED ORDER — NIACIN ER (ANTIHYPERLIPIDEMIC) 1000 MG PO TBCR: 1000.0000 mg | EXTENDED_RELEASE_TABLET | Freq: Every day | ORAL | 1 refills | Status: DC

## 2017-09-19 MED ORDER — SERTRALINE HCL 100 MG PO TABS: 100.0000 mg | ORAL_TABLET | Freq: Every day | ORAL | 1 refills | Status: DC

## 2017-09-19 MED ORDER — BUPROPION HCL ER (XL) 150 MG PO TB24: 150.0000 mg | ORAL_TABLET | Freq: Every day | ORAL | 1 refills | Status: DC

## 2017-09-19 NOTE — Assessment & Plan Note (Signed)
No radiculopathy today. Pain localized over C7 midline base of neck. Likely related to her work environment.  - pennsaid - counseled on HEP  - try to change position of monitor at work.

## 2017-09-19 NOTE — Progress Notes (Signed)
Rx's to [facility] per pt req/thx dmf

## 2017-09-19 NOTE — Patient Instructions (Signed)
Please try the Epley maneuver for your dizziness. Please let me know if your symptoms don't improve  Please try the pennsaid on your neck.  Please try the exercises and try to do these through the course of the day.  Please try to put your monitor up above your eyeline.    How to Perform the Epley Maneuver The Epley maneuver is an exercise that relieves symptoms of vertigo. Vertigo is the feeling that you or your surroundings are moving when they are not. When you feel vertigo, you may feel like the room is spinning and have trouble walking. Dizziness is a little different than vertigo. When you are dizzy, you may feel unsteady or light-headed. You can do this maneuver at home whenever you have symptoms of vertigo. You can do it up to 3 times a day until your symptoms go away. Even though the Epley maneuver may relieve your vertigo for a few weeks, it is possible that your symptoms will return. This maneuver relieves vertigo, but it does not relieve dizziness. What are the risks? If it is done correctly, the Epley maneuver is considered safe. Sometimes it can lead to dizziness or nausea that goes away after a short time. If you develop other symptoms, such as changes in vision, weakness, or numbness, stop doing the maneuver and call your health care provider. How to perform the Epley maneuver 1. Sit on the edge of a bed or table with your back straight and your legs extended or hanging over the edge of the bed or table. 2. Turn your head halfway toward the affected ear or side. 3. Lie backward quickly with your head turned until you are lying flat on your back. You may want to position a pillow under your shoulders. 4. Hold this position for 30 seconds. You may experience an attack of vertigo. This is normal. 5. Turn your head to the opposite direction until your unaffected ear is facing the floor. 6. Hold this position for 30 seconds. You may experience an attack of vertigo. This is normal. Hold  this position until the vertigo stops. 7. Turn your whole body to the same side as your head. Hold for another 30 seconds. 8. Sit back up. You can repeat this exercise up to 3 times a day. Follow these instructions at home:  After doing the Epley maneuver, you can return to your normal activities.  Ask your health care provider if there is anything you should do at home to prevent vertigo. He or she may recommend that you: ? Keep your head raised (elevated) with two or more pillows while you sleep. ? Do not sleep on the side of your affected ear. ? Get up slowly from bed. ? Avoid sudden movements during the day. ? Avoid extreme head movement, like looking up or bending over. Contact a health care provider if:  Your vertigo gets worse.  You have other symptoms, including: ? Nausea. ? Vomiting. ? Headache. Get help right away if:  You have vision changes.  You have a severe or worsening headache or neck pain.  You cannot stop vomiting.  You have new numbness or weakness in any part of your body. Summary  Vertigo is the feeling that you or your surroundings are moving when they are not.  The Epley maneuver is an exercise that relieves symptoms of vertigo.  If the Epley maneuver is done correctly, it is considered safe. You can do it up to 3 times a day. This information  is not intended to replace advice given to you by your health care provider. Make sure you discuss any questions you have with your health care provider. Document Released: 05/11/2013 Document Revised: 03/26/2016 Document Reviewed: 03/26/2016 Elsevier Interactive Patient Education  2017 [employer].

## 2017-09-19 NOTE — Progress Notes (Signed)
Donna Vasquez - 52 y.o. female MRN [id number]  Date of birth: 1966-02-26  SUBJECTIVE:  Including CC & ROS.  Chief Complaint  Patient presents with  . Neck Pain    Donna Vasquez is a 52 y.o. female that is presenting with neck pain. This is a different pain than the pain that is has had previously. She has suffered from cervical radiculopathy previously. Pain located at the base of the midline cervical neck. Pain is described as a constant dull ache. Denies tingling or numbness.She has received previous epidural injections and facets blocks with some improvement. Denies injury or surgeries. Denies decrease range of motion or pain while turning her head. No radiculopathy symptoms today. She works at a desk most all day.   Has the sensation of the room spinning when she is rolling over in bed. This is intermittent in nature. The symptoms started before the pain in her neck. No new medications or infections. Has started Wellbutrin but symptoms were occurring before that. No trauma to the head.    Review of Systems  Constitutional: Negative for fever.  HENT: Negative for congestion.   Respiratory: Negative for cough.   Cardiovascular: Negative for chest pain.  Gastrointestinal: Negative for abdominal pain.  Musculoskeletal: Positive for neck pain.  Skin: Negative for color change.  Neurological: Positive for dizziness.  Hematological: Negative for adenopathy.  Psychiatric/Behavioral: Negative for agitation.    HISTORY: Past Medical, Surgical, Social, and Family History Reviewed & Updated per EMR.   Pertinent Historical Findings include:  Past Medical History:  Diagnosis Date  . Allergy   . Asthma   . Chicken pox   . Frequent headaches   . Hyperlipidemia   . IBS (irritable bowel syndrome)   . Interstitial cystitis   . Urine incontinence     Past Surgical History:  Procedure Laterality Date  . CHOLECYSTECTOMY    . cyst removed from back    . NASAL SINUS SURGERY    .  TUBAL LIGATION  1999    Allergies  Allergen Reactions  . Flonase [Fluticasone Propionate] Other (See Comments)    Nasal irritation, burning, but can tolerate advair inhaler.    . Sulfa Antibiotics Hives    Family History  Problem Relation Age of Onset  . COPD Mother   . Heart disease Mother   . Polymyalgia rheumatica Mother   . Emphysema Mother   . Stroke Mother   . Heart disease Father   . Hypertension Father   . Cancer Paternal Aunt        Breast  . Stroke Maternal Grandmother   . Cancer Maternal Aunt      Social History   Socioeconomic History  . Marital status: Married    Spouse name: Not on file  . Number of children: y  . Years of education: Not on file  . Highest education level: Not on file  Occupational History  . Occupation: Teacher, music  Social Needs  . Financial resource strain: Not on file  . Food insecurity:    Worry: Not on file    Inability: Not on file  . Transportation needs:    Medical: Not on file    Non-medical: Not on file  Tobacco Use  . Smoking status: Never Smoker  . Smokeless tobacco: Never Used  Substance and Sexual Activity  . Alcohol use: Yes    Alcohol/week: 0.0 oz    Comment: rare--wine  . Drug use: No  . Sexual activity: Not on file  Lifestyle  . Physical activity:    Days per week: Not on file    Minutes per session: Not on file  . Stress: Not on file  Relationships  . Social connections:    Talks on phone: Not on file    Gets together: Not on file    Attends religious service: Not on file    Active member of club or organization: Not on file    Attends meetings of clubs or organizations: Not on file    Relationship status: Not on file  . Intimate partner violence:    Fear of current or ex partner: Not on file    Emotionally abused: Not on file    Physically abused: Not on file    Forced sexual activity: Not on file  Other Topics Concern  . Not on file  Social History Narrative  . Not on file      PHYSICAL EXAM:  VS: BP 138/72 (BP Location: Left Arm, Patient Position: Sitting, Cuff Size: Normal)   Pulse 78   Temp 98.2 F (36.8 C) (Oral)   Ht 5\' 7"  (1.702 m)   Wt 271 lb (122.9 kg)   LMP 09/07/2017   SpO2 92%   BMI 42.44 kg/m  Physical Exam Gen: NAD, alert, cooperative with exam, well-appearing ENT: normal lips, normal nasal mucosa,  Eye: normal EOM, normal conjunctiva and lids,  CV:  no edema, +2 pedal pulses   Resp: no accessory muscle use, non-labored,  Skin: no rashes, no areas of induration  Neuro: normal tone, normal sensation to touch Psych:  normal insight, alert and oriented MSK:  Neck: Tenderness palpation over the C7 midline spine. No tenderness to palpation over the paraspinal muscles. Normal range of motion. Normal strength resistance with shrug. Normal shoulder range of motion. Normal grip strength. Neurovascularly intact     ASSESSMENT & PLAN:   Vertigo Appears to be positional. No recent infection. Started Wellbutrin but symptoms were prior to medication change.  - counseled on Epley  - if no improvement consider antivert or PT   Neck pain No radiculopathy today. Pain localized over C7 midline base of neck. Likely related to her work environment.  - pennsaid - counseled on HEP  - try to change position of monitor at work.

## 2017-09-19 NOTE — Assessment & Plan Note (Signed)
Appears to be positional. No recent infection. Started Wellbutrin but symptoms were prior to medication change.  - counseled on Epley  - if no improvement consider antivert or PT

## 2017-10-03 ENCOUNTER — Ambulatory Visit
Admission: RE | Admit: 2017-10-03 | Discharge: 2017-10-03 | Disposition: A | Discharge status: Home / Self Care | Payer: BLUE CROSS/BLUE SHIELD | Source: Ambulatory Visit | Attending: Family Medicine | Admitting: Family Medicine

## 2017-10-03 ENCOUNTER — Other Ambulatory Visit: Payer: Self-pay | Admitting: Family Medicine

## 2017-10-03 ENCOUNTER — Other Ambulatory Visit: Payer: Self-pay | Admitting: Internal Medicine

## 2017-10-03 DIAGNOSIS — Z1231 Encounter for screening mammogram for malignant neoplasm of breast: Secondary | ICD-10-CM | POA: Insufficient documentation

## 2017-10-03 DIAGNOSIS — Z1239 Encounter for other screening for malignant neoplasm of breast: Secondary | ICD-10-CM

## 2017-10-03 NOTE — Telephone Encounter (Signed)
Donna Vasquez pt

## 2017-10-16 ENCOUNTER — Encounter: Payer: Self-pay | Admitting: Family Medicine

## 2017-10-16 ENCOUNTER — Ambulatory Visit: Payer: BLUE CROSS/BLUE SHIELD | Admitting: Family Medicine

## 2017-10-16 ENCOUNTER — Other Ambulatory Visit (HOSPITAL_COMMUNITY)
Admission: RE | Admit: 2017-10-16 | Discharge: 2017-10-16 | Disposition: A | Discharge status: Home / Self Care | Payer: BLUE CROSS/BLUE SHIELD | Source: Ambulatory Visit | Attending: Family Medicine | Admitting: Family Medicine

## 2017-10-16 VITALS — BP 128/82 | HR 89 | Temp 98.4°F | Ht 67.75 in | Wt 253.2 lb

## 2017-10-16 DIAGNOSIS — Z0000 Encounter for general adult medical examination without abnormal findings: Secondary | ICD-10-CM

## 2017-10-16 DIAGNOSIS — Z01419 Encounter for gynecological examination (general) (routine) without abnormal findings: Secondary | ICD-10-CM | POA: Diagnosis not present

## 2017-10-16 DIAGNOSIS — F339 Major depressive disorder, recurrent, unspecified: Secondary | ICD-10-CM | POA: Diagnosis not present

## 2017-10-16 DIAGNOSIS — E785 Hyperlipidemia, unspecified: Secondary | ICD-10-CM

## 2017-10-16 MED ORDER — BUPROPION HCL ER (XL) 300 MG PO TB24: 300.0000 mg | ORAL_TABLET | Freq: Every day | ORAL | 3 refills | Status: DC

## 2017-10-16 NOTE — Assessment & Plan Note (Signed)
Reviewed preventive care protocols, scheduled due services, and updated immunizations Discussed nutrition, exercise, diet, and healthy lifestyle.  Orders Placed This Encounter  Procedures  . CBC with Differential/Platelet  . Comprehensive metabolic panel  . Lipid panel  . TSH   Pap smear done today.

## 2017-10-16 NOTE — Assessment & Plan Note (Signed)
Improved but not controlled yet. Increase wellbutrin to 300 mg XL daily. She will update me in a few weeks.

## 2017-10-16 NOTE — Patient Instructions (Signed)
Great to see you. We are increasing your Wellbutrin to 300 mg XL daily.   I will call you with your lab results from today and you can view them online.

## 2017-10-16 NOTE — Progress Notes (Signed)
Subjective:   Patient ID: Donna Vasquez, female    DOB: 1965-09-24, 52 y.o.   MRN: [id number]  Donna Vasquez is a pleasant 52 y.o. year old female who presents to clinic today with Annual Exam (Patient is here today for a CPE with PAP.  She is currently fasting. )  on 10/16/2017  HPI:  Mammogram 10/03/17 Colonoscopy UTD- done at [facility].  Health Maintenance  Topic Date Due  . HIV Screening  09/27/1979  . INFLUENZA VACCINE  12/18/2017  . PAP SMEAR  04/19/2019  . MAMMOGRAM  10/04/2019  . TETANUS/TDAP  07/23/2020  . COLONOSCOPY  09/15/2025   Depression- added Wellbutrin to zoloft last month.  PHQ 9 was 21 at the time.  She does feel the wellbutrin has helped with focus and concentration at work. Also wanting to do things more but still doesn't have the "oomf" to do it.    Current Outpatient Medications on File Prior to Visit  Medication Sig Dispense Refill  . albuterol (PROAIR HFA) 108 (90 Base) MCG/ACT inhaler Inhale 1-2 puffs into the lungs every 6 (six) hours as needed for wheezing or shortness of breath. 1 Inhaler 2  . aspirin 81 MG tablet Take 81 mg by mouth daily.    [name] buPROPion (WELLBUTRIN XL) 150 MG 24 hr tablet Take 1 tablet (150 mg total) by mouth daily. 90 tablet 1  . cetirizine (ZYRTEC) 10 MG tablet Take 10 mg by mouth daily.  0  . Cholecalciferol (VITAMIN D-3) 1000 UNITS CAPS Take 2 capsules by mouth daily. 3000 units    . Diclofenac Sodium (PENNSAID) 2 % SOLN Place 1 application onto the skin 2 (two) times daily. 1 Bottle 3  . Fluticasone-Salmeterol (ADVAIR) 250-50 MCG/DOSE AEPB Inhale 1 puff into the lungs daily. 60 each 2  . hyoscyamine (LEVSIN, ANASPAZ) 0.125 MG tablet Take 1 tablet (0.125 mg total) by mouth every 6 (six) hours as needed. 30 tablet 2  . montelukast (SINGULAIR) 10 MG tablet TAKE 1 TABLET (10 MG TOTAL) BY MOUTH AT BEDTIME. 30 tablet 2  . niacin (NIASPAN) 1000 MG CR tablet Take 1 tablet (1,000 mg total) by mouth at bedtime. 90 tablet 1  .  omeprazole (PRILOSEC) 20 MG capsule Take 1 capsule (20 mg total) by mouth daily. 30 capsule 2  . sertraline (ZOLOFT) 100 MG tablet TAKE 1 TABLET BY MOUTH EVERY DAY 90 tablet 1  . simvastatin (ZOCOR) 20 MG tablet Take 1 tablet (20 mg total) by mouth daily at 6 PM. 90 tablet 1  . triamcinolone cream (KENALOG) 0.1 % Apply 1 application topically 2 (two) times daily as needed. 45 g 1   No current facility-administered medications on file prior to visit.     Allergies  Allergen Reactions  . Flonase [Fluticasone Propionate] Other (See Comments)    Nasal irritation, burning, but can tolerate advair inhaler.    . Sulfa Antibiotics Hives    Past Medical History:  Diagnosis Date  . Allergy   . Asthma   . Chicken pox   . Frequent headaches   . Hyperlipidemia   . IBS (irritable bowel syndrome)   . Interstitial cystitis   . Urine incontinence     Past Surgical History:  Procedure Laterality Date  . CHOLECYSTECTOMY    . cyst removed from back    . NASAL SINUS SURGERY    . TUBAL LIGATION  1999    Family History  Problem Relation Age of Onset  . COPD Mother   .  Heart disease Mother   . Polymyalgia rheumatica Mother   . Emphysema Mother   . Stroke Mother   . Heart disease Father   . Hypertension Father   . Cancer Paternal Aunt        Breast  . Breast cancer Paternal Aunt 81  . Stroke Maternal Grandmother   . Cancer Maternal Aunt     Social History   Socioeconomic History  . Marital status: Married    Spouse name: Not on file  . Number of children: y  . Years of education: Not on file  . Highest education level: Not on file  Occupational History  . Occupation: Teacher, music  Social Needs  . Financial resource strain: Not on file  . Food insecurity:    Worry: Not on file    Inability: Not on file  . Transportation needs:    Medical: Not on file    Non-medical: Not on file  Tobacco Use  . Smoking status: Never Smoker  . Smokeless tobacco: Never Used  Substance  and Sexual Activity  . Alcohol use: Yes    Alcohol/week: 0.0 oz    Comment: rare--wine  . Drug use: No  . Sexual activity: Not on file  Lifestyle  . Physical activity:    Days per week: Not on file    Minutes per session: Not on file  . Stress: Not on file  Relationships  . Social connections:    Talks on phone: Not on file    Gets together: Not on file    Attends religious service: Not on file    Active member of club or organization: Not on file    Attends meetings of clubs or organizations: Not on file    Relationship status: Not on file  . Intimate partner violence:    Fear of current or ex partner: Not on file    Emotionally abused: Not on file    Physically abused: Not on file    Forced sexual activity: Not on file  Other Topics Concern  . Not on file  Social History Narrative  . Not on file   The PMH, PSH, Social History, Family History, Medications, and allergies have been reviewed in [facility], and have been updated if relevant.   Review of Systems  Constitutional: Negative.   HENT: Negative.   Eyes: Negative.   Respiratory: Negative.   Cardiovascular: Negative.   Gastrointestinal: Negative.   Endocrine: Negative.   Genitourinary: Negative.   Musculoskeletal: Negative.   Skin: Negative.   Allergic/Immunologic: Negative.   Neurological: Negative.   Psychiatric/Behavioral: Positive for dysphoric mood. Negative for agitation, behavioral problems, confusion, decreased concentration, hallucinations, self-injury, sleep disturbance and suicidal ideas. The patient is not nervous/anxious and is not hyperactive.   All other systems reviewed and are negative.      Objective:    BP 128/82 (BP Location: Left Arm, Patient Position: Sitting, Cuff Size: Normal)   Pulse 89   Temp 98.4 F (36.9 C) (Oral)   Ht 5' 7.75" (1.721 m)   Wt 253 lb 3.2 oz (114.9 kg)   LMP 10/02/2017   SpO2 96%   BMI 38.78 kg/m    Physical Exam   General:  Well-developed,well-nourished,in no  acute distress; alert,appropriate and cooperative throughout examination Head:  normocephalic and atraumatic.   Eyes:  vision grossly intact, PERRL Ears:  R ear normal and L ear normal externally, TMs clear bilaterally Nose:  no external deformity.   Mouth:  good dentition.  Neck:  No deformities, masses, or tenderness noted. Breasts:  No mass, nodules, thickening, tenderness, bulging, retraction, inflamation, nipple discharge or skin changes noted.   Lungs:  Normal respiratory effort, chest expands symmetrically. Lungs are clear to auscultation, no crackles or wheezes. Heart:  Normal rate and regular rhythm. S1 and S2 normal without gallop, murmur, click, rub or other extra sounds. Abdomen:  Bowel sounds positive,abdomen soft and non-tender without masses, organomegaly or hernias noted. Rectal:  no external abnormalities.   Genitalia:  Pelvic Exam:        External: normal female genitalia without lesions or masses        Vagina: normal without lesions or masses        Cervix: normal without lesions or masses        Adnexa: normal bimanual exam without masses or fullness        Uterus: normal by palpation        Pap smear: performed Msk:  No deformity or scoliosis noted of thoracic or lumbar spine.   Extremities:  No clubbing, cyanosis, edema, or deformity noted with normal full range of motion of all joints.   Neurologic:  alert & oriented X3 and gait normal.   Skin:  Intact without suspicious lesions or rashes Cervical Nodes:  No lymphadenopathy noted Axillary Nodes:  No palpable lymphadenopathy Psych:  Cognition and judgment appear intact. Alert and cooperative with normal attention span and concentration. No apparent delusions, illusions, hallucinations       Assessment & Plan:   Well woman exam with routine gynecological exam - Plan: Cytology - PAP  Hyperlipidemia, unspecified hyperlipidemia type No follow-ups on file.

## 2017-10-17 LAB — CBC WITH DIFFERENTIAL/PLATELET
BASOS: 0 %
Basophils Absolute: 0 10*3/uL (ref 0.0–0.2)
EOS (ABSOLUTE): 0.2 10*3/uL (ref 0.0–0.4)
EOS: 4 %
HEMATOCRIT: 40.2 % (ref 34.0–46.6)
Hemoglobin: 13.4 g/dL (ref 11.1–15.9)
IMMATURE GRANS (ABS): 0 10*3/uL (ref 0.0–0.1)
IMMATURE GRANULOCYTES: 0 %
LYMPHS: 23 %
Lymphocytes Absolute: 1.4 10*3/uL (ref 0.7–3.1)
MCH: 30.5 pg (ref 26.6–33.0)
MCHC: 33.3 g/dL (ref 31.5–35.7)
MCV: 92 fL (ref 79–97)
MONOCYTES: 5 %
Monocytes Absolute: 0.3 10*3/uL (ref 0.1–0.9)
NEUTROS PCT: 68 %
Neutrophils Absolute: 4.1 10*3/uL (ref 1.4–7.0)
PLATELETS: 229 10*3/uL (ref 150–450)
RBC: 4.39 x10E6/uL (ref 3.77–5.28)
RDW: 13.3 % (ref 12.3–15.4)
WBC: 6 10*3/uL (ref 3.4–10.8)

## 2017-10-17 LAB — LIPID PANEL
CHOLESTEROL TOTAL: 174 mg/dL (ref 100–199)
Chol/HDL Ratio: 3.1 ratio (ref 0.0–4.4)
HDL: 56 mg/dL (ref >39)
LDL Calculated: 92 mg/dL (ref 0–99)
Triglycerides: 129 mg/dL (ref 0–149)
VLDL CHOLESTEROL CAL: 26 mg/dL (ref 5–40)

## 2017-10-17 LAB — COMPREHENSIVE METABOLIC PANEL
ALBUMIN: 4.3 g/dL (ref 3.5–5.5)
ALT: 17 IU/L (ref 0–32)
AST: 20 IU/L (ref 0–40)
Albumin/Globulin Ratio: 1.7 (ref 1.2–2.2)
Alkaline Phosphatase: 102 IU/L (ref 39–117)
BILIRUBIN TOTAL: 0.7 mg/dL (ref 0.0–1.2)
BUN / CREAT RATIO: 16 (ref 9–23)
BUN: 15 mg/dL (ref 6–24)
CHLORIDE: 105 mmol/L (ref 96–106)
CO2: 19 mmol/L — ABNORMAL LOW (ref 20–29)
Calcium: 9.4 mg/dL (ref 8.7–10.2)
Creatinine, Ser: 0.92 mg/dL (ref 0.57–1.00)
GFR calc Af Amer: 82 mL/min/{1.73_m2} (ref >59)
GFR calc non Af Amer: 71 mL/min/{1.73_m2} (ref >59)
GLUCOSE: 92 mg/dL (ref 65–99)
Globulin, Total: 2.5 g/dL (ref 1.5–4.5)
POTASSIUM: 4.3 mmol/L (ref 3.5–5.2)
Sodium: 143 mmol/L (ref 134–144)
Total Protein: 6.8 g/dL (ref 6.0–8.5)

## 2017-10-17 LAB — TSH: TSH: 2.28 u[IU]/mL (ref 0.450–4.500)

## 2017-10-17 LAB — CYTOLOGY - PAP
Adequacy: ABSENT
Diagnosis: NEGATIVE
HPV: NOT DETECTED

## 2017-10-27 ENCOUNTER — Encounter: Payer: Self-pay | Admitting: Family Medicine

## 2017-10-27 ENCOUNTER — Ambulatory Visit: Payer: BLUE CROSS/BLUE SHIELD | Admitting: Family Medicine

## 2017-10-27 VITALS — BP 118/74 | HR 78 | Temp 98.1°F | Ht 67.75 in | Wt 254.2 lb

## 2017-10-27 DIAGNOSIS — H60501 Unspecified acute noninfective otitis externa, right ear: Secondary | ICD-10-CM | POA: Insufficient documentation

## 2017-10-27 DIAGNOSIS — F32A Depression, unspecified: Secondary | ICD-10-CM | POA: Insufficient documentation

## 2017-10-27 DIAGNOSIS — F419 Anxiety disorder, unspecified: Secondary | ICD-10-CM | POA: Insufficient documentation

## 2017-10-27 MED ORDER — NEOMYCIN-POLYMYXIN-HC 1 % OT SOLN
3.0000 [drp] | Freq: Four times a day (QID) | OTIC | 0 refills | Status: DC
Start: 2017-10-27 — End: 2018-02-26

## 2017-10-27 NOTE — Progress Notes (Signed)
Donna Vasquez - 52 y.o. female MRN [id number]  Date of birth: [birth year]  Subjective Chief Complaint  Patient presents with  . Ear Pain    pain in R ear started last night. gets shooting pain down neck. States she does have vertigo.    HPI Donna Vasquez is a 52 y.o. female here today for same day visit with complaint of R ear pain.  She tells me that pain began last evening, worse when laying on that side.  Denies any swelling, fever, chills, or drainage from the ear.  Hearing may be a little muffled.  She has had some issues with vertigo in the past but denies increased symptoms at this time.    ROS:  ROS completed and negative except as noted per HPI Allergies  Allergen Reactions  . Flonase [Fluticasone Propionate] Other (See Comments)    Nasal irritation, burning, but can tolerate advair inhaler.    . Sulfa Antibiotics Hives    Past Medical History:  Diagnosis Date  . Allergy   . Asthma   . Chicken pox   . Frequent headaches   . Hyperlipidemia   . IBS (irritable bowel syndrome)   . Interstitial cystitis   . Urine incontinence     Past Surgical History:  Procedure Laterality Date  . CHOLECYSTECTOMY    . cyst removed from back    . NASAL SINUS SURGERY    . TUBAL LIGATION  1999    Social History   Socioeconomic History  . Marital status: Married    Spouse name: Not on file  . Number of children: y  . Years of education: Not on file  . Highest education level: Not on file  Occupational History  . Occupation: Teacher, music  Social Needs  . Financial resource strain: Not on file  . Food insecurity:    Worry: Not on file    Inability: Not on file  . Transportation needs:    Medical: Not on file    Non-medical: Not on file  Tobacco Use  . Smoking status: Never Smoker  . Smokeless tobacco: Never Used  Substance and Sexual Activity  . Alcohol use: Yes    Alcohol/week: 0.0 oz    Comment: rare--wine  . Drug use: No  . Sexual activity: Not on file    Lifestyle  . Physical activity:    Days per week: Not on file    Minutes per session: Not on file  . Stress: Not on file  Relationships  . Social connections:    Talks on phone: Not on file    Gets together: Not on file    Attends religious service: Not on file    Active member of club or organization: Not on file    Attends meetings of clubs or organizations: Not on file    Relationship status: Not on file  Other Topics Concern  . Not on file  Social History Narrative  . Not on file    Family History  Problem Relation Age of Onset  . COPD Mother   . Heart disease Mother   . Polymyalgia rheumatica Mother   . Emphysema Mother   . Stroke Mother   . Heart disease Father   . Hypertension Father   . Cancer Paternal Aunt        Breast  . Breast cancer Paternal Aunt 21  . Stroke Maternal Grandmother   . Cancer Maternal Aunt     Health Maintenance  Topic Date Due  .  HIV Screening  09/27/1979  . INFLUENZA VACCINE  12/18/2017  . MAMMOGRAM  10/04/2019  . TETANUS/TDAP  07/23/2020  . PAP SMEAR  10/16/2020  . COLONOSCOPY  09/15/2025    ----------------------------------------------------------------------------------------------------------------------------------------------------------------------------------------------------------------- Physical Exam BP 118/74 (BP Location: Left Arm, Patient Position: Sitting, Cuff Size: Normal)   Pulse 78   Temp 98.1 F (36.7 C) (Oral)   Ht 5' 7.75" (1.721 m)   Wt 254 lb 3.2 oz (115.3 kg)   LMP 10/02/2017   SpO2 98%   BMI 38.94 kg/m   Physical Exam  Constitutional: She is oriented to person, place, and time. She appears well-nourished. No distress.  HENT:  Head: Normocephalic and atraumatic.  Mouth/Throat: Oropharynx is clear and moist.  No pain with manipulation of pinna Wax occluding R canal.  Lavage completed and re-examination R ear shows erythema and inflammation of the ear canal.  Small amount of serous fluid posterior  to TM.    L canal and TM normal.   Eyes: No scleral icterus.  Neck: Neck supple. No thyromegaly present.  Cardiovascular: Normal rate and regular rhythm.  Pulmonary/Chest: Effort normal and breath sounds normal.  Lymphadenopathy:    She has no cervical adenopathy.  Neurological: She is alert and oriented to person, place, and time.  Skin: Skin is warm and dry. No rash noted.  Psychiatric: She has a normal mood and affect. Her behavior is normal.    ------------------------------------------------------------------------------------------------------------------------------------------------------------------------------------------------------------------- Assessment and Plan  Acute otitis externa of right ear Ear lavaged which revealed erythematous, inflamed canal. Will treat with cortisporin  Instructed to call if not improving or for continued worsening.

## 2017-10-27 NOTE — Assessment & Plan Note (Signed)
Ear lavaged which revealed erythematous, inflamed canal. Will treat with cortisporin  Instructed to call if not improving or for continued worsening.

## 2017-10-27 NOTE — Patient Instructions (Signed)
Otitis Externa Otitis externa is an infection of the outer ear canal. The outer ear canal is the area between the outside of the ear and the eardrum. Otitis externa is sometimes called "swimmer's ear." Follow these instructions at home:  If you were given antibiotic ear drops, use them as told by your doctor. Do not stop using them even if your condition gets better.  Take over-the-counter and prescription medicines only as told by your doctor.  Keep all follow-up visits as told by your doctor. This is important. How is this prevented?  Keep your ear dry. Use the corner of a towel to dry your ear after you swim or bathe.  Try not to scratch or put things in your ear. Doing these things makes it easier for germs to grow in your ear.  Avoid swimming in lakes, dirty water, or pools that may not have the right amount of a chemical called chlorine.  Consider making ear drops and putting 3 or 4 drops in each ear after you swim. Ask your doctor about how you can make ear drops. Contact a doctor if:  You have a fever.  After 3 days your ear is still red, swollen, or painful.  After 3 days you still have pus coming from your ear.  Your redness, swelling, or pain gets worse.  You have a really bad headache.  You have redness, swelling, pain, or tenderness behind your ear. This information is not intended to replace advice given to you by your health care provider. Make sure you discuss any questions you have with your health care provider. Document Released: 10/23/2007 Document Revised: 06/01/2015 Document Reviewed: 02/13/2015 [employer] Interactive Patient Education  2018 [employer]  

## 2017-11-04 ENCOUNTER — Other Ambulatory Visit: Payer: Self-pay | Admitting: Internal Medicine

## 2017-11-04 ENCOUNTER — Encounter (INDEPENDENT_AMBULATORY_CARE_PROVIDER_SITE_OTHER): Payer: Self-pay

## 2017-11-04 ENCOUNTER — Other Ambulatory Visit: Payer: Self-pay | Admitting: Family Medicine

## 2017-11-13 ENCOUNTER — Encounter (INDEPENDENT_AMBULATORY_CARE_PROVIDER_SITE_OTHER): Payer: BLUE CROSS/BLUE SHIELD

## 2017-11-17 ENCOUNTER — Encounter: Payer: Self-pay | Admitting: Family Medicine

## 2017-11-17 ENCOUNTER — Encounter (INDEPENDENT_AMBULATORY_CARE_PROVIDER_SITE_OTHER): Payer: Self-pay | Admitting: Family Medicine

## 2017-11-17 ENCOUNTER — Ambulatory Visit (INDEPENDENT_AMBULATORY_CARE_PROVIDER_SITE_OTHER): Payer: BLUE CROSS/BLUE SHIELD | Admitting: Family Medicine

## 2017-11-17 VITALS — BP 120/80 | HR 76 | Temp 98.3°F | Ht 68.0 in | Wt 248.0 lb

## 2017-11-17 DIAGNOSIS — Z9189 Other specified personal risk factors, not elsewhere classified: Secondary | ICD-10-CM | POA: Diagnosis not present

## 2017-11-17 DIAGNOSIS — Z1331 Encounter for screening for depression: Secondary | ICD-10-CM | POA: Diagnosis not present

## 2017-11-17 DIAGNOSIS — R5383 Other fatigue: Secondary | ICD-10-CM | POA: Diagnosis not present

## 2017-11-17 DIAGNOSIS — Z0289 Encounter for other administrative examinations: Secondary | ICD-10-CM

## 2017-11-17 DIAGNOSIS — E559 Vitamin D deficiency, unspecified: Secondary | ICD-10-CM

## 2017-11-17 DIAGNOSIS — R0602 Shortness of breath: Secondary | ICD-10-CM | POA: Diagnosis not present

## 2017-11-17 DIAGNOSIS — Z6837 Body mass index (BMI) 37.0-37.9, adult: Secondary | ICD-10-CM

## 2017-11-17 NOTE — Progress Notes (Signed)
Office: [phone]  /  Fax: [phone]   Dear Dr. Dayton Martes,   Thank you for referring Donna Vasquez to our clinic. The following note includes my evaluation and treatment recommendations.  HPI:   Chief Complaint: OBESITY    Donna Vasquez has been referred by Bryn Gulling. Dayton Martes, MD for consultation regarding her obesity and obesity related comorbidities.    Donna Vasquez (MR# [id number]) is a 52 y.o. female who presents on 11/17/2017 for obesity evaluation and treatment. Current BMI is Body mass index is 37.71 kg/m.[name] Donna Vasquez has been struggling with her weight for many years and has been unsuccessful in either losing weight, maintaining weight loss, or reaching her healthy weight goal.     Donna Vasquez attended our information session and states she is currently in the action stage of change and ready to dedicate time achieving and maintaining a healthier weight. Donna Vasquez is interested in becoming our patient and working on intensive lifestyle modifications including (but not limited to) diet, exercise and weight loss.    Donna Vasquez states she thinks her family will eat healthier with  her her desired weight loss is 28 lbs she started gaining weight after having babies her heaviest weight ever was 254 lbs she is a picky eater and doesn't like to eat healthier foods  she has significant food cravings issues  she skips meals frequently she is frequently drinking liquids with calories she frequently makes poor food choices she frequently eats larger portions than normal  she struggles with emotional eating    Fatigue Donna Vasquez feels her energy is lower than it should be. This has worsened with weight gain and has not worsened recently. Donna Vasquez admits to daytime somnolence and  admits to waking up still tired. Patient is at risk for obstructive sleep apnea. Patent has a history of symptoms of morning fatigue and morning headache. Patient generally gets 6 hours of sleep per night, and states they  generally have nightime awakenings. Snoring is present. Apneic episodes are not present. Epworth Sleepiness Score is 6.  Dyspnea on exertion Donna Vasquez notes increasing shortness of breath with exercising and seems to be worsening over time with weight gain. She notes getting out of breath sooner with activity than she used to. This has not gotten worse recently. EKG-poor R wave progression with nonspecific T wave abnormality. [name] denies orthopnea.  Vitamin D Deficiency Donna Vasquez has a diagnosis of vitamin D deficiency. She is on 2,000 IU daily OTC Vit D, no recent labs. She denies nausea, vomiting or muscle weakness.  At risk for osteopenia and osteoporosis Donna Vasquez is at higher risk of osteopenia and osteoporosis due to vitamin D deficiency.   Depression Screen Donna Vasquez's Food and Mood (modified PHQ-9) score was  Depression screen PHQ 2/9 11/17/2017  Decreased Interest 3  Down, Depressed, Hopeless 3  PHQ - 2 Score 6  Altered sleeping 2  Tired, decreased energy 3  Change in appetite 3  Feeling bad or failure about yourself  0  Trouble concentrating 3  Moving slowly or fidgety/restless 0  Suicidal thoughts 0  PHQ-9 Score 17  Difficult doing work/chores Somewhat difficult    ALLERGIES: Allergies  Allergen Reactions  . Flonase [Fluticasone Propionate] Other (See Comments)    Nasal irritation, burning, but can tolerate advair inhaler.    . Sulfa Antibiotics Hives    MEDICATIONS: Current Outpatient Medications on File Prior to Visit  Medication Sig Dispense Refill  . albuterol (PROAIR HFA) 108 (90 Base) MCG/ACT inhaler Inhale 1-2 puffs into the  lungs every 6 (six) hours as needed for wheezing or shortness of breath. 1 Inhaler 2  . aspirin 81 MG tablet Take 81 mg by mouth daily.    [name] aspirin-acetaminophen-caffeine (EXCEDRIN MIGRAINE) 250-250-65 MG tablet Take by mouth every 6 (six) hours as needed for headache.    [name] buPROPion (WELLBUTRIN XL) 300 MG 24 hr tablet Take 1 tablet (300 mg  total) by mouth daily. 90 tablet 3  . Cholecalciferol (VITAMIN D-3) 1000 UNITS CAPS Take 2 capsules by mouth daily. 3000 units    . dextromethorphan-guaiFENesin (MUCINEX DM) 30-600 MG 12hr tablet Take 1 tablet by mouth 2 (two) times daily as needed for cough.    . Fluticasone-Salmeterol (ADVAIR) 100-50 MCG/DOSE AEPB Inhale 1 puff into the lungs 2 (two) times daily.    . montelukast (SINGULAIR) 10 MG tablet TAKE 1 TABLET (10 MG TOTAL) BY MOUTH AT BEDTIME. 30 tablet 2  . NEOMYCIN-POLYMYXIN-HYDROCORTISONE (CORTISPORIN) 1 % SOLN OTIC solution Place 3 drops into the right ear 4 (four) times daily. 10 mL 0  . niacin (NIASPAN) 1000 MG CR tablet Take 1 tablet (1,000 mg total) by mouth at bedtime. 90 tablet 1  . omeprazole (PRILOSEC) 20 MG capsule Take 1 capsule (20 mg total) by mouth daily. 30 capsule 2  . sertraline (ZOLOFT) 100 MG tablet TAKE 1 TABLET BY MOUTH EVERY DAY 90 tablet 1  . simvastatin (ZOCOR) 20 MG tablet Take 1 tablet (20 mg total) by mouth daily at 6 PM. 90 tablet 1  . triamcinolone cream (KENALOG) 0.1 % Apply 1 application topically 2 (two) times daily as needed. 45 g 1   No current facility-administered medications on file prior to visit.     PAST MEDICAL HISTORY: Past Medical History:  Diagnosis Date  . Allergy   . Asthma   . Back pain   . Bulging lumbar disc   . Chicken pox   . Constipation   . Depression   . Fatigue   . Frequent headaches   . Gall bladder disease   . Headache   . Heartburn   . Hyperlipidemia   . IBS (irritable bowel syndrome)   . Interstitial cystitis   . RA (rheumatoid arthritis) (HCC)   . Shortness of breath on exertion   . Trouble in sleeping   . Urine incontinence   . Vertigo   . Vision changes   . Vitamin D deficiency     PAST SURGICAL HISTORY: Past Surgical History:  Procedure Laterality Date  . CHOLECYSTECTOMY    . cyst removed from back    . NASAL SINUS SURGERY    . TUBAL LIGATION  1999    SOCIAL HISTORY: Social History    Tobacco Use  . Smoking status: Never Smoker  . Smokeless tobacco: Never Used  Substance Use Topics  . Alcohol use: Yes    Alcohol/week: 0.0 oz    Comment: rare--wine  . Drug use: No    FAMILY HISTORY: Family History  Problem Relation Age of Onset  . COPD Mother   . Heart disease Mother   . Polymyalgia rheumatica Mother   . Emphysema Mother   . Stroke Mother   . Depression Mother   . Anxiety disorder Mother   . Heart disease Father   . Hypertension Father   . Hyperlipidemia Father   . Anxiety disorder Father   . Cancer Paternal Aunt        Breast  . Breast cancer Paternal Aunt 55  . Stroke Maternal Grandmother   .  Cancer Maternal Aunt     ROS: Review of Systems  Constitutional: Positive for malaise/fatigue. Negative for weight loss.       + Trouble sleeping  Eyes:       + Vision changes  Respiratory: Positive for shortness of breath (with exertion).   Cardiovascular: Negative for orthopnea.  Gastrointestinal: Positive for heartburn. Negative for nausea and vomiting.  Musculoskeletal:       Negative muscle weakness  Skin:       + Dryness  Neurological: Positive for dizziness (vertigo) and headaches.  Psychiatric/Behavioral: Positive for depression.    PHYSICAL EXAM: Blood pressure 120/80, pulse 76, temperature 98.3 F (36.8 C), temperature source Oral, height 5\' 8"  (1.727 m), weight 248 lb (112.5 kg), SpO2 98 %. Body mass index is 37.71 kg/m. Physical Exam  Constitutional: She is oriented to person, place, and time. She appears well-developed and well-nourished.  HENT:  Head: Normocephalic and atraumatic.  Nose: Nose normal.  Eyes: EOM are normal. No scleral icterus.  Neck: Normal range of motion. Neck supple. No thyromegaly present.  Cardiovascular: Normal rate and regular rhythm.  Pulmonary/Chest: Effort normal. No respiratory distress.  Abdominal: Soft. There is no tenderness.  + Obesity  Musculoskeletal:  Range of Motion normal in all 4  extremities Trace edema noted in bilateral lower extremities  Neurological: She is alert and oriented to person, place, and time. Coordination normal.  Skin: Skin is warm and dry.  Psychiatric: She has a normal mood and affect. Her behavior is normal.  Vitals reviewed.   RECENT LABS AND TESTS: BMET    Component Value Date/Time   NA 143 10/16/2017 1000   K 4.3 10/16/2017 1000   CL 105 10/16/2017 1000   CO2 19 (L) 10/16/2017 1000   GLUCOSE 92 10/16/2017 1000   GLUCOSE 93 11/13/2015 1501   BUN 15 10/16/2017 1000   CREATININE 0.92 10/16/2017 1000   CALCIUM 9.4 10/16/2017 1000   GFRNONAA 71 10/16/2017 1000   GFRAA 82 10/16/2017 1000   Lab Results  Component Value Date   HGBA1C 5.0 11/13/2015   No results found for: INSULIN CBC    Component Value Date/Time   WBC 6.0 10/16/2017 1000   WBC 8.6 11/13/2015 1501   RBC 4.39 10/16/2017 1000   RBC 4.49 11/13/2015 1501   HGB 13.4 10/16/2017 1000   HCT 40.2 10/16/2017 1000   PLT 229 10/16/2017 1000   MCV 92 10/16/2017 1000   MCH 30.5 10/16/2017 1000   MCHC 33.3 10/16/2017 1000   MCHC 34.0 11/13/2015 1501   RDW 13.3 10/16/2017 1000   LYMPHSABS 1.4 10/16/2017 1000   EOSABS 0.2 10/16/2017 1000   BASOSABS 0.0 10/16/2017 1000   Iron/TIBC/Ferritin/ %Sat No results found for: IRON, TIBC, FERRITIN, IRONPCTSAT Lipid Panel     Component Value Date/Time   CHOL 174 10/16/2017 1000   TRIG 129 10/16/2017 1000   HDL 56 10/16/2017 1000   CHOLHDL 3.1 10/16/2017 1000   CHOLHDL 3 11/13/2015 1501   VLDL 29.8 11/13/2015 1501   LDLCALC 92 10/16/2017 1000   Hepatic Function Panel     Component Value Date/Time   PROT 6.8 10/16/2017 1000   ALBUMIN 4.3 10/16/2017 1000   AST 20 10/16/2017 1000   ALT 17 10/16/2017 1000   ALKPHOS 102 10/16/2017 1000   BILITOT 0.7 10/16/2017 1000      Component Value Date/Time   TSH 2.280 10/16/2017 1000   TSH 2.12 11/13/2015 1501   TSH 1.34 05/26/2014 1631  ECG  shows NSR with a rate of 75  BPM INDIRECT CALORIMETER done today shows a VO2 of 233 and a REE of 1623.  Her calculated basal metabolic rate is 6789 thus her basal metabolic rate is worse than expected.    ASSESSMENT AND PLAN: Other fatigue - Plan: EKG 12-Lead, Vitamin B12, T3, T4, free, TSH, Insulin, random, Hemoglobin A1c, Folate  Shortness of breath on exertion  Vitamin D deficiency - Plan: VITAMIN D 25 Hydroxy (Vit-D Deficiency, Fractures)  Depression screening  At risk for osteoporosis  Class 2 severe obesity with serious comorbidity and body mass index (BMI) of 37.0 to 37.9 in adult, unspecified obesity type (HCC)  PLAN:  Fatigue Pareesa was informed that her fatigue may be related to obesity, depression or many other causes. Labs will be ordered, and in the meanwhile Donna Vasquez has agreed to work on diet, exercise and weight loss to help with fatigue. Proper sleep hygiene was discussed including the need for 7-8 hours of quality sleep each night. A sleep study was not ordered based on symptoms and Epworth score.  Dyspnea on exertion Torrey's shortness of breath appears to be obesity related and exercise induced. She has agreed to work on weight loss and gradually increase exercise to treat her exercise induced shortness of breath. If Donna Vasquez follows our instructions and loses weight without improvement of her shortness of breath, we will plan to refer to pulmonology. We will monitor this condition regularly. Donna Vasquez agrees to this plan.  Vitamin D Deficiency Donna Vasquez was informed that low vitamin D levels contributes to fatigue and are associated with obesity, breast, and colon cancer. She will follow up for routine testing of vitamin D, at least 2-3 times per year. She was informed of the risk of over-replacement of vitamin D and agrees to not increase her dose unless she discusses this with [name] first. We will check labs today and Donna Vasquez agrees to follow up with our clinic in 2 weeks.  At risk for osteopenia  and osteoporosis Donna Vasquez is at risk for osteopenia and osteoporsis due to her vitamin D deficiency. She was encouraged to take her vitamin D and follow her higher calcium diet and increase strengthening exercise to help strengthen her bones and decrease her risk of osteopenia and osteoporosis.  Depression Screen Donna Vasquez had a strongly positive depression screening. Depression is commonly associated with obesity and often results in emotional eating behaviors. We will monitor this closely and work on CBT to help improve the non-hunger eating patterns. Referral to Psychology may be required if no improvement is seen as she continues in our clinic.  Obesity Donna Vasquez is currently in the action stage of change and her goal is to continue with weight loss efforts. I recommend Donna Vasquez begin the structured treatment plan as follows:  She has agreed to follow the Category 2 plan + 100 calories Skyli has been instructed to eventually work up to a goal of 150 minutes of combined cardio and strengthening exercise per week for weight loss and overall health benefits. We discussed the following Behavioral Modification Strategies today: increasing lean protein intake, decreasing simple carbohydrates, increasing vegetables, work on meal planning and easy cooking plans, and planning for success   She was informed of the importance of frequent follow up visits to maximize her success with intensive lifestyle modifications for her multiple health conditions. She was informed we would discuss her lab results at her next visit unless there is a critical issue that needs to be addressed sooner. Donna Vasquez  agreed to keep her next visit at the agreed upon time to discuss these results.    OBESITY BEHAVIORAL INTERVENTION VISIT  Today's visit was # 1 out of 22.  Starting weight: 248 lbs Starting date: 11/17/17 Today's weight : 248 lbs  Today's date: 11/17/2017 Total lbs lost to date: 0 (Patients must lose 7 lbs in the  first 6 months to continue with counseling)   ASK: We discussed the diagnosis of obesity with Donna Vasquez today and Alekhya agreed to give [name] permission to discuss obesity behavioral modification therapy today.  ASSESS: Kenetra has the diagnosis of obesity and her BMI today is 37.72 Areona is in the action stage of change   ADVISE: Baleigh was educated on the multiple health risks of obesity as well as the benefit of weight loss to improve her health. She was advised of the need for long term treatment and the importance of lifestyle modifications.  AGREE: Multiple dietary modification options and treatment options were discussed and  Judithe agreed to the above obesity treatment plan.   I, Burt Knack, am acting as transcriptionist for Debbra Riding, MD   I have reviewed the above documentation for accuracy and completeness, and I agree with the above. - Debbra Riding, MD

## 2017-11-18 LAB — TSH: TSH: 1.99 u[IU]/mL (ref 0.450–4.500)

## 2017-11-18 LAB — FOLATE: FOLATE: 19.2 ng/mL (ref >3.0)

## 2017-11-18 LAB — T3: T3, Total: 102 ng/dL (ref 71–180)

## 2017-11-18 LAB — HEMOGLOBIN A1C
Est. average glucose Bld gHb Est-mCnc: 100 mg/dL
HEMOGLOBIN A1C: 5.1 % (ref 4.8–5.6)

## 2017-11-18 LAB — VITAMIN D 25 HYDROXY (VIT D DEFICIENCY, FRACTURES): VIT D 25 HYDROXY: 26.2 ng/mL — AB (ref 30.0–100.0)

## 2017-11-18 LAB — VITAMIN B12: Vitamin B-12: 414 pg/mL (ref 232–1245)

## 2017-11-18 LAB — T4, FREE: Free T4: 1.43 ng/dL (ref 0.82–1.77)

## 2017-11-18 LAB — INSULIN, RANDOM: INSULIN: 8.7 u[IU]/mL (ref 2.6–24.9)

## 2017-12-01 ENCOUNTER — Ambulatory Visit (INDEPENDENT_AMBULATORY_CARE_PROVIDER_SITE_OTHER): Payer: BLUE CROSS/BLUE SHIELD | Admitting: Family Medicine

## 2017-12-01 VITALS — BP 116/77 | HR 69 | Temp 98.7°F | Ht 68.0 in | Wt 246.0 lb

## 2017-12-01 DIAGNOSIS — E8881 Metabolic syndrome: Secondary | ICD-10-CM | POA: Diagnosis not present

## 2017-12-01 DIAGNOSIS — E559 Vitamin D deficiency, unspecified: Secondary | ICD-10-CM

## 2017-12-01 DIAGNOSIS — Z9189 Other specified personal risk factors, not elsewhere classified: Secondary | ICD-10-CM

## 2017-12-01 DIAGNOSIS — Z6837 Body mass index (BMI) 37.0-37.9, adult: Secondary | ICD-10-CM | POA: Diagnosis not present

## 2017-12-01 MED ORDER — VITAMIN D (ERGOCALCIFEROL) 1.25 MG (50000 UNIT) PO CAPS: 50000.0000 [IU] | ORAL_CAPSULE | ORAL | 0 refills | Status: DC

## 2017-12-01 NOTE — Progress Notes (Signed)
Office: [phone]  /  Fax: [phone]   HPI:   Chief Complaint: OBESITY Donna Vasquez is here to discuss her progress with her obesity treatment plan. She is on the Category 2 plan +100 calories and is following her eating plan approximately 95 % of the time. She states she is exercising 0 minutes 0 times per week. Donna Vasquez reports hunger after 1.5 to 2 hours. She is not a huge meat eater so she found dinner to be difficult.  Her weight is 246 lb (111.6 kg) today and has had a weight loss of 2 pounds over a period of 2 week since her last visit. She has lost 2 lbs since starting treatment with [name].  Vitamin D deficiency Donna Vasquez has a diagnosis of vitamin D deficiency. She is currently taking OTC vit D 2,000 IU daily and denies nausea, vomiting or muscle weakness.  At risk for osteopenia and osteoporosis Donna Vasquez is at higher risk of osteopenia and osteoporosis due to vitamin D deficiency.   Insulin Resistance Donna Vasquez has a diagnosis of insulin resistance based on her elevated fasting insulin level of 8.7 and a Hgb A1c of 5.1. Although Donna Vasquez's blood glucose readings are still under good control, insulin resistance puts her at greater risk of metabolic syndrome and diabetes. She is not taking metformin currently and continues to work on diet and exercise to decrease risk of diabetes.  ALLERGIES: Allergies  Allergen Reactions  . Flonase [Fluticasone Propionate] Other (See Comments)    Nasal irritation, burning, but can tolerate advair inhaler.    . Sulfa Antibiotics Hives    MEDICATIONS: Current Outpatient Medications on File Prior to Visit  Medication Sig Dispense Refill  . albuterol (PROAIR HFA) 108 (90 Base) MCG/ACT inhaler Inhale 1-2 puffs into the lungs every 6 (six) hours as needed for wheezing or shortness of breath. 1 Inhaler 2  . aspirin 81 MG tablet Take 81 mg by mouth daily.    [name] aspirin-acetaminophen-caffeine (EXCEDRIN MIGRAINE) 250-250-65 MG tablet Take by mouth every 6  (six) hours as needed for headache.    [name] buPROPion (WELLBUTRIN XL) 300 MG 24 hr tablet Take 1 tablet (300 mg total) by mouth daily. 90 tablet 3  . dextromethorphan-guaiFENesin (MUCINEX DM) 30-600 MG 12hr tablet Take 1 tablet by mouth 2 (two) times daily as needed for cough.    . Fluticasone-Salmeterol (ADVAIR) 100-50 MCG/DOSE AEPB Inhale 1 puff into the lungs 2 (two) times daily.    . montelukast (SINGULAIR) 10 MG tablet TAKE 1 TABLET (10 MG TOTAL) BY MOUTH AT BEDTIME. 30 tablet 2  . NEOMYCIN-POLYMYXIN-HYDROCORTISONE (CORTISPORIN) 1 % SOLN OTIC solution Place 3 drops into the right ear 4 (four) times daily. 10 mL 0  . niacin (NIASPAN) 1000 MG CR tablet Take 1 tablet (1,000 mg total) by mouth at bedtime. 90 tablet 1  . omeprazole (PRILOSEC) 20 MG capsule Take 1 capsule (20 mg total) by mouth daily. 30 capsule 2  . sertraline (ZOLOFT) 100 MG tablet TAKE 1 TABLET BY MOUTH EVERY DAY 90 tablet 1  . simvastatin (ZOCOR) 20 MG tablet Take 1 tablet (20 mg total) by mouth daily at 6 PM. 90 tablet 1  . triamcinolone cream (KENALOG) 0.1 % Apply 1 application topically 2 (two) times daily as needed. 45 g 1   No current facility-administered medications on file prior to visit.     PAST MEDICAL HISTORY: Past Medical History:  Diagnosis Date  . Allergy   . Asthma   . Back pain   . Bulging  lumbar disc   . Chicken pox   . Constipation   . Depression   . Fatigue   . Frequent headaches   . Gall bladder disease   . Headache   . Heartburn   . Hyperlipidemia   . IBS (irritable bowel syndrome)   . Interstitial cystitis   . RA (rheumatoid arthritis) (HCC)   . Shortness of breath on exertion   . Trouble in sleeping   . Urine incontinence   . Vertigo   . Vision changes   . Vitamin D deficiency     PAST SURGICAL HISTORY: Past Surgical History:  Procedure Laterality Date  . CHOLECYSTECTOMY    . cyst removed from back    . NASAL SINUS SURGERY    . TUBAL LIGATION  1999    SOCIAL HISTORY: Social  History   Tobacco Use  . Smoking status: Never Smoker  . Smokeless tobacco: Never Used  Substance Use Topics  . Alcohol use: Yes    Alcohol/week: 0.0 oz    Comment: rare--wine  . Drug use: No    FAMILY HISTORY: Family History  Problem Relation Age of Onset  . COPD Mother   . Heart disease Mother   . Polymyalgia rheumatica Mother   . Emphysema Mother   . Stroke Mother   . Depression Mother   . Anxiety disorder Mother   . Heart disease Father   . Hypertension Father   . Hyperlipidemia Father   . Anxiety disorder Father   . Cancer Paternal Aunt        Breast  . Breast cancer Paternal Aunt 1  . Stroke Maternal Grandmother   . Cancer Maternal Aunt     ROS: Review of Systems  Constitutional: Positive for weight loss.  Gastrointestinal: Negative for nausea and vomiting.  Musculoskeletal:       Negative for muscle weakness    PHYSICAL EXAM: Blood pressure 116/77, pulse 69, temperature 98.7 F (37.1 C), temperature source Oral, height 5\' 8"  (1.727 m), weight 246 lb (111.6 kg), SpO2 98 %. Body mass index is 37.4 kg/m. Physical Exam  Constitutional: She is oriented to person, place, and time. She appears well-developed and well-nourished.  Cardiovascular: Normal rate.  Pulmonary/Chest: Effort normal.  Musculoskeletal: Normal range of motion.  Neurological: She is oriented to person, place, and time.  Skin: Skin is warm and dry.  Psychiatric: She has a normal mood and affect. Her behavior is normal.    RECENT LABS AND TESTS: BMET    Component Value Date/Time   NA 143 10/16/2017 1000   K 4.3 10/16/2017 1000   CL 105 10/16/2017 1000   CO2 19 (L) 10/16/2017 1000   GLUCOSE 92 10/16/2017 1000   GLUCOSE 93 11/13/2015 1501   BUN 15 10/16/2017 1000   CREATININE 0.92 10/16/2017 1000   CALCIUM 9.4 10/16/2017 1000   GFRNONAA 71 10/16/2017 1000   GFRAA 82 10/16/2017 1000   Lab Results  Component Value Date   HGBA1C 5.1 11/17/2017   HGBA1C 5.0 11/13/2015   Lab  Results  Component Value Date   INSULIN 8.7 11/17/2017   CBC    Component Value Date/Time   WBC 6.0 10/16/2017 1000   WBC 8.6 11/13/2015 1501   RBC 4.39 10/16/2017 1000   RBC 4.49 11/13/2015 1501   HGB 13.4 10/16/2017 1000   HCT 40.2 10/16/2017 1000   PLT 229 10/16/2017 1000   MCV 92 10/16/2017 1000   MCH 30.5 10/16/2017 1000   MCHC 33.3 10/16/2017  1000   MCHC 34.0 11/13/2015 1501   RDW 13.3 10/16/2017 1000   LYMPHSABS 1.4 10/16/2017 1000   EOSABS 0.2 10/16/2017 1000   BASOSABS 0.0 10/16/2017 1000   Iron/TIBC/Ferritin/ %Sat No results found for: IRON, TIBC, FERRITIN, IRONPCTSAT Lipid Panel     Component Value Date/Time   CHOL 174 10/16/2017 1000   TRIG 129 10/16/2017 1000   HDL 56 10/16/2017 1000   CHOLHDL 3.1 10/16/2017 1000   CHOLHDL 3 11/13/2015 1501   VLDL 29.8 11/13/2015 1501   LDLCALC 92 10/16/2017 1000   Hepatic Function Panel     Component Value Date/Time   PROT 6.8 10/16/2017 1000   ALBUMIN 4.3 10/16/2017 1000   AST 20 10/16/2017 1000   ALT 17 10/16/2017 1000   ALKPHOS 102 10/16/2017 1000   BILITOT 0.7 10/16/2017 1000      Component Value Date/Time   TSH 1.990 11/17/2017 1158   TSH 2.280 10/16/2017 1000   TSH 2.12 11/13/2015 1501          Results for Donna Vasquez, Donna R "KATIE" (MRN [id number]) as of 12/01/2017 17:29  Ref. Range 11/17/2017 11:58  Vitamin D, 25-Hydroxy Latest Ref Range: 30.0 - 100.0 ng/mL 26.2 (L)   ASSESSMENT AND PLAN: Vitamin D deficiency - Plan: Vitamin D, Ergocalciferol, (DRISDOL) 50000 units CAPS capsule  Insulin resistance  At risk for osteoporosis  Class 2 severe obesity with serious comorbidity and body mass index (BMI) of 37.0 to 37.9 in adult, unspecified obesity type (HCC)  PLAN:  Vitamin D Deficiency Donna Vasquez was informed that low vitamin D levels contributes to fatigue and are associated with obesity, breast, and colon cancer. Donna Vasquez will stop OTC 2,000 IU/daily. She agrees to start prescription Vit D @50 ,000 IU  every week # 4 with no refills and will follow up for routine testing of vitamin D, at least 2-3 times per year. She was informed of the risk of over-replacement of vitamin D and agrees to not increase her dose unless she discusses this with first. Donna Vasquez agrees to follow up as directed.  At risk for osteopenia and osteoporosis Donna Vasquez is at risk for osteopenia and osteoporosis due to her vitamin D deficiency. She was encouraged to take her vitamin D and follow her higher calcium diet and increase strengthening exercise to help strengthen her bones and decrease her risk of osteopenia and osteoporosis.  Insulin Resistance Donna Vasquez will continue with the category 2 meal plan and will continue to work on weight loss, exercise, and decreasing simple carbohydrates in her diet to help decrease the risk of diabetes. No further interventions. She was informed that eating too many simple carbohydrates or too many calories at one sitting increases the likelihood of GI side effects. Donna Vasquez agreed to follow up with Donna Vasquez as directed to monitor her progress.  Obesity Donna Vasquez is currently in the action stage of change. As such, her goal is to continue with weight loss efforts She has agreed to follow the Category 2 plan Donna Vasquez has been instructed to work up to a goal of 150 minutes of combined cardio and strengthening exercise per week for weight loss and overall health benefits. We discussed the following Behavioral Modification Strategies today: increasing lean protein intake, increasing vegetables, work on meal planning and easy cooking plans, and planning for success.  Donna Vasquez has agreed to follow up with our clinic in 2 weeks. She was informed of the importance of frequent follow up visits to maximize her success with intensive lifestyle modifications for her multiple health  conditions.   OBESITY BEHAVIORAL INTERVENTION VISIT  Today's visit was # 2 out of 22.  Starting weight: 248 lbs Starting date:  11/17/17 Today's weight : Weight: 246 lb (111.6 kg)  Today's date: 12/01/2017 Total lbs lost to date: 2 lbs  ASK: We discussed the diagnosis of obesity with Donna Vasquez today and Donna Vasquez agreed to give [name] permission to discuss obesity behavioral modification therapy today.  ASSESS: Donna Vasquez has the diagnosis of obesity and her BMI today is 37.41  Kandiss is in the action stage of change   ADVISE: Adelina was educated on the multiple health risks of obesity as well as the benefit of weight loss to improve her health. She was advised of the need for long term treatment and the importance of lifestyle modifications.  AGREE: Multiple dietary modification options and treatment options were discussed and  Assunta agreed to the above obesity treatment plan.  I, Kirke Corin, am acting as Energy manager for Filbert Schilder, MD  I have reviewed the above documentation for accuracy and completeness, and I agree with the above. - Debbra Riding, MD

## 2017-12-09 ENCOUNTER — Encounter: Payer: Self-pay | Admitting: Family Medicine

## 2017-12-09 ENCOUNTER — Ambulatory Visit (INDEPENDENT_AMBULATORY_CARE_PROVIDER_SITE_OTHER): Payer: Self-pay | Admitting: Family Medicine

## 2017-12-15 ENCOUNTER — Ambulatory Visit (INDEPENDENT_AMBULATORY_CARE_PROVIDER_SITE_OTHER): Payer: BLUE CROSS/BLUE SHIELD | Admitting: Family Medicine

## 2017-12-15 VITALS — BP 115/78 | HR 77 | Temp 97.5°F | Ht 68.0 in | Wt 243.0 lb

## 2017-12-15 DIAGNOSIS — E559 Vitamin D deficiency, unspecified: Secondary | ICD-10-CM | POA: Diagnosis not present

## 2017-12-15 DIAGNOSIS — Z6837 Body mass index (BMI) 37.0-37.9, adult: Secondary | ICD-10-CM | POA: Diagnosis not present

## 2017-12-15 DIAGNOSIS — E8881 Metabolic syndrome: Secondary | ICD-10-CM | POA: Diagnosis not present

## 2017-12-16 NOTE — Progress Notes (Signed)
Office: [phone]  /  Fax: [phone]   HPI:   Chief Complaint: OBESITY Donna Vasquez is here to discuss her progress with her obesity treatment plan. She is on the Category 2 plan and is following her eating plan approximately 95 % of the time. She states she is exercising 0 minutes 0 times per week. Donna Vasquez is still struggling with getting all the protein in and dealing with hunger. Her weight is 243 lb (110.2 kg) today and has had a weight loss of 3 pounds over a period of 2 weeks since her last visit. She has lost 5 lbs since starting treatment with [name].  Vitamin D deficiency Donna Vasquez has a diagnosis of vitamin D deficiency. She is currently taking vit D and admits fatigue, but denies nausea, vomiting or muscle weakness.  Insulin Resistance Donna Vasquez has a diagnosis of insulin resistance based on her elevated fasting insulin level >5. Although Donna Vasquez's blood glucose readings are still under good control, insulin resistance puts her at greater risk of metabolic syndrome and diabetes. She is not taking metformin currently and continues to work on diet and exercise to decrease risk of diabetes. Donna Vasquez admits hunger.  ALLERGIES: Allergies  Allergen Reactions  . Flonase [Fluticasone Propionate] Other (See Comments)    Nasal irritation, burning, but can tolerate advair inhaler.    . Sulfa Antibiotics Hives    MEDICATIONS: Current Outpatient Medications on File Prior to Visit  Medication Sig Dispense Refill  . albuterol (PROAIR HFA) 108 (90 Base) MCG/ACT inhaler Inhale 1-2 puffs into the lungs every 6 (six) hours as needed for wheezing or shortness of breath. 1 Inhaler 2  . aspirin 81 MG tablet Take 81 mg by mouth daily.    [name] aspirin-acetaminophen-caffeine (EXCEDRIN MIGRAINE) 250-250-65 MG tablet Take by mouth every 6 (six) hours as needed for headache.    [name] buPROPion (WELLBUTRIN XL) 300 MG 24 hr tablet Take 1 tablet (300 mg total) by mouth daily. 90 tablet 3  .  dextromethorphan-guaiFENesin (MUCINEX DM) 30-600 MG 12hr tablet Take 1 tablet by mouth 2 (two) times daily as needed for cough.    . Fluticasone-Salmeterol (ADVAIR) 100-50 MCG/DOSE AEPB Inhale 1 puff into the lungs 2 (two) times daily.    . montelukast (SINGULAIR) 10 MG tablet TAKE 1 TABLET (10 MG TOTAL) BY MOUTH AT BEDTIME. 30 tablet 2  . NEOMYCIN-POLYMYXIN-HYDROCORTISONE (CORTISPORIN) 1 % SOLN OTIC solution Place 3 drops into the right ear 4 (four) times daily. 10 mL 0  . niacin (NIASPAN) 1000 MG CR tablet Take 1 tablet (1,000 mg total) by mouth at bedtime. 90 tablet 1  . omeprazole (PRILOSEC) 20 MG capsule Take 1 capsule (20 mg total) by mouth daily. 30 capsule 2  . sertraline (ZOLOFT) 100 MG tablet TAKE 1 TABLET BY MOUTH EVERY DAY 90 tablet 1  . simvastatin (ZOCOR) 20 MG tablet Take 1 tablet (20 mg total) by mouth daily at 6 PM. 90 tablet 1  . triamcinolone cream (KENALOG) 0.1 % Apply 1 application topically 2 (two) times daily as needed. 45 g 1  . Vitamin D, Ergocalciferol, (DRISDOL) 50000 units CAPS capsule Take 1 capsule (50,000 Units total) by mouth every 7 (seven) days. 4 capsule 0   No current facility-administered medications on file prior to visit.     PAST MEDICAL HISTORY: Past Medical History:  Diagnosis Date  . Allergy   . Asthma   . Back pain   . Bulging lumbar disc   . Chicken pox   . Constipation   .  Depression   . Fatigue   . Frequent headaches   . Gall bladder disease   . Headache   . Heartburn   . Hyperlipidemia   . IBS (irritable bowel syndrome)   . Interstitial cystitis   . RA (rheumatoid arthritis) (HCC)   . Shortness of breath on exertion   . Trouble in sleeping   . Urine incontinence   . Vertigo   . Vision changes   . Vitamin D deficiency     PAST SURGICAL HISTORY: Past Surgical History:  Procedure Laterality Date  . CHOLECYSTECTOMY    . cyst removed from back    . NASAL SINUS SURGERY    . TUBAL LIGATION  1999    SOCIAL HISTORY: Social  History   Tobacco Use  . Smoking status: Never Smoker  . Smokeless tobacco: Never Used  Substance Use Topics  . Alcohol use: Yes    Alcohol/week: 0.0 oz    Comment: rare--wine  . Drug use: No    FAMILY HISTORY: Family History  Problem Relation Age of Onset  . COPD Mother   . Heart disease Mother   . Polymyalgia rheumatica Mother   . Emphysema Mother   . Stroke Mother   . Depression Mother   . Anxiety disorder Mother   . Heart disease Father   . Hypertension Father   . Hyperlipidemia Father   . Anxiety disorder Father   . Cancer Paternal Aunt        Breast  . Breast cancer Paternal Aunt 72  . Stroke Maternal Grandmother   . Cancer Maternal Aunt     ROS: Review of Systems  Constitutional: Positive for malaise/fatigue and weight loss.  Gastrointestinal: Negative for nausea and vomiting.  Musculoskeletal:       Negative for muscle weakness  Endo/Heme/Allergies:       Positive for polyphagia    PHYSICAL EXAM: Blood pressure 115/78, pulse 77, temperature (!) 97.5 F (36.4 C), temperature source Oral, height 5\' 8"  (1.727 m), weight 243 lb (110.2 kg), SpO2 99 %. Body mass index is 36.95 kg/m. Physical Exam  Constitutional: She is oriented to person, place, and time. She appears well-developed and well-nourished.  Cardiovascular: Normal rate.  Pulmonary/Chest: Effort normal.  Musculoskeletal: Normal range of motion.  Neurological: She is oriented to person, place, and time.  Skin: Skin is warm and dry.  Psychiatric: She has a normal mood and affect. Her behavior is normal.  Vitals reviewed.   RECENT LABS AND TESTS: BMET    Component Value Date/Time   NA 143 10/16/2017 1000   K 4.3 10/16/2017 1000   CL 105 10/16/2017 1000   CO2 19 (L) 10/16/2017 1000   GLUCOSE 92 10/16/2017 1000   GLUCOSE 93 11/13/2015 1501   BUN 15 10/16/2017 1000   CREATININE 0.92 10/16/2017 1000   CALCIUM 9.4 10/16/2017 1000   GFRNONAA 71 10/16/2017 1000   GFRAA 82 10/16/2017 1000    Lab Results  Component Value Date   HGBA1C 5.1 11/17/2017   HGBA1C 5.0 11/13/2015   Lab Results  Component Value Date   INSULIN 8.7 11/17/2017   CBC    Component Value Date/Time   WBC 6.0 10/16/2017 1000   WBC 8.6 11/13/2015 1501   RBC 4.39 10/16/2017 1000   RBC 4.49 11/13/2015 1501   HGB 13.4 10/16/2017 1000   HCT 40.2 10/16/2017 1000   PLT 229 10/16/2017 1000   MCV 92 10/16/2017 1000   MCH 30.5 10/16/2017 1000   MCHC  33.3 10/16/2017 1000   MCHC 34.0 11/13/2015 1501   RDW 13.3 10/16/2017 1000   LYMPHSABS 1.4 10/16/2017 1000   EOSABS 0.2 10/16/2017 1000   BASOSABS 0.0 10/16/2017 1000   Iron/TIBC/Ferritin/ %Sat No results found for: IRON, TIBC, FERRITIN, IRONPCTSAT Lipid Panel     Component Value Date/Time   CHOL 174 10/16/2017 1000   TRIG 129 10/16/2017 1000   HDL 56 10/16/2017 1000   CHOLHDL 3.1 10/16/2017 1000   CHOLHDL 3 11/13/2015 1501   VLDL 29.8 11/13/2015 1501   LDLCALC 92 10/16/2017 1000   Hepatic Function Panel     Component Value Date/Time   PROT 6.8 10/16/2017 1000   ALBUMIN 4.3 10/16/2017 1000   AST 20 10/16/2017 1000   ALT 17 10/16/2017 1000   ALKPHOS 102 10/16/2017 1000   BILITOT 0.7 10/16/2017 1000      Component Value Date/Time   TSH 1.990 11/17/2017 1158   TSH 2.280 10/16/2017 1000   TSH 2.12 11/13/2015 1501   Results for Donna Vasquez, Donna R "KATIE" (MRN [id number]) as of 12/16/2017 12:12  Ref. Range 11/17/2017 11:58  Vitamin D, 25-Hydroxy Latest Ref Range: 30.0 - 100.0 ng/mL 26.2 (L)   ASSESSMENT AND PLAN: Vitamin D deficiency  Insulin resistance  Class 2 severe obesity with serious comorbidity and body mass index (BMI) of 37.0 to 37.9 in adult, unspecified obesity type (HCC)  PLAN:  Vitamin D Deficiency Aspynn was informed that low vitamin D levels contributes to fatigue and are associated with obesity, breast, and colon cancer. She agrees to continue to take prescription Vit D @50 ,000 IU every week (no refill needed) and will  follow up for routine testing of vitamin D, at least 2-3 times per year. She was informed of the risk of over-replacement of vitamin D and agrees to not increase her dose unless she discusses this with [name] first.  Insulin Resistance Donna Vasquez will continue to work on weight loss, exercise, and decreasing simple carbohydrates in her diet to help decrease the risk of diabetes. We dicussed metformin including benefits and risks. She was informed that eating too many simple carbohydrates or too many calories at one sitting increases the likelihood of GI side effects. Donna Vasquez will continue the category 2 plan with journaling options and follow up with [name] as directed to monitor her progress.  We spent > than 50% of the 15 minute visit on the counseling as documented in the note.  Obesity Donna Vasquez is currently in the action stage of change. As such, her goal is to continue with weight loss efforts She has agreed to keep a food journal with 300 to 400 calories and 25 + grams of protein at lunch, 400 to 500 calories and 30+ grams of protein at supper when moving and follow the Category 2 plan Donna Vasquez has been instructed to work up to a goal of 150 minutes of combined cardio and strengthening exercise per week for weight loss and overall health benefits. We discussed the following Behavioral Modification Strategies today: planning for success, keep a strict food journal, increasing lean protein intake, increasing vegetables and work on meal planning and easy cooking plans  Donna Vasquez has agreed to follow up with our clinic in 2 weeks. She was informed of the importance of frequent follow up visits to maximize her success with intensive lifestyle modifications for her multiple health conditions.   OBESITY BEHAVIORAL INTERVENTION VISIT  Today's visit was # 3 out of 22.  Starting weight: 248 lbs Starting date: 11/17/17 Today's weight :  243 lbs Today's date: 7/292019 Total lbs lost to date: 5    ASK: We  discussed the diagnosis of obesity with Jeananne Rama today and Jania agreed to give [name] permission to discuss obesity behavioral modification therapy today.  ASSESS: Areal has the diagnosis of obesity and her BMI today is 36.96 Jerelene is in the action stage of change   ADVISE: Nevin was educated on the multiple health risks of obesity as well as the benefit of weight loss to improve her health. She was advised of the need for long term treatment and the importance of lifestyle modifications.  AGREE: Multiple dietary modification options and treatment options were discussed and  Melani agreed to the above obesity treatment plan.  I, [place] Crane, am acting as transcriptionist for Filbert Schilder, MD  I have reviewed the above documentation for accuracy and completeness, and I agree with the above. - Debbra Riding, MD

## 2017-12-31 ENCOUNTER — Ambulatory Visit (INDEPENDENT_AMBULATORY_CARE_PROVIDER_SITE_OTHER): Payer: BLUE CROSS/BLUE SHIELD | Admitting: Family Medicine

## 2017-12-31 VITALS — BP 126/80 | HR 71 | Temp 98.1°F | Ht 68.0 in | Wt 242.0 lb

## 2017-12-31 DIAGNOSIS — Z9189 Other specified personal risk factors, not elsewhere classified: Secondary | ICD-10-CM | POA: Diagnosis not present

## 2017-12-31 DIAGNOSIS — Z6836 Body mass index (BMI) 36.0-36.9, adult: Secondary | ICD-10-CM

## 2017-12-31 DIAGNOSIS — E7849 Other hyperlipidemia: Secondary | ICD-10-CM | POA: Diagnosis not present

## 2017-12-31 DIAGNOSIS — E559 Vitamin D deficiency, unspecified: Secondary | ICD-10-CM | POA: Diagnosis not present

## 2017-12-31 MED ORDER — VITAMIN D (ERGOCALCIFEROL) 1.25 MG (50000 UNIT) PO CAPS: 50000.0000 [IU] | ORAL_CAPSULE | ORAL | 0 refills | Status: DC

## 2017-12-31 NOTE — Progress Notes (Signed)
Office: [phone]  /  Fax: [phone]   HPI:   Chief Complaint: OBESITY Donna Vasquez is here to discuss her progress with her obesity treatment plan. She is on the keep a food journal with 300-400 calories and 25+ grams of protein at lunch daily, 400-500 calories and 30+ grams of protein at supper daily when moving and follow the Category 2 plan and is following her eating plan approximately 80-90 % of the time. She states she is exercising 0 minutes 0 times per week. Donna Vasquez notes the past few weeks have been a little difficult. Daughter moved out to her own house and moved son into dorm.  Her weight is 242 lb (109.8 kg) today and has had a weight loss of 1 pounds over a period of 2 weeks since her last visit. She has lost 6 lbs since starting treatment with [name].  Vitamin D Deficiency Donna Vasquez has a diagnosis of vitamin D deficiency. She is currently taking prescription Vit D. She notes fatigue and denies nausea, vomiting or muscle weakness.  At risk for osteopenia and osteoporosis Donna Vasquez is at higher risk of osteopenia and osteoporosis due to vitamin D deficiency.   Hyperlipidemia Donna Vasquez has hyperlipidemia and has been trying to improve her cholesterol levels with intensive lifestyle modification including a low saturated fat diet, exercise and weight loss. Cholesterol levels are controlled. She denies any chest pain, claudication or myalgias on statin.  ALLERGIES: Allergies  Allergen Reactions  . Flonase [Fluticasone Propionate] Other (See Comments)    Nasal irritation, burning, but can tolerate advair inhaler.    . Sulfa Antibiotics Hives    MEDICATIONS: Current Outpatient Medications on File Prior to Visit  Medication Sig Dispense Refill  . albuterol (PROAIR HFA) 108 (90 Base) MCG/ACT inhaler Inhale 1-2 puffs into the lungs every 6 (six) hours as needed for wheezing or shortness of breath. 1 Inhaler 2  . aspirin 81 MG tablet Take 81 mg by mouth daily.    [name]  aspirin-acetaminophen-caffeine (EXCEDRIN MIGRAINE) 250-250-65 MG tablet Take by mouth every 6 (six) hours as needed for headache.    [name] buPROPion (WELLBUTRIN XL) 300 MG 24 hr tablet Take 1 tablet (300 mg total) by mouth daily. 90 tablet 3  . dextromethorphan-guaiFENesin (MUCINEX DM) 30-600 MG 12hr tablet Take 1 tablet by mouth 2 (two) times daily as needed for cough.    . Fluticasone-Salmeterol (ADVAIR) 100-50 MCG/DOSE AEPB Inhale 1 puff into the lungs 2 (two) times daily.    . montelukast (SINGULAIR) 10 MG tablet TAKE 1 TABLET (10 MG TOTAL) BY MOUTH AT BEDTIME. 30 tablet 2  . NEOMYCIN-POLYMYXIN-HYDROCORTISONE (CORTISPORIN) 1 % SOLN OTIC solution Place 3 drops into the right ear 4 (four) times daily. 10 mL 0  . niacin (NIASPAN) 1000 MG CR tablet Take 1 tablet (1,000 mg total) by mouth at bedtime. 90 tablet 1  . omeprazole (PRILOSEC) 20 MG capsule Take 1 capsule (20 mg total) by mouth daily. 30 capsule 2  . sertraline (ZOLOFT) 100 MG tablet TAKE 1 TABLET BY MOUTH EVERY DAY 90 tablet 1  . simvastatin (ZOCOR) 20 MG tablet Take 1 tablet (20 mg total) by mouth daily at 6 PM. 90 tablet 1  . triamcinolone cream (KENALOG) 0.1 % Apply 1 application topically 2 (two) times daily as needed. 45 g 1   No current facility-administered medications on file prior to visit.     PAST MEDICAL HISTORY: Past Medical History:  Diagnosis Date  . Allergy   . Asthma   . Back  pain   . Bulging lumbar disc   . Chicken pox   . Constipation   . Depression   . Fatigue   . Frequent headaches   . Gall bladder disease   . Headache   . Heartburn   . Hyperlipidemia   . IBS (irritable bowel syndrome)   . Interstitial cystitis   . RA (rheumatoid arthritis) (HCC)   . Shortness of breath on exertion   . Trouble in sleeping   . Urine incontinence   . Vertigo   . Vision changes   . Vitamin D deficiency     PAST SURGICAL HISTORY: Past Surgical History:  Procedure Laterality Date  . CHOLECYSTECTOMY    . cyst removed  from back    . NASAL SINUS SURGERY    . TUBAL LIGATION  1999    SOCIAL HISTORY: Social History   Tobacco Use  . Smoking status: Never Smoker  . Smokeless tobacco: Never Used  Substance Use Topics  . Alcohol use: Yes    Alcohol/week: 0.0 standard drinks    Comment: rare--wine  . Drug use: No    FAMILY HISTORY: Family History  Problem Relation Age of Onset  . COPD Mother   . Heart disease Mother   . Polymyalgia rheumatica Mother   . Emphysema Mother   . Stroke Mother   . Depression Mother   . Anxiety disorder Mother   . Heart disease Father   . Hypertension Father   . Hyperlipidemia Father   . Anxiety disorder Father   . Cancer Paternal Aunt        Breast  . Breast cancer Paternal Aunt 61  . Stroke Maternal Grandmother   . Cancer Maternal Aunt     ROS: Review of Systems  Constitutional: Positive for malaise/fatigue and weight loss.  Cardiovascular: Negative for chest pain and claudication.  Gastrointestinal: Negative for nausea and vomiting.  Musculoskeletal: Negative for myalgias.       Negative muscle weakness    PHYSICAL EXAM: Blood pressure 126/80, pulse 71, temperature 98.1 F (36.7 C), temperature source Oral, height 5\' 8"  (1.727 m), weight 242 lb (109.8 kg), SpO2 97 %. Body mass index is 36.8 kg/m. Physical Exam  Constitutional: She is oriented to person, place, and time. She appears well-developed and well-nourished.  Cardiovascular: Normal rate.  Pulmonary/Chest: Effort normal.  Musculoskeletal: Normal range of motion.  Neurological: She is oriented to person, place, and time.  Skin: Skin is warm and dry.  Psychiatric: She has a normal mood and affect. Her behavior is normal.  Vitals reviewed.   RECENT LABS AND TESTS: BMET    Component Value Date/Time   NA 143 10/16/2017 1000   K 4.3 10/16/2017 1000   CL 105 10/16/2017 1000   CO2 19 (L) 10/16/2017 1000   GLUCOSE 92 10/16/2017 1000   GLUCOSE 93 11/13/2015 1501   BUN 15 10/16/2017 1000    CREATININE 0.92 10/16/2017 1000   CALCIUM 9.4 10/16/2017 1000   GFRNONAA 71 10/16/2017 1000   GFRAA 82 10/16/2017 1000   Lab Results  Component Value Date   HGBA1C 5.1 11/17/2017   HGBA1C 5.0 11/13/2015   Lab Results  Component Value Date   INSULIN 8.7 11/17/2017   CBC    Component Value Date/Time   WBC 6.0 10/16/2017 1000   WBC 8.6 11/13/2015 1501   RBC 4.39 10/16/2017 1000   RBC 4.49 11/13/2015 1501   HGB 13.4 10/16/2017 1000   HCT 40.2 10/16/2017 1000   PLT  229 10/16/2017 1000   MCV 92 10/16/2017 1000   MCH 30.5 10/16/2017 1000   MCHC 33.3 10/16/2017 1000   MCHC 34.0 11/13/2015 1501   RDW 13.3 10/16/2017 1000   LYMPHSABS 1.4 10/16/2017 1000   EOSABS 0.2 10/16/2017 1000   BASOSABS 0.0 10/16/2017 1000   Iron/TIBC/Ferritin/ %Sat No results found for: IRON, TIBC, FERRITIN, IRONPCTSAT Lipid Panel     Component Value Date/Time   CHOL 174 10/16/2017 1000   TRIG 129 10/16/2017 1000   HDL 56 10/16/2017 1000   CHOLHDL 3.1 10/16/2017 1000   CHOLHDL 3 11/13/2015 1501   VLDL 29.8 11/13/2015 1501   LDLCALC 92 10/16/2017 1000   Hepatic Function Panel     Component Value Date/Time   PROT 6.8 10/16/2017 1000   ALBUMIN 4.3 10/16/2017 1000   AST 20 10/16/2017 1000   ALT 17 10/16/2017 1000   ALKPHOS 102 10/16/2017 1000   BILITOT 0.7 10/16/2017 1000      Component Value Date/Time   TSH 1.990 11/17/2017 1158   TSH 2.280 10/16/2017 1000   TSH 2.12 11/13/2015 1501  Results for [facility], Eleshia R "KATIE" (MRN [id number]) as of 12/31/2017 12:30  Ref. Range 11/17/2017 11:58  Vitamin D, 25-Hydroxy Latest Ref Range: 30.0 - 100.0 ng/mL 26.2 (L)    ASSESSMENT AND PLAN: Vitamin D deficiency - Plan: Vitamin D, Ergocalciferol, (DRISDOL) 50000 units CAPS capsule  Other hyperlipidemia  At risk for osteoporosis  Class 2 severe obesity with serious comorbidity and body mass index (BMI) of 36.0 to 36.9 in adult, unspecified obesity type (HCC)  PLAN:  Vitamin D Deficiency Donna Vasquez  was informed that low vitamin D levels contributes to fatigue and are associated with obesity, breast, and colon cancer. Donna Vasquez agrees to continue taking prescription Vit D @50 ,000 IU every week #4 and we will refill for 1 month. She will follow up for routine testing of vitamin D, at least 2-3 times per year. She was informed of the risk of over-replacement of vitamin D and agrees to not increase her dose unless she discusses this with [name] first. Donna Vasquez agrees to follow up with our clinic in 2 weeks.  At risk for osteopenia and osteoporosis Donna Vasquez is at risk for osteopenia and osteoporsis due to her vitamin D deficiency. She was encouraged to take her vitamin D and follow her higher calcium diet and increase strengthening exercise to help strengthen her bones and decrease her risk of osteopenia and osteoporosis.  Hyperlipidemia Donna Vasquez was informed of the American Heart Association Guidelines emphasizing intensive lifestyle modifications as the first line treatment for hyperlipidemia. We discussed many lifestyle modifications today in depth, and Donna Vasquez will continue to work on decreasing saturated fats such as fatty red meat, butter and many fried foods. She will also increase vegetables and lean protein in her diet and continue to work on exercise and weight loss efforts. Donna Vasquez agrees to continue taking statin and she agrees to follow up with our clinic in 2 weeks.  Obesity Donna Vasquez is currently in the action stage of change. As such, her goal is to continue with weight loss efforts She has agreed to follow the Category 2 plan Donna Vasquez has been instructed to work up to a goal of 150 minutes of combined cardio and strengthening exercise per week for weight loss and overall health benefits. We discussed the following Behavioral Modification Strategies today: increasing lean protein intake, increasing vegetables and work on meal planning and easy cooking plans   Donna Vasquez has agreed to follow up with  our clinic in 2 weeks. She was informed of the importance of frequent follow up visits to maximize her success with intensive lifestyle modifications for her multiple health conditions.   OBESITY BEHAVIORAL INTERVENTION VISIT  Today's visit was # 4 out of 22.  Starting weight: 248 lbs Starting date: 11/17/17 Today's weight : 242 lbs  Today's date: 12/31/2017 Total lbs lost to date: 6    ASK: We discussed the diagnosis of obesity with Donna Vasquez today and Donna Vasquez agreed to give [name] permission to discuss obesity behavioral modification therapy today.  ASSESS: Donna Vasquez has the diagnosis of obesity and her BMI today is 36.8 Donna Vasquez is in the action stage of change   ADVISE: Donna Vasquez was educated on the multiple health risks of obesity as well as the benefit of weight loss to improve her health. She was advised of the need for long term treatment and the importance of lifestyle modifications.  AGREE: Multiple dietary modification options and treatment options were discussed and  Donna Vasquez agreed to the above obesity treatment plan.  I, Burt Knack, am acting as transcriptionist for Debbra Riding, MD  I have reviewed the above documentation for accuracy and completeness, and I agree with the above. - Debbra Riding, MD

## 2018-01-14 ENCOUNTER — Ambulatory Visit (INDEPENDENT_AMBULATORY_CARE_PROVIDER_SITE_OTHER): Payer: BLUE CROSS/BLUE SHIELD | Admitting: Family Medicine

## 2018-01-14 VITALS — BP 125/78 | HR 69 | Temp 98.3°F | Ht 68.0 in | Wt 238.0 lb

## 2018-01-14 DIAGNOSIS — Z6836 Body mass index (BMI) 36.0-36.9, adult: Secondary | ICD-10-CM

## 2018-01-14 DIAGNOSIS — E559 Vitamin D deficiency, unspecified: Secondary | ICD-10-CM

## 2018-01-14 DIAGNOSIS — F3289 Other specified depressive episodes: Secondary | ICD-10-CM

## 2018-01-15 NOTE — Progress Notes (Signed)
Office: [phone]  /  Fax: [phone]   HPI:   Chief Complaint: OBESITY Donna Vasquez is here to discuss her progress with her obesity treatment plan. She is on the Category 2 plan and is following her eating plan approximately 75 % of the time. She states she is exercising 0 minutes 0 times per week. Donna Vasquez hasn't been feeling well for the past 2 days. Yesterday she had a headache and today she is feeling nauseous.  Her weight is 238 lb (108 kg) today and has had a weight loss of 4 pounds over a period of 2 weeks since her last visit. She has lost 10 lbs since starting treatment with [name].  Vitamin D Deficiency Donna Vasquez has a diagnosis of vitamin D deficiency. She is currently taking prescription Vit D. She notes fatigue and denies nausea, vomiting or muscle weakness.  Depression with emotional eating behaviors Donna Vasquez states she is feeling slightly down today, secondary to kids moving out and her father moving into memory care unit. Donna Vasquez struggles with emotional eating and using food for comfort to the extent that it is negatively impacting her health. She often snacks when she is not hungry. Donna Vasquez sometimes feels she is out of control and then feels guilty that she made poor food choices. She has been working on behavior modification techniques to help reduce her emotional eating and has been somewhat successful. She shows no sign of suicidal or homicidal ideations.  Depression screen Donna Vasquez 11/17/2017 10/16/2017 09/15/2017 02/20/2017 02/05/2017  Decreased Interest 3 1 3  - 0  Down, Depressed, Hopeless 3 1 3  0 0  PHQ - 2 Score 6 2 6  0 0  Altered sleeping 2 2 3  - -  Tired, decreased energy 3 2 3  - -  Change in appetite 3 2 3  - -  Feeling bad or failure about yourself  0 2 3 - -  Trouble concentrating 3 1 3  - -  Moving slowly or fidgety/restless 0 0 0 - -  Suicidal thoughts 0 0 0 - -  PHQ-9 Score 17 11 21  - -  Difficult doing work/chores Somewhat difficult Very difficult Very difficult -  -    ALLERGIES: Allergies  Allergen Reactions  . Flonase [Fluticasone Propionate] Other (See Comments)    Nasal irritation, burning, but can tolerate advair inhaler.    . Sulfa Antibiotics Hives    MEDICATIONS: Current Outpatient Medications on File Prior to Visit  Medication Sig Dispense Refill  . albuterol (PROAIR HFA) 108 (90 Base) MCG/ACT inhaler Inhale 1-2 puffs into the lungs every 6 (six) hours as needed for wheezing or shortness of breath. 1 Inhaler 2  . aspirin 81 MG tablet Take 81 mg by mouth daily.    aspirin-acetaminophen-caffeine (EXCEDRIN MIGRAINE) 250-250-65 MG tablet Take by mouth every 6 (six) hours as needed for headache.    buPROPion (WELLBUTRIN XL) 300 MG 24 hr tablet Take 1 tablet (300 mg total) by mouth daily. 90 tablet 3  . dextromethorphan-guaiFENesin (MUCINEX DM) 30-600 MG 12hr tablet Take 1 tablet by mouth 2 (two) times daily as needed for cough.    . Fluticasone-Salmeterol (ADVAIR) 100-50 MCG/DOSE AEPB Inhale 1 puff into the lungs 2 (two) times daily.    . montelukast (SINGULAIR) 10 MG tablet TAKE 1 TABLET (10 MG TOTAL) BY MOUTH AT BEDTIME. 30 tablet 2  . NEOMYCIN-POLYMYXIN-HYDROCORTISONE (CORTISPORIN) 1 % SOLN OTIC solution Place 3 drops into the right ear 4 (four) times daily. 10 mL 0  . niacin (NIASPAN) 1000  MG CR tablet Take 1 tablet (1,000 mg total) by mouth at bedtime. 90 tablet 1  . omeprazole (PRILOSEC) 20 MG capsule Take 1 capsule (20 mg total) by mouth daily. 30 capsule 2  . sertraline (ZOLOFT) 100 MG tablet TAKE 1 TABLET BY MOUTH EVERY DAY 90 tablet 1  . simvastatin (ZOCOR) 20 MG tablet Take 1 tablet (20 mg total) by mouth daily at 6 PM. 90 tablet 1  . triamcinolone cream (KENALOG) 0.1 % Apply 1 application topically 2 (two) times daily as needed. 45 g 1  . Vitamin D, Ergocalciferol, (DRISDOL) 50000 units CAPS capsule Take 1 capsule (50,000 Units total) by mouth every 7 (seven) days. 4 capsule 0   No current facility-administered medications on  file prior to visit.     PAST MEDICAL HISTORY: Past Medical History:  Diagnosis Date  . Allergy   . Asthma   . Back pain   . Bulging lumbar disc   . Chicken pox   . Constipation   . Depression   . Fatigue   . Frequent headaches   . Gall bladder disease   . Headache   . Heartburn   . Hyperlipidemia   . IBS (irritable bowel syndrome)   . Interstitial cystitis   . RA (rheumatoid arthritis) (HCC)   . Shortness of breath on exertion   . Trouble in sleeping   . Urine incontinence   . Vertigo   . Vision changes   . Vitamin D deficiency     PAST SURGICAL HISTORY: Past Surgical History:  Procedure Laterality Date  . CHOLECYSTECTOMY    . cyst removed from back    . NASAL SINUS SURGERY    . TUBAL LIGATION  1999    SOCIAL HISTORY: Social History   Tobacco Use  . Smoking status: Never Smoker  . Smokeless tobacco: Never Used  Substance Use Topics  . Alcohol use: Yes    Alcohol/week: 0.0 standard drinks    Comment: rare--wine  . Drug use: No    FAMILY HISTORY: Family History  Problem Relation Age of Onset  . COPD Mother   . Heart disease Mother   . Polymyalgia rheumatica Mother   . Emphysema Mother   . Stroke Mother   . Depression Mother   . Anxiety disorder Mother   . Heart disease Father   . Hypertension Father   . Hyperlipidemia Father   . Anxiety disorder Father   . Cancer Paternal Aunt        Breast  . Breast cancer Paternal Aunt 23  . Stroke Maternal Grandmother   . Cancer Maternal Aunt     ROS: Review of Systems  Constitutional: Positive for malaise/fatigue and weight loss.  Gastrointestinal: Negative for nausea and vomiting.  Musculoskeletal:       Negative muscle weakness  Psychiatric/Behavioral: Positive for depression. Negative for suicidal ideas.    PHYSICAL EXAM: Blood pressure 125/78, pulse 69, temperature 98.3 F (36.8 C), temperature source Oral, height 5\' 8"  (1.727 m), weight 238 lb (108 kg), last menstrual period 01/01/2018, SpO2  97 %. Body mass index is 36.19 kg/m. Physical Exam  Constitutional: She is oriented to person, place, and time. She appears well-developed and well-nourished.  Cardiovascular: Normal rate.  Pulmonary/Chest: Effort normal.  Musculoskeletal: Normal range of motion.  Neurological: She is oriented to person, place, and time.  Skin: Skin is warm and dry.  Psychiatric: She has a normal mood and affect. Her behavior is normal.  Vitals reviewed.   RECENT  LABS AND TESTS: BMET    Component Value Date/Time   NA 143 10/16/2017 1000   K 4.3 10/16/2017 1000   CL 105 10/16/2017 1000   CO2 19 (L) 10/16/2017 1000   GLUCOSE 92 10/16/2017 1000   GLUCOSE 93 11/13/2015 1501   BUN 15 10/16/2017 1000   CREATININE 0.92 10/16/2017 1000   CALCIUM 9.4 10/16/2017 1000   GFRNONAA 71 10/16/2017 1000   GFRAA 82 10/16/2017 1000   Lab Results  Component Value Date   HGBA1C 5.1 11/17/2017   HGBA1C 5.0 11/13/2015   Lab Results  Component Value Date   INSULIN 8.7 11/17/2017   CBC    Component Value Date/Time   WBC 6.0 10/16/2017 1000   WBC 8.6 11/13/2015 1501   RBC 4.39 10/16/2017 1000   RBC 4.49 11/13/2015 1501   HGB 13.4 10/16/2017 1000   HCT 40.2 10/16/2017 1000   PLT 229 10/16/2017 1000   MCV 92 10/16/2017 1000   MCH 30.5 10/16/2017 1000   MCHC 33.3 10/16/2017 1000   MCHC 34.0 11/13/2015 1501   RDW 13.3 10/16/2017 1000   LYMPHSABS 1.4 10/16/2017 1000   EOSABS 0.2 10/16/2017 1000   BASOSABS 0.0 10/16/2017 1000   Iron/TIBC/Ferritin/ %Sat No results found for: IRON, TIBC, FERRITIN, IRONPCTSAT Lipid Panel     Component Value Date/Time   CHOL 174 10/16/2017 1000   TRIG 129 10/16/2017 1000   HDL 56 10/16/2017 1000   CHOLHDL 3.1 10/16/2017 1000   CHOLHDL 3 11/13/2015 1501   VLDL 29.8 11/13/2015 1501   LDLCALC 92 10/16/2017 1000   Hepatic Function Panel     Component Value Date/Time   PROT 6.8 10/16/2017 1000   ALBUMIN 4.3 10/16/2017 1000   AST 20 10/16/2017 1000   ALT 17  10/16/2017 1000   ALKPHOS 102 10/16/2017 1000   BILITOT 0.7 10/16/2017 1000      Component Value Date/Time   TSH 1.990 11/17/2017 1158   TSH 2.280 10/16/2017 1000   TSH 2.12 11/13/2015 1501  Results for [facility], Maylee R "KATIE" (MRN [id number]) as of 01/15/2018 09:16  Ref. Range 11/17/2017 11:58  Vitamin D, 25-Hydroxy Latest Ref Range: 30.0 - 100.0 ng/mL 26.2 (L)    ASSESSMENT AND PLAN: Vitamin D deficiency  Other depression - with emotional eating  Class 2 severe obesity with serious comorbidity and body mass index (BMI) of 36.0 to 36.9 in adult, unspecified obesity type (HCC)  PLAN:  Vitamin D Deficiency Nikole was informed that low vitamin D levels contributes to fatigue and are associated with obesity, breast, and colon cancer. Naysha agrees to continue taking prescription Vit D @50 ,000 IU every week, no refill needed. She will follow up for routine testing of vitamin D, at least 2-3 times per year. She was informed of the risk of over-replacement of vitamin D and agrees to not increase her dose unless she discusses this with [name] first. Ortencia agrees to follow up with our clinic in 2 weeks.  Depression with Emotional Eating Behaviors We discussed behavior modification techniques today to help Xareni deal with her emotional eating and depression. Maddy agrees to continue taking Wellbutrin XL and Zoloft, and she agrees to follow up with our clinic in 2 weeks.  I spent > than 50% of the 15 minute visit on counseling as documented in the note.  Obesity Mirari is currently in the action stage of change. As such, her goal is to continue with weight loss efforts She has agreed to follow the Category 2 plan  Charika has been instructed to work up to a goal of 150 minutes of combined cardio and strengthening exercise per week for weight loss and overall health benefits. We discussed the following Behavioral Modification Strategies today: work on meal planning and easy cooking plans,  emotional eating strategies, increase H20 intake, and planning for success   Carolanne has agreed to follow up with our clinic in 2 weeks. She was informed of the importance of frequent follow up visits to maximize her success with intensive lifestyle modifications for her multiple health conditions.   OBESITY BEHAVIORAL INTERVENTION VISIT  Today's visit was # 5   Starting weight: 248 lbs Starting date: 11/17/17 Today's weight : 238 lbs  Today's date: 01/14/2018 Total lbs lost to date: 10    ASK: We discussed the diagnosis of obesity with Jeananne Rama today and Ota agreed to give [name] permission to discuss obesity behavioral modification therapy today.  ASSESS: Eugene has the diagnosis of obesity and her BMI today is 36.2 [place] is in the action stage of change   ADVISE: Bama was educated on the multiple health risks of obesity as well as the benefit of weight loss to improve her health. She was advised of the need for long term treatment and the importance of lifestyle modifications to improve her current health and to decrease her risk of future health problems.  AGREE: Multiple dietary modification options and treatment options were discussed and  Lakesa agreed to follow the recommendations documented in the above note.  ARRANGE: Almira was educated on the importance of frequent visits to treat obesity as outlined per CMS and USPSTF guidelines and agreed to schedule her next follow up appointment today.  I, Burt Knack, am acting as transcriptionist for Debbra Riding, MD  I have reviewed the above documentation for accuracy and completeness, and I agree with the above. - Debbra Riding, MD

## 2018-01-28 ENCOUNTER — Ambulatory Visit (INDEPENDENT_AMBULATORY_CARE_PROVIDER_SITE_OTHER): Payer: BLUE CROSS/BLUE SHIELD | Admitting: Family Medicine

## 2018-01-28 VITALS — BP 126/77 | HR 82 | Temp 98.2°F | Ht 68.0 in | Wt 237.0 lb

## 2018-01-28 DIAGNOSIS — E7849 Other hyperlipidemia: Secondary | ICD-10-CM

## 2018-01-28 DIAGNOSIS — Z9189 Other specified personal risk factors, not elsewhere classified: Secondary | ICD-10-CM | POA: Diagnosis not present

## 2018-01-28 DIAGNOSIS — E559 Vitamin D deficiency, unspecified: Secondary | ICD-10-CM

## 2018-01-28 DIAGNOSIS — Z6836 Body mass index (BMI) 36.0-36.9, adult: Secondary | ICD-10-CM

## 2018-01-29 MED ORDER — VITAMIN D (ERGOCALCIFEROL) 1.25 MG (50000 UNIT) PO CAPS: 50000.0000 [IU] | ORAL_CAPSULE | ORAL | 0 refills | Status: DC

## 2018-02-02 NOTE — Progress Notes (Signed)
Office: [phone]  /  Fax: [phone]   HPI:   Chief Complaint: OBESITY Donna Vasquez is here to discuss her progress with her obesity treatment plan. She is on the Category 2 plan and is following her eating plan approximately 75 % of the time. She states she is exercising 0 minutes 0 times per week. Cheresa's last few weeks were rough. She thinks she may have sabotaged herself. She notes getting harder to eat meat.  Her weight is 237 lb (107.5 kg) today and has had a weight loss of 1 pound over a period of 2 weeks since her last visit. She has lost 11 lbs since starting treatment with [name].  Vitamin D Deficiency Donna Vasquez has a diagnosis of vitamin D deficiency. She is currently taking prescription Vit D. She notes fatigue and denies nausea, vomiting or muscle weakness.  At risk for osteopenia and osteoporosis Donna Vasquez is at higher risk of osteopenia and osteoporosis due to vitamin D deficiency.   Hyperlipidemia Donna Vasquez has hyperlipidemia and has been trying to improve her cholesterol levels with intensive lifestyle modification including a low saturated fat diet, exercise and weight loss. She is on statin and niacin and denies any chest pain, claudication or myalgias.  ALLERGIES: Allergies  Allergen Reactions  . Flonase [Fluticasone Propionate] Other (See Comments)    Nasal irritation, burning, but can tolerate advair inhaler.    . Sulfa Antibiotics Hives    MEDICATIONS: Current Outpatient Medications on File Prior to Visit  Medication Sig Dispense Refill  . albuterol (PROAIR HFA) 108 (90 Base) MCG/ACT inhaler Inhale 1-2 puffs into the lungs every 6 (six) hours as needed for wheezing or shortness of breath. 1 Inhaler 2  . aspirin 81 MG tablet Take 81 mg by mouth daily.    [name] aspirin-acetaminophen-caffeine (EXCEDRIN MIGRAINE) 250-250-65 MG tablet Take by mouth every 6 (six) hours as needed for headache.    [name] buPROPion (WELLBUTRIN XL) 300 MG 24 hr tablet Take 1 tablet (300 mg total) by  mouth daily. 90 tablet 3  . dextromethorphan-guaiFENesin (MUCINEX DM) 30-600 MG 12hr tablet Take 1 tablet by mouth 2 (two) times daily as needed for cough.    . Fluticasone-Salmeterol (ADVAIR) 100-50 MCG/DOSE AEPB Inhale 1 puff into the lungs 2 (two) times daily.    . montelukast (SINGULAIR) 10 MG tablet TAKE 1 TABLET (10 MG TOTAL) BY MOUTH AT BEDTIME. 30 tablet 2  . NEOMYCIN-POLYMYXIN-HYDROCORTISONE (CORTISPORIN) 1 % SOLN OTIC solution Place 3 drops into the right ear 4 (four) times daily. 10 mL 0  . niacin (NIASPAN) 1000 MG CR tablet Take 1 tablet (1,000 mg total) by mouth at bedtime. 90 tablet 1  . omeprazole (PRILOSEC) 20 MG capsule Take 1 capsule (20 mg total) by mouth daily. 30 capsule 2  . sertraline (ZOLOFT) 100 MG tablet TAKE 1 TABLET BY MOUTH EVERY DAY 90 tablet 1  . simvastatin (ZOCOR) 20 MG tablet Take 1 tablet (20 mg total) by mouth daily at 6 PM. 90 tablet 1  . triamcinolone cream (KENALOG) 0.1 % Apply 1 application topically 2 (two) times daily as needed. 45 g 1   No current facility-administered medications on file prior to visit.     PAST MEDICAL HISTORY: Past Medical History:  Diagnosis Date  . Allergy   . Asthma   . Back pain   . Bulging lumbar disc   . Chicken pox   . Constipation   . Depression   . Fatigue   . Frequent headaches   . Gall  bladder disease   . Headache   . Heartburn   . Hyperlipidemia   . IBS (irritable bowel syndrome)   . Interstitial cystitis   . RA (rheumatoid arthritis) (HCC)   . Shortness of breath on exertion   . Trouble in sleeping   . Urine incontinence   . Vertigo   . Vision changes   . Vitamin D deficiency     PAST SURGICAL HISTORY: Past Surgical History:  Procedure Laterality Date  . CHOLECYSTECTOMY    . cyst removed from back    . NASAL SINUS SURGERY    . TUBAL LIGATION  1999    SOCIAL HISTORY: Social History   Tobacco Use  . Smoking status: Never Smoker  . Smokeless tobacco: Never Used  Substance Use Topics  .  Alcohol use: Yes    Alcohol/week: 0.0 standard drinks    Comment: rare--wine  . Drug use: No    FAMILY HISTORY: Family History  Problem Relation Age of Onset  . COPD Mother   . Heart disease Mother   . Polymyalgia rheumatica Mother   . Emphysema Mother   . Stroke Mother   . Depression Mother   . Anxiety disorder Mother   . Heart disease Father   . Hypertension Father   . Hyperlipidemia Father   . Anxiety disorder Father   . Cancer Paternal Aunt        Breast  . Breast cancer Paternal Aunt 55  . Stroke Maternal Grandmother   . Cancer Maternal Aunt     ROS: Review of Systems  Constitutional: Positive for malaise/fatigue and weight loss.  Cardiovascular: Negative for chest pain and claudication.  Gastrointestinal: Negative for nausea and vomiting.  Musculoskeletal: Negative for myalgias.       Negative muscle weakness    PHYSICAL EXAM: Blood pressure 126/77, pulse 82, temperature 98.2 F (36.8 C), temperature source Oral, height 5\' 8"  (1.727 m), weight 237 lb (107.5 kg), last menstrual period 01/01/2018, SpO2 96 %. Body mass index is 36.04 kg/m. Physical Exam  Constitutional: She is oriented to person, place, and time. She appears well-developed and well-nourished.  Cardiovascular: Normal rate.  Pulmonary/Chest: Effort normal.  Musculoskeletal: Normal range of motion.  Neurological: She is oriented to person, place, and time.  Skin: Skin is warm and dry.  Psychiatric: She has a normal mood and affect. Her behavior is normal.  Vitals reviewed.   RECENT LABS AND TESTS: BMET    Component Value Date/Time   NA 143 10/16/2017 1000   K 4.3 10/16/2017 1000   CL 105 10/16/2017 1000   CO2 19 (L) 10/16/2017 1000   GLUCOSE 92 10/16/2017 1000   GLUCOSE 93 11/13/2015 1501   BUN 15 10/16/2017 1000   CREATININE 0.92 10/16/2017 1000   CALCIUM 9.4 10/16/2017 1000   GFRNONAA 71 10/16/2017 1000   GFRAA 82 10/16/2017 1000   Lab Results  Component Value Date   HGBA1C 5.1  11/17/2017   HGBA1C 5.0 11/13/2015   Lab Results  Component Value Date   INSULIN 8.7 11/17/2017   CBC    Component Value Date/Time   WBC 6.0 10/16/2017 1000   WBC 8.6 11/13/2015 1501   RBC 4.39 10/16/2017 1000   RBC 4.49 11/13/2015 1501   HGB 13.4 10/16/2017 1000   HCT 40.2 10/16/2017 1000   PLT 229 10/16/2017 1000   MCV 92 10/16/2017 1000   MCH 30.5 10/16/2017 1000   MCHC 33.3 10/16/2017 1000   MCHC 34.0 11/13/2015 1501  RDW 13.3 10/16/2017 1000   LYMPHSABS 1.4 10/16/2017 1000   EOSABS 0.2 10/16/2017 1000   BASOSABS 0.0 10/16/2017 1000   Iron/TIBC/Ferritin/ %Sat No results found for: IRON, TIBC, FERRITIN, IRONPCTSAT Lipid Panel     Component Value Date/Time   CHOL 174 10/16/2017 1000   TRIG 129 10/16/2017 1000   HDL 56 10/16/2017 1000   CHOLHDL 3.1 10/16/2017 1000   CHOLHDL 3 11/13/2015 1501   VLDL 29.8 11/13/2015 1501   LDLCALC 92 10/16/2017 1000   Hepatic Function Panel     Component Value Date/Time   PROT 6.8 10/16/2017 1000   ALBUMIN 4.3 10/16/2017 1000   AST 20 10/16/2017 1000   ALT 17 10/16/2017 1000   ALKPHOS 102 10/16/2017 1000   BILITOT 0.7 10/16/2017 1000      Component Value Date/Time   TSH 1.990 11/17/2017 1158   TSH 2.280 10/16/2017 1000   TSH 2.12 11/13/2015 1501  Results for [facility], [place] R "KATIE" (MRN [id number]) as of 02/02/2018 08:12  Ref. Range 11/17/2017 11:58  Vitamin D, 25-Hydroxy Latest Ref Range: 30.0 - 100.0 ng/mL 26.2 (L)    ASSESSMENT AND PLAN: Vitamin D deficiency - Plan: Vitamin D, Ergocalciferol, (DRISDOL) 50000 units CAPS capsule  Other hyperlipidemia  At risk for osteoporosis  Class 2 severe obesity with serious comorbidity and body mass index (BMI) of 36.0 to 36.9 in adult, unspecified obesity type (HCC)  PLAN:  Vitamin D Deficiency Donna Vasquez was informed that low vitamin D levels contributes to fatigue and are associated with obesity, breast, and colon cancer. Donna Vasquez agrees to continue taking prescription Vit D  @50 ,000 IU every week #4 and we will refill for 1 month. She will follow up for routine testing of vitamin D, at least 2-3 times per year. She was informed of the risk of over-replacement of vitamin D and agrees to not increase her dose unless she discusses this with first. Donna Vasquez agrees to follow up with our clinic in 2 weeks.  At risk for osteopenia and osteoporosis Donna Vasquez was given extended  (15 minutes) osteoporosis prevention counseling today. Donna Vasquez is at risk for osteopenia and osteoporsis due to her vitamin D deficiency. She was encouraged to take her vitamin D and follow her higher calcium diet and increase strengthening exercise to help strengthen her bones and decrease her risk of osteopenia and osteoporosis.  Hyperlipidemia Donna Vasquez was informed of the American Heart Association Guidelines emphasizing intensive lifestyle modifications as the first line treatment for hyperlipidemia. We discussed many lifestyle modifications today in depth, and Donna Vasquez will continue to work on decreasing saturated fats such as fatty red meat, butter and many fried foods. Sharon agrees to continue taking her current medications, she will also increase vegetables and lean protein in her diet and continue to work on exercise and weight loss efforts. Donna Vasquez agrees to follow up with our clinic in 2 weeks.  Obesity Donna Vasquez is currently in the action stage of change. As such, her goal is to continue with weight loss efforts She has agreed to keep a food journal with 400-500 calories and 30+ grams of protein at supper daily and follow the Category 2 plan Donna Vasquez has been instructed to work up to a goal of 150 minutes of combined cardio and strengthening exercise per week for weight loss and overall health benefits. We discussed the following Behavioral Modification Strategies today: increasing lean protein intake, increasing vegetables, work on meal planning and easy cooking plans, better snacking choices, and  keep a strict food journal  Donna Vasquez has agreed to follow up with our clinic in 2 weeks. She was informed of the importance of frequent follow up visits to maximize her success with intensive lifestyle modifications for her multiple health conditions.   OBESITY BEHAVIORAL INTERVENTION VISIT  Today's visit was # 6   Starting weight: 248 lbs Starting date: 11/17/17 Today's weight : 237 lbs Today's date: 01/28/2018 Total lbs lost to date: 11    ASK: We discussed the diagnosis of obesity with Donna Vasquez Rama today and [employer] agreed to give [name] permission to discuss obesity behavioral modification therapy today.  ASSESS: Donna Vasquez has the diagnosis of obesity and her BMI today is 36.04 Donna Vasquez is in the action stage of change   ADVISE: Donna Vasquez was educated on the multiple health risks of obesity as well as the benefit of weight loss to improve her health. She was advised of the need for long term treatment and the importance of lifestyle modifications to improve her current health and to decrease her risk of future health problems.  AGREE: Multiple dietary modification options and treatment options were discussed and  Donna Vasquez agreed to follow the recommendations documented in the above note.  ARRANGE: Donna Vasquez was educated on the importance of frequent visits to treat obesity as outlined per CMS and USPSTF guidelines and agreed to schedule her next follow up appointment today.  I, Burt Knack, am acting as transcriptionist for Debbra Riding, MD  I have reviewed the above documentation for accuracy and completeness, and I agree with the above. - Debbra Riding, MD

## 2018-02-11 ENCOUNTER — Ambulatory Visit (INDEPENDENT_AMBULATORY_CARE_PROVIDER_SITE_OTHER): Payer: BLUE CROSS/BLUE SHIELD | Admitting: Family Medicine

## 2018-02-11 VITALS — BP 136/86 | HR 74 | Temp 98.0°F | Ht 68.0 in | Wt 236.0 lb

## 2018-02-11 DIAGNOSIS — Z6835 Body mass index (BMI) 35.0-35.9, adult: Secondary | ICD-10-CM

## 2018-02-11 DIAGNOSIS — E559 Vitamin D deficiency, unspecified: Secondary | ICD-10-CM | POA: Diagnosis not present

## 2018-02-11 DIAGNOSIS — E7849 Other hyperlipidemia: Secondary | ICD-10-CM | POA: Diagnosis not present

## 2018-02-11 DIAGNOSIS — Z9189 Other specified personal risk factors, not elsewhere classified: Secondary | ICD-10-CM | POA: Diagnosis not present

## 2018-02-12 MED ORDER — VITAMIN D (ERGOCALCIFEROL) 1.25 MG (50000 UNIT) PO CAPS: 50000.0000 [IU] | ORAL_CAPSULE | ORAL | 0 refills | Status: DC

## 2018-02-12 NOTE — Progress Notes (Signed)
Office: [phone]  /  Fax: [phone]   HPI:   Chief Complaint: OBESITY Donna Vasquez is here to discuss her progress with her obesity treatment plan. She is on the keep a food journal with 400-500 calories and 30+ grams of protein at supper daily and follow the Category 2 plan and is following her eating plan approximately 85-90 % of the time. She states she is exercising 0 minutes 0 times per week. Donna Vasquez is still hungry especially between breakfast and lunch. She notes she is still cravings sweets.  Her weight is 236 lb (107 kg) today and has had a weight loss of 1 pound over a period of 2 weeks since her last visit. She has lost 12 lbs since starting treatment with [name].  Vitamin D Deficiency Donna Vasquez has a diagnosis of vitamin D deficiency. She is currently taking prescription Vit D. She notes fatigue and denies nausea, vomiting or muscle weakness.  At risk for osteopenia and osteoporosis Donna Vasquez is at higher risk of osteopenia and osteoporosis due to vitamin D deficiency.   Hyperlipidemia Donna Vasquez has hyperlipidemia and has been trying to improve her cholesterol levels with intensive lifestyle modification including a low saturated fat diet, exercise and weight loss. Her LDL was of 92 on her last labs. She is on simvastatin and denies any chest pain, claudication or myalgias.  ALLERGIES: Allergies  Allergen Reactions  . Flonase [Fluticasone Propionate] Other (See Comments)    Nasal irritation, burning, but can tolerate advair inhaler.    . Sulfa Antibiotics Hives    MEDICATIONS: Current Outpatient Medications on File Prior to Visit  Medication Sig Dispense Refill  . albuterol (PROAIR HFA) 108 (90 Base) MCG/ACT inhaler Inhale 1-2 puffs into the lungs every 6 (six) hours as needed for wheezing or shortness of breath. 1 Inhaler 2  . aspirin 81 MG tablet Take 81 mg by mouth daily.    [name] aspirin-acetaminophen-caffeine (EXCEDRIN MIGRAINE) 250-250-65 MG tablet Take by mouth every 6  (six) hours as needed for headache.    [name] buPROPion (WELLBUTRIN XL) 300 MG 24 hr tablet Take 1 tablet (300 mg total) by mouth daily. 90 tablet 3  . dextromethorphan-guaiFENesin (MUCINEX DM) 30-600 MG 12hr tablet Take 1 tablet by mouth 2 (two) times daily as needed for cough.    . Fluticasone-Salmeterol (ADVAIR) 100-50 MCG/DOSE AEPB Inhale 1 puff into the lungs 2 (two) times daily.    . montelukast (SINGULAIR) 10 MG tablet TAKE 1 TABLET (10 MG TOTAL) BY MOUTH AT BEDTIME. 30 tablet 2  . NEOMYCIN-POLYMYXIN-HYDROCORTISONE (CORTISPORIN) 1 % SOLN OTIC solution Place 3 drops into the right ear 4 (four) times daily. 10 mL 0  . niacin (NIASPAN) 1000 MG CR tablet Take 1 tablet (1,000 mg total) by mouth at bedtime. 90 tablet 1  . omeprazole (PRILOSEC) 20 MG capsule Take 1 capsule (20 mg total) by mouth daily. 30 capsule 2  . sertraline (ZOLOFT) 100 MG tablet TAKE 1 TABLET BY MOUTH EVERY DAY 90 tablet 1  . simvastatin (ZOCOR) 20 MG tablet Take 1 tablet (20 mg total) by mouth daily at 6 PM. 90 tablet 1  . triamcinolone cream (KENALOG) 0.1 % Apply 1 application topically 2 (two) times daily as needed. 45 g 1   No current facility-administered medications on file prior to visit.     PAST MEDICAL HISTORY: Past Medical History:  Diagnosis Date  . Allergy   . Asthma   . Back pain   . Bulging lumbar disc   . Chicken pox   .  Constipation   . Depression   . Fatigue   . Frequent headaches   . Gall bladder disease   . Headache   . Heartburn   . Hyperlipidemia   . IBS (irritable bowel syndrome)   . Interstitial cystitis   . RA (rheumatoid arthritis) (HCC)   . Shortness of breath on exertion   . Trouble in sleeping   . Urine incontinence   . Vertigo   . Vision changes   . Vitamin D deficiency     PAST SURGICAL HISTORY: Past Surgical History:  Procedure Laterality Date  . CHOLECYSTECTOMY    . cyst removed from back    . NASAL SINUS SURGERY    . TUBAL LIGATION  1999    SOCIAL HISTORY: Social  History   Tobacco Use  . Smoking status: Never Smoker  . Smokeless tobacco: Never Used  Substance Use Topics  . Alcohol use: Yes    Alcohol/week: 0.0 standard drinks    Comment: rare--wine  . Drug use: No    FAMILY HISTORY: Family History  Problem Relation Age of Onset  . COPD Mother   . Heart disease Mother   . Polymyalgia rheumatica Mother   . Emphysema Mother   . Stroke Mother   . Depression Mother   . Anxiety disorder Mother   . Heart disease Father   . Hypertension Father   . Hyperlipidemia Father   . Anxiety disorder Father   . Cancer Paternal Aunt        Breast  . Breast cancer Paternal Aunt 53  . Stroke Maternal Grandmother   . Cancer Maternal Aunt     ROS: Review of Systems  Constitutional: Positive for malaise/fatigue and weight loss.  Cardiovascular: Negative for chest pain and claudication.  Gastrointestinal: Negative for nausea and vomiting.  Musculoskeletal: Negative for myalgias.       Negative muscle weakness    PHYSICAL EXAM: Blood pressure 136/86, pulse 74, temperature 98 F (36.7 C), temperature source Oral, height 5\' 8"  (1.727 m), weight 236 lb (107 kg), last menstrual period 12/11/2017, SpO2 96 %. Body mass index is 35.88 kg/m. Physical Exam  Constitutional: She is oriented to person, place, and time. She appears well-developed and well-nourished.  Cardiovascular: Normal rate.  Pulmonary/Chest: Effort normal.  Musculoskeletal: Normal range of motion.  Neurological: She is oriented to person, place, and time.  Skin: Skin is warm and dry.  Psychiatric: She has a normal mood and affect. Her behavior is normal.  Vitals reviewed.   RECENT LABS AND TESTS: BMET    Component Value Date/Time   NA 143 10/16/2017 1000   K 4.3 10/16/2017 1000   CL 105 10/16/2017 1000   CO2 19 (L) 10/16/2017 1000   GLUCOSE 92 10/16/2017 1000   GLUCOSE 93 11/13/2015 1501   BUN 15 10/16/2017 1000   CREATININE 0.92 10/16/2017 1000   CALCIUM 9.4 10/16/2017  1000   GFRNONAA 71 10/16/2017 1000   GFRAA 82 10/16/2017 1000   Lab Results  Component Value Date   HGBA1C 5.1 11/17/2017   HGBA1C 5.0 11/13/2015   Lab Results  Component Value Date   INSULIN 8.7 11/17/2017   CBC    Component Value Date/Time   WBC 6.0 10/16/2017 1000   WBC 8.6 11/13/2015 1501   RBC 4.39 10/16/2017 1000   RBC 4.49 11/13/2015 1501   HGB 13.4 10/16/2017 1000   HCT 40.2 10/16/2017 1000   PLT 229 10/16/2017 1000   MCV 92 10/16/2017 1000  MCH 30.5 10/16/2017 1000   MCHC 33.3 10/16/2017 1000   MCHC 34.0 11/13/2015 1501   RDW 13.3 10/16/2017 1000   LYMPHSABS 1.4 10/16/2017 1000   EOSABS 0.2 10/16/2017 1000   BASOSABS 0.0 10/16/2017 1000   Iron/TIBC/Ferritin/ %Sat No results found for: IRON, TIBC, FERRITIN, IRONPCTSAT Lipid Panel     Component Value Date/Time   CHOL 174 10/16/2017 1000   TRIG 129 10/16/2017 1000   HDL 56 10/16/2017 1000   CHOLHDL 3.1 10/16/2017 1000   CHOLHDL 3 11/13/2015 1501   VLDL 29.8 11/13/2015 1501   LDLCALC 92 10/16/2017 1000   Hepatic Function Panel     Component Value Date/Time   PROT 6.8 10/16/2017 1000   ALBUMIN 4.3 10/16/2017 1000   AST 20 10/16/2017 1000   ALT 17 10/16/2017 1000   ALKPHOS 102 10/16/2017 1000   BILITOT 0.7 10/16/2017 1000      Component Value Date/Time   TSH 1.990 11/17/2017 1158   TSH 2.280 10/16/2017 1000   TSH 2.12 11/13/2015 1501  Results for [facility], [place] R "KATIE" (MRN [id number]) as of 02/12/2018 12:34  Ref. Range 11/17/2017 11:58  Vitamin D, 25-Hydroxy Latest Ref Range: 30.0 - 100.0 ng/mL 26.2 (L)    ASSESSMENT AND PLAN: Vitamin D deficiency - Plan: Vitamin D, Ergocalciferol, (DRISDOL) 50000 units CAPS capsule  Other hyperlipidemia  At risk for osteoporosis  Class 2 severe obesity with serious comorbidity and body mass index (BMI) of 35.0 to 35.9 in adult, unspecified obesity type (HCC)  PLAN:  Vitamin D Deficiency Donna Vasquez was informed that low vitamin D levels contributes to  fatigue and are associated with obesity, breast, and colon cancer. Donna Vasquez agrees to continue taking prescription Vit D @50 ,000 IU every week #12 (90 days) with no refills. She will follow up for routine testing of vitamin D, at least 2-3 times per year. She was informed of the risk of over-replacement of vitamin D and agrees to not increase her dose unless she discusses this with first. Saniya agrees to follow up with our clinic in 2 weeks.  At risk for osteopenia and osteoporosis Donna Vasquez was given extended  (15 minutes) osteoporosis prevention counseling today. Donna Vasquez is at risk for osteopenia and osteoporsis due to her vitamin D deficiency. She was encouraged to take her vitamin D and follow her higher calcium diet and increase strengthening exercise to help strengthen her bones and decrease her risk of osteopenia and osteoporosis.  Hyperlipidemia Donna Vasquez was informed of the American Heart Association Guidelines emphasizing intensive lifestyle modifications as the first line treatment for hyperlipidemia. We discussed many lifestyle modifications today in depth, and Donna Vasquez will continue to work on decreasing saturated fats such as fatty red meat, butter and many fried foods. She will also increase vegetables and lean protein in her diet and continue to work on exercise and weight loss efforts. Donna Vasquez agrees to continue her medications and we will repeat labs at next appointment. Donna Vasquez agrees to follow up with our clinic in 2 weeks.  Obesity Donna Vasquez is currently in the action stage of change. As such, her goal is to continue with weight loss efforts She has agreed to follow the Category 3 plan Donna Vasquez has been instructed to work up to a goal of 150 minutes of combined cardio and strengthening exercise per week for weight loss and overall health benefits. We discussed the following Behavioral Modification Strategies today: increasing lean protein intake, increasing vegetables, work on meal  planning and easy cooking plans, and planning for success  Donna Vasquez has agreed to follow up with our clinic in 2 weeks. She was informed of the importance of frequent follow up visits to maximize her success with intensive lifestyle modifications for her multiple health conditions.   OBESITY BEHAVIORAL INTERVENTION VISIT  Today's visit was # 7   Starting weight: 248 lbs Starting date: 11/17/17 Today's weight : 236 lbs  Today's date: 02/11/2018 Total lbs lost to date: 12    ASK: We discussed the diagnosis of obesity with Donna Vasquez today and Donna Vasquez agreed to give [name] permission to discuss obesity behavioral modification therapy today.  ASSESS: Donna Vasquez has the diagnosis of obesity and her BMI today is 35.89 Donna Vasquez is in the action stage of change   ADVISE: Donna Vasquez was educated on the multiple health risks of obesity as well as the benefit of weight loss to improve her health. She was advised of the need for long term treatment and the importance of lifestyle modifications to improve her current health and to decrease her risk of future health problems.  AGREE: Multiple dietary modification options and treatment options were discussed and  Donna Vasquez agreed to follow the recommendations documented in the above note.  ARRANGE: Donna Vasquez was educated on the importance of frequent visits to treat obesity as outlined per CMS and USPSTF guidelines and agreed to schedule her next follow up appointment today.  I, Burt Knack, am acting as transcriptionist for Debbra Riding, MD  I have reviewed the above documentation for accuracy and completeness, and I agree with the above. - Debbra Riding, MD

## 2018-02-14 ENCOUNTER — Other Ambulatory Visit: Payer: Self-pay | Admitting: Family Medicine

## 2018-02-25 ENCOUNTER — Ambulatory Visit (INDEPENDENT_AMBULATORY_CARE_PROVIDER_SITE_OTHER): Payer: BLUE CROSS/BLUE SHIELD | Admitting: Family Medicine

## 2018-02-25 VITALS — BP 129/76 | HR 76 | Temp 98.1°F | Ht 68.0 in | Wt 236.0 lb

## 2018-02-25 DIAGNOSIS — F3289 Other specified depressive episodes: Secondary | ICD-10-CM | POA: Diagnosis not present

## 2018-02-25 DIAGNOSIS — E559 Vitamin D deficiency, unspecified: Secondary | ICD-10-CM

## 2018-02-25 DIAGNOSIS — Z6835 Body mass index (BMI) 35.0-35.9, adult: Secondary | ICD-10-CM

## 2018-02-25 DIAGNOSIS — Z9189 Other specified personal risk factors, not elsewhere classified: Secondary | ICD-10-CM

## 2018-02-26 ENCOUNTER — Ambulatory Visit: Payer: BLUE CROSS/BLUE SHIELD | Admitting: Family Medicine

## 2018-02-26 ENCOUNTER — Encounter: Payer: Self-pay | Admitting: Family Medicine

## 2018-02-26 VITALS — BP 126/78 | HR 74 | Temp 98.7°F | Ht 68.0 in | Wt 233.8 lb

## 2018-02-26 DIAGNOSIS — G4700 Insomnia, unspecified: Secondary | ICD-10-CM

## 2018-02-26 DIAGNOSIS — F5104 Psychophysiologic insomnia: Secondary | ICD-10-CM | POA: Diagnosis not present

## 2018-02-26 DIAGNOSIS — G44229 Chronic tension-type headache, not intractable: Secondary | ICD-10-CM | POA: Diagnosis not present

## 2018-02-26 DIAGNOSIS — R51 Headache: Secondary | ICD-10-CM

## 2018-02-26 DIAGNOSIS — R0683 Snoring: Secondary | ICD-10-CM | POA: Diagnosis not present

## 2018-02-26 DIAGNOSIS — R519 Headache, unspecified: Secondary | ICD-10-CM | POA: Insufficient documentation

## 2018-02-26 MED ORDER — TRAZODONE HCL 50 MG PO TABS: 25.0000 mg | ORAL_TABLET | Freq: Every evening | ORAL | 3 refills | Status: DC | PRN

## 2018-02-26 NOTE — Assessment & Plan Note (Signed)
>  25 min spent with patient, at least half of which was spent on counseling insomnia, headaches, and snoring.  The problem of recurrent insomnia is discussed. Avoidance of caffeine sources is strongly encouraged. Sleep hygiene issues are reviewed. eRx sent for trazodone- 50 mg tablet- take 1/2- 1 tablet nightly as needed for insomnia.  Refer to pulmonary for sleep study. Follow up in 3 months. The patient indicates understanding of these issues and agrees with the plan.

## 2018-02-26 NOTE — Patient Instructions (Addendum)
Great to see you.  Please keep a headache journal for me.  Trazodone 25- 50 mg nightly for insomnia.  I am referring you to a pulmonologist for a sleep study.

## 2018-02-26 NOTE — Assessment & Plan Note (Signed)
New- most consistent with tension HA although could be a migrainous component.  She will keep a headache journal. Follow up in 3 months.

## 2018-02-26 NOTE — Progress Notes (Signed)
Subjective:   Patient ID: Donna Vasquez, female    DOB: 25-Sep-1965, 52 y.o.   MRN: [id number]  Donna Vasquez is a pleasant 52 y.o. year old female who presents to clinic today with Headache (Patient is here todayC/O H/A's that have been intermittent for the past few months.  States that in the past while in school she had them quite frequently.  Thinks that they may have been stress induced.  She cannot pinpoint anything.  Has had 2 in the past month which is unusual now. 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  When she has these headaches they cause sound sensitivity)  on 02/26/2018  HPI:  Headaches- intermittent for past few months.   No photophobia, nausea or vomiting.  No visual.  Some phonophobia. In the past month, she has had two of these headaches.  Excedrin migraine does alleviate the pain.  Frontal, bilateral. Does have family history of migraines.  She feels these headaches are triggered from lack of sleep.  Has not been sleeping well during the past several months.   Used to take klonipin for sleep.  Has been under more stress at work.  [facility]. Wt Readings from Last 3 Encounters:  02/26/18 233 lb 12.8 oz (106.1 kg)  02/25/18 236 lb (107 kg)  02/11/18 236 lb (107 kg)    She has been snoring more and she is not sure but does think maybe she stops breathing at times.   Current Outpatient Medications on File Prior to Visit  Medication Sig Dispense Refill  . albuterol (PROAIR HFA) 108 (90 Base) MCG/ACT inhaler Inhale 1-2 puffs into the lungs every 6 (six) hours as needed for wheezing or shortness of breath. 1 Inhaler 2  . aspirin 81 MG tablet Take 81 mg by mouth daily.    [name] aspirin-acetaminophen-caffeine (EXCEDRIN MIGRAINE) 250-250-65 MG tablet Take by mouth every 6 (six) hours as needed for headache.    [name] buPROPion  (WELLBUTRIN XL) 300 MG 24 hr tablet Take 1 tablet (300 mg total) by mouth daily. 90 tablet 3  . dextromethorphan-guaiFENesin (MUCINEX DM) 30-600 MG 12hr tablet Take 1 tablet by mouth 2 (two) times daily as needed for cough.    . Fluticasone-Salmeterol (ADVAIR) 100-50 MCG/DOSE AEPB Inhale 1 puff into the lungs 2 (two) times daily.    . montelukast (SINGULAIR) 10 MG tablet TAKE 1 TABLET (10 MG TOTAL) BY MOUTH AT BEDTIME. 30 tablet 2  . niacin (NIASPAN) 1000 MG CR tablet TAKE 1 TABLET BY MOUTH AT  BEDTIME 90 tablet 1  . omeprazole (PRILOSEC) 20 MG capsule Take 1 capsule (20 mg total) by mouth daily. 30 capsule 2  . sertraline (ZOLOFT) 100 MG tablet TAKE 1 TABLET BY MOUTH  DAILY 90 tablet 1  . simvastatin (ZOCOR) 20 MG tablet TAKE 1 TABLET BY MOUTH  DAILY AT 6 PM. 90 tablet 1  . triamcinolone cream (KENALOG) 0.1 % Apply 1 application topically 2 (two) times daily as needed. 45 g 1  . Vitamin D, Ergocalciferol, (DRISDOL) 50000 units CAPS capsule Take 1 capsule (50,000 Units total) by mouth every 7 (seven) days. 12 capsule 0   No current facility-administered medications on file prior to visit.     Allergies  Allergen Reactions  . Flonase [Fluticasone Propionate] Other (See Comments)    Nasal  irritation, burning, but can tolerate advair inhaler.    . Sulfa Antibiotics Hives    Past Medical History:  Diagnosis Date  . Allergy   . Asthma   . Back pain   . Bulging lumbar disc   . Chicken pox   . Constipation   . Depression   . Fatigue   . Frequent headaches   . Gall bladder disease   . Headache   . Heartburn   . Hyperlipidemia   . IBS (irritable bowel syndrome)   . Interstitial cystitis   . RA (rheumatoid arthritis) (HCC)   . Shortness of breath on exertion   . Trouble in sleeping   . Urine incontinence   . Vertigo   . Vision changes   . Vitamin D deficiency     Past Surgical History:  Procedure Laterality Date  . CHOLECYSTECTOMY    . cyst removed from back    . NASAL SINUS  SURGERY    . TUBAL LIGATION  1999    Family History  Problem Relation Age of Onset  . COPD Mother   . Heart disease Mother   . Polymyalgia rheumatica Mother   . Emphysema Mother   . Stroke Mother   . Depression Mother   . Anxiety disorder Mother   . Heart disease Father   . Hypertension Father   . Hyperlipidemia Father   . Anxiety disorder Father   . Cancer Paternal Aunt        Breast  . Breast cancer Paternal Aunt 16  . Stroke Maternal Grandmother   . Cancer Maternal Aunt     Social History   Socioeconomic History  . Marital status: Married    Spouse name: Jesusita Oka  . Number of children: 2  . Years of education: Not on file  . Highest education level: Not on file  Occupational History  . Occupation: Editor, commissioning  Social Needs  . Financial resource strain: Not on file  . Food insecurity:    Worry: Not on file    Inability: Not on file  . Transportation needs:    Medical: Not on file    Non-medical: Not on file  Tobacco Use  . Smoking status: Never Smoker  . Smokeless tobacco: Never Used  Substance and Sexual Activity  . Alcohol use: Yes    Alcohol/week: 0.0 standard drinks    Comment: rare--wine  . Drug use: No  . Sexual activity: Not on file  Lifestyle  . Physical activity:    Days per week: Not on file    Minutes per session: Not on file  . Stress: Not on file  Relationships  . Social connections:    Talks on phone: Not on file    Gets together: Not on file    Attends religious service: Not on file    Active member of club or organization: Not on file    Attends meetings of clubs or organizations: Not on file    Relationship status: Not on file  . Intimate partner violence:    Fear of current or ex partner: Not on file    Emotionally abused: Not on file    Physically abused: Not on file    Forced sexual activity: Not on file  Other Topics Concern  . Not on file  Social History Narrative  . Not on file   The PMH, PSH, Social  History, Family History, Medications, and allergies have been reviewed in [facility], and have been updated if relevant.  Review of Systems  Constitutional: Negative.   HENT: Negative.   Respiratory: Negative.   Cardiovascular: Negative.   Gastrointestinal: Negative.   Endocrine: Negative.   Musculoskeletal: Negative.   Skin: Negative.   Neurological: Positive for headaches. Negative for dizziness, tremors, seizures, syncope, facial asymmetry, speech difficulty, weakness, light-headedness and numbness.  Psychiatric/Behavioral: Positive for sleep disturbance. Negative for agitation, behavioral problems, confusion, decreased concentration, dysphoric mood, hallucinations, self-injury and suicidal ideas. The patient is not nervous/anxious and is not hyperactive.   All other systems reviewed and are negative.      Objective:    BP 126/78 (BP Location: Left Arm, Patient Position: Sitting, Cuff Size: Normal)   Pulse 74   Temp 98.7 F (37.1 C) (Oral)   Ht 5\' 8"  (1.727 m)   Wt 233 lb 12.8 oz (106.1 kg)   LMP 12/11/2017   SpO2 97%   BMI 35.55 kg/m    Physical Exam  Constitutional: She is oriented to person, place, and time. She appears well-developed and well-nourished.  Non-toxic appearance. She does not appear ill. No distress.  HENT:  Head: Normocephalic and atraumatic.  Eyes: Pupils are equal, round, and reactive to light.  Neck: Normal range of motion.  Cardiovascular: Normal rate and regular rhythm.  Pulmonary/Chest: Effort normal and breath sounds normal.  Musculoskeletal: Normal range of motion.  Neurological: She is alert and oriented to person, place, and time. She has normal strength. She is not disoriented. No cranial nerve deficit.  Skin: Skin is warm and dry.  Psychiatric: She has a normal mood and affect. Her behavior is normal.  Nursing note and vitals reviewed.         Assessment & Plan:   Insomnia, unspecified type  Chronic insomnia  Snoring - Plan:  Ambulatory referral to Pulmonology Return in about 3 months (around 05/29/2018) for medication follow up.07/28/2018

## 2018-03-02 NOTE — Progress Notes (Signed)
Office: [phone]  /  Fax: [phone]   HPI:   Chief Complaint: OBESITY Donna Vasquez is here to discuss her progress with her obesity treatment plan. She is on the Category 3 plan and is following her eating plan approximately 80-85 % of the time. She states she is exercising 0 minutes 0 times per week. Tayia has noticed she is having a hard time staying on the plan, and has increase in sweet consumption. She notes increased emotional eating also poor sleep quality.  Her weight is 236 lb (107 kg) today and has not lost weight since her last visit. She has lost 15 lbs since starting treatment with [name].  Vitamin D Deficiency Donna Vasquez has a diagnosis of vitamin D deficiency. She is currently taking prescription Vit D. She notes fatigue and denies nausea, vomiting or muscle weakness.  At risk for osteopenia and osteoporosis Donna Vasquez is at higher risk of osteopenia and osteoporosis due to vitamin D deficiency.   Depression with emotional eating behaviors Donna Vasquez is on Wellbutrin and Zoloft, and has noticed increase in emotional eating and stress eating. She feels somewhat guilty if she is conscious of eating. Donna Vasquez struggles with emotional eating and using food for comfort to the extent that it is negatively impacting her health. She often snacks when she is not hungry. Donna Vasquez sometimes feels she is out of control and then feels guilty that she made poor food choices. She has been working on behavior modification techniques to help reduce her emotional eating and has been somewhat successful. She shows no sign of suicidal or homicidal ideations.  Depression screen [facility] 2/9 11/17/2017 10/16/2017 09/15/2017 02/20/2017 02/05/2017  Decreased Interest 3 1 3  - 0  Down, Depressed, Hopeless 3 1 3  0 0  PHQ - 2 Score 6 2 6  0 0  Altered sleeping 2 2 3  - -  Tired, decreased energy 3 2 3  - -  Change in appetite 3 2 3  - -  Feeling bad or failure about yourself  0 2 3 - -  Trouble concentrating 3 1 3  - -    Moving slowly or fidgety/restless 0 0 0 - -  Suicidal thoughts 0 0 0 - -  PHQ-9 Score 17 11 21  - -  Difficult doing work/chores Somewhat difficult Very difficult Very difficult - -    ALLERGIES: Allergies  Allergen Reactions  . Flonase [Fluticasone Propionate] Other (See Comments)    Nasal irritation, burning, but can tolerate advair inhaler.    . Sulfa Antibiotics Hives    MEDICATIONS: Current Outpatient Medications on File Prior to Visit  Medication Sig Dispense Refill  . albuterol (PROAIR HFA) 108 (90 Base) MCG/ACT inhaler Inhale 1-2 puffs into the lungs every 6 (six) hours as needed for wheezing or shortness of breath. 1 Inhaler 2  . aspirin 81 MG tablet Take 81 mg by mouth daily.    aspirin-acetaminophen-caffeine (EXCEDRIN MIGRAINE) 250-250-65 MG tablet Take by mouth every 6 (six) hours as needed for headache.    buPROPion (WELLBUTRIN XL) 300 MG 24 hr tablet Take 1 tablet (300 mg total) by mouth daily. 90 tablet 3  . dextromethorphan-guaiFENesin (MUCINEX DM) 30-600 MG 12hr tablet Take 1 tablet by mouth 2 (two) times daily as needed for cough.    . Fluticasone-Salmeterol (ADVAIR) 100-50 MCG/DOSE AEPB Inhale 1 puff into the lungs 2 (two) times daily.    . montelukast (SINGULAIR) 10 MG tablet TAKE 1 TABLET (10 MG TOTAL) BY MOUTH AT BEDTIME. 30 tablet 2  . niacin (  NIASPAN) 1000 MG CR tablet TAKE 1 TABLET BY MOUTH AT  BEDTIME 90 tablet 1  . omeprazole (PRILOSEC) 20 MG capsule Take 1 capsule (20 mg total) by mouth daily. 30 capsule 2  . sertraline (ZOLOFT) 100 MG tablet TAKE 1 TABLET BY MOUTH  DAILY 90 tablet 1  . simvastatin (ZOCOR) 20 MG tablet TAKE 1 TABLET BY MOUTH  DAILY AT 6 PM. 90 tablet 1  . triamcinolone cream (KENALOG) 0.1 % Apply 1 application topically 2 (two) times daily as needed. 45 g 1  . Vitamin D, Ergocalciferol, (DRISDOL) 50000 units CAPS capsule Take 1 capsule (50,000 Units total) by mouth every 7 (seven) days. 12 capsule 0   No current facility-administered  medications on file prior to visit.     PAST MEDICAL HISTORY: Past Medical History:  Diagnosis Date  . Allergy   . Asthma   . Back pain   . Bulging lumbar disc   . Chicken pox   . Constipation   . Depression   . Fatigue   . Frequent headaches   . Gall bladder disease   . Headache   . Heartburn   . Hyperlipidemia   . IBS (irritable bowel syndrome)   . Interstitial cystitis   . RA (rheumatoid arthritis) (HCC)   . Shortness of breath on exertion   . Trouble in sleeping   . Urine incontinence   . Vertigo   . Vision changes   . Vitamin D deficiency     PAST SURGICAL HISTORY: Past Surgical History:  Procedure Laterality Date  . CHOLECYSTECTOMY    . cyst removed from back    . NASAL SINUS SURGERY    . TUBAL LIGATION  1999    SOCIAL HISTORY: Social History   Tobacco Use  . Smoking status: Never Smoker  . Smokeless tobacco: Never Used  Substance Use Topics  . Alcohol use: Yes    Alcohol/week: 0.0 standard drinks    Comment: rare--wine  . Drug use: No    FAMILY HISTORY: Family History  Problem Relation Age of Onset  . COPD Mother   . Heart disease Mother   . Polymyalgia rheumatica Mother   . Emphysema Mother   . Stroke Mother   . Depression Mother   . Anxiety disorder Mother   . Heart disease Father   . Hypertension Father   . Hyperlipidemia Father   . Anxiety disorder Father   . Cancer Paternal Aunt        Breast  . Breast cancer Paternal Aunt 96  . Stroke Maternal Grandmother   . Cancer Maternal Aunt     ROS: Review of Systems  Constitutional: Positive for malaise/fatigue. Negative for weight loss.  Gastrointestinal: Negative for nausea and vomiting.  Musculoskeletal:       Negative muscle weakness  Psychiatric/Behavioral: Positive for depression. Negative for suicidal ideas.    PHYSICAL EXAM: Blood pressure 129/76, pulse 76, temperature 98.1 F (36.7 C), temperature source Oral, height 5\' 8"  (1.727 m), weight 236 lb (107 kg), SpO2 96  %. Body mass index is 35.88 kg/m. Physical Exam  Constitutional: She is oriented to person, place, and time. She appears well-developed and well-nourished.  Cardiovascular: Normal rate.  Pulmonary/Chest: Effort normal.  Musculoskeletal: Normal range of motion.  Neurological: She is oriented to person, place, and time.  Skin: Skin is warm and dry.  Psychiatric: She has a normal mood and affect. Her behavior is normal.  Vitals reviewed.   RECENT LABS AND TESTS: BMET  Component Value Date/Time   NA 143 10/16/2017 1000   K 4.3 10/16/2017 1000   CL 105 10/16/2017 1000   CO2 19 (L) 10/16/2017 1000   GLUCOSE 92 10/16/2017 1000   GLUCOSE 93 11/13/2015 1501   BUN 15 10/16/2017 1000   CREATININE 0.92 10/16/2017 1000   CALCIUM 9.4 10/16/2017 1000   GFRNONAA 71 10/16/2017 1000   GFRAA 82 10/16/2017 1000   Lab Results  Component Value Date   HGBA1C 5.1 11/17/2017   HGBA1C 5.0 11/13/2015   Lab Results  Component Value Date   INSULIN 8.7 11/17/2017   CBC    Component Value Date/Time   WBC 6.0 10/16/2017 1000   WBC 8.6 11/13/2015 1501   RBC 4.39 10/16/2017 1000   RBC 4.49 11/13/2015 1501   HGB 13.4 10/16/2017 1000   HCT 40.2 10/16/2017 1000   PLT 229 10/16/2017 1000   MCV 92 10/16/2017 1000   MCH 30.5 10/16/2017 1000   MCHC 33.3 10/16/2017 1000   MCHC 34.0 11/13/2015 1501   RDW 13.3 10/16/2017 1000   LYMPHSABS 1.4 10/16/2017 1000   EOSABS 0.2 10/16/2017 1000   BASOSABS 0.0 10/16/2017 1000   Iron/TIBC/Ferritin/ %Sat No results found for: IRON, TIBC, FERRITIN, IRONPCTSAT Lipid Panel     Component Value Date/Time   CHOL 174 10/16/2017 1000   TRIG 129 10/16/2017 1000   HDL 56 10/16/2017 1000   CHOLHDL 3.1 10/16/2017 1000   CHOLHDL 3 11/13/2015 1501   VLDL 29.8 11/13/2015 1501   LDLCALC 92 10/16/2017 1000   Hepatic Function Panel     Component Value Date/Time   PROT 6.8 10/16/2017 1000   ALBUMIN 4.3 10/16/2017 1000   AST 20 10/16/2017 1000   ALT 17 10/16/2017  1000   ALKPHOS 102 10/16/2017 1000   BILITOT 0.7 10/16/2017 1000      Component Value Date/Time   TSH 1.990 11/17/2017 1158   TSH 2.280 10/16/2017 1000   TSH 2.12 11/13/2015 1501  Results for [facility], Keaira R "KATIE" (MRN [id number]) as of 03/02/2018 13:27  Ref. Range 11/17/2017 11:58  Vitamin D, 25-Hydroxy Latest Ref Range: 30.0 - 100.0 ng/mL 26.2 (L)    ASSESSMENT AND PLAN: Vitamin D deficiency  Other depression - with emotional eating  At risk for osteoporosis  Class 2 severe obesity with serious comorbidity and body mass index (BMI) of 35.0 to 35.9 in adult, unspecified obesity type (HCC)  PLAN:  Vitamin D Deficiency Donna Vasquez was informed that low vitamin D levels contributes to fatigue and are associated with obesity, breast, and colon cancer. Donna Vasquez agrees to continue taking prescription Vit D @50 ,000 IU every week and will follow up for routine testing of vitamin D, at least 2-3 times per year. She was informed of the risk of over-replacement of vitamin D and agrees to not increase her dose unless she discusses this with first. Donna Vasquez agrees to follow up with our clinic in 2 weeks.  At risk for osteopenia and osteoporosis Donna Vasquez was given extended  (15 minutes) osteoporosis prevention counseling today. Donna Vasquez is at risk for osteopenia and osteoporsis due to her vitamin D deficiency. She was encouraged to take her vitamin D and follow her higher calcium diet and increase strengthening exercise to help strengthen her bones and decrease her risk of osteopenia and osteoporosis.  Depression with Emotional Eating Behaviors We discussed behavior modification techniques today to help Donna Vasquez deal with her emotional eating and depression. Donna Vasquez agrees to continue her medications and we will refer to Dr.  Dewaine Conger, our bariatric psychologist. Donna Vasquez agrees to follow up with our clinic in 2 weeks.  Obesity Donna Vasquez is currently in the action stage of change. As such, her goal is  to continue with weight loss efforts She has agreed to follow the Category 3 plan Donna Vasquez has been instructed to work up to a goal of 150 minutes of combined cardio and strengthening exercise per week for weight loss and overall health benefits. We discussed the following Behavioral Modification Strategies today: increasing lean protein intake, increasing vegetables, work on meal planning and easy cooking plans, and planning for success   Donna Vasquez has agreed to follow up with our clinic in 2 weeks. She was informed of the importance of frequent follow up visits to maximize her success with intensive lifestyle modifications for her multiple health conditions.   OBESITY BEHAVIORAL INTERVENTION VISIT  Today's visit was # 8   Starting weight: 248 lbs Starting date: 11/17/17 Today's weight : 236 lbs Today's date: 02/25/2018 Total lbs lost to date: 15    ASK: We discussed the diagnosis of obesity with Donna Vasquez today and Donna Vasquez agreed to give [name] permission to discuss obesity behavioral modification therapy today.  ASSESS: Donna Vasquez has the diagnosis of obesity and her BMI today is 35.56 Donna Vasquez is in the action stage of change   ADVISE: Donna Vasquez was educated on the multiple health risks of obesity as well as the benefit of weight loss to improve her health. She was advised of the need for long term treatment and the importance of lifestyle modifications to improve her current health and to decrease her risk of future health problems.  AGREE: Multiple dietary modification options and treatment options were discussed and  Donna Vasquez agreed to follow the recommendations documented in the above note.  ARRANGE: Donna Vasquez was educated on the importance of frequent visits to treat obesity as outlined per CMS and USPSTF guidelines and agreed to schedule her next follow up appointment today.  I, Burt Knack, am acting as transcriptionist for Debbra Riding, MD  I have reviewed the above  documentation for accuracy and completeness, and I agree with the above. - Debbra Riding, MD

## 2018-03-03 ENCOUNTER — Institutional Professional Consult (permissible substitution): Payer: Self-pay | Admitting: Internal Medicine

## 2018-03-04 ENCOUNTER — Encounter: Payer: Self-pay | Admitting: Family Medicine

## 2018-03-04 ENCOUNTER — Institutional Professional Consult (permissible substitution): Payer: Self-pay | Admitting: Internal Medicine

## 2018-03-05 IMAGING — MR MR CERVICAL SPINE W/O CM
5 series · 35 of 48 positions shown · non-contrast
Comparison: Plain film cervical spine 12/14/2016.

CLINICAL DATA: Left neck pain radiating into the left arm to the
little finger of the left hand. No known injury.

EXAM:
MRI CERVICAL SPINE WITHOUT CONTRAST
TECHNIQUE: Multiplanar, multisequence MR imaging of the cervical spine was
performed. No intravenous contrast was administered.

[Series 2: T2 · sagittal · 3.0mm · 0.56mm/px · 8 of 13 slices shown (1 of 2)]
[im 1/13]
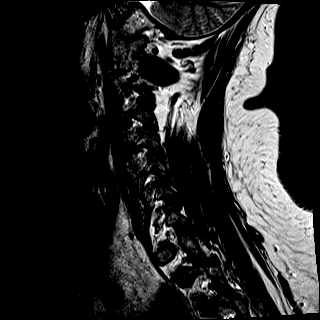
[im 2/13]
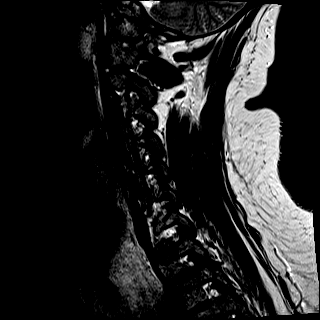
[im 4/13]
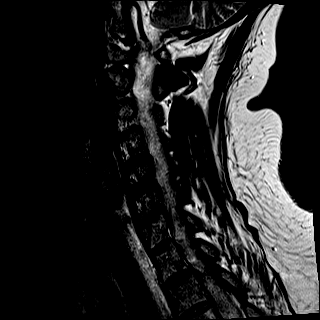
[im 6/13]
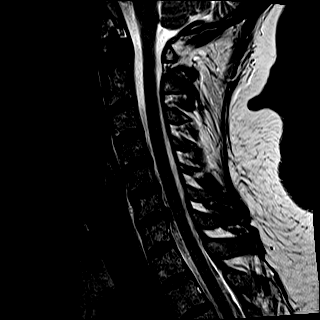
[im 7/13]
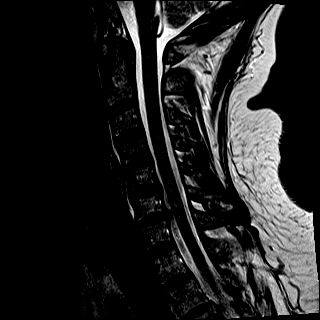
[im 9/13]
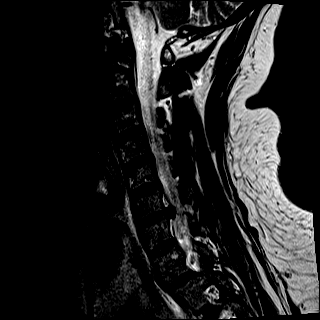
[im 11/13]
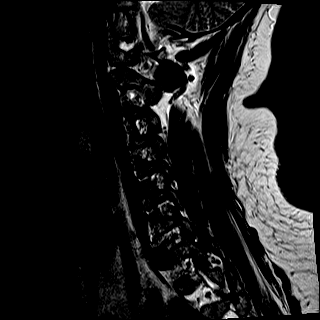
[im 13/13]
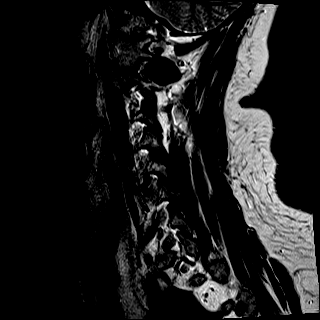

[Series 3: T1 · sagittal · 3.0mm · 0.70mm/px · 7 of 13 slices shown]
[im 1/13]
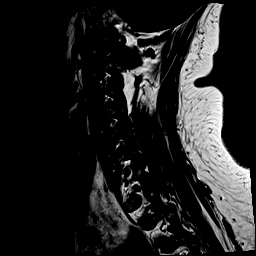
[im 3/13]
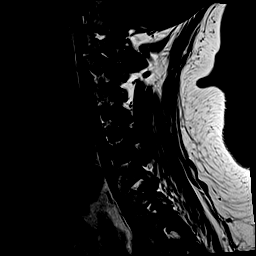
[im 5/13]
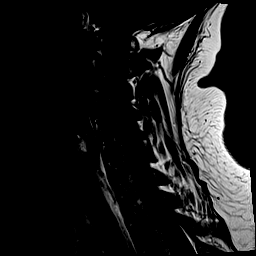
[im 7/13]
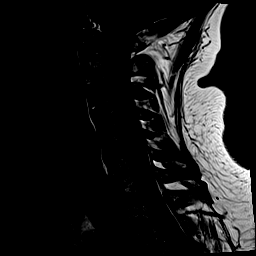
[im 9/13]
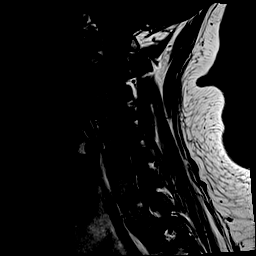
[im 11/13]
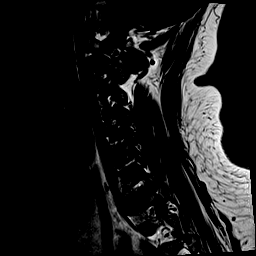
[im 13/13]
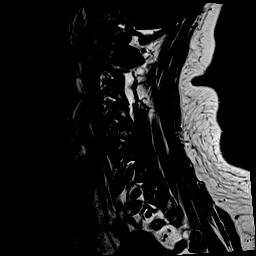

[Series 4: STIR · sagittal · 3.0mm · 0.35mm/px · 7 of 13 slices shown]
[im 1/13]
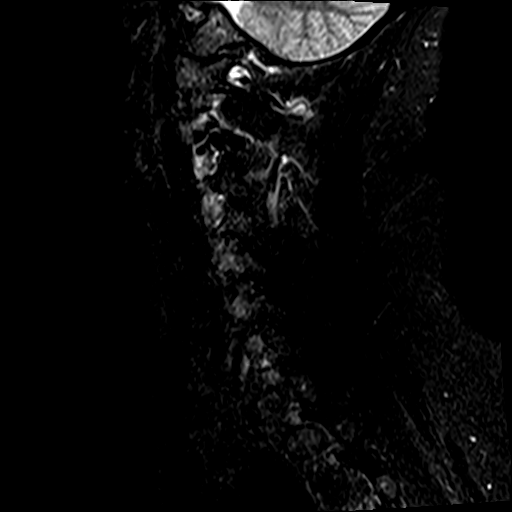
[im 3/13]
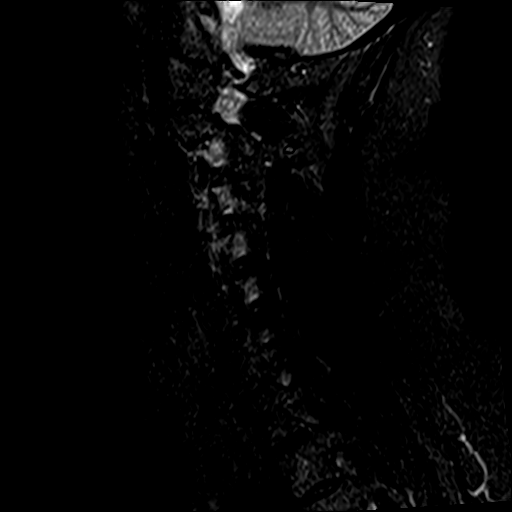
[im 5/13]
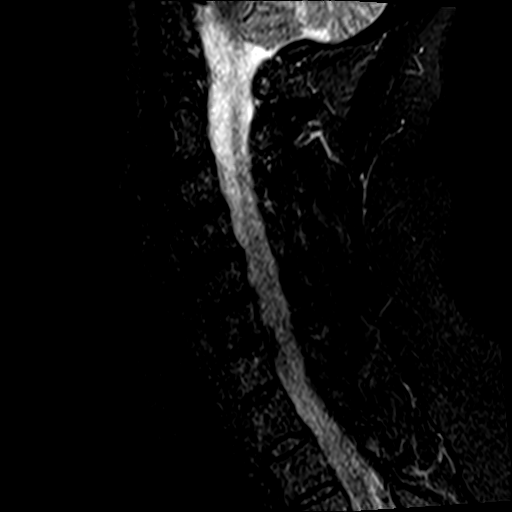
[im 7/13]
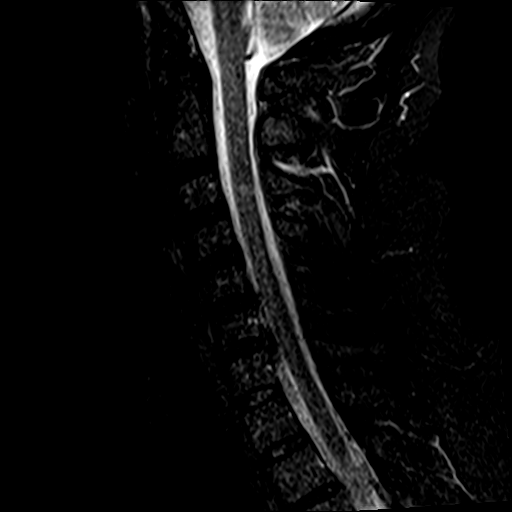
[im 9/13]
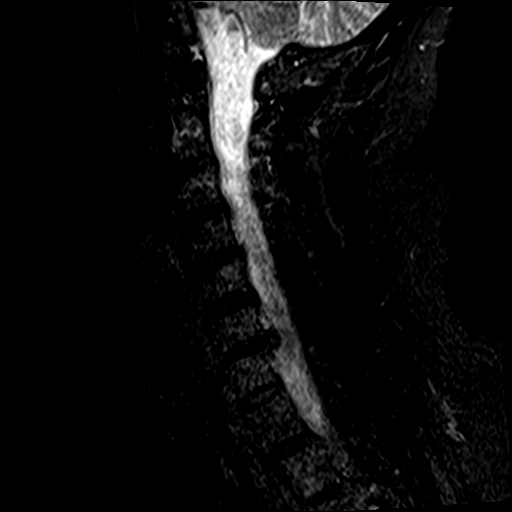
[im 11/13]
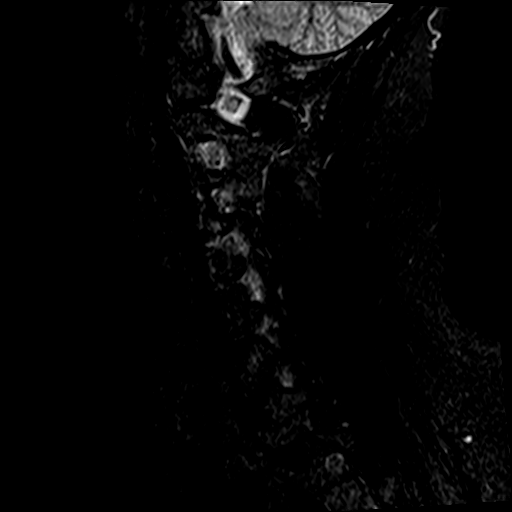
[im 13/13]
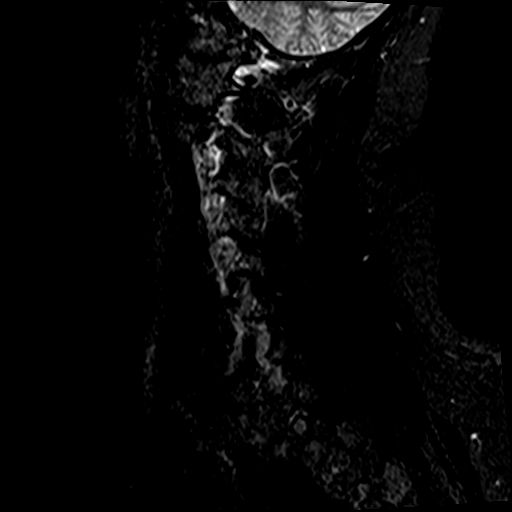

[Series 5: T2 · axial · 3.0mm · 0.62mm/px · z∈[-107,-17]mm · 9 of 25 slices shown (2 of 2)]
[im 1/25]
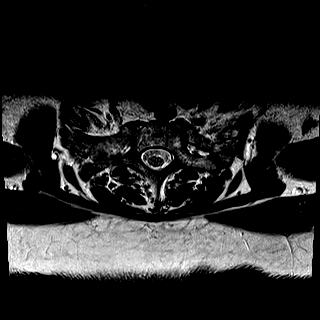
[im 5/25]
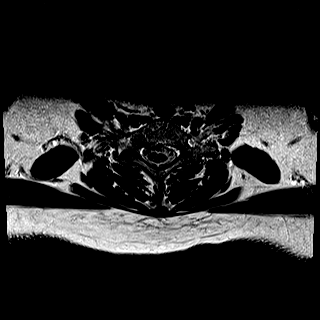
[im 9/25]
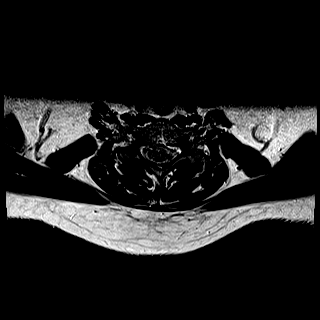
[im 11/25]
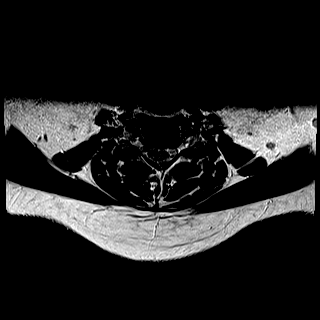
[im 13/25]
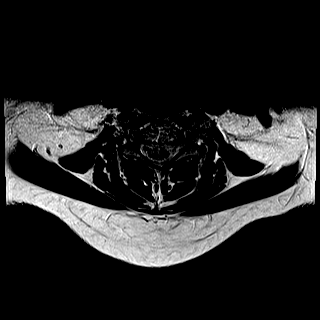
[im 15/25]
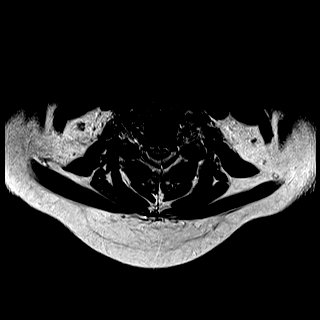
[im 17/25]
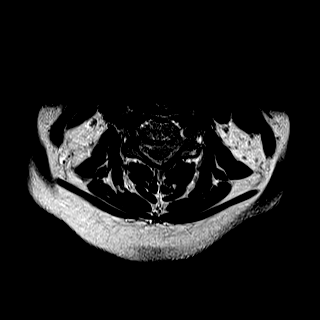
[im 21/25]
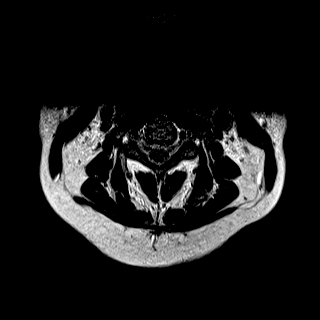
[im 25/25]
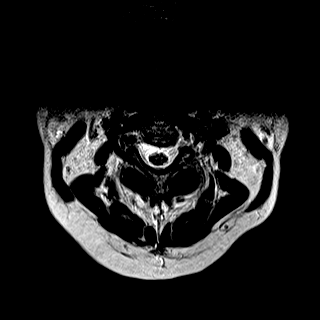

[Series 6: mpgr ax · axial · 3.0mm · 0.35mm/px · z∈[-99,-61]mm · 4 of 25 slices shown]
[im 1/25]
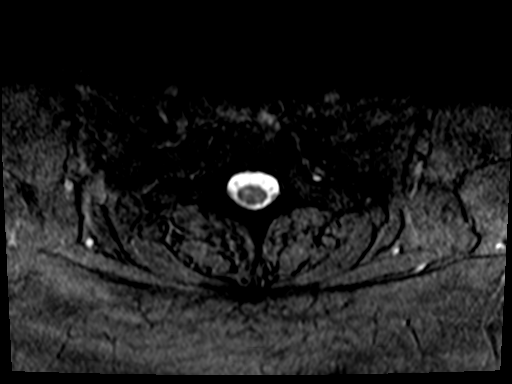
[im 5/25]
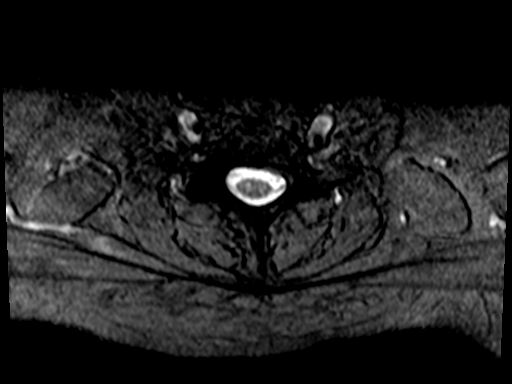
[im 9/25]
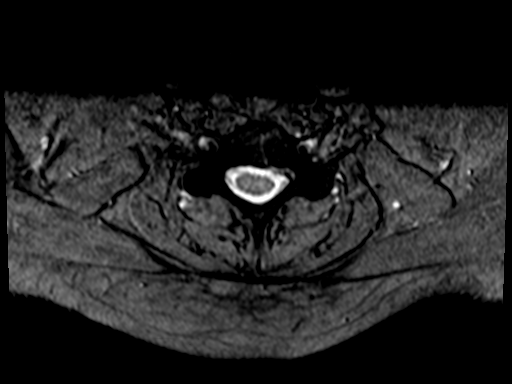
[im 11/25]
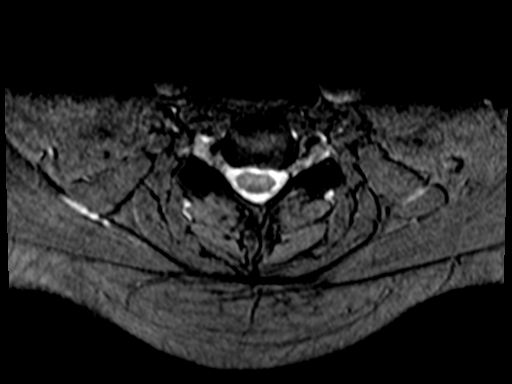

[35 of 48 positions shown; findings below may reference images not displayed]

FINDINGS: Alignment: Maintained.

Vertebrae: No fracture or worrisome lesion.

Cord: Normal signal throughout.

Posterior Fossa, vertebral arteries, paraspinal tissues: Negative.

Disc levels:

C2-3:  Negative.

C3-4:  Negative.

C4-5: Minimal disc bulge and uncovertebral disease without stenosis.

C5-6: Minimal disc bulge and uncovertebral disease without stenosis.

C6-7: The patient has a large disc protrusion on the left in the
foraminal entry zone. The disc impinges on the left C7 root as it
enters the foramen. The disc also mildly deflects the cord to the
right. The right foramen is open.

C7-T1:  Negative.
IMPRESSION: Large left-sided disc protrusion in the foraminal entry zone at C6-7
impinges on the left C7 root as it enters the foramen and slightly
deflects the cord to the right.

## 2018-03-17 NOTE — Progress Notes (Unsigned)
Office: [phone]  /  Fax: [phone] Date: March 18, 2018 Time Seen: *** Duration: *** Provider: Lawerance Cruel, PsyD Type of Session: Intake for Individual Therapy   Informed Consent:The provider's role was explained to Donna Vasquez. The provider reviewed and discussed issues of confidentiality, privacy, and limits therein. In addition to verbal informed consent, written informed consent for psychological services was obtained from Donna Vasquez prior to the initial intake interview. Written consent included information concerning the practice, financial arrangements, and confidentiality and patients' rights. Since the clinic is not a 24/7 crisis center, mental health emergency resources were shared and a handout was provided. The provider further explained the utilization of MyChart, e-mail, voicemail, and/or other messaging systems can be utilized for non-emergency reasons. Donna Vasquez verbally acknowledged understanding of the aforementioned, and agreed to use mental health emergency resources discussed if needed. Moreover, Donna Vasquez agreed information may be shared with other [facility] providers as needed for coordination of care, and written consent was obtained.   Chief Complaint: Donna Vasquez was referred by Donna Vasquez. Per the note for the visit with Donna Vasquez on February 25, 2018, "Donna Vasquez is on Wellbutrin and Zoloft, and has noticed increase in emotional eating and stress eating. She feels somewhat guilty if she is conscious of eating. Donna Vasquez struggles with emotional eating and using food for comfort to the extent that it is negatively impacting her health. She often snacks when she is not hungry. Donna Vasquez sometimes feels she is out of control and then feels guilty that she made poor food choices. She has been working on behavior modification techniques to help reduce her emotional eating and has been somewhat successful. She shows no sign of suicidal  or homicidal ideations."  Donna Vasquez was asked to complete a questionnaire assessing various behaviors related to emotional eating. Donna Vasquez endorsed the following: {gbmoodandfood:21755}.  HPI: Per the note for the initial visit with Donna Vasquez on November 17, 2017, Donna Vasquez started gaining weight after having babies, and her heaviest weight ever was 254 pounds. She described herself as a picky eater and does not like to eat healthier foods. During the initial appointment with Dr. Rinaldo Vasquez, Donna Vasquez reported experiencing the following: significant food cravings issues; skipping meals frequently; frequently drinking liquids with calories; frequently making poor food choices; frequently eating larger portions than normal; and struggling with emotional eating.  Mental Status Examination: Donna Vasquez arrived on time for the appointment. She presented as appropriately dressed and groomed. Donna Vasquez appeared her stated age and demonstrated adequate orientation to time, place, person, and purpose of the appointment. She also demonstrated appropriate eye contact. No psychomotor abnormalities or behavioral peculiarities noted. Her mood was {gbmood:21757} with congruent affect. Her thought processes were logical, linear, and goal-directed. No hallucinations, delusions, bizarre thinking or behavior reported or observed. Judgment, insight, and impulse control appeared to be grossly intact. There was no evidence of paraphasias (i.e., errors in speech, gross mispronunciations, and word substitutions), repetition deficits, or disturbances in volume or prosody (i.e., rhythm and intonation). There was no evidence of attention or memory impairments. Donna Vasquez denied current suicidal and homicidal ideation, plan, and intent.   The Mini-Mental State Examination, Second Edition (MMSE-2) was administered. The MMSE-2 briefly screens for cognitive dysfunction and overall mental status and assesses different cognitive domains: orientation,  registration, attention and calculation, recall, and language and praxis. Donna Vasquez received *** out of 30 points possible on the MMSE-2, which is noted in the *** range. The following points were lost: ***  Family & Psychosocial  History: ***  Medical History: ***  Mental Health History: ***  [name] reported experiencing the following: {gbintakesxs:21966}  Donna Vasquez denied experiencing the following: {gbsxs:21965}  Structured Assessment Results: The Patient Health Questionnaire-9 (PHQ-9) is a self-report measure that assesses symptoms and severity of depression over the course of the last two weeks. Donna Vasquez obtained a score of *** suggesting {GBPHQ9SEVERITY:21752}. Donna Vasquez finds the endorsed symptoms to be {gbphq9difficulty:21754}.    The Generalized Anxiety Disorder-7 (GAD-7) is a brief self-report measure that assesses symptoms of anxiety over the course of the last two weeks. Donna Vasquez obtained a score of *** suggesting {gbgad7severity:21753}.  Interventions: A chart review was conducted prior to the clinical intake interview. The MMSE-2, PHQ-9, and GAD-7 were administered and a clinical intake interview was completed. In addition, Donna Vasquez was asked to complete a Mood and Food questionnaire to assess various behaviors related to emotional eating. Throughout session, empathic reflections and validation was provided. Continuing treatment with this provider was discussed and a treatment goal was established. Psychoeducation regarding emotional versus physical hunger was provided. Donna Vasquez was given a handout to utilize between now and the next appointment to increase awareness of hunger patterns and subsequent eating. ***  Provisional DSM-5 Diagnosis: ***  Plan: Donna Vasquez expressed understanding and agreement with the initial treatment plan of care. She appears able and willing to participate as evidenced by collaboration on a treatment goal, engagement in reciprocal conversation, and asking questions  as needed for clarification. The next appointment will be scheduled in {gbweeks:21758}. The following treatment goal was established: {gbtxgoals:21759}. For the aforementioned goal, Adylene can benefit from biweekly sessions that are brief in duration for approximately four to six sessions.

## 2018-03-18 ENCOUNTER — Encounter: Payer: Self-pay | Admitting: Internal Medicine

## 2018-03-18 ENCOUNTER — Ambulatory Visit (INDEPENDENT_AMBULATORY_CARE_PROVIDER_SITE_OTHER): Payer: BLUE CROSS/BLUE SHIELD | Admitting: Family Medicine

## 2018-03-18 ENCOUNTER — Ambulatory Visit: Payer: BLUE CROSS/BLUE SHIELD | Admitting: Internal Medicine

## 2018-03-18 ENCOUNTER — Institutional Professional Consult (permissible substitution): Payer: Self-pay | Admitting: Internal Medicine

## 2018-03-18 ENCOUNTER — Ambulatory Visit (INDEPENDENT_AMBULATORY_CARE_PROVIDER_SITE_OTHER): Payer: Self-pay | Admitting: Psychology

## 2018-03-18 VITALS — BP 146/91 | HR 79 | Temp 98.4°F | Ht 68.0 in | Wt 232.0 lb

## 2018-03-18 VITALS — BP 120/66 | HR 87 | Resp 16 | Ht 68.0 in | Wt 236.0 lb

## 2018-03-18 DIAGNOSIS — R030 Elevated blood-pressure reading, without diagnosis of hypertension: Secondary | ICD-10-CM

## 2018-03-18 DIAGNOSIS — E66812 Obesity, class 2: Secondary | ICD-10-CM

## 2018-03-18 DIAGNOSIS — Z6835 Body mass index (BMI) 35.0-35.9, adult: Secondary | ICD-10-CM | POA: Diagnosis not present

## 2018-03-18 DIAGNOSIS — G4719 Other hypersomnia: Secondary | ICD-10-CM

## 2018-03-18 DIAGNOSIS — E559 Vitamin D deficiency, unspecified: Secondary | ICD-10-CM | POA: Diagnosis not present

## 2018-03-18 NOTE — Patient Instructions (Signed)
Plan for sleep Study to test for OSA  Recommend Weight loss

## 2018-03-18 NOTE — Progress Notes (Signed)
Name: Donna Vasquez MRN: [id number] DOB: [birth year]     CONSULTATION DATE:  REFERRING MD :   CHIEF COMPLAINT: snoring and excessive daytime sleepiness  STUDIES:     CXR independently reviewed by Me    CT chest Independently reviewed by Me  HISTORY OF PRESENT ILLNESS: 52 yo obese white female seen today for excessive daytime sleepiness Patient has been having extreme fatigue and tiredness, lack of energy +  very Loud snoring every night + struggling breathe at night and gasps for air  No SOB, Fevers, chills No NVD    Discussed sleep data and reviewed with patient.  Encouraged proper weight management.  Discussed driving precautions and its relationship with hypersomnolence.  Discussed operating dangerous equipment and its relationship with hypersomnolence.  Discussed sleep hygiene, and benefits of a fixed sleep waked time.  The importance of getting eight or more hours of sleep discussed with patient.  Discussed limiting the use of the computer and television before bedtime.  Decrease naps during the day, so night time sleep will become enhanced.  Limit caffeine, and sleep deprivation.  HTN, stroke, and heart failure are potential risk factors.   Patient weighs 232 pounds   PAST MEDICAL HISTORY :   has a past medical history of Allergy, Asthma, Back pain, Bulging lumbar disc, Chicken pox, Constipation, Depression, Fatigue, Frequent headaches, Gall bladder disease, Headache, Heartburn, Hyperlipidemia, IBS (irritable bowel syndrome), Interstitial cystitis, RA (rheumatoid arthritis) (HCC), Shortness of breath on exertion, Trouble in sleeping, Urine incontinence, Vertigo, Vision changes, and Vitamin D deficiency.  has a past surgical history that includes Cholecystectomy; Tubal ligation (1999); Nasal sinus surgery; and cyst removed from back. Prior to Admission medications   Medication Sig Start Date End Date Taking? Authorizing Provider  albuterol (PROAIR HFA) 108 (90  Base) MCG/ACT inhaler Inhale 1-2 puffs into the lungs every 6 (six) hours as needed for wheezing or shortness of breath. 07/05/16  Yes Joaquim Nam, MD  aspirin 81 MG tablet Take 81 mg by mouth daily.   Yes [provider]  aspirin-acetaminophen-caffeine (EXCEDRIN MIGRAINE) [phone] MG tablet Take by mouth every 6 (six) hours as needed for headache.   Yes [provider]  buPROPion (WELLBUTRIN XL) 300 MG 24 hr tablet Take 1 tablet (300 mg total) by mouth daily. 10/16/17  Yes Dianne Dun, MD  dextromethorphan-guaiFENesin [facility] DM) 30-600 MG 12hr tablet Take 1 tablet by mouth 2 (two) times daily as needed for cough.   Yes [provider]  Fluticasone-Salmeterol (ADVAIR) 100-50 MCG/DOSE AEPB Inhale 1 puff into the lungs 2 (two) times daily.   Yes [provider]  montelukast (SINGULAIR) 10 MG tablet TAKE 1 TABLET (10 MG TOTAL) BY MOUTH AT BEDTIME. 09/27/15  Yes Lorre Munroe, NP  niacin (NIASPAN) 1000 MG CR tablet TAKE 1 TABLET BY MOUTH AT  BEDTIME 02/17/18  Yes Dianne Dun, MD  omeprazole (PRILOSEC) 20 MG capsule Take 1 capsule (20 mg total) by mouth daily. 11/13/15  Yes Lorre Munroe, NP  sertraline (ZOLOFT) 100 MG tablet TAKE 1 TABLET BY MOUTH  DAILY 02/17/18  Yes Dianne Dun, MD  simvastatin (ZOCOR) 20 MG tablet TAKE 1 TABLET BY MOUTH  DAILY AT 6 PM. 02/17/18  Yes Dianne Dun, MD  traZODone (DESYREL) 50 MG tablet Take 0.5-1 tablets (25-50 mg total) by mouth at bedtime as needed for sleep. 02/26/18  Yes Dianne Dun, MD  triamcinolone cream (KENALOG) 0.1 % Apply 1 application topically 2 (two)  times daily as needed. 08/18/15  Yes Karie Schwalbe, MD  Vitamin D, Ergocalciferol, (DRISDOL) 50000 units CAPS capsule Take 1 capsule (50,000 Units total) by mouth every 7 (seven) days. 02/12/18  Yes Filbert Schilder, MD   Allergies  Allergen Reactions  . Flonase [Fluticasone Propionate] Other (See Comments)    Nasal irritation, burning, but can  tolerate advair inhaler.    . Sulfa Antibiotics Hives    FAMILY HISTORY:  family history includes Anxiety disorder in her father and mother; Breast cancer (age of onset: 65) in her paternal aunt; COPD in her mother; Cancer in her maternal aunt and paternal aunt; Depression in her mother; Emphysema in her mother; Heart disease in her father and mother; Hyperlipidemia in her father; Hypertension in her father; Polymyalgia rheumatica in her mother; Stroke in her maternal grandmother and mother. SOCIAL HISTORY:  reports that she has never smoked. She has never used smokeless tobacco. She reports that she drinks alcohol. She reports that she does not use drugs.  REVIEW OF SYSTEMS:   Constitutional: Negative for fever, chills, weight loss, +malaise/fatigue  HENT: Negative for hearing loss, ear pain, nosebleeds, congestion, sore throat, neck pain, tinnitus and ear discharge.   Eyes: Negative for blurred vision, double vision, photophobia, pain, discharge and redness.  Respiratory: Negative for cough, hemoptysis, sputum production, shortness of breath, wheezing and stridor.   Cardiovascular: Negative for chest pain, palpitations, orthopnea, claudication, leg swelling and PND.  Gastrointestinal: Negative for heartburn, nausea, vomiting, abdominal pain, diarrhea, constipation, blood in stool and melena.  Genitourinary: Negative for dysuria, urgency, frequency, hematuria and flank pain.  Musculoskeletal: Negative for myalgias, back pain, joint pain and falls.  Skin: Negative for itching and rash.  Neurological: Negative for dizziness, tingling, tremors, sensory change, speech change, focal weakness, seizures, loss of consciousness, weakness and headaches.  Endo/Heme/Allergies: Negative for environmental allergies and polydipsia. Does not bruise/bleed easily.  ALL OTHER ROS ARE NEGATIVE   BP 120/66 (BP Location: Left Arm, Cuff Size: Large)   Pulse 87   Resp 16   Ht 5\' 8"  (1.727 m)   Wt 236 lb (107  kg)   SpO2 96%   BMI 35.88 kg/m    Physical Examination:   GENERAL:NAD, no fevers, chills, no weakness no fatigue HEAD: Normocephalic, atraumatic.  EYES: Pupils equal, round, reactive to light. Extraocular muscles intact. No scleral icterus.  MOUTH: Moist mucosal membrane.   EAR, NOSE, THROAT: Clear without exudates. No external lesions.  NECK: Supple. No thyromegaly. No nodules. No JVD.  PULMONARY:CTA B/L no wheezes, no crackles, no rhonchi CARDIOVASCULAR: S1 and S2. Regular rate and rhythm. No murmurs, rubs, or gallops. No edema.  GASTROINTESTINAL: Soft, nontender, nondistended. No masses. Positive bowel sounds.  MUSCULOSKELETAL: No swelling, clubbing, or edema. Range of motion full in all extremities.  NEUROLOGIC: Cranial nerves II through XII are intact. No gross focal neurological deficits.  SKIN: No ulceration, lesions, rashes, or cyanosis. Skin warm and dry. Turgor intact.  PSYCHIATRIC: Mood, affect within normal limits. The patient is awake, alert and oriented x 3. Insight, judgment intact.      ASSESSMENT / PLAN: 52 yo obese white female with signs and symptoms of excessive daytime sleepiness with snoring and gasping for air with high probability of OSA  Patient needs sleep study ASAP  Obesity -recommend significant weight loss -recommend changing diet  Deconditioned state -Recommend increased daily activity and exercise     Patient  satisfied with Plan of action and management. All questions answered Follow up  after test completed  Lucie Leather, M.D.  [facility]  Medical Director [facility] Medical Director [facility] Cardio-Pulmonary Department

## 2018-03-19 NOTE — Progress Notes (Signed)
Office: [phone]  /  Fax: [phone]   HPI:   Chief Complaint: OBESITY Donna Vasquez is here to discuss her progress with her obesity treatment plan. She is on the  follow the Category 3 plan and is following her eating plan approximately 90 % of the time. She states she is exercising by doing cardio and strength training for 20 minutes 2 times per week. Donna Vasquez has done better in the last few weeks. She has not overindulged as much. She is slightly more determined and getting most of the food in. Reports having some stomach uneasiness.  Her weight is 232 lb (105.2 kg) today and has had a weight loss of 4 pounds over a period of 3 weeks since her last visit. She has lost 19 lbs since starting treatment with [name].  Vitamin D deficiency Donna Vasquez has a diagnosis of vitamin D deficiency. She is currently taking vit D and denies nausea, vomiting or muscle weakness. She reports having fatigue.   Ref. Range 11/17/2017 11:58  Vitamin D, 25-Hydroxy Latest Ref Range: 30.0 - 100.0 ng/mL 26.2 (L)   Elevated Blood Pressure Donna Vasquez had an elevated blood pressure reading, which is controlled normally. She denies headache, chest pain/pressure.   ALLERGIES: Allergies  Allergen Reactions  . Flonase [Fluticasone Propionate] Other (See Comments)    Nasal irritation, burning, but can tolerate advair inhaler.    . Sulfa Antibiotics Hives    MEDICATIONS: Current Outpatient Medications on File Prior to Visit  Medication Sig Dispense Refill  . albuterol (PROAIR HFA) 108 (90 Base) MCG/ACT inhaler Inhale 1-2 puffs into the lungs every 6 (six) hours as needed for wheezing or shortness of breath. 1 Inhaler 2  . aspirin 81 MG tablet Take 81 mg by mouth daily.    [name] aspirin-acetaminophen-caffeine (EXCEDRIN MIGRAINE) 250-250-65 MG tablet Take by mouth every 6 (six) hours as needed for headache.    [name] buPROPion (WELLBUTRIN XL) 300 MG 24 hr tablet Take 1 tablet (300 mg total) by mouth daily. 90 tablet 3  .  dextromethorphan-guaiFENesin (MUCINEX DM) 30-600 MG 12hr tablet Take 1 tablet by mouth 2 (two) times daily as needed for cough.    . Fluticasone-Salmeterol (ADVAIR) 100-50 MCG/DOSE AEPB Inhale 1 puff into the lungs 2 (two) times daily.    . montelukast (SINGULAIR) 10 MG tablet TAKE 1 TABLET (10 MG TOTAL) BY MOUTH AT BEDTIME. 30 tablet 2  . niacin (NIASPAN) 1000 MG CR tablet TAKE 1 TABLET BY MOUTH AT  BEDTIME 90 tablet 1  . omeprazole (PRILOSEC) 20 MG capsule Take 1 capsule (20 mg total) by mouth daily. 30 capsule 2  . sertraline (ZOLOFT) 100 MG tablet TAKE 1 TABLET BY MOUTH  DAILY 90 tablet 1  . simvastatin (ZOCOR) 20 MG tablet TAKE 1 TABLET BY MOUTH  DAILY AT 6 PM. 90 tablet 1  . traZODone (DESYREL) 50 MG tablet Take 0.5-1 tablets (25-50 mg total) by mouth at bedtime as needed for sleep. 30 tablet 3  . triamcinolone cream (KENALOG) 0.1 % Apply 1 application topically 2 (two) times daily as needed. 45 g 1  . Vitamin D, Ergocalciferol, (DRISDOL) 50000 units CAPS capsule Take 1 capsule (50,000 Units total) by mouth every 7 (seven) days. 12 capsule 0   No current facility-administered medications on file prior to visit.     PAST MEDICAL HISTORY: Past Medical History:  Diagnosis Date  . Allergy   . Asthma   . Back pain   . Bulging lumbar disc   . Chicken pox   .  Constipation   . Depression   . Fatigue   . Frequent headaches   . Gall bladder disease   . Headache   . Heartburn   . Hyperlipidemia   . IBS (irritable bowel syndrome)   . Interstitial cystitis   . RA (rheumatoid arthritis) (HCC)   . Shortness of breath on exertion   . Trouble in sleeping   . Urine incontinence   . Vertigo   . Vision changes   . Vitamin D deficiency     PAST SURGICAL HISTORY: Past Surgical History:  Procedure Laterality Date  . CHOLECYSTECTOMY    . cyst removed from back    . NASAL SINUS SURGERY    . TUBAL LIGATION  1999    SOCIAL HISTORY: Social History   Tobacco Use  . Smoking status:  Never Smoker  . Smokeless tobacco: Never Used  Substance Use Topics  . Alcohol use: Yes    Alcohol/week: 0.0 standard drinks    Comment: rare--wine  . Drug use: No    FAMILY HISTORY: Family History  Problem Relation Age of Onset  . COPD Mother   . Heart disease Mother   . Polymyalgia rheumatica Mother   . Emphysema Mother   . Stroke Mother   . Depression Mother   . Anxiety disorder Mother   . Heart disease Father   . Hypertension Father   . Hyperlipidemia Father   . Anxiety disorder Father   . Cancer Paternal Aunt        Breast  . Breast cancer Paternal Aunt 68  . Stroke Maternal Grandmother   . Cancer Maternal Aunt     ROS: Review of Systems  Constitutional: Positive for weight loss.  Cardiovascular: Negative for chest pain.       Negative for chest pressure   Neurological: Negative for headaches.    PHYSICAL EXAM: Blood pressure (!) 146/91, pulse 79, temperature 98.4 F (36.9 C), temperature source Oral, height 5\' 8"  (1.727 m), weight 232 lb (105.2 kg), SpO2 96 %. Body mass index is 35.28 kg/m. Physical Exam  Constitutional: She is oriented to person, place, and time. She appears well-developed and well-nourished.  HENT:  Head: Normocephalic.  Neck: Normal range of motion.  Cardiovascular: Normal rate.  Pulmonary/Chest: Effort normal.  Musculoskeletal: Normal range of motion.  Neurological: She is alert and oriented to person, place, and time.  Skin: Skin is warm and dry.  Psychiatric: She has a normal mood and affect. Her behavior is normal.  Vitals reviewed.   RECENT LABS AND TESTS: BMET    Component Value Date/Time   NA 143 10/16/2017 1000   K 4.3 10/16/2017 1000   CL 105 10/16/2017 1000   CO2 19 (L) 10/16/2017 1000   GLUCOSE 92 10/16/2017 1000   GLUCOSE 93 11/13/2015 1501   BUN 15 10/16/2017 1000   CREATININE 0.92 10/16/2017 1000   CALCIUM 9.4 10/16/2017 1000   GFRNONAA 71 10/16/2017 1000   GFRAA 82 10/16/2017 1000   Lab Results    Component Value Date   HGBA1C 5.1 11/17/2017   HGBA1C 5.0 11/13/2015   Lab Results  Component Value Date   INSULIN 8.7 11/17/2017   CBC    Component Value Date/Time   WBC 6.0 10/16/2017 1000   WBC 8.6 11/13/2015 1501   RBC 4.39 10/16/2017 1000   RBC 4.49 11/13/2015 1501   HGB 13.4 10/16/2017 1000   HCT 40.2 10/16/2017 1000   PLT 229 10/16/2017 1000   MCV 92 10/16/2017 1000  MCH 30.5 10/16/2017 1000   MCHC 33.3 10/16/2017 1000   MCHC 34.0 11/13/2015 1501   RDW 13.3 10/16/2017 1000   LYMPHSABS 1.4 10/16/2017 1000   EOSABS 0.2 10/16/2017 1000   BASOSABS 0.0 10/16/2017 1000   Iron/TIBC/Ferritin/ %Sat No results found for: IRON, TIBC, FERRITIN, IRONPCTSAT Lipid Panel     Component Value Date/Time   CHOL 174 10/16/2017 1000   TRIG 129 10/16/2017 1000   HDL 56 10/16/2017 1000   CHOLHDL 3.1 10/16/2017 1000   CHOLHDL 3 11/13/2015 1501   VLDL 29.8 11/13/2015 1501   LDLCALC 92 10/16/2017 1000   Hepatic Function Panel     Component Value Date/Time   PROT 6.8 10/16/2017 1000   ALBUMIN 4.3 10/16/2017 1000   AST 20 10/16/2017 1000   ALT 17 10/16/2017 1000   ALKPHOS 102 10/16/2017 1000   BILITOT 0.7 10/16/2017 1000      Component Value Date/Time   TSH 1.990 11/17/2017 1158   TSH 2.280 10/16/2017 1000   TSH 2.12 11/13/2015 1501    Ref. Range 11/17/2017 11:58  Vitamin D, 25-Hydroxy Latest Ref Range: 30.0 - 100.0 ng/mL 26.2 (L)    ASSESSMENT AND PLAN: Vitamin D deficiency  Elevated blood pressure reading  Class 2 severe obesity with serious comorbidity and body mass index (BMI) of 35.0 to 35.9 in adult, unspecified obesity type (HCC)  PLAN: Vitamin D Deficiency Donna Vasquez was informed that low vitamin D levels contributes to fatigue and are associated with obesity, breast, and colon cancer. She agrees to continue to take prescription Vit D @50 ,000 IU every week and will follow up for routine testing of vitamin D, at least 2-3 times per year. She was informed of the  risk of over-replacement of vitamin D and agrees to not increase her dose unless she discusses this with [name] first. We will repeat labs at next visit. Agrees to follow up with our clinic as directed.   Elevated Blood Pressure Donna Vasquez educated on the effects of elevated blood pressure. We will follow up at next visit.  Agrees to follow up with our clinic as directed.   I spent > than 50% of the 15 minute visit on counseling as documented in the note.  Obesity Donna Vasquez is currently in the action stage of change. As such, her goal is to continue with weight loss efforts She has agreed to follow the Category 2 plan Donna Vasquez has been instructed to work up to a goal of 150 minutes of combined cardio and strengthening exercise per week for weight loss and overall health benefits. We discussed the following Behavioral Modification Strategies today: increasing lean protein intake, increasing vegetables and work on meal planning, planning for success, and easy cooking plans.    Donna Vasquez has agreed to follow up with our clinic in 2 weeks. She was informed of the importance of frequent follow up visits to maximize her success with intensive lifestyle modifications for her multiple health conditions.   OBESITY BEHAVIORAL INTERVENTION VISIT  Today's visit was # 9   Starting weight: 248 lb Starting date: 11/17/17 Today's weight : 232 lb Today's date: 03/18/18 Total lbs lost to date: 19 lb    ASK: We discussed the diagnosis of obesity with Donna Vasquez today and Donna Vasquez agreed to give [name] permission to discuss obesity behavioral modification therapy today.  ASSESS: Donna Vasquez has the diagnosis of obesity and her BMI today is 35.89 Donna Vasquez is in the action stage of change   ADVISE: Donna Vasquez was educated on the multiple  health risks of obesity as well as the benefit of weight loss to improve her health. She was advised of the need for long term treatment and the importance of lifestyle modifications to  improve her current health and to decrease her risk of future health problems.  AGREE: Multiple dietary modification options and treatment options were discussed and  Donna Vasquez agreed to follow the recommendations documented in the above note.  ARRANGE: Donna Vasquez was educated on the importance of frequent visits to treat obesity as outlined per CMS and USPSTF guidelines and agreed to schedule her next follow up appointment today.  I, Jeralene Peters, am acting as transcriptionist for Debbra Riding, MD   I have reviewed the above documentation for accuracy and completeness, and I agree with the above. - Debbra Riding, MD

## 2018-04-02 ENCOUNTER — Ambulatory Visit (INDEPENDENT_AMBULATORY_CARE_PROVIDER_SITE_OTHER): Payer: BLUE CROSS/BLUE SHIELD | Admitting: Family Medicine

## 2018-04-02 ENCOUNTER — Telehealth: Payer: Self-pay | Admitting: Internal Medicine

## 2018-04-02 ENCOUNTER — Encounter (INDEPENDENT_AMBULATORY_CARE_PROVIDER_SITE_OTHER): Payer: Self-pay

## 2018-04-02 VITALS — BP 141/78 | HR 78 | Temp 98.0°F | Ht 68.0 in | Wt 230.0 lb

## 2018-04-02 DIAGNOSIS — Z9189 Other specified personal risk factors, not elsewhere classified: Secondary | ICD-10-CM

## 2018-04-02 DIAGNOSIS — G4733 Obstructive sleep apnea (adult) (pediatric): Secondary | ICD-10-CM | POA: Diagnosis not present

## 2018-04-02 DIAGNOSIS — E8881 Metabolic syndrome: Secondary | ICD-10-CM | POA: Diagnosis not present

## 2018-04-02 DIAGNOSIS — E559 Vitamin D deficiency, unspecified: Secondary | ICD-10-CM

## 2018-04-02 DIAGNOSIS — Z6835 Body mass index (BMI) 35.0-35.9, adult: Secondary | ICD-10-CM

## 2018-04-02 DIAGNOSIS — E7849 Other hyperlipidemia: Secondary | ICD-10-CM

## 2018-04-02 NOTE — Telephone Encounter (Signed)
Pt is calling to find out what time she can come pick up her sleep study. Pt states it is ok to leave a detailed message

## 2018-04-02 NOTE — Telephone Encounter (Signed)
Pt came in this morning around 9:45 and picked up the HST device.  Nothing else needed at this time. Rhonda J Cobb

## 2018-04-03 LAB — LIPID PANEL
CHOLESTEROL TOTAL: 134 mg/dL (ref 100–199)
Chol/HDL Ratio: 2.6 ratio (ref 0.0–4.4)
HDL: 52 mg/dL (ref >39)
LDL CALC: 65 mg/dL (ref 0–99)
TRIGLYCERIDES: 87 mg/dL (ref 0–149)
VLDL CHOLESTEROL CAL: 17 mg/dL (ref 5–40)

## 2018-04-03 LAB — COMPREHENSIVE METABOLIC PANEL
ALBUMIN: 4.4 g/dL (ref 3.5–5.5)
ALK PHOS: 98 IU/L (ref 39–117)
ALT: 22 IU/L (ref 0–32)
AST: 23 IU/L (ref 0–40)
Albumin/Globulin Ratio: 2.2 (ref 1.2–2.2)
BUN / CREAT RATIO: 18 (ref 9–23)
BUN: 18 mg/dL (ref 6–24)
Bilirubin Total: 0.4 mg/dL (ref 0.0–1.2)
CO2: 23 mmol/L (ref 20–29)
CREATININE: 0.99 mg/dL (ref 0.57–1.00)
Calcium: 9.3 mg/dL (ref 8.7–10.2)
Chloride: 99 mmol/L (ref 96–106)
GFR calc non Af Amer: 65 mL/min/{1.73_m2} (ref >59)
GFR, EST AFRICAN AMERICAN: 75 mL/min/{1.73_m2} (ref >59)
GLOBULIN, TOTAL: 2 g/dL (ref 1.5–4.5)
Glucose: 90 mg/dL (ref 65–99)
Potassium: 3.9 mmol/L (ref 3.5–5.2)
Sodium: 140 mmol/L (ref 134–144)
TOTAL PROTEIN: 6.4 g/dL (ref 6.0–8.5)

## 2018-04-03 LAB — HEMOGLOBIN A1C
Est. average glucose Bld gHb Est-mCnc: 97 mg/dL
Hgb A1c MFr Bld: 5 % (ref 4.8–5.6)

## 2018-04-03 LAB — VITAMIN D 25 HYDROXY (VIT D DEFICIENCY, FRACTURES): Vit D, 25-Hydroxy: 42.5 ng/mL (ref 30.0–100.0)

## 2018-04-03 LAB — INSULIN, RANDOM: INSULIN: 10.1 u[IU]/mL (ref 2.6–24.9)

## 2018-04-06 ENCOUNTER — Telehealth: Payer: Self-pay

## 2018-04-06 DIAGNOSIS — G4733 Obstructive sleep apnea (adult) (pediatric): Secondary | ICD-10-CM | POA: Diagnosis not present

## 2018-04-06 NOTE — Telephone Encounter (Signed)
LM on VM for patient to call with sleep study results.  Moderate OSA with AHI 29. OSA was severe in supine position and mild in all other positions.

## 2018-04-07 NOTE — Telephone Encounter (Signed)
2nd message left.

## 2018-04-07 NOTE — Telephone Encounter (Signed)
Pt aware of results. Orders placed  Nothing further needed. 

## 2018-04-07 NOTE — Progress Notes (Signed)
Office: [phone]  /  Fax: [phone]   HPI:   Chief Complaint: OBESITY Donna Vasquez is here to discuss her progress with her obesity treatment plan. She is on the Category 2 plan and is following her eating plan approximately 90 % of the time. She states she is exercising 0 minutes 0 times per week. Donna Vasquez voices following the plan was easier past few weeks, but voices that she struggles finding initiative to exercise in past 2 weeks. She is planning for a sleep study tonight.  Her weight is 230 lb (104.3 kg) today and has had a weight loss of 2 pounds over a period of 2 weeks since her last visit. She has lost 18 lbs since starting treatment with [name].  Vitamin D deficiency Donna Vasquez has a diagnosis of vitamin D deficiency. She is currently taking vit D. She admits fatigue is improving and denies nausea, vomiting, or muscle weakness.  At risk for osteopenia and osteoporosis Donna Vasquez is at higher risk of osteopenia and osteoporosis due to vitamin D deficiency.   Hyperlipidemia Donna Vasquez has hyperlipidemia and has been trying to improve her cholesterol levels with intensive lifestyle modification including a low saturated fat diet, exercise and weight loss. She is not on a statin and she denies any myalgias.  Insulin Resistance Donna Vasquez has a diagnosis of insulin resistance based on her elevated fasting insulin level >5. Although Donna Vasquez's blood glucose readings are still under good control, insulin resistance puts her at greater risk of metabolic syndrome and diabetes. She is not taking medications currently and continues to work on diet and exercise to decrease risk of diabetes. She denies carb cravings.  ALLERGIES: Allergies  Allergen Reactions  . Flonase [Fluticasone Propionate] Other (See Comments)    Nasal irritation, burning, but can tolerate advair inhaler.    . Sulfa Antibiotics Hives    MEDICATIONS: Current Outpatient Medications on File Prior to Visit  Medication Sig Dispense  Refill  . albuterol (PROAIR HFA) 108 (90 Base) MCG/ACT inhaler Inhale 1-2 puffs into the lungs every 6 (six) hours as needed for wheezing or shortness of breath. 1 Inhaler 2  . aspirin 81 MG tablet Take 81 mg by mouth daily.    [name] aspirin-acetaminophen-caffeine (EXCEDRIN MIGRAINE) 250-250-65 MG tablet Take by mouth every 6 (six) hours as needed for headache.    [name] buPROPion (WELLBUTRIN XL) 300 MG 24 hr tablet Take 1 tablet (300 mg total) by mouth daily. 90 tablet 3  . dextromethorphan-guaiFENesin (MUCINEX DM) 30-600 MG 12hr tablet Take 1 tablet by mouth 2 (two) times daily as needed for cough.    . Fluticasone-Salmeterol (ADVAIR) 100-50 MCG/DOSE AEPB Inhale 1 puff into the lungs 2 (two) times daily.    . montelukast (SINGULAIR) 10 MG tablet TAKE 1 TABLET (10 MG TOTAL) BY MOUTH AT BEDTIME. 30 tablet 2  . niacin (NIASPAN) 1000 MG CR tablet TAKE 1 TABLET BY MOUTH AT  BEDTIME 90 tablet 1  . omeprazole (PRILOSEC) 20 MG capsule Take 1 capsule (20 mg total) by mouth daily. 30 capsule 2  . sertraline (ZOLOFT) 100 MG tablet TAKE 1 TABLET BY MOUTH  DAILY 90 tablet 1  . simvastatin (ZOCOR) 20 MG tablet TAKE 1 TABLET BY MOUTH  DAILY AT 6 PM. 90 tablet 1  . traZODone (DESYREL) 50 MG tablet Take 0.5-1 tablets (25-50 mg total) by mouth at bedtime as needed for sleep. 30 tablet 3  . triamcinolone cream (KENALOG) 0.1 % Apply 1 application topically 2 (two) times daily as needed. 45 g  1  . Vitamin D, Ergocalciferol, (DRISDOL) 50000 units CAPS capsule Take 1 capsule (50,000 Units total) by mouth every 7 (seven) days. 12 capsule 0   No current facility-administered medications on file prior to visit.     PAST MEDICAL HISTORY: Past Medical History:  Diagnosis Date  . Allergy   . Asthma   . Back pain   . Bulging lumbar disc   . Chicken pox   . Constipation   . Depression   . Fatigue   . Frequent headaches   . Gall bladder disease   . Headache   . Heartburn   . Hyperlipidemia   . IBS (irritable bowel  syndrome)   . Interstitial cystitis   . RA (rheumatoid arthritis) (HCC)   . Shortness of breath on exertion   . Trouble in sleeping   . Urine incontinence   . Vertigo   . Vision changes   . Vitamin D deficiency     PAST SURGICAL HISTORY: Past Surgical History:  Procedure Laterality Date  . CHOLECYSTECTOMY    . cyst removed from back    . NASAL SINUS SURGERY    . TUBAL LIGATION  1999    SOCIAL HISTORY: Social History   Tobacco Use  . Smoking status: Never Smoker  . Smokeless tobacco: Never Used  Substance Use Topics  . Alcohol use: Yes    Alcohol/week: 0.0 standard drinks    Comment: rare--wine  . Drug use: No    FAMILY HISTORY: Family History  Problem Relation Age of Onset  . COPD Mother   . Heart disease Mother   . Polymyalgia rheumatica Mother   . Emphysema Mother   . Stroke Mother   . Depression Mother   . Anxiety disorder Mother   . Heart disease Father   . Hypertension Father   . Hyperlipidemia Father   . Anxiety disorder Father   . Cancer Paternal Aunt        Breast  . Breast cancer Paternal Aunt 89  . Stroke Maternal Grandmother   . Cancer Maternal Aunt     ROS: Review of Systems  Constitutional: Positive for malaise/fatigue and weight loss.  Gastrointestinal: Negative for nausea and vomiting.  Musculoskeletal: Negative for myalgias.       Negative for muscle weakness.  Endo/Heme/Allergies:       Negative for carb cravings.    PHYSICAL EXAM: Blood pressure (!) 141/78, pulse 78, temperature 98 F (36.7 C), temperature source Oral, height 5\' 8"  (1.727 m), weight 230 lb (104.3 kg), SpO2 98 %. Body mass index is 34.97 kg/m. Physical Exam  Constitutional: She is oriented to person, place, and time. She appears well-developed and well-nourished.  Cardiovascular: Normal rate.  Pulmonary/Chest: Effort normal.  Musculoskeletal: Normal range of motion.  Neurological: She is oriented to person, place, and time.  Skin: Skin is warm and dry.    Psychiatric: She has a normal mood and affect. Her behavior is normal.  Vitals reviewed.   RECENT LABS AND TESTS: BMET    Component Value Date/Time   NA 140 04/02/2018 0844   K 3.9 04/02/2018 0844   CL 99 04/02/2018 0844   CO2 23 04/02/2018 0844   GLUCOSE 90 04/02/2018 0844   GLUCOSE 93 11/13/2015 1501   BUN 18 04/02/2018 0844   CREATININE 0.99 04/02/2018 0844   CALCIUM 9.3 04/02/2018 0844   GFRNONAA 65 04/02/2018 0844   GFRAA 75 04/02/2018 0844   Lab Results  Component Value Date   HGBA1C 5.0  04/02/2018   HGBA1C 5.1 11/17/2017   HGBA1C 5.0 11/13/2015   Lab Results  Component Value Date   INSULIN 10.1 04/02/2018   INSULIN 8.7 11/17/2017   CBC    Component Value Date/Time   WBC 6.0 10/16/2017 1000   WBC 8.6 11/13/2015 1501   RBC 4.39 10/16/2017 1000   RBC 4.49 11/13/2015 1501   HGB 13.4 10/16/2017 1000   HCT 40.2 10/16/2017 1000   PLT 229 10/16/2017 1000   MCV 92 10/16/2017 1000   MCH 30.5 10/16/2017 1000   MCHC 33.3 10/16/2017 1000   MCHC 34.0 11/13/2015 1501   RDW 13.3 10/16/2017 1000   LYMPHSABS 1.4 10/16/2017 1000   EOSABS 0.2 10/16/2017 1000   BASOSABS 0.0 10/16/2017 1000   Iron/TIBC/Ferritin/ %Sat No results found for: IRON, TIBC, FERRITIN, IRONPCTSAT Lipid Panel     Component Value Date/Time   CHOL 134 04/02/2018 0844   TRIG 87 04/02/2018 0844   HDL 52 04/02/2018 0844   CHOLHDL 2.6 04/02/2018 0844   CHOLHDL 3 11/13/2015 1501   VLDL 29.8 11/13/2015 1501   LDLCALC 65 04/02/2018 0844   Hepatic Function Panel     Component Value Date/Time   PROT 6.4 04/02/2018 0844   ALBUMIN 4.4 04/02/2018 0844   AST 23 04/02/2018 0844   ALT 22 04/02/2018 0844   ALKPHOS 98 04/02/2018 0844   BILITOT 0.4 04/02/2018 0844      Component Value Date/Time   TSH 1.990 11/17/2017 1158   TSH 2.280 10/16/2017 1000   TSH 2.12 11/13/2015 1501   Results for [facility], Lu R "KATIE" (MRN [id number]) as of 04/07/2018 08:24  Ref. Range 11/17/2017 11:58  Vitamin D,  25-Hydroxy Latest Ref Range: 30.0 - 100.0 ng/mL 26.2 (L)   ASSESSMENT AND PLAN: Vitamin D deficiency - Plan: VITAMIN D 25 Hydroxy (Vit-D Deficiency, Fractures)  Other hyperlipidemia - Plan: Comprehensive metabolic panel, Lipid panel  Insulin resistance - Plan: Hemoglobin A1c, Insulin, random  At risk for osteoporosis  Class 2 severe obesity with serious comorbidity and body mass index (BMI) of 35.0 to 35.9 in adult, unspecified obesity type (HCC)  PLAN:  Vitamin D Deficiency Donna Vasquez was informed that low vitamin D levels contributes to fatigue and are associated with obesity, breast, and colon cancer. She agrees to continue to take prescription Vit D @50 ,000 IU every week and will follow up for routine testing of vitamin D, at least 2-3 times per year. She was informed of the risk of over-replacement of vitamin D and agrees to not increase her dose unless she discusses this with [name] first. We will draw a vitamin D level today. Donna Vasquez agrees to follow up in 2 weeks.  At risk for osteopenia and osteoporosis Donna Vasquez was given extended (15 minutes) osteoporosis prevention counseling today. Donna Vasquez is at risk for osteopenia and osteoporosis due to her vitamin D deficiency. She was encouraged to take her vitamin D and follow her higher calcium diet and increase strengthening exercise to help strengthen her bones and decrease her risk of osteopenia and osteoporosis.  Hyperlipidemia Donna Vasquez was informed of the American Heart Association Guidelines emphasizing intensive lifestyle modifications as the first line treatment for hyperlipidemia. We discussed many lifestyle modifications today in depth, and Donna Vasquez will continue to work on decreasing saturated fats such as fatty red meat, butter and many fried foods. She will also increase vegetables and lean protein in her diet and continue to work on exercise and weight loss efforts. We will draw FLP and CMP today and Donna Vasquez  agrees to follow up as  directed.  Insulin Resistance Donna Vasquez will continue to work on weight loss, exercise, and decreasing simple carbohydrates in her diet to help decrease the risk of diabetes.  She was informed that eating too many simple carbohydrates or too many calories at one sitting increases the likelihood of GI side effects. We will check her Hgb A1c and Insulin today. Donna Vasquez agreed to follow up with [name] as directed to monitor her progress.  Obesity Donna Vasquez is currently in the action stage of change. As such, her goal is to continue with weight loss efforts. She has agreed to follow the Category 2 plan. Donna Vasquez has been instructed to work up to a goal of 150 minutes of combined cardio and strengthening exercise per week for weight loss and overall health benefits. We discussed the following Behavioral Modification Strategies today: increasing lean protein intake, increasing vegetables, work on meal planning and easy cooking plans, and planning for success.  Donna Vasquez has agreed to follow up with our clinic in 2 weeks. She was informed of the importance of frequent follow up visits to maximize her success with intensive lifestyle modifications for her multiple health conditions.   OBESITY BEHAVIORAL INTERVENTION VISIT  Today's visit was # 10   Starting weight: 248 lbs Starting date: 11/17/17 Today's weight : Weight: 230 lb (104.3 kg)  Today's date: 04/02/2018 Total lbs lost to date: 40  ASK: We discussed the diagnosis of obesity with Donna Vasquez today and Donna Vasquez agreed to give [name] permission to discuss obesity behavioral modification therapy today.  ASSESS: Donna Vasquez has the diagnosis of obesity and her BMI today is 34.98. Donna Vasquez is in the action stage of change.   ADVISE: Donna Vasquez was educated on the multiple health risks of obesity as well as the benefit of weight loss to improve her health. She was advised of the need for long term treatment and the importance of lifestyle modifications to  improve her current health and to decrease her risk of future health problems.  AGREE: Multiple dietary modification options and treatment options were discussed and Donna Vasquez agreed to follow the recommendations documented in the above note.  ARRANGE: Donna Vasquez was educated on the importance of frequent visits to treat obesity as outlined per CMS and USPSTF guidelines and agreed to schedule her next follow up appointment today.  I, Kirke Corin, am acting as Energy manager for Filbert Schilder, MD   I have reviewed the above documentation for accuracy and completeness, and I agree with the above. - Debbra Riding, MD

## 2018-04-21 ENCOUNTER — Ambulatory Visit (INDEPENDENT_AMBULATORY_CARE_PROVIDER_SITE_OTHER): Payer: BLUE CROSS/BLUE SHIELD | Admitting: Family Medicine

## 2018-04-21 ENCOUNTER — Other Ambulatory Visit: Payer: Self-pay | Admitting: *Deleted

## 2018-04-21 VITALS — BP 119/85 | HR 80 | Temp 98.1°F | Ht 68.0 in | Wt 229.0 lb

## 2018-04-21 DIAGNOSIS — E7849 Other hyperlipidemia: Secondary | ICD-10-CM | POA: Diagnosis not present

## 2018-04-21 DIAGNOSIS — Z6834 Body mass index (BMI) 34.0-34.9, adult: Secondary | ICD-10-CM

## 2018-04-21 DIAGNOSIS — E559 Vitamin D deficiency, unspecified: Secondary | ICD-10-CM

## 2018-04-21 DIAGNOSIS — E669 Obesity, unspecified: Secondary | ICD-10-CM

## 2018-04-21 DIAGNOSIS — G4719 Other hypersomnia: Secondary | ICD-10-CM

## 2018-04-27 NOTE — Progress Notes (Signed)
Office: [phone]  /  Fax: [phone]   HPI:   Chief Complaint: OBESITY Donna Vasquez is here to discuss her progress with her obesity treatment plan. She is on the Category 2 plan and is following her eating plan approximately 80 % of the time. She states she is exercising 0 minutes 0 times per week. Beckham had a great Thanksgiving and enjoyed indulgent foods, and then got back on track. She leaves for [place] in 4 days and doesn't know what to expect food-wise. She has numerous Christmas parties in next few weeks.  Her weight is 229 lb (103.9 kg) today and has had a weight loss of 1 pound over a period of 2 to 3 weeks since her last visit. She has lost 19 lbs since starting treatment with [name].  Vitamin D Deficiency Donna Vasquez has a diagnosis of vitamin D deficiency. She is currently taking prescription Vit D and denies nausea, vomiting or muscle weakness.  Hyperlipidemia Donna Vasquez has hyperlipidemia and has been trying to improve her cholesterol levels with intensive lifestyle modification including a low saturated fat diet, exercise and weight loss. She is on statin and niacin, and denies any chest pain, claudication or myalgias. Improvement in LDL and triglycerides on recent labs.  ALLERGIES: Allergies  Allergen Reactions  . Flonase [Fluticasone Propionate] Other (See Comments)    Nasal irritation, burning, but can tolerate advair inhaler.    . Sulfa Antibiotics Hives    MEDICATIONS: Current Outpatient Medications on File Prior to Visit  Medication Sig Dispense Refill  . albuterol (PROAIR HFA) 108 (90 Base) MCG/ACT inhaler Inhale 1-2 puffs into the lungs every 6 (six) hours as needed for wheezing or shortness of breath. 1 Inhaler 2  . aspirin 81 MG tablet Take 81 mg by mouth daily.    [name] aspirin-acetaminophen-caffeine (EXCEDRIN MIGRAINE) 250-250-65 MG tablet Take by mouth every 6 (six) hours as needed for headache.    [name] buPROPion (WELLBUTRIN XL) 300 MG 24 hr tablet Take 1 tablet (300  mg total) by mouth daily. 90 tablet 3  . dextromethorphan-guaiFENesin (MUCINEX DM) 30-600 MG 12hr tablet Take 1 tablet by mouth 2 (two) times daily as needed for cough.    . Fluticasone-Salmeterol (ADVAIR) 100-50 MCG/DOSE AEPB Inhale 1 puff into the lungs 2 (two) times daily.    . montelukast (SINGULAIR) 10 MG tablet TAKE 1 TABLET (10 MG TOTAL) BY MOUTH AT BEDTIME. 30 tablet 2  . niacin (NIASPAN) 1000 MG CR tablet TAKE 1 TABLET BY MOUTH AT  BEDTIME 90 tablet 1  . omeprazole (PRILOSEC) 20 MG capsule Take 1 capsule (20 mg total) by mouth daily. 30 capsule 2  . sertraline (ZOLOFT) 100 MG tablet TAKE 1 TABLET BY MOUTH  DAILY 90 tablet 1  . simvastatin (ZOCOR) 20 MG tablet TAKE 1 TABLET BY MOUTH  DAILY AT 6 PM. 90 tablet 1  . traZODone (DESYREL) 50 MG tablet Take 0.5-1 tablets (25-50 mg total) by mouth at bedtime as needed for sleep. 30 tablet 3  . triamcinolone cream (KENALOG) 0.1 % Apply 1 application topically 2 (two) times daily as needed. 45 g 1  . Vitamin D, Ergocalciferol, (DRISDOL) 50000 units CAPS capsule Take 1 capsule (50,000 Units total) by mouth every 7 (seven) days. 12 capsule 0   No current facility-administered medications on file prior to visit.     PAST MEDICAL HISTORY: Past Medical History:  Diagnosis Date  . Allergy   . Asthma   . Back pain   . Bulging lumbar disc   .  Chicken pox   . Constipation   . Depression   . Fatigue   . Frequent headaches   . Gall bladder disease   . Headache   . Heartburn   . Hyperlipidemia   . IBS (irritable bowel syndrome)   . Interstitial cystitis   . RA (rheumatoid arthritis) (HCC)   . Shortness of breath on exertion   . Trouble in sleeping   . Urine incontinence   . Vertigo   . Vision changes   . Vitamin D deficiency     PAST SURGICAL HISTORY: Past Surgical History:  Procedure Laterality Date  . CHOLECYSTECTOMY    . cyst removed from back    . NASAL SINUS SURGERY    . TUBAL LIGATION  1999    SOCIAL HISTORY: Social  History   Tobacco Use  . Smoking status: Never Smoker  . Smokeless tobacco: Never Used  Substance Use Topics  . Alcohol use: Yes    Alcohol/week: 0.0 standard drinks    Comment: rare--wine  . Drug use: No    FAMILY HISTORY: Family History  Problem Relation Age of Onset  . COPD Mother   . Heart disease Mother   . Polymyalgia rheumatica Mother   . Emphysema Mother   . Stroke Mother   . Depression Mother   . Anxiety disorder Mother   . Heart disease Father   . Hypertension Father   . Hyperlipidemia Father   . Anxiety disorder Father   . Cancer Paternal Aunt        Breast  . Breast cancer Paternal Aunt 47  . Stroke Maternal Grandmother   . Cancer Maternal Aunt     ROS: Review of Systems  Constitutional: Positive for malaise/fatigue and weight loss.  Cardiovascular: Negative for chest pain and claudication.  Gastrointestinal: Negative for nausea and vomiting.  Musculoskeletal: Negative for myalgias.       Negative muscle weakness    PHYSICAL EXAM: Blood pressure 119/85, pulse 80, temperature 98.1 F (36.7 C), temperature source Oral, height 5\' 8"  (1.727 m), weight 229 lb (103.9 kg), SpO2 94 %. Body mass index is 34.82 kg/m. Physical Exam  Constitutional: She is oriented to person, place, and time. She appears well-developed and well-nourished.  Cardiovascular: Normal rate.  Pulmonary/Chest: Effort normal.  Musculoskeletal: Normal range of motion.  Neurological: She is oriented to person, place, and time.  Skin: Skin is warm and dry.  Psychiatric: She has a normal mood and affect. Her behavior is normal.  Vitals reviewed.   RECENT LABS AND TESTS: BMET    Component Value Date/Time   NA 140 04/02/2018 0844   K 3.9 04/02/2018 0844   CL 99 04/02/2018 0844   CO2 23 04/02/2018 0844   GLUCOSE 90 04/02/2018 0844   GLUCOSE 93 11/13/2015 1501   BUN 18 04/02/2018 0844   CREATININE 0.99 04/02/2018 0844   CALCIUM 9.3 04/02/2018 0844   GFRNONAA 65 04/02/2018 0844    GFRAA 75 04/02/2018 0844   Lab Results  Component Value Date   HGBA1C 5.0 04/02/2018   HGBA1C 5.1 11/17/2017   HGBA1C 5.0 11/13/2015   Lab Results  Component Value Date   INSULIN 10.1 04/02/2018   INSULIN 8.7 11/17/2017   CBC    Component Value Date/Time   WBC 6.0 10/16/2017 1000   WBC 8.6 11/13/2015 1501   RBC 4.39 10/16/2017 1000   RBC 4.49 11/13/2015 1501   HGB 13.4 10/16/2017 1000   HCT 40.2 10/16/2017 1000   PLT 229  10/16/2017 1000   MCV 92 10/16/2017 1000   MCH 30.5 10/16/2017 1000   MCHC 33.3 10/16/2017 1000   MCHC 34.0 11/13/2015 1501   RDW 13.3 10/16/2017 1000   LYMPHSABS 1.4 10/16/2017 1000   EOSABS 0.2 10/16/2017 1000   BASOSABS 0.0 10/16/2017 1000   Iron/TIBC/Ferritin/ %Sat No results found for: IRON, TIBC, FERRITIN, IRONPCTSAT Lipid Panel     Component Value Date/Time   CHOL 134 04/02/2018 0844   TRIG 87 04/02/2018 0844   HDL 52 04/02/2018 0844   CHOLHDL 2.6 04/02/2018 0844   CHOLHDL 3 11/13/2015 1501   VLDL 29.8 11/13/2015 1501   LDLCALC 65 04/02/2018 0844   Hepatic Function Panel     Component Value Date/Time   PROT 6.4 04/02/2018 0844   ALBUMIN 4.4 04/02/2018 0844   AST 23 04/02/2018 0844   ALT 22 04/02/2018 0844   ALKPHOS 98 04/02/2018 0844   BILITOT 0.4 04/02/2018 0844      Component Value Date/Time   TSH 1.990 11/17/2017 1158   TSH 2.280 10/16/2017 1000   TSH 2.12 11/13/2015 1501  Results for [facility], Nyelah R "KATIE" (MRN [id number]) as of 04/27/2018 10:09  Ref. Range 04/02/2018 08:44  Vitamin D, 25-Hydroxy Latest Ref Range: 30.0 - 100.0 ng/mL 42.5    ASSESSMENT AND PLAN: Vitamin D deficiency  Other hyperlipidemia  Class 1 obesity with serious comorbidity and body mass index (BMI) of 34.0 to 34.9 in adult, unspecified obesity type  PLAN:  Vitamin D Deficiency Donna Vasquez was informed that low vitamin D levels contributes to fatigue and are associated with obesity, breast, and colon cancer. Donna Vasquez agrees to continue taking  prescription Vit D @50 ,000 IU every week and will follow up for routine testing of vitamin D, at least 2-3 times per year. She was informed of the risk of over-replacement of vitamin D and agrees to not increase her dose unless she discusses this with first. Donna Vasquez agrees to follow up with our clinic in 2 weeks.  Hyperlipidemia Donna Vasquez was informed of the American Heart Association Guidelines emphasizing intensive lifestyle modifications as the first line treatment for hyperlipidemia. We discussed many lifestyle modifications today in depth, and Donna Vasquez will continue to work on decreasing saturated fats such as fatty red meat, butter and many fried foods. Donna Vasquez agrees to continue taking statin, and she will also increase vegetables and lean protein in her diet and continue to work on exercise and weight loss efforts. Donna Vasquez agrees to follow up with our clinic in 2 weeks.  I spent > than 50% of the 15 minute visit on counseling as documented in the note.  Obesity Donna Vasquez is currently in the action stage of change. As such, her goal is to continue with weight loss efforts She has agreed to follow the Category 2 plan Donna Vasquez has been instructed to work up to a goal of 150 minutes of combined cardio and strengthening exercise per week for weight loss and overall health benefits. We discussed the following Behavioral Modification Strategies today: increasing lean protein intake, increasing vegetables, dealing with family or coworker sabotage, holiday eating strategies, travel eating strategies, and celebration eating strategies Donna Vasquez has thoughts about doing yoga starting in the New Year. She has a TV for spare bedroom she just started.  Donna Vasquez has agreed to follow up with our clinic in 2 weeks. She was informed of the importance of frequent follow up visits to maximize her success with intensive lifestyle modifications for her multiple health conditions.   OBESITY BEHAVIORAL INTERVENTION  VISIT  Today's visit was # 11   Starting weight: 248 lbs Starting date: 11/17/17 Today's weight : 229 lbs Today's date: 04/21/2018 Total lbs lost to date: 98    ASK: We discussed the diagnosis of obesity with Donna Vasquez today and Donna Vasquez agreed to give [name] permission to discuss obesity behavioral modification therapy today.  ASSESS: Donna Vasquez has the diagnosis of obesity and her BMI today is 34.83 Donna Vasquez is in the action stage of change   ADVISE: Donna Vasquez was educated on the multiple health risks of obesity as well as the benefit of weight loss to improve her health. She was advised of the need for long term treatment and the importance of lifestyle modifications to improve her current health and to decrease her risk of future health problems.  AGREE: Multiple dietary modification options and treatment options were discussed and  Donna Vasquez agreed to follow the recommendations documented in the above note.  ARRANGE: Donna Vasquez was educated on the importance of frequent visits to treat obesity as outlined per CMS and USPSTF guidelines and agreed to schedule her next follow up appointment today.  I, Burt Knack, am acting as transcriptionist for Debbra Riding, MD  I have reviewed the above documentation for accuracy and completeness, and I agree with the above. - Debbra Riding, MD

## 2018-05-06 ENCOUNTER — Ambulatory Visit (INDEPENDENT_AMBULATORY_CARE_PROVIDER_SITE_OTHER): Payer: BLUE CROSS/BLUE SHIELD | Admitting: Family Medicine

## 2018-05-06 VITALS — BP 150/85 | HR 87 | Temp 98.3°F | Ht 68.0 in | Wt 230.0 lb

## 2018-05-06 DIAGNOSIS — E559 Vitamin D deficiency, unspecified: Secondary | ICD-10-CM | POA: Diagnosis not present

## 2018-05-06 DIAGNOSIS — R030 Elevated blood-pressure reading, without diagnosis of hypertension: Secondary | ICD-10-CM | POA: Diagnosis not present

## 2018-05-06 DIAGNOSIS — Z6835 Body mass index (BMI) 35.0-35.9, adult: Secondary | ICD-10-CM | POA: Diagnosis not present

## 2018-05-07 NOTE — Progress Notes (Signed)
Office: [phone]  /  Fax: [phone]   HPI:   Chief Complaint: OBESITY Donna Vasquez is here to discuss her progress with her obesity treatment plan. She is on the Category 2 plan and is following her eating plan approximately 50 % of the time. She states she is exercising 0 minutes 0 times per week. Donna Vasquez had a trip to [place] to visit her aunt for 4 days then had Christmas parties in the past week. She knows she has struggled to stay on track. She plans to make Christmas cookies and celebration Christmas meal.  Her weight is 230 lb (104.3 kg) today and has gained 1 pound since her last visit. She has lost 18 lbs since starting treatment with [name].  Elevated Blood Pressure Donna Vasquez's blood pressure is elevated. She was feeling anxious about her appointment. She denies chest pain, chest pressure, or headaches. She is working weight loss to help control her blood pressure with the goal of decreasing her risk of heart attack and stroke. Donna Vasquez's blood pressure is not currently controlled.  Vitamin D Deficiency Donna Vasquez has a diagnosis of vitamin D deficiency. She is currently taking prescription Vit D. She notes fatigue and denies nausea, vomiting or muscle weakness.  ASSESSMENT AND PLAN:  Elevated blood pressure reading  Vitamin D deficiency  Class 2 severe obesity with serious comorbidity and body mass index (BMI) of 35.0 to 35.9 in adult, unspecified obesity type (HCC)  PLAN:  Elevated Blood Pressure We discussed sodium restriction, working on healthy weight loss, and a regular exercise program as the means to achieve improved blood pressure control. Donna Vasquez agreed with this plan and agreed to follow up as directed. We will continue to monitor her blood pressure as well as her progress with the above lifestyle modifications. She will watch for signs of hypotension as she continues her lifestyle modifications. We will follow up on blood pressure at next appointment. Donna Vasquez agrees to  follow up with our clinic in 2 weeks.  Vitamin D Deficiency Donna Vasquez was informed that low vitamin D levels contributes to fatigue and are associated with obesity, breast, and colon cancer. Donna Vasquez agrees to continue taking prescription Vit D @50 ,000 IU every week and will follow up for routine testing of vitamin D, at least 2-3 times per year. She was informed of the risk of over-replacement of vitamin D and agrees to not increase her dose unless she discusses this with first. Donna Vasquez agrees to follow up with our clinic in 2 weeks.  I spent > than 50% of the 15 minute visit on counseling as documented in the note.  Obesity Donna Vasquez is currently in the action stage of change. As such, her goal is to continue with weight loss efforts She has agreed to follow the Category 2 plan Donna Vasquez has been instructed to work up to a goal of 150 minutes of combined cardio and strengthening exercise per week for weight loss and overall health benefits. We discussed the following Behavioral Modification Strategies today: increasing lean protein intake, work on meal planning and easy cooking plans, dealing with family or coworker sabotage, holiday eating strategies, and planning for success  We will discuss physical activity in January.  Donna Vasquez has agreed to follow up with our clinic in 2 weeks. She was informed of the importance of frequent follow up visits to maximize her success with intensive lifestyle modifications for her multiple health conditions.  ALLERGIES: Allergies  Allergen Reactions  . Flonase [Fluticasone Propionate] Other (See Comments)  Nasal irritation, burning, but can tolerate advair inhaler.    . Sulfa Antibiotics Hives    MEDICATIONS: Current Outpatient Medications on File Prior to Visit  Medication Sig Dispense Refill  . albuterol (PROAIR HFA) 108 (90 Base) MCG/ACT inhaler Inhale 1-2 puffs into the lungs every 6 (six) hours as needed for wheezing or shortness of breath. 1  Inhaler 2  . aspirin 81 MG tablet Take 81 mg by mouth daily.    [name] aspirin-acetaminophen-caffeine (EXCEDRIN MIGRAINE) 250-250-65 MG tablet Take by mouth every 6 (six) hours as needed for headache.    [name] buPROPion (WELLBUTRIN XL) 300 MG 24 hr tablet Take 1 tablet (300 mg total) by mouth daily. 90 tablet 3  . dextromethorphan-guaiFENesin (MUCINEX DM) 30-600 MG 12hr tablet Take 1 tablet by mouth 2 (two) times daily as needed for cough.    . Fluticasone-Salmeterol (ADVAIR) 100-50 MCG/DOSE AEPB Inhale 1 puff into the lungs 2 (two) times daily.    . montelukast (SINGULAIR) 10 MG tablet TAKE 1 TABLET (10 MG TOTAL) BY MOUTH AT BEDTIME. 30 tablet 2  . niacin (NIASPAN) 1000 MG CR tablet TAKE 1 TABLET BY MOUTH AT  BEDTIME 90 tablet 1  . omeprazole (PRILOSEC) 20 MG capsule Take 1 capsule (20 mg total) by mouth daily. 30 capsule 2  . sertraline (ZOLOFT) 100 MG tablet TAKE 1 TABLET BY MOUTH  DAILY 90 tablet 1  . simvastatin (ZOCOR) 20 MG tablet TAKE 1 TABLET BY MOUTH  DAILY AT 6 PM. 90 tablet 1  . traZODone (DESYREL) 50 MG tablet Take 0.5-1 tablets (25-50 mg total) by mouth at bedtime as needed for sleep. 30 tablet 3  . triamcinolone cream (KENALOG) 0.1 % Apply 1 application topically 2 (two) times daily as needed. 45 g 1  . Vitamin D, Ergocalciferol, (DRISDOL) 50000 units CAPS capsule Take 1 capsule (50,000 Units total) by mouth every 7 (seven) days. 12 capsule 0   No current facility-administered medications on file prior to visit.     PAST MEDICAL HISTORY: Past Medical History:  Diagnosis Date  . Allergy   . Asthma   . Back pain   . Bulging lumbar disc   . Chicken pox   . Constipation   . Depression   . Fatigue   . Frequent headaches   . Gall bladder disease   . Headache   . Heartburn   . Hyperlipidemia   . IBS (irritable bowel syndrome)   . Interstitial cystitis   . RA (rheumatoid arthritis) (HCC)   . Shortness of breath on exertion   . Trouble in sleeping   . Urine incontinence   .  Vertigo   . Vision changes   . Vitamin D deficiency     PAST SURGICAL HISTORY: Past Surgical History:  Procedure Laterality Date  . CHOLECYSTECTOMY    . cyst removed from back    . NASAL SINUS SURGERY    . TUBAL LIGATION  1999    SOCIAL HISTORY: Social History   Tobacco Use  . Smoking status: Never Smoker  . Smokeless tobacco: Never Used  Substance Use Topics  . Alcohol use: Yes    Alcohol/week: 0.0 standard drinks    Comment: rare--wine  . Drug use: No    FAMILY HISTORY: Family History  Problem Relation Age of Onset  . COPD Mother   . Heart disease Mother   . Polymyalgia rheumatica Mother   . Emphysema Mother   . Stroke Mother   . Depression Mother   .  Anxiety disorder Mother   . Heart disease Father   . Hypertension Father   . Hyperlipidemia Father   . Anxiety disorder Father   . Cancer Paternal Aunt        Breast  . Breast cancer Paternal Aunt 93  . Stroke Maternal Grandmother   . Cancer Maternal Aunt     ROS: Review of Systems  Constitutional: Positive for malaise/fatigue. Negative for weight loss.  Cardiovascular: Negative for chest pain.       Negative chest pressure  Neurological: Negative for headaches.    PHYSICAL EXAM: Blood pressure (!) 150/85, pulse 87, temperature 98.3 F (36.8 C), temperature source Oral, height 5\' 8"  (1.727 m), weight 230 lb (104.3 kg), last menstrual period 04/28/2018, SpO2 98 %. Body mass index is 34.97 kg/m. Physical Exam Vitals signs reviewed.  Constitutional:      Appearance: Normal appearance. She is obese.  Cardiovascular:     Rate and Rhythm: Normal rate.     Pulses: Normal pulses.  Pulmonary:     Effort: Pulmonary effort is normal.  Musculoskeletal: Normal range of motion.  Skin:    General: Skin is warm and dry.  Neurological:     Mental Status: She is alert and oriented to person, place, and time.  Psychiatric:        Mood and Affect: Mood normal.        Behavior: Behavior normal.     RECENT  LABS AND TESTS: BMET    Component Value Date/Time   NA 140 04/02/2018 0844   K 3.9 04/02/2018 0844   CL 99 04/02/2018 0844   CO2 23 04/02/2018 0844   GLUCOSE 90 04/02/2018 0844   GLUCOSE 93 11/13/2015 1501   BUN 18 04/02/2018 0844   CREATININE 0.99 04/02/2018 0844   CALCIUM 9.3 04/02/2018 0844   GFRNONAA 65 04/02/2018 0844   GFRAA 75 04/02/2018 0844   Lab Results  Component Value Date   HGBA1C 5.0 04/02/2018   HGBA1C 5.1 11/17/2017   HGBA1C 5.0 11/13/2015   Lab Results  Component Value Date   INSULIN 10.1 04/02/2018   INSULIN 8.7 11/17/2017   CBC    Component Value Date/Time   WBC 6.0 10/16/2017 1000   WBC 8.6 11/13/2015 1501   RBC 4.39 10/16/2017 1000   RBC 4.49 11/13/2015 1501   HGB 13.4 10/16/2017 1000   HCT 40.2 10/16/2017 1000   PLT 229 10/16/2017 1000   MCV 92 10/16/2017 1000   MCH 30.5 10/16/2017 1000   MCHC 33.3 10/16/2017 1000   MCHC 34.0 11/13/2015 1501   RDW 13.3 10/16/2017 1000   LYMPHSABS 1.4 10/16/2017 1000   EOSABS 0.2 10/16/2017 1000   BASOSABS 0.0 10/16/2017 1000   Iron/TIBC/Ferritin/ %Sat No results found for: IRON, TIBC, FERRITIN, IRONPCTSAT Lipid Panel     Component Value Date/Time   CHOL 134 04/02/2018 0844   TRIG 87 04/02/2018 0844   HDL 52 04/02/2018 0844   CHOLHDL 2.6 04/02/2018 0844   CHOLHDL 3 11/13/2015 1501   VLDL 29.8 11/13/2015 1501   LDLCALC 65 04/02/2018 0844   Hepatic Function Panel     Component Value Date/Time   PROT 6.4 04/02/2018 0844   ALBUMIN 4.4 04/02/2018 0844   AST 23 04/02/2018 0844   ALT 22 04/02/2018 0844   ALKPHOS 98 04/02/2018 0844   BILITOT 0.4 04/02/2018 0844      Component Value Date/Time   TSH 1.990 11/17/2017 1158   TSH 2.280 10/16/2017 1000   TSH 2.12 11/13/2015  1501      OBESITY BEHAVIORAL INTERVENTION VISIT  Today's visit was # 12   Starting weight: 248 lbs Starting date: 11/17/17 Today's weight : 230 lbs Today's date: 05/06/2018 Total lbs lost to date: 45    ASK: We  discussed the diagnosis of obesity with Donna Vasquez today and Donna Vasquez agreed to give [name] permission to discuss obesity behavioral modification therapy today.  ASSESS: Donna Vasquez has the diagnosis of obesity and her BMI today is 34.98 Donna Vasquez is in the action stage of change   ADVISE: Donna Vasquez was educated on the multiple health risks of obesity as well as the benefit of weight loss to improve her health. She was advised of the need for long term treatment and the importance of lifestyle modifications to improve her current health and to decrease her risk of future health problems.  AGREE: Multiple dietary modification options and treatment options were discussed and  Donna Vasquez agreed to follow the recommendations documented in the above note.  ARRANGE: Donna Vasquez was educated on the importance of frequent visits to treat obesity as outlined per CMS and USPSTF guidelines and agreed to schedule her next follow up appointment today.  I, Burt Knack, am acting as transcriptionist for Debbra Riding, MD  I have reviewed the above documentation for accuracy and completeness, and I agree with the above. - Debbra Riding, MD

## 2018-05-25 ENCOUNTER — Encounter: Payer: Self-pay | Admitting: Family Medicine

## 2018-05-25 ENCOUNTER — Ambulatory Visit (INDEPENDENT_AMBULATORY_CARE_PROVIDER_SITE_OTHER): Payer: Managed Care, Other (non HMO) | Admitting: Family Medicine

## 2018-05-25 VITALS — BP 124/82 | HR 76 | Temp 98.1°F | Ht 68.0 in | Wt 233.0 lb

## 2018-05-25 DIAGNOSIS — G44229 Chronic tension-type headache, not intractable: Secondary | ICD-10-CM | POA: Diagnosis not present

## 2018-05-25 DIAGNOSIS — G4733 Obstructive sleep apnea (adult) (pediatric): Secondary | ICD-10-CM

## 2018-05-25 DIAGNOSIS — F339 Major depressive disorder, recurrent, unspecified: Secondary | ICD-10-CM

## 2018-05-25 DIAGNOSIS — R030 Elevated blood-pressure reading, without diagnosis of hypertension: Secondary | ICD-10-CM | POA: Insufficient documentation

## 2018-05-25 DIAGNOSIS — E669 Obesity, unspecified: Secondary | ICD-10-CM | POA: Diagnosis not present

## 2018-05-25 DIAGNOSIS — R0683 Snoring: Secondary | ICD-10-CM | POA: Diagnosis not present

## 2018-05-25 DIAGNOSIS — E559 Vitamin D deficiency, unspecified: Secondary | ICD-10-CM

## 2018-05-25 MED ORDER — VITAMIN D (ERGOCALCIFEROL) 1.25 MG (50000 UNIT) PO CAPS: 50000.0000 [IU] | ORAL_CAPSULE | ORAL | 0 refills | Status: DC

## 2018-05-25 NOTE — Assessment & Plan Note (Signed)
Pulmonary agrees she has a high probability of having OSA. Home sleep study ordered by pulmonary.

## 2018-05-25 NOTE — Assessment & Plan Note (Signed)
[facility]

## 2018-05-25 NOTE — Progress Notes (Signed)
Subjective:   Patient ID: Donna Vasquez, female    DOB: 1965-06-26, 53 y.o.   MRN: [id number]  Donna Vasquez is a pleasant 53 y.o. year old female who presents to clinic today with Follow-up (Patient is here today for a 5347-month-F/U.  At 10.10.19 visit pt agreed to referral to Pulmonology for snoring.  Erin FullingKurian Kasa, MD advised sleep study, weight loss, diet change and to increase activity.  The order is active for the sleep study.  She is also to F/U with Tension H/A vs Migrainous component.  She agreed to keep a H/A journal and to F/U in 3 months.  OV with Debbra RidingKadolph [place] U,MD on 12.18.19 her BP was 150/85.  At 12.3.19 visit her BP was 119.85.  At 11.14.19 visit BP was 141/78.  Then ) and addendum (previous to that on 10.30.19 her BP was 146.91.  Vitamin D was found to be low per 7.1.19 lab work at 26.2 but after Rx of vit-D completed this normalized per lab draw on 11.14.19 was 42.5. She does not have any outside readings. Requesting refill of Vit-D.)  on 05/25/2018  HPI:  Here for follow up.  Last saw patient on 02/26/18. Note reviewed-  Referred her to pulmonary for sleep study due to snoring.  She saw pulmonary, DrBelia Heman.. Kasa, on 03/19/18.  Note reviewed.  He felt she has a high probability of having OSA. Home sleep study ordered.  Per pt- indicated severe sleep apnea on her back but only mild on her sides. CPAP starting in January 2020.  HA- more likely both tension headaches and possibly migraine variants.  Advised to keep a HA journal. Has only had two since she saw me.  She did get new glasses since I last her. Sleeping better- journal showed her that a trigger is lack of sleep.  Obesity- has been [facility]inal[facility]Kadolph [facility] Was last seen by her on 12//18/19- note reviewed. Blood pressure was elevated at that OV and she was advised to work on sodium restriction, weight loss and regular exercise. Normotensive today here. BP Readings from Last 3  Encounters:  05/25/18 124/82  05/06/18 (!) 150/85  04/21/18 119/85   Wt Readings from Last 3 Encounters:  05/25/18 233 lb (105.7 kg)  05/06/18 230 lb (104.3 kg)  04/21/18 229 lb (103.9 kg)   Vit D deficiency- found to be low in July, has improved with repletion. Went from 26.2 in July to 42.5 in November.  Asking for refill of Vitamin D today.  Depression/anxiety- currently taking zoloft 100 mg daily and wellbutin 150 mg XL daily. Trazodone for sleep. She does feel her symptoms are much better.  [facility] 11/17/2017 10/16/2017 09/15/2017 02/20/2017 02/05/2017  Decreased Interest 3 1 3  - 0  Down, Depressed, Hopeless 3 1 3  0 0  PHQ - 2 Score 6 2 6  0 0  Altered sleeping 2 2 3  - -  Tired, decreased energy 3 2 3  - -  Change in appetite 3 2 3  - -  Feeling bad or failure about yourself  0 2 3 - -  Trouble concentrating 3 1 3  - -  Moving slowly or fidgety/restless 0 0 0 - -  Suicidal thoughts 0 0 0 - -  PHQ-9 Score 17 11 21  - -  Difficult doing work/chores Somewhat difficult Very difficult Very difficult - -   GAD 7 : Generalized Anxiety Score 10/16/2017 09/15/2017  Nervous, Anxious, on Edge 1 1  Control/stop worrying 1 1  Worry too much - different things 0 1  Trouble relaxing 0 1  Restless 0 0  Easily annoyed or irritable 2 2  Afraid - awful might happen 0 1  Total GAD 7 Score 4 7  Anxiety Difficulty Very difficult Somewhat difficult     Current Outpatient Medications on File Prior to Visit  Medication Sig Dispense Refill  . albuterol (PROAIR HFA) 108 (90 Base) MCG/ACT inhaler Inhale 1-2 puffs into the lungs every 6 (six) hours as needed for wheezing or shortness of breath. 1 Inhaler 2  . aspirin 81 MG tablet Take 81 mg by mouth daily.    [name] aspirin-acetaminophen-caffeine (EXCEDRIN MIGRAINE) 250-250-65 MG tablet Take by mouth every 6 (six) hours as needed for headache.    [name] buPROPion (WELLBUTRIN XL) 300 MG 24 hr tablet Take 1 tablet (300 mg total) by mouth daily. 90  tablet 3  . dextromethorphan-guaiFENesin (MUCINEX DM) 30-600 MG 12hr tablet Take 1 tablet by mouth 2 (two) times daily as needed for cough.    . Fluticasone-Salmeterol (ADVAIR) 100-50 MCG/DOSE AEPB Inhale 1 puff into the lungs 2 (two) times daily.    . montelukast (SINGULAIR) 10 MG tablet TAKE 1 TABLET (10 MG TOTAL) BY MOUTH AT BEDTIME. 30 tablet 2  . niacin (NIASPAN) 1000 MG CR tablet TAKE 1 TABLET BY MOUTH AT  BEDTIME 90 tablet 1  . omeprazole (PRILOSEC) 20 MG capsule Take 1 capsule (20 mg total) by mouth daily. 30 capsule 2  . sertraline (ZOLOFT) 100 MG tablet TAKE 1 TABLET BY MOUTH  DAILY 90 tablet 1  . simvastatin (ZOCOR) 20 MG tablet TAKE 1 TABLET BY MOUTH  DAILY AT 6 PM. 90 tablet 1  . traZODone (DESYREL) 50 MG tablet Take 0.5-1 tablets (25-50 mg total) by mouth at bedtime as needed for sleep. 30 tablet 3  . triamcinolone cream (KENALOG) 0.1 % Apply 1 application topically 2 (two) times daily as needed. 45 g 1   No current facility-administered medications on file prior to visit.     Allergies  Allergen Reactions  . Flonase [Fluticasone Propionate] Other (See Comments)    Nasal irritation, burning, but can tolerate advair inhaler.    . Sulfa Antibiotics Hives    Past Medical History:  Diagnosis Date  . Allergy   . Asthma   . Back pain   . Bulging lumbar disc   . Chicken pox   . Constipation   . Depression   . Fatigue   . Frequent headaches   . Gall bladder disease   . Headache   . Heartburn   . Hyperlipidemia   . IBS (irritable bowel syndrome)   . Interstitial cystitis   . RA (rheumatoid arthritis) (HCC)   . Shortness of breath on exertion   . Trouble in sleeping   . Urine incontinence   . Vertigo   . Vision changes   . Vitamin D deficiency     Past Surgical History:  Procedure Laterality Date  . CHOLECYSTECTOMY    . cyst removed from back    . NASAL SINUS SURGERY    . TUBAL LIGATION  1999    Family History  Problem Relation Age of Onset  . COPD Mother    . Heart disease Mother   . Polymyalgia rheumatica Mother   . Emphysema Mother   . Stroke Mother   . Depression Mother   . Anxiety disorder Mother   . Heart disease Father   . Hypertension  Father   . Hyperlipidemia Father   . Anxiety disorder Father   . Cancer Paternal Aunt        Breast  . Breast cancer Paternal Aunt [id number]  . Stroke Maternal Grandmother   . Cancer Maternal Aunt     Social History   Socioeconomic History  . Marital status: Married    Spouse name: Jesusita OkaDan  . Number of children: 2  . Years of education: Not on file  . Highest education level: Not on file  Occupational History  . 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 Financial resource strain: Not on file  . Food insecurity:    Worry: Not on file    Inability: Not on file  . Transportation needs:    Medical: Not on file    Non-medical: Not on file  Tobacco Use  . Smoking status: Never Smoker  . Smokeless tobacco: Never Used  Substance and Sexual Activity  . Alcohol use: Yes    Alcohol/week: 0.0 standard drinks    Comment: rare--wine  . Drug use: No  . Sexual activity: Not on file  Lifestyle  . Physical activity:    Days per week: Not on file    Minutes per session: Not on file  . Stress: Not on file  Relationships  . Social connections:    Talks on phone: Not on file    Gets together: Not on file    Attends religious service: Not on file    Active member of club or organization: Not on file    Attends meetings of clubs or organizations: Not on file    Relationship status: Not on file  . Intimate partner violence:    Fear of current or ex partner: Not on file    Emotionally abused: Not on file    Physically abused: Not on file    Forced sexual activity: Not on file  Other Topics Concern  . Not on file  Social History Narrative  . Not on file   The PMH, PSH, Social History, Family History, Medications, and allergies have been [facility], and have been updated if  relevant.   Review of Systems  Constitutional: Negative.   HENT: Negative.   Eyes: Negative.   Respiratory: Negative.   Cardiovascular: Negative.   Gastrointestinal: Negative.   Endocrine: Negative.   Genitourinary: Negative.   Musculoskeletal: Negative.   Skin: Negative.   Allergic/Immunologic: Negative.   Neurological: Negative.   Hematological: Negative.   Psychiatric/Behavioral: Positive for decreased concentration and sleep disturbance. Negative for agitation, behavioral problems, confusion, dysphoric mood, hallucinations, self-injury and suicidal ideas. The patient is not nervous/anxious and is not hyperactive.   All other systems reviewed and are negative.      Objective:    BP 124/82 (BP Location: Left Arm, Patient Position: Sitting, Cuff Size: Normal)   Pulse 76   Temp 98.1 F (36.7 C) (Oral)   Ht 5\' 8"  (1.727 m)   Wt 233 lb (105.7 kg)   LMP 04/28/2018   SpO2 97%   BMI 35.43 kg/m    Physical Exam Vitals signs and nursing note reviewed.  Constitutional:      General: She is not in acute distress.    Appearance: Normal appearance. She is obese. She is not ill-appearing or toxic-appearing.  HENT:     Head: Normocephalic and atraumatic.     Right Ear: Tympanic membrane normal.     Left Ear: Tympanic membrane normal.     Nose:  Nose normal.     Mouth/Throat:     Mouth: Mucous membranes are moist.  Eyes:     Extraocular Movements: Extraocular movements intact.  Neck:     Musculoskeletal: Normal range of motion.  Cardiovascular:     Rate and Rhythm: Normal rate and regular rhythm.     Pulses: Normal pulses.     Heart sounds: Normal heart sounds.  Pulmonary:     Effort: Pulmonary effort is normal.     Breath sounds: Normal breath sounds.  Musculoskeletal: Normal range of motion.        General: No swelling.  Skin:    General: Skin is warm.  Neurological:     General: No focal deficit present.     Mental Status: She is alert and oriented to person,  place, and time. Mental status is at baseline.     Cranial Nerves: No cranial nerve deficit.  Psychiatric:        Mood and Affect: Mood normal.        Behavior: Behavior normal.        Thought Content: Thought content normal.        Judgment: Judgment normal.           Assessment & Plan:   Snoring  Obesity (BMI 35.0-39.9 without comorbidity)  Chronic tension-type headache, not intractable  Transient elevated blood pressure  Vitamin D deficiency - Plan: Vitamin D, Ergocalciferol, (DRISDOL) 1.25 MG (50000 UT) CAPS capsule No follow-ups on file.

## 2018-05-25 NOTE — Assessment & Plan Note (Signed)
Will be using CPAP starting this month.  Until then, sleeping on her side.

## 2018-05-25 NOTE — Patient Instructions (Signed)
Great to see you! You're doing such a great job!   Keep me updated after you start your CPAP.

## 2018-05-25 NOTE — Assessment & Plan Note (Addendum)
Normotensive here today.  Looking at previous readings, mostly normotensive as well. Reassurance provided. Advised continued work on diet and exercise, sodium restriction. Treating sleep apnea should help with blood pressure as well.

## 2018-05-25 NOTE — Assessment & Plan Note (Addendum)
>  40 minutes spent in face to face time with patient, >50% spent in counselling or coordination of care discussing headaches, depression, OSA, blood pressure.  Symptoms improved on current dose of zoloft and wellbutrin. No changes made today.  Depression screen [facility] 2/9 05/25/2018 11/17/2017 10/16/2017 09/15/2017 02/20/2017  Decreased Interest 1 3 1 3  -  Down, Depressed, Hopeless 1 3 1 3  0  PHQ - 2 Score 2 6 2 6  0  Altered sleeping 0 2 2 3  -  Tired, decreased energy 1 3 2 3  -  Change in appetite 0 3 2 3  -  Feeling bad or failure about yourself  1 0 2 3 -  Trouble concentrating 1 3 1 3  -  Moving slowly or fidgety/restless 0 0 0 0 -  Suicidal thoughts 0 0 0 0 -  PHQ-9 Score 5 17 11 21  -  Difficult doing work/chores Somewhat difficult Somewhat difficult Very difficult Very difficult -

## 2018-05-25 NOTE — Assessment & Plan Note (Signed)
Improving. Continue keeping a headache journal if needed.

## 2018-05-28 ENCOUNTER — Ambulatory Visit (INDEPENDENT_AMBULATORY_CARE_PROVIDER_SITE_OTHER): Payer: Managed Care, Other (non HMO) | Admitting: Family Medicine

## 2018-05-28 ENCOUNTER — Encounter (INDEPENDENT_AMBULATORY_CARE_PROVIDER_SITE_OTHER): Payer: Self-pay | Admitting: Family Medicine

## 2018-05-28 VITALS — BP 133/84 | HR 84 | Temp 97.9°F | Ht 68.0 in | Wt 229.0 lb

## 2018-05-28 DIAGNOSIS — E669 Obesity, unspecified: Secondary | ICD-10-CM

## 2018-05-28 DIAGNOSIS — E1169 Type 2 diabetes mellitus with other specified complication: Secondary | ICD-10-CM

## 2018-05-28 DIAGNOSIS — Z9189 Other specified personal risk factors, not elsewhere classified: Secondary | ICD-10-CM

## 2018-05-28 DIAGNOSIS — Z6834 Body mass index (BMI) 34.0-34.9, adult: Secondary | ICD-10-CM

## 2018-05-28 DIAGNOSIS — E559 Vitamin D deficiency, unspecified: Secondary | ICD-10-CM | POA: Diagnosis not present

## 2018-05-28 DIAGNOSIS — E785 Hyperlipidemia, unspecified: Secondary | ICD-10-CM

## 2018-05-28 MED ORDER — VITAMIN D (ERGOCALCIFEROL) 1.25 MG (50000 UNIT) PO CAPS: 50000.0000 [IU] | ORAL_CAPSULE | ORAL | 0 refills | Status: DC

## 2018-05-30 NOTE — Progress Notes (Signed)
Office: [phone]  /  Fax: [phone]   HPI:   Chief Complaint: OBESITY Donna Vasquez is here to discuss her progress with her obesity treatment plan. She is on the Category 2 plan and is following her eating plan approximately 75 % of the time. She states she is exercising 0 minutes 0 times per week. Donna Vasquez reports low key holidays and did nothing for New Year's Eve. She was able to stick to the plan for the most part. She is struggling with contemplating divorce and separation.  Her weight is 229 lb (103.9 kg) today and has had a weight loss of 1 pound over a period of 3 weeks since her last visit. She has lost 19 lbs since starting treatment with [name].  Vitamin D Deficiency Donna Vasquez has a diagnosis of vitamin D deficiency. She is currently taking prescription Vit D. She notes fatigue and denies nausea, vomiting or muscle weakness.  At risk for osteopenia and osteoporosis Donna Vasquez is at higher risk of osteopenia and osteoporosis due to vitamin D deficiency.   Hyperlipidemia Donna Vasquez has hyperlipidemia and has been trying to improve her cholesterol levels with intensive lifestyle modification including a low saturated fat diet, exercise and weight loss. She she is on statin and denies any chest pain, claudication or myalgias.  ASSESSMENT AND PLAN:  Vitamin D deficiency - Plan: Vitamin D, Ergocalciferol, (DRISDOL) 1.25 MG (50000 UT) CAPS capsule  Hyperlipidemia associated with type 2 diabetes mellitus (HCC)  At risk for osteoporosis  Class 1 obesity with serious comorbidity and body mass index (BMI) of 34.0 to 34.9 in adult, unspecified obesity type  PLAN:  Vitamin D Deficiency Donna Vasquez was informed that low vitamin D levels contributes to fatigue and are associated with obesity, breast, and colon cancer. Donna Vasquez agrees to continue taking prescription Vit D ,000 IU every week #4 and we will refill for 1 month. She will follow up for routine testing of vitamin D, at least 2-3 times  per year. She was informed of the risk of over-replacement of vitamin D and agrees to not increase her dose unless she discusses this with [name] first. Donna Vasquez agrees to follow up with our clinic in 2 and 1/2 weeks.  At risk for osteopenia and osteoporosis Donna Vasquez was given extended (15 minutes) osteoporosis prevention counseling today. Lajune is at risk for osteopenia and osteoporsis due to her vitamin D deficiency. She was encouraged to take her vitamin D and follow her higher calcium diet and increase strengthening exercise to help strengthen her bones and decrease her risk of osteopenia and osteoporosis.  Hyperlipidemia Donna Vasquez was informed of the American Heart Association Guidelines emphasizing intensive lifestyle modifications as the first line treatment for hyperlipidemia. We discussed many lifestyle modifications today in depth, and Donna Vasquez will continue to work on decreasing saturated fats such as fatty red meat, butter and many fried foods. Fred agrees to continue her current medication, and she will also increase vegetables and lean protein in her diet and continue to work on exercise and weight loss efforts. Donna Vasquez agrees to follow up with our clinic in 2 and 1/2 weeks.  Obesity Donna Vasquez is currently in the action stage of change. As such, her goal is to continue with weight loss efforts She has agreed to follow the Category 2 plan Donna Vasquez has been instructed to work up to a goal of 150 minutes of combined cardio and strengthening exercise per week or Youtube exercise videos 2-3 times per week for weight loss and overall health benefits. We discussed the  following Behavioral Modification Strategies today: increasing lean protein intake, increasing vegetables, work on meal planning and easy cooking plans, and planning for success   Donna Vasquez has agreed to follow up with our clinic in 2 and 1/2 weeks. She was informed of the importance of frequent follow up visits to maximize her success  with intensive lifestyle modifications for her multiple health conditions.  ALLERGIES: Allergies  Allergen Reactions  . Flonase [Fluticasone Propionate] Other (See Comments)    Nasal irritation, burning, but can tolerate advair inhaler.    . Sulfa Antibiotics Hives    MEDICATIONS: Current Outpatient Medications on File Prior to Visit  Medication Sig Dispense Refill  . albuterol (PROAIR HFA) 108 (90 Base) MCG/ACT inhaler Inhale 1-2 puffs into the lungs every 6 (six) hours as needed for wheezing or shortness of breath. 1 Inhaler 2  . aspirin 81 MG tablet Take 81 mg by mouth daily.    [name] aspirin-acetaminophen-caffeine (EXCEDRIN MIGRAINE) 250-250-65 MG tablet Take by mouth every 6 (six) hours as needed for headache.    [name] buPROPion (WELLBUTRIN XL) 300 MG 24 hr tablet Take 1 tablet (300 mg total) by mouth daily. 90 tablet 3  . dextromethorphan-guaiFENesin (MUCINEX DM) 30-600 MG 12hr tablet Take 1 tablet by mouth 2 (two) times daily as needed for cough.    . Fluticasone-Salmeterol (ADVAIR) 100-50 MCG/DOSE AEPB Inhale 1 puff into the lungs 2 (two) times daily.    . montelukast (SINGULAIR) 10 MG tablet TAKE 1 TABLET (10 MG TOTAL) BY MOUTH AT BEDTIME. 30 tablet 2  . niacin (NIASPAN) 1000 MG CR tablet TAKE 1 TABLET BY MOUTH AT  BEDTIME 90 tablet 1  . omeprazole (PRILOSEC) 20 MG capsule Take 1 capsule (20 mg total) by mouth daily. 30 capsule 2  . sertraline (ZOLOFT) 100 MG tablet TAKE 1 TABLET BY MOUTH  DAILY 90 tablet 1  . simvastatin (ZOCOR) 20 MG tablet TAKE 1 TABLET BY MOUTH  DAILY AT 6 PM. 90 tablet 1  . traZODone (DESYREL) 50 MG tablet Take 0.5-1 tablets (25-50 mg total) by mouth at bedtime as needed for sleep. 30 tablet 3  . triamcinolone cream (KENALOG) 0.1 % Apply 1 application topically 2 (two) times daily as needed. 45 g 1   No current facility-administered medications on file prior to visit.     PAST MEDICAL HISTORY: Past Medical History:  Diagnosis Date  . Allergy   . Asthma   .  Back pain   . Bulging lumbar disc   . Chicken pox   . Constipation   . Depression   . Fatigue   . Frequent headaches   . Gall bladder disease   . Headache   . Heartburn   . Hyperlipidemia   . IBS (irritable bowel syndrome)   . Interstitial cystitis   . RA (rheumatoid arthritis) (HCC)   . Shortness of breath on exertion   . Trouble in sleeping   . Urine incontinence   . Vertigo   . Vision changes   . Vitamin D deficiency     PAST SURGICAL HISTORY: Past Surgical History:  Procedure Laterality Date  . CHOLECYSTECTOMY    . cyst removed from back    . NASAL SINUS SURGERY    . TUBAL LIGATION  1999    SOCIAL HISTORY: Social History   Tobacco Use  . Smoking status: Never Smoker  . Smokeless tobacco: Never Used  Substance Use Topics  . Alcohol use: Yes    Alcohol/week: 0.0 standard drinks  Comment: rare--wine  . Drug use: No    FAMILY HISTORY: Family History  Problem Relation Age of Onset  . COPD Mother   . Heart disease Mother   . Polymyalgia rheumatica Mother   . Emphysema Mother   . Stroke Mother   . Depression Mother   . Anxiety disorder Mother   . Heart disease Father   . Hypertension Father   . Hyperlipidemia Father   . Anxiety disorder Father   . Cancer Paternal Aunt        Breast  . Breast cancer Paternal Aunt 26  . Stroke Maternal Grandmother   . Cancer Maternal Aunt     ROS: Review of Systems  Constitutional: Positive for malaise/fatigue and weight loss.  Cardiovascular: Negative for chest pain and claudication.  Gastrointestinal: Negative for nausea and vomiting.  Musculoskeletal: Negative for myalgias.       Negative muscle weakness    PHYSICAL EXAM: Blood pressure 133/84, pulse 84, temperature 97.9 F (36.6 C), temperature source Oral, height 5\' 8"  (1.727 m), weight 229 lb (103.9 kg), last menstrual period 04/28/2018, SpO2 97 %. Body mass index is 34.82 kg/m. Physical Exam Vitals signs reviewed.  Constitutional:      Appearance:  Normal appearance. She is obese.  Cardiovascular:     Rate and Rhythm: Normal rate.     Pulses: Normal pulses.  Pulmonary:     Effort: Pulmonary effort is normal.     Breath sounds: Normal breath sounds.  Musculoskeletal: Normal range of motion.  Skin:    General: Skin is warm and dry.  Neurological:     Mental Status: She is alert and oriented to person, place, and time.  Psychiatric:        Mood and Affect: Mood normal.        Behavior: Behavior normal.     RECENT LABS AND TESTS: BMET    Component Value Date/Time   NA 140 04/02/2018 0844   K 3.9 04/02/2018 0844   CL 99 04/02/2018 0844   CO2 23 04/02/2018 0844   GLUCOSE 90 04/02/2018 0844   GLUCOSE 93 11/13/2015 1501   BUN 18 04/02/2018 0844   CREATININE 0.99 04/02/2018 0844   CALCIUM 9.3 04/02/2018 0844   GFRNONAA 65 04/02/2018 0844   GFRAA 75 04/02/2018 0844   Lab Results  Component Value Date   HGBA1C 5.0 04/02/2018   HGBA1C 5.1 11/17/2017   HGBA1C 5.0 11/13/2015   Lab Results  Component Value Date   INSULIN 10.1 04/02/2018   INSULIN 8.7 11/17/2017   CBC    Component Value Date/Time   WBC 6.0 10/16/2017 1000   WBC 8.6 11/13/2015 1501   RBC 4.39 10/16/2017 1000   RBC 4.49 11/13/2015 1501   HGB 13.4 10/16/2017 1000   HCT 40.2 10/16/2017 1000   PLT 229 10/16/2017 1000   MCV 92 10/16/2017 1000   MCH 30.5 10/16/2017 1000   MCHC 33.3 10/16/2017 1000   MCHC 34.0 11/13/2015 1501   RDW 13.3 10/16/2017 1000   LYMPHSABS 1.4 10/16/2017 1000   EOSABS 0.2 10/16/2017 1000   BASOSABS 0.0 10/16/2017 1000   Iron/TIBC/Ferritin/ %Sat No results found for: IRON, TIBC, FERRITIN, IRONPCTSAT Lipid Panel     Component Value Date/Time   CHOL 134 04/02/2018 0844   TRIG 87 04/02/2018 0844   HDL 52 04/02/2018 0844   CHOLHDL 2.6 04/02/2018 0844   CHOLHDL 3 11/13/2015 1501   VLDL 29.8 11/13/2015 1501   LDLCALC 65 04/02/2018 0844   Hepatic  Function Panel     Component Value Date/Time   PROT 6.4 04/02/2018 0844    ALBUMIN 4.4 04/02/2018 0844   AST 23 04/02/2018 0844   ALT 22 04/02/2018 0844   ALKPHOS 98 04/02/2018 0844   BILITOT 0.4 04/02/2018 0844      Component Value Date/Time   TSH 1.990 11/17/2017 1158   TSH 2.280 10/16/2017 1000   TSH 2.12 11/13/2015 1501      OBESITY BEHAVIORAL INTERVENTION VISIT  Today's visit was # 13   Starting weight: 248 lbs Starting date: 11/17/17 Today's weight : 229 lbs Today's date: 05/28/2018 Total lbs lost to date: 1119    ASK: We discussed the diagnosis of obesity with Donna Vasquez today and Donna Vasquez agreed to give us permission to discuss obesity behavioral modification therapy today.  ASSESS: Donna Vasquez has the diagnosis of obesity and her BMI today is 34.83 Donna Vasquez is in the action stage of change   ADVISE: Donna Vasquez was educated on the multiple health risks of obesity as well as the benefit of weight loss to improve her health. She was advised of the need for long term treatment and the importance of lifestyle modifications to improve her current health and to decrease her risk of future health problems.  AGREE: Multiple dietary modification options and treatment options were discussed and  Donna Vasquez agreed to follow the recommendations documented in the above note.  ARRANGE: Donna Vasquez was educated on the importance of frequent visits to treat obesity as outlined per CMS and USPSTF guidelines and agreed to schedule her next follow up appointment today.  I, Burt KnackSharon Martin, am acting as transcriptionist for Debbra RidingAlexandria Kadolph, MD  I have reviewed the above documentation for accuracy and completeness, and I agree with the above. - Debbra RidingAlexandria Kadolph, MD

## 2018-06-17 ENCOUNTER — Ambulatory Visit (INDEPENDENT_AMBULATORY_CARE_PROVIDER_SITE_OTHER): Payer: Self-pay | Admitting: Family Medicine

## 2018-06-17 ENCOUNTER — Encounter (INDEPENDENT_AMBULATORY_CARE_PROVIDER_SITE_OTHER): Payer: Self-pay

## 2018-06-22 ENCOUNTER — Ambulatory Visit (INDEPENDENT_AMBULATORY_CARE_PROVIDER_SITE_OTHER): Payer: Self-pay | Admitting: Family Medicine

## 2018-06-22 ENCOUNTER — Encounter (INDEPENDENT_AMBULATORY_CARE_PROVIDER_SITE_OTHER): Payer: Self-pay

## 2018-06-30 ENCOUNTER — Encounter (INDEPENDENT_AMBULATORY_CARE_PROVIDER_SITE_OTHER): Payer: Self-pay

## 2018-06-30 ENCOUNTER — Ambulatory Visit (INDEPENDENT_AMBULATORY_CARE_PROVIDER_SITE_OTHER): Payer: Self-pay | Admitting: Family Medicine

## 2018-07-16 ENCOUNTER — Telehealth: Payer: Self-pay

## 2018-07-16 NOTE — Telephone Encounter (Signed)
LM for patient to schedule compliance visit for CPAP.

## 2018-07-27 ENCOUNTER — Ambulatory Visit: Payer: Managed Care, Other (non HMO) | Admitting: Internal Medicine

## 2018-07-27 ENCOUNTER — Encounter: Payer: Self-pay | Admitting: Internal Medicine

## 2018-07-27 VITALS — BP 148/88 | HR 71 | Ht 67.5 in | Wt 233.8 lb

## 2018-07-27 DIAGNOSIS — G4733 Obstructive sleep apnea (adult) (pediatric): Secondary | ICD-10-CM | POA: Diagnosis not present

## 2018-07-27 NOTE — Patient Instructions (Signed)
CONTINUE CPAP THERAPY AS PRESCRIBED-EXCELLENT REPORT!!!! GREAT JOB!!!         Sleep Apnea Sleep apnea affects breathing during sleep. It causes breathing to stop for a short time or to become shallow. It can also increase the risk of:  Heart attack.  Stroke.  Being very overweight (obese).  Diabetes.  Heart failure.  Irregular heartbeat. The goal of treatment is to help you breathe normally again. What are the causes? There are three kinds of sleep apnea:  Obstructive sleep apnea. This is caused by a blocked or collapsed airway.  Central sleep apnea. This happens when the brain does not send the right signals to the muscles that control breathing.  Mixed sleep apnea. This is a combination of obstructive and central sleep apnea. The most common cause of this condition is a collapsed or blocked airway. This can happen if:  Your throat muscles are too relaxed.  Your tongue and tonsils are too large.  You are overweight.  Your airway is too small. What increases the risk?  Being overweight.  Smoking.  Having a small airway.  Being older.  Being female.  Drinking alcohol.  Taking medicines to calm yourself (sedatives or tranquilizers).  Having family members with the condition. What are the signs or symptoms?  Trouble staying asleep.  Being sleepy or tired during the day.  Getting angry a lot.  Loud snoring.  Headaches in the morning.  Not being able to focus your mind (concentrate).  Forgetting things.  Less interest in sex.  Mood swings.  Personality changes.  Feelings of sadness (depression).  Waking up a lot during the night to pee (urinate).  Dry mouth.  Sore throat. How is this diagnosed?  Your medical history.  A physical exam.  A test that is done when you are sleeping (sleep study). The test is most often done in a sleep lab but may also be done at home. How is this treated?   Sleeping on your side.  Using a  medicine to get rid of mucus in your nose (decongestant).  Avoiding the use of alcohol, medicines to help you relax, or certain pain medicines (narcotics).  Losing weight, if needed.  Changing your diet.  Not smoking.  Using a machine to open your airway while you sleep, such as: ? An oral appliance. This is a mouthpiece that shifts your lower jaw forward. ? A CPAP device. This device blows air through a mask when you breathe out (exhale). ? An EPAP device. This has valves that you put in each nostril. ? A BPAP device. This device blows air through a mask when you breathe in (inhale) and breathe out.  Having surgery if other treatments do not work. It is important to get treatment for sleep apnea. Without treatment, it can lead to:  High blood pressure.  Coronary artery disease.  In men, not being able to have an erection (impotence).  Reduced thinking ability. Follow these instructions at home: Lifestyle  Make changes that your doctor recommends.  Eat a healthy diet.  Lose weight if needed.  Avoid alcohol, medicines to help you relax, and some pain medicines.  Do not use any products that contain nicotine or tobacco, such as cigarettes, e-cigarettes, and chewing tobacco. If you need help quitting, ask your doctor. General instructions  Take over-the-counter and prescription medicines only as told by your doctor.  If you were given a machine to use while you sleep, use it only as told by your doctor.  If you are having surgery, make sure to tell your doctor you have sleep apnea. You may need to bring your device with you.  Keep all follow-up visits as told by your doctor. This is important. Contact a doctor if:  The machine that you were given to use during sleep bothers you or does not seem to be working.  You do not get better.  You get worse. Get help right away if:  Your chest hurts.  You have trouble breathing in enough air.  You have an uncomfortable  feeling in your back, arms, or stomach.  You have trouble talking.  One side of your body feels weak.  A part of your face is hanging down. These symptoms may be an emergency. Do not wait to see if the symptoms will go away. Get medical help right away. Call your local emergency services (911 in the [place] Do not drive yourself to the hospital. Summary  This condition affects breathing during sleep.  The most common cause is a collapsed or blocked airway.  The goal of treatment is to help you breathe normally while you sleep. This information is not intended to replace advice given to you by your health care provider. Make sure you discuss any questions you have with your health care provider. Document Released: 02/13/2008 Document Revised: 12/30/2017 Document Reviewed: 12/30/2017 Elsevier Interactive Patient Education  [employer].

## 2018-07-27 NOTE — Progress Notes (Signed)
   Name: Donna Vasquez MRN: [id number] DOB: 02/06/1966     CONSULTATION DATE:  REFERRING MD :   CHIEF COMPLAINT: follow up OSA  STUDIES:     CXR independently reviewed by Me    CT chest Independently reviewed by Me  HISTORY OF PRESENT ILLNESS:  Dx with Severe OSA AHI 29 She was started on autoCPAP 4-20 cm h20 Doing well-Uses and benefits from therapy Great compliance report AHI down to 1.1  persistent air leaks-will lower pressures  No signs of infection No signs of CHF  Sleep study and compliance report reviewed with patient  Patient weighs 232 pounds   Review of Systems:  Gen:  Denies  fever, sweats, chills weigh loss  HEENT: Denies blurred vision, double vision, ear pain, eye pain, hearing loss, nose bleeds, sore throat Cardiac:  No dizziness, chest pain or heaviness, chest tightness,edema, No JVD Resp:   No cough, -sputum production, -shortness of breath,-wheezing, -hemoptysis,  Gi: Denies swallowing difficulty, stomach pain, nausea or vomiting, diarrhea, constipation, bowel incontinence Gu:  Denies bladder incontinence, burning urine Ext:   Denies Joint pain, stiffness or swelling Skin: Denies  skin rash, easy bruising or bleeding or hives Endoc:  Denies polyuria, polydipsia , polyphagia or weight change Psych:   Denies depression, insomnia or hallucinations  Other:  All other systems negative  BP (!) 148/88   Pulse 71   Ht 5' 7.5" (1.715 m)   Wt 233 lb 12.8 oz (106.1 kg)   SpO2 97%   BMI 36.08 kg/m   Physical Examination:   GENERAL:NAD, no fevers, chills, no weakness no fatigue HEAD: Normocephalic, atraumatic.  EYES: PERLA, EOMI No scleral icterus.  MOUTH: Moist mucosal membrane.  EAR, NOSE, THROAT: Clear without exudates. No external lesions.  NECK: Supple. No thyromegaly.  No JVD.  PULMONARY: CTA B/L no wheezing, rhonchi, crackles CARDIOVASCULAR: S1 and S2. Regular rate and rhythm. No murmurs GASTROINTESTINAL: Soft, nontender, nondistended.  Positive bowel sounds.  MUSCULOSKELETAL: No swelling, clubbing, or edema.  NEUROLOGIC: No gross focal neurological deficits. 5/5 strength all extremities SKIN: No ulceration, lesions, rashes, or cyanosis.  PSYCHIATRIC: Insight, judgment intact. -depression -anxiety ALL OTHER ROS ARE NEGATIVE         ASSESSMENT / PLAN: 53 yo obese WF with signs and symptoms of excessive daytime sleepiness new Dx of Obstructive Sleep apnea AHi 29  Patient with some air leak  Will change to AUTO CPAP 4-15 and assess at next OV She has excellent compliance report AHI decreased to 1.1   Obesity -recommend significant weight loss -recommend changing diet  Deconditioned state -Recommend increased daily activity and exercise   Patient  satisfied with Plan of action and management. All questions answered Follow up 6 months  Akanksha Bellmore Santiago Glad, M.D.  [facility]  Medical Director [facility] Medical Director [facility] Cardio-Pulmonary Department

## 2018-07-29 ENCOUNTER — Encounter (INDEPENDENT_AMBULATORY_CARE_PROVIDER_SITE_OTHER): Payer: Self-pay | Admitting: Family Medicine

## 2018-07-29 ENCOUNTER — Other Ambulatory Visit: Payer: Self-pay

## 2018-07-29 ENCOUNTER — Ambulatory Visit (INDEPENDENT_AMBULATORY_CARE_PROVIDER_SITE_OTHER): Payer: Managed Care, Other (non HMO) | Admitting: Family Medicine

## 2018-07-29 VITALS — BP 124/81 | HR 84 | Temp 98.3°F | Ht 68.0 in | Wt 230.0 lb

## 2018-07-29 DIAGNOSIS — F3289 Other specified depressive episodes: Secondary | ICD-10-CM

## 2018-07-29 DIAGNOSIS — Z6835 Body mass index (BMI) 35.0-35.9, adult: Secondary | ICD-10-CM

## 2018-07-29 DIAGNOSIS — E559 Vitamin D deficiency, unspecified: Secondary | ICD-10-CM | POA: Diagnosis not present

## 2018-07-29 NOTE — Progress Notes (Signed)
Office: [phone]  /  Fax: [phone]   HPI:   Chief Complaint: OBESITY Donna Vasquez is here to discuss her progress with her obesity treatment plan. She is on the Category 2 plan and is following her eating plan approximately 50 % of the time. She states she is walking for 20 minutes 1 time per week. Donna Vasquez returns to the clinic after 2 months. She states work has been very up and down. She was almost put on a personalized plan at work to hit her number goals. She got a CPAP machine recently. She is still having stress from marital issues.  Her weight is 230 lb (104.3 kg) today and has gained 1 pound since her last visit. She has lost 18 lbs since starting treatment with [name].  Vitamin D Deficiency Donna Vasquez has a diagnosis of vitamin D deficiency. She is currently taking prescription Vit D. She notes fatigue and denies nausea, vomiting or muscle weakness.  Depression with emotional eating behaviors Donna Vasquez's mood is somewhat apathetic. She has phone numbers for counselors but she hasn't called yet. Donna Vasquez struggles with emotional eating and using food for comfort to the extent that it is negatively impacting her health. She often snacks when she is not hungry. Donna Vasquez sometimes feels she is out of control and then feels guilty that she made poor food choices. She has been working on behavior modification techniques to help reduce her emotional eating and has been somewhat successful. She shows no sign of suicidal or homicidal ideations.  Depression screen [facility] 2/9 05/25/2018 11/17/2017 10/16/2017 09/15/2017 02/20/2017  Decreased Interest 1 3 1 3  -  Down, Depressed, Hopeless 1 3 1 3  0  PHQ - 2 Score 2 6 2 6  0  Altered sleeping 0 2 2 3  -  Tired, decreased energy 1 3 2 3  -  Change in appetite 0 3 2 3  -  Feeling bad or failure about yourself  1 0 2 3 -  Trouble concentrating 1 3 1 3  -  Moving slowly or fidgety/restless 0 0 0 0 -  Suicidal thoughts 0 0 0 0 -  PHQ-9 Score 5 17 11 21  -  Difficult  doing work/chores Somewhat difficult Somewhat difficult Very difficult Very difficult -    ASSESSMENT AND PLAN:  Vitamin D deficiency  Other depression  Class 2 severe obesity with serious comorbidity and body mass index (BMI) of 35.0 to 35.9 in adult, unspecified obesity type (HCC)  PLAN:  Vitamin D Deficiency [name] was informed that low vitamin D levels contributes to fatigue and are associated with obesity, breast, and colon cancer. [name] agrees to continue taking prescription Vit D @50 ,000 IU every week and will follow up for routine testing of vitamin D, at least 2-3 times per year. She was informed of the risk of over-replacement of vitamin D and agrees to not increase her dose unless she discusses this with [name] first. [name] agrees to follow up with our clinic in 2 weeks.  Depression with Emotional Eating Behaviors We discussed behavior modification techniques today to help Donna Vasquez deal with her emotional eating and depression. Donna Vasquez agrees to continue her current medications (on Wellbutrin SR and Zoloft). Donna Vasquez agrees to follow up with our clinic in 2 weeks.  I spent > than 50% of the 15 minute visit on counseling as documented in the note.  Obesity Donna Vasquez is currently in the action stage of change. As such, her goal is to continue with weight loss efforts She has agreed to follow a lower  carbohydrate, vegetable and lean protein rich diet plan Donna Vasquez has been instructed to work up to a goal of 150 minutes of combined cardio and strengthening exercise per week or increase walking to 20 minutes 3 times per week for weight loss and overall health benefits. We discussed the following Behavioral Modification Strategies today: increasing lean protein intake, increasing vegetables and work on meal planning and easy cooking plans, better snacking choices, and planning for success   Donna Vasquez has agreed to follow up with our clinic in 2 weeks. She was informed of the importance of  frequent follow up visits to maximize her success with intensive lifestyle modifications for her multiple health conditions.  ALLERGIES: Allergies  Allergen Reactions  . Flonase [Fluticasone Propionate] Other (See Comments)    Nasal irritation, burning, but can tolerate advair inhaler.    . Sulfa Antibiotics Hives    MEDICATIONS: Current Outpatient Medications on File Prior to Visit  Medication Sig Dispense Refill  . albuterol (PROAIR HFA) 108 (90 Base) MCG/ACT inhaler Inhale 1-2 puffs into the lungs every 6 (six) hours as needed for wheezing or shortness of breath. 1 Inhaler 2  . aspirin 81 MG tablet Take 81 mg by mouth daily.    [name]. aspirin-acetaminophen-caffeine (EXCEDRIN MIGRAINE) 250-250-65 MG tablet Take by mouth every 6 (six) hours as needed for headache.    [name]. buPROPion (WELLBUTRIN XL) 300 MG 24 hr tablet Take 1 tablet (300 mg total) by mouth daily. 90 tablet 3  . dextromethorphan-guaiFENesin (MUCINEX DM) 30-600 MG 12hr tablet Take 1 tablet by mouth 2 (two) times daily as needed for cough.    . Fluticasone-Salmeterol (ADVAIR) 100-50 MCG/DOSE AEPB Inhale 1 puff into the lungs 2 (two) times daily.    . montelukast (SINGULAIR) 10 MG tablet TAKE 1 TABLET (10 MG TOTAL) BY MOUTH AT BEDTIME. 30 tablet 2  . niacin (NIASPAN) 1000 MG CR tablet TAKE 1 TABLET BY MOUTH AT  BEDTIME 90 tablet 1  . omeprazole (PRILOSEC) 20 MG capsule Take 1 capsule (20 mg total) by mouth daily. 30 capsule 2  . sertraline (ZOLOFT) 100 MG tablet TAKE 1 TABLET BY MOUTH  DAILY 90 tablet 1  . simvastatin (ZOCOR) 20 MG tablet TAKE 1 TABLET BY MOUTH  DAILY AT 6 PM. 90 tablet 1  . traZODone (DESYREL) 50 MG tablet Take 0.5-1 tablets (25-50 mg total) by mouth at bedtime as needed for sleep. 30 tablet 3  . triamcinolone cream (KENALOG) 0.1 % Apply 1 application topically 2 (two) times daily as needed. 45 g 1  . Vitamin D, Ergocalciferol, (DRISDOL) 1.25 MG (50000 UT) CAPS capsule Take 1 capsule (50,000 Units total) by mouth every  7 (seven) days. 4 capsule 0   No current facility-administered medications on file prior to visit.     PAST MEDICAL HISTORY: Past Medical History:  Diagnosis Date  . Allergy   . Asthma   . Back pain   . Bulging lumbar disc   . Chicken pox   . Constipation   . Depression   . Fatigue   . Frequent headaches   . Gall bladder disease   . Headache   . Heartburn   . Hyperlipidemia   . IBS (irritable bowel syndrome)   . Interstitial cystitis   . RA (rheumatoid arthritis) (HCC)   . Shortness of breath on exertion   . Trouble in sleeping   . Urine incontinence   . Vertigo   . Vision changes   . Vitamin D deficiency  PAST SURGICAL HISTORY: Past Surgical History:  Procedure Laterality Date  . CHOLECYSTECTOMY    . cyst removed from back    . NASAL SINUS SURGERY    . TUBAL LIGATION  1999    SOCIAL HISTORY: Social History   Tobacco Use  . Smoking status: Never Smoker  . Smokeless tobacco: Never Used  Substance Use Topics  . Alcohol use: Yes    Alcohol/week: 0.0 standard drinks    Comment: rare--wine  . Drug use: No    FAMILY HISTORY: Family History  Problem Relation Age of Onset  . COPD Mother   . Heart disease Mother   . Polymyalgia rheumatica Mother   . Emphysema Mother   . Stroke Mother   . Depression Mother   . Anxiety disorder Mother   . Heart disease Father   . Hypertension Father   . Hyperlipidemia Father   . Anxiety disorder Father   . Cancer Paternal Aunt        Breast  . Breast cancer Paternal Aunt [id number]  . Stroke Maternal Grandmother   . Cancer Maternal Aunt     ROS: Review of Systems  Constitutional: Positive for malaise/fatigue. Negative for weight loss.  Gastrointestinal: Negative for nausea and vomiting.  Musculoskeletal:       Negative muscle weakness  Psychiatric/Behavioral: Positive for depression. Negative for suicidal ideas.    PHYSICAL EXAM: Blood pressure 124/81, pulse 84, temperature 98.3 F (36.8 C), temperature source  Oral, height 5\' 8"  (1.727 m), weight 230 lb (104.3 kg), SpO2 96 %. Body mass index is 34.97 kg/m. Physical Exam Vitals signs reviewed.  Constitutional:      Appearance: Normal appearance. She is obese.  Cardiovascular:     Rate and Rhythm: Normal rate.     Pulses: Normal pulses.  Pulmonary:     Effort: Pulmonary effort is normal.     Breath sounds: Normal breath sounds.  Musculoskeletal: Normal range of motion.  Skin:    General: Skin is warm and dry.  Neurological:     Mental Status: She is alert and oriented to person, place, and time.  Psychiatric:        Mood and Affect: Mood normal.        Behavior: Behavior normal.     RECENT LABS AND TESTS: BMET    Component Value Date/Time   NA 140 04/02/2018 0844   K 3.9 04/02/2018 0844   CL 99 04/02/2018 0844   CO2 23 04/02/2018 0844   GLUCOSE 90 04/02/2018 0844   GLUCOSE 93 11/13/2015 1501   BUN 18 04/02/2018 0844   CREATININE 0.99 04/02/2018 0844   CALCIUM 9.3 04/02/2018 0844   GFRNONAA 65 04/02/2018 0844   GFRAA 75 04/02/2018 0844   Lab Results  Component Value Date   HGBA1C 5.0 04/02/2018   HGBA1C 5.1 11/17/2017   HGBA1C 5.0 11/13/2015   Lab Results  Component Value Date   INSULIN 10.1 04/02/2018   INSULIN 8.7 11/17/2017   CBC    Component Value Date/Time   WBC 6.0 10/16/2017 1000   WBC 8.6 11/13/2015 1501   RBC 4.39 10/16/2017 1000   RBC 4.49 11/13/2015 1501   HGB 13.4 10/16/2017 1000   HCT 40.2 10/16/2017 1000   PLT 229 10/16/2017 1000   MCV 92 10/16/2017 1000   MCH 30.5 10/16/2017 1000   MCHC 33.3 10/16/2017 1000   MCHC 34.0 11/13/2015 1501   RDW 13.3 10/16/2017 1000   LYMPHSABS 1.4 10/16/2017 1000   EOSABS 0.2 10/16/2017  1000   BASOSABS 0.0 10/16/2017 1000   Iron/TIBC/Ferritin/ %Sat No results found for: IRON, TIBC, FERRITIN, IRONPCTSAT Lipid Panel     Component Value Date/Time   CHOL 134 04/02/2018 0844   TRIG 87 04/02/2018 0844   HDL 52 04/02/2018 0844   CHOLHDL 2.6 04/02/2018 0844    CHOLHDL 3 11/13/2015 1501   VLDL 29.8 11/13/2015 1501   LDLCALC 65 04/02/2018 0844   Hepatic Function Panel     Component Value Date/Time   PROT 6.4 04/02/2018 0844   ALBUMIN 4.4 04/02/2018 0844   AST 23 04/02/2018 0844   ALT 22 04/02/2018 0844   ALKPHOS 98 04/02/2018 0844   BILITOT 0.4 04/02/2018 0844      Component Value Date/Time   TSH 1.990 11/17/2017 1158   TSH 2.280 10/16/2017 1000   TSH 2.12 11/13/2015 1501      OBESITY BEHAVIORAL INTERVENTION VISIT  Today's visit was # 14   Starting weight: 248 lbs Starting date: 11/17/17 Today's weight : 230 lbs  Today's date: 07/29/2018 Total lbs lost to date: 18    07/29/2018  Height  (1.727 m)  Weight 230 lb (104.3 kg)  BMI (Calculated) 34.98  BLOOD PRESSURE - SYSTOLIC 124  BLOOD PRESSURE - DIASTOLIC 81   Body Fat % 42.8 %  Total Body Water (lbs) 87.6 lbs     ASK: We discussed the diagnosis of obesity with Jeananne Rama today and Diany agreed to give [name] permission to discuss obesity behavioral modification therapy today.  ASSESS: Caress has the diagnosis of obesity and her BMI today is 34.98 Reata is in the action stage of change   ADVISE: Kayliana was educated on the multiple health risks of obesity as well as the benefit of weight loss to improve her health. She was advised of the need for long term treatment and the importance of lifestyle modifications to improve her current health and to decrease her risk of future health problems.  AGREE: Multiple dietary modification options and treatment options were discussed and  Kamiya agreed to follow the recommendations documented in the above note.  ARRANGE: Cinnamon was educated on the importance of frequent visits to treat obesity as outlined per CMS and USPSTF guidelines and agreed to schedule her next follow up appointment today.  I, Burt Knack, am acting as transcriptionist for Debbra Riding, MD  I have reviewed the above documentation for  accuracy and completeness, and I agree with the above. - Debbra Riding, MD

## 2018-08-03 ENCOUNTER — Other Ambulatory Visit: Payer: Self-pay | Admitting: Family Medicine

## 2018-08-12 ENCOUNTER — Encounter (INDEPENDENT_AMBULATORY_CARE_PROVIDER_SITE_OTHER): Payer: Self-pay

## 2018-08-18 ENCOUNTER — Other Ambulatory Visit: Payer: Self-pay

## 2018-08-18 ENCOUNTER — Ambulatory Visit (INDEPENDENT_AMBULATORY_CARE_PROVIDER_SITE_OTHER): Payer: Managed Care, Other (non HMO) | Admitting: Family Medicine

## 2018-08-18 ENCOUNTER — Encounter (INDEPENDENT_AMBULATORY_CARE_PROVIDER_SITE_OTHER): Payer: Self-pay | Admitting: Family Medicine

## 2018-08-18 DIAGNOSIS — E669 Obesity, unspecified: Secondary | ICD-10-CM

## 2018-08-18 DIAGNOSIS — E559 Vitamin D deficiency, unspecified: Secondary | ICD-10-CM | POA: Diagnosis not present

## 2018-08-18 DIAGNOSIS — J3089 Other allergic rhinitis: Secondary | ICD-10-CM

## 2018-08-18 DIAGNOSIS — Z6834 Body mass index (BMI) 34.0-34.9, adult: Secondary | ICD-10-CM | POA: Diagnosis not present

## 2018-08-18 MED ORDER — MONTELUKAST SODIUM 10 MG PO TABS: ORAL_TABLET | ORAL | 0 refills | Status: DC

## 2018-08-18 MED ORDER — ALBUTEROL SULFATE HFA 108 (90 BASE) MCG/ACT IN AERS: 1.0000 | INHALATION_SPRAY | Freq: Four times a day (QID) | RESPIRATORY_TRACT | 0 refills | Status: DC | PRN

## 2018-08-19 ENCOUNTER — Other Ambulatory Visit: Payer: Self-pay

## 2018-08-19 MED ORDER — TRAZODONE HCL 50 MG PO TABS: 25.0000 mg | ORAL_TABLET | Freq: Every evening | ORAL | 3 refills | Status: DC | PRN

## 2018-08-19 NOTE — Progress Notes (Signed)
Office: [phone]  /  Fax: [phone] TeleHealth Visit:  Donna Vasquez has consented to this TeleHealth visit today via telephone call on Facetime. The patient is located at home, the provider is located at the [facility] office. The participants in this visit include the listed provider and patient and provider's assistant.  HPI:   Chief Complaint: OBESITY Donna Vasquez is here to discuss her progress with her obesity treatment plan. She is on the lower carbohydrate, vegetable and lean protein rich diet plan and is following her eating plan approximately 60-70 % of the time. She states she is walking 2 times per week. Donna Vasquez is experiencing fairly significant seasonal allergies, so she has been staying in. She voices she can't do outdoor activities as she likes.  We were unable to weigh the patient today for this TeleHealth visit. She feels as if she has gained 2 lbs since her last visit. She has lost 18 lbs since starting treatment with [name].  Allergic Rhinitis Donna Vasquez has seasonal allergies and notes symptoms. She states she is almost out of Singulair.   Vitamin D Deficiency Donna Vasquez has a diagnosis of vitamin D deficiency. She is currently taking prescription Vit D. She notes fatigue and denies nausea, vomiting or muscle weakness.  ASSESSMENT AND PLAN:  Seasonal allergic rhinitis due to other allergic trigger - Plan: montelukast (SINGULAIR) 10 MG tablet, albuterol (PROAIR HFA) 108 (90 Base) MCG/ACT inhaler  Vitamin D deficiency  Class 1 obesity with serious comorbidity and body mass index (BMI) of 34.0 to 34.9 in adult, unspecified obesity type  PLAN:  Allergic Rhinitis Taresa agrees to continue Albuterol 108 mcg/ACT inhaler, inhale 2-3 puffs every 6 hours as needed or when having shortness of breath or wheezing, and we will refill for 1 month. She agrees to continue taking Singulair 10 mg 1 tablet PO daily qhs #30 and we will refill for 1 month. Julien agrees to  follow up with our clinic in 2 weeks.   Vitamin D Deficiency Donna Vasquez was informed that low vitamin D levels contributes to fatigue and are associated with obesity, breast, and colon cancer. Donna Vasquez agrees to continue taking prescription Vit D @50 ,000 IU every week, no refill needed. She will follow up for routine testing of vitamin D, at least 2-3 times per year. She was informed of the risk of over-replacement of vitamin D and agrees to not increase her dose unless she discusses this with [name] first. Donna Vasquez agrees to follow up with our clinic in 2 weeks.  Obesity Donna Vasquez is currently in the action stage of change. As such, her goal is to continue with weight loss efforts She has agreed to follow the Category 2 plan  Donna Vasquez is to do breakfast and lunch most days of the week with mindfulness of portion sizes of starches at dinner. She is to start writing down snacks. Donna Vasquez has been instructed to work up to a goal of 150 minutes of combined cardio and strengthening exercise per week for weight loss and overall health benefits. We discussed the following Behavioral Modification Strategies today: increasing lean protein intake, increasing vegetables and work on meal planning and easy cooking plans, and planning for success   Rumor has agreed to follow up with our clinic in 2 weeks. She was informed of the importance of frequent follow up visits to maximize her success with intensive lifestyle modifications for her multiple health conditions.  ALLERGIES: Allergies  Allergen Reactions  . Flonase [Fluticasone Propionate] Other (See Comments)  Nasal irritation, burning, but can tolerate advair inhaler.    . Sulfa Antibiotics Hives    MEDICATIONS: Current Outpatient Medications on File Prior to Visit  Medication Sig Dispense Refill  . aspirin 81 MG tablet Take 81 mg by mouth daily.    [name]. aspirin-acetaminophen-caffeine (EXCEDRIN MIGRAINE) 250-250-65 MG tablet Take by mouth every 6 (six) hours  as needed for headache.    [name]. buPROPion (WELLBUTRIN XL) 300 MG 24 hr tablet Take 1 tablet (300 mg total) by mouth daily. 90 tablet 3  . dextromethorphan-guaiFENesin (MUCINEX DM) 30-600 MG 12hr tablet Take 1 tablet by mouth 2 (two) times daily as needed for cough.    . Fluticasone-Salmeterol (ADVAIR) 100-50 MCG/DOSE AEPB Inhale 1 puff into the lungs 2 (two) times daily.    . niacin (NIASPAN) 1000 MG CR tablet TAKE 1 TABLET BY MOUTH AT  BEDTIME 90 tablet 2  . omeprazole (PRILOSEC) 20 MG capsule Take 1 capsule (20 mg total) by mouth daily. 30 capsule 2  . sertraline (ZOLOFT) 100 MG tablet TAKE 1 TABLET BY MOUTH  DAILY 90 tablet 2  . simvastatin (ZOCOR) 20 MG tablet TAKE 1 TABLET BY MOUTH  DAILY AT 6 PM. 90 tablet 2  . triamcinolone cream (KENALOG) 0.1 % Apply 1 application topically 2 (two) times daily as needed. 45 g 1  . Vitamin D, Ergocalciferol, (DRISDOL) 1.25 MG (50000 UT) CAPS capsule Take 1 capsule (50,000 Units total) by mouth every 7 (seven) days. 4 capsule 0   No current facility-administered medications on file prior to visit.     PAST MEDICAL HISTORY: Past Medical History:  Diagnosis Date  . Allergy   . Asthma   . Back pain   . Bulging lumbar disc   . Chicken pox   . Constipation   . Depression   . Fatigue   . Frequent headaches   . Gall bladder disease   . Headache   . Heartburn   . Hyperlipidemia   . IBS (irritable bowel syndrome)   . Interstitial cystitis   . RA (rheumatoid arthritis) (HCC)   . Shortness of breath on exertion   . Trouble in sleeping   . Urine incontinence   . Vertigo   . Vision changes   . Vitamin D deficiency     PAST SURGICAL HISTORY: Past Surgical History:  Procedure Laterality Date  . CHOLECYSTECTOMY    . cyst removed from back    . NASAL SINUS SURGERY    . TUBAL LIGATION  1999    SOCIAL HISTORY: Social History   Tobacco Use  . Smoking status: Never Smoker  . Smokeless tobacco: Never Used  Substance Use Topics  . Alcohol use:  Yes    Alcohol/week: 0.0 standard drinks    Comment: rare--wine  . Drug use: No    FAMILY HISTORY: Family History  Problem Relation Age of Onset  . COPD Mother   . Heart disease Mother   . Polymyalgia rheumatica Mother   . Emphysema Mother   . Stroke Mother   . Depression Mother   . Anxiety disorder Mother   . Heart disease Father   . Hypertension Father   . Hyperlipidemia Father   . Anxiety disorder Father   . Cancer Paternal Aunt        Breast  . Breast cancer Paternal Aunt 750  . Stroke Maternal Grandmother   . Cancer Maternal Aunt     ROS: Review of Systems  Constitutional: Positive for malaise/fatigue. Negative for weight  loss.  Gastrointestinal: Negative for nausea and vomiting.  Musculoskeletal:       Negative muscle weakness    PHYSICAL EXAM: Pt in no acute distress  RECENT LABS AND TESTS: BMET    Component Value Date/Time   NA 140 04/02/2018 0844   K 3.9 04/02/2018 0844   CL 99 04/02/2018 0844   CO2 23 04/02/2018 0844   GLUCOSE 90 04/02/2018 0844   GLUCOSE 93 11/13/2015 1501   BUN 18 04/02/2018 0844   CREATININE 0.99 04/02/2018 0844   CALCIUM 9.3 04/02/2018 0844   GFRNONAA 65 04/02/2018 0844   GFRAA 75 04/02/2018 0844   Lab Results  Component Value Date   HGBA1C 5.0 04/02/2018   HGBA1C 5.1 11/17/2017   HGBA1C 5.0 11/13/2015   Lab Results  Component Value Date   INSULIN 10.1 04/02/2018   INSULIN 8.7 11/17/2017   CBC    Component Value Date/Time   WBC 6.0 10/16/2017 1000   WBC 8.6 11/13/2015 1501   RBC 4.39 10/16/2017 1000   RBC 4.49 11/13/2015 1501   HGB 13.4 10/16/2017 1000   HCT 40.2 10/16/2017 1000   PLT 229 10/16/2017 1000   MCV 92 10/16/2017 1000   MCH 30.5 10/16/2017 1000   MCHC 33.3 10/16/2017 1000   MCHC 34.0 11/13/2015 1501   RDW 13.3 10/16/2017 1000   LYMPHSABS 1.4 10/16/2017 1000   EOSABS 0.2 10/16/2017 1000   BASOSABS 0.0 10/16/2017 1000   Iron/TIBC/Ferritin/ %Sat No results found for: IRON, TIBC, FERRITIN,  IRONPCTSAT Lipid Panel     Component Value Date/Time   CHOL 134 04/02/2018 0844   TRIG 87 04/02/2018 0844   HDL 52 04/02/2018 0844   CHOLHDL 2.6 04/02/2018 0844   CHOLHDL 3 11/13/2015 1501   VLDL 29.8 11/13/2015 1501   LDLCALC 65 04/02/2018 0844   Hepatic Function Panel     Component Value Date/Time   PROT 6.4 04/02/2018 0844   ALBUMIN 4.4 04/02/2018 0844   AST 23 04/02/2018 0844   ALT 22 04/02/2018 0844   ALKPHOS 98 04/02/2018 0844   BILITOT 0.4 04/02/2018 0844      Component Value Date/Time   TSH 1.990 11/17/2017 1158   TSH 2.280 10/16/2017 1000   TSH 2.12 11/13/2015 1501      I, Burt Knack, am acting as transcriptionist for Debbra Riding, MD  I have reviewed the above documentation for accuracy and completeness, and I agree with the above. - Debbra Riding, MD

## 2018-08-22 ENCOUNTER — Other Ambulatory Visit: Payer: Self-pay | Admitting: Family Medicine

## 2018-08-22 DIAGNOSIS — E559 Vitamin D deficiency, unspecified: Secondary | ICD-10-CM

## 2018-08-25 NOTE — Telephone Encounter (Signed)
TA-Pt's Donna Vasquez is now up to 41/do you want to continue with this Rx strength or do I advise for 2K IU OTC/Plz advise/thx dmf

## 2018-08-31 ENCOUNTER — Encounter (INDEPENDENT_AMBULATORY_CARE_PROVIDER_SITE_OTHER): Payer: Self-pay | Admitting: Family Medicine

## 2018-08-31 ENCOUNTER — Other Ambulatory Visit: Payer: Self-pay

## 2018-08-31 ENCOUNTER — Ambulatory Visit (INDEPENDENT_AMBULATORY_CARE_PROVIDER_SITE_OTHER): Payer: Managed Care, Other (non HMO) | Admitting: Family Medicine

## 2018-08-31 DIAGNOSIS — Z6834 Body mass index (BMI) 34.0-34.9, adult: Secondary | ICD-10-CM

## 2018-08-31 DIAGNOSIS — E669 Obesity, unspecified: Secondary | ICD-10-CM | POA: Diagnosis not present

## 2018-08-31 DIAGNOSIS — E8881 Metabolic syndrome: Secondary | ICD-10-CM | POA: Diagnosis not present

## 2018-08-31 DIAGNOSIS — E559 Vitamin D deficiency, unspecified: Secondary | ICD-10-CM

## 2018-08-31 MED ORDER — VITAMIN D (ERGOCALCIFEROL) 1.25 MG (50000 UNIT) PO CAPS: 50000.0000 [IU] | ORAL_CAPSULE | ORAL | 0 refills | Status: AC

## 2018-08-31 NOTE — Progress Notes (Signed)
Office: [phone]  /  Fax: [phone] TeleHealth Visit:  Donna Vasquez has verbally consented to this TeleHealth visit today. The patient is located at home, the provider is located at the [facility] office. The participants in this visit include the listed provider and patient. The visit was conducted today via face time.  HPI:   Chief Complaint: OBESITY Donna Vasquez is here to discuss her progress with her obesity treatment plan. She is on the Category 2 plan and is following her eating plan approximately 70-80 % of the time. She states she is exercising 0 minutes 0 times per week. Donna Vasquez is still working from home and finds it difficult to be as productive at home. She had a conversation with her husband about snack foods at home.  We were unable to weigh the patient today for this TeleHealth visit. She feels as if she has maintained her weight since her last visit. She has lost 18 lbs since starting treatment with us.  Vitamin D Deficiency Donna Vasquez has a diagnosis of vitamin D deficiency. She is currently taking prescription Vit D. She notes fatigue and denies nausea, vomiting or muscle weakness.  Insulin Resistance Donna Vasquez has a diagnosis of insulin resistance based on her elevated fasting insulin level >5. Her last insulin level was of 10.1 (November 2019). Although [name]'s blood glucose readings are still under good control, insulin resistance puts her at greater risk of metabolic syndrome and diabetes. She is not taking metformin currently and notes carbohydrate cravings. She continues to work on diet and exercise to decrease risk of diabetes.  ASSESSMENT AND PLAN:  Vitamin D deficiency - Plan: Lipid Panel With LDL/HDL Ratio, VITAMIN D 25 Hydroxy (Vit-D Deficiency, Fractures), Vitamin D, Ergocalciferol, (DRISDOL) 1.25 MG (50000 UT) CAPS capsule  Insulin resistance - Plan: Comprehensive metabolic panel, Hemoglobin A1c, Insulin, random, Lipid Panel With LDL/HDL  Ratio  Class 1 obesity with serious comorbidity and body mass index (BMI) of 34.0 to 34.9 in adult, unspecified obesity type  PLAN:  Vitamin D Deficiency Donna Vasquez was informed that low vitamin D levels contributes to fatigue and are associated with obesity, breast, and colon cancer. Donna Vasquez agrees to continue taking prescription Vit D @50 ,000 IU every week #4 and we will refill for 1 month. She will follow up for routine testing of vitamin D, at least 2-3 times per year. She was informed of the risk of over-replacement of vitamin D and agrees to not increase her dose unless she discusses this with us first. We will check Vit D level today. Donna Vasquez agrees to follow up with our clinic in 2 weeks.  Insulin Resistance Donna Vasquez will continue to work on weight loss, exercise, and decreasing simple carbohydrates in her diet to help decrease the risk of diabetes. We dicussed metformin including benefits and risks. She was informed that eating too many simple carbohydrates or too many calories at one sitting increases the likelihood of GI side effects. Donna Vasquez declined metformin for now and prescription was not written today. We will check insulin and Hgb A1c today. Donna Vasquez agrees to follow up with our clinic in 2 weeks as directed to monitor her progress.  Obesity Donna Vasquez is currently in the action stage of change. As such, her goal is to continue with weight loss efforts She has agreed to follow the Category 2 plan Donna Vasquez has been instructed to work up to a goal of 150 minutes of combined cardio and strengthening exercise per week or physical activity for 15-30 minutes 2-3 times  per week for weight loss and overall health benefits. We discussed the following Behavioral Modification Strategies today: work on meal planning and easy cooking plans, emotional eating strategies and ways to avoid boredom eating, better snacking choices, and planning for success   [name] has agreed to follow up with our clinic  in 2 weeks. She was informed of the importance of frequent follow up visits to maximize her success with intensive lifestyle modifications for her multiple health conditions.  ALLERGIES: Allergies  Allergen Reactions   Flonase [Fluticasone Propionate] Other (See Comments)    Nasal irritation, burning, but can tolerate advair inhaler.     Sulfa Antibiotics Hives    MEDICATIONS: Current Outpatient Medications on File Prior to Visit  Medication Sig Dispense Refill   albuterol (PROAIR HFA) 108 (90 Base) MCG/ACT inhaler Inhale 1-2 puffs into the lungs every 6 (six) hours as needed for wheezing or shortness of breath. 1 Inhaler 0   aspirin 81 MG tablet Take 81 mg by mouth daily.     aspirin-acetaminophen-caffeine (EXCEDRIN MIGRAINE) 250-250-65 MG tablet Take by mouth every 6 (six) hours as needed for headache.     buPROPion (WELLBUTRIN XL) 300 MG 24 hr tablet Take 1 tablet (300 mg total) by mouth daily. 90 tablet 3   dextromethorphan-guaiFENesin (MUCINEX DM) 30-600 MG 12hr tablet Take 1 tablet by mouth 2 (two) times daily as needed for cough.     Fluticasone-Salmeterol (ADVAIR) 100-50 MCG/DOSE AEPB Inhale 1 puff into the lungs 2 (two) times daily.     montelukast (SINGULAIR) 10 MG tablet TAKE 1 TABLET (10 MG TOTAL) BY MOUTH AT BEDTIME. 30 tablet 0   niacin (NIASPAN) 1000 MG CR tablet TAKE 1 TABLET BY MOUTH AT  BEDTIME 90 tablet 2   omeprazole (PRILOSEC) 20 MG capsule Take 1 capsule (20 mg total) by mouth daily. 30 capsule 2   sertraline (ZOLOFT) 100 MG tablet TAKE 1 TABLET BY MOUTH  DAILY 90 tablet 2   simvastatin (ZOCOR) 20 MG tablet TAKE 1 TABLET BY MOUTH  DAILY AT 6 PM. 90 tablet 2   traZODone (DESYREL) 50 MG tablet Take 0.5-1 tablets (25-50 mg total) by mouth at bedtime as needed for sleep. 90 tablet 3   triamcinolone cream (KENALOG) 0.1 % Apply 1 application topically 2 (two) times daily as needed. 45 g 1   No current facility-administered medications on file prior to visit.      PAST MEDICAL HISTORY: Past Medical History:  Diagnosis Date   Allergy    Asthma    Back pain    Bulging lumbar disc    Chicken pox    Constipation    Depression    Fatigue    Frequent headaches    Gall bladder disease    Headache    Heartburn    Hyperlipidemia    IBS (irritable bowel syndrome)    Interstitial cystitis    RA (rheumatoid arthritis) (HCC)    Shortness of breath on exertion    Trouble in sleeping    Urine incontinence    Vertigo    Vision changes    Vitamin D deficiency     PAST SURGICAL HISTORY: Past Surgical History:  Procedure Laterality Date   CHOLECYSTECTOMY     cyst removed from back     NASAL SINUS SURGERY     TUBAL LIGATION  1999    SOCIAL HISTORY: Social History   Tobacco Use   Smoking status: Never Smoker   Smokeless tobacco: Never Used  Substance  Use Topics   Alcohol use: Yes    Alcohol/week: 0.0 standard drinks    Comment: rare--wine   Drug use: No    FAMILY HISTORY: Family History  Problem Relation Age of Onset   COPD Mother    Heart disease Mother    Polymyalgia rheumatica Mother    Emphysema Mother    Stroke Mother    Depression Mother    Anxiety disorder Mother    Heart disease Father    Hypertension Father    Hyperlipidemia Father    Anxiety disorder Father    Cancer Paternal Aunt        Breast   Breast cancer Paternal Aunt 42   Stroke Maternal Grandmother    Cancer Maternal Aunt     ROS: Review of Systems  Constitutional: Positive for malaise/fatigue. Negative for weight loss.  Gastrointestinal: Negative for nausea and vomiting.  Musculoskeletal:       Negative muscle weakness    PHYSICAL EXAM: Pt in no acute distress  RECENT LABS AND TESTS: BMET    Component Value Date/Time   NA 140 04/02/2018 0844   K 3.9 04/02/2018 0844   CL 99 04/02/2018 0844   CO2 23 04/02/2018 0844   GLUCOSE 90 04/02/2018 0844   GLUCOSE 93 11/13/2015 1501   BUN 18  04/02/2018 0844   CREATININE 0.99 04/02/2018 0844   CALCIUM 9.3 04/02/2018 0844   GFRNONAA 65 04/02/2018 0844   GFRAA 75 04/02/2018 0844   Lab Results  Component Value Date   HGBA1C 5.0 04/02/2018   HGBA1C 5.1 11/17/2017   HGBA1C 5.0 11/13/2015   Lab Results  Component Value Date   INSULIN 10.1 04/02/2018   INSULIN 8.7 11/17/2017   CBC    Component Value Date/Time   WBC 6.0 10/16/2017 1000   WBC 8.6 11/13/2015 1501   RBC 4.39 10/16/2017 1000   RBC 4.49 11/13/2015 1501   HGB 13.4 10/16/2017 1000   HCT 40.2 10/16/2017 1000   PLT 229 10/16/2017 1000   MCV 92 10/16/2017 1000   MCH 30.5 10/16/2017 1000   MCHC 33.3 10/16/2017 1000   MCHC 34.0 11/13/2015 1501   RDW 13.3 10/16/2017 1000   LYMPHSABS 1.4 10/16/2017 1000   EOSABS 0.2 10/16/2017 1000   BASOSABS 0.0 10/16/2017 1000   Iron/TIBC/Ferritin/ %Sat No results found for: IRON, TIBC, FERRITIN, IRONPCTSAT Lipid Panel     Component Value Date/Time   CHOL 134 04/02/2018 0844   TRIG 87 04/02/2018 0844   HDL 52 04/02/2018 0844   CHOLHDL 2.6 04/02/2018 0844   CHOLHDL 3 11/13/2015 1501   VLDL 29.8 11/13/2015 1501   LDLCALC 65 04/02/2018 0844   Hepatic Function Panel     Component Value Date/Time   PROT 6.4 04/02/2018 0844   ALBUMIN 4.4 04/02/2018 0844   AST 23 04/02/2018 0844   ALT 22 04/02/2018 0844   ALKPHOS 98 04/02/2018 0844   BILITOT 0.4 04/02/2018 0844      Component Value Date/Time   TSH 1.990 11/17/2017 1158   TSH 2.280 10/16/2017 1000   TSH 2.12 11/13/2015 1501      I, Burt Knack, am acting as transcriptionist for Debbra Riding, MD  I have reviewed the above documentation for accuracy and completeness, and I agree with the above. - Debbra Riding, MD

## 2018-09-10 LAB — INSULIN, RANDOM: INSULIN: 17.3 u[IU]/mL (ref 2.6–24.9)

## 2018-09-10 LAB — COMPREHENSIVE METABOLIC PANEL
ALT: 19 IU/L (ref 0–32)
AST: 21 IU/L (ref 0–40)
Albumin/Globulin Ratio: 2.2 (ref 1.2–2.2)
Albumin: 4.3 g/dL (ref 3.8–4.9)
Alkaline Phosphatase: 102 IU/L (ref 39–117)
BUN/Creatinine Ratio: 13 (ref 9–23)
BUN: 14 mg/dL (ref 6–24)
Bilirubin Total: 0.3 mg/dL (ref 0.0–1.2)
CO2: 24 mmol/L (ref 20–29)
Calcium: 9.3 mg/dL (ref 8.7–10.2)
Chloride: 104 mmol/L (ref 96–106)
Creatinine, Ser: 1.04 mg/dL — ABNORMAL HIGH (ref 0.57–1.00)
GFR calc Af Amer: 71 mL/min/{1.73_m2} (ref >59)
GFR calc non Af Amer: 61 mL/min/{1.73_m2} (ref >59)
Globulin, Total: 2 g/dL (ref 1.5–4.5)
Glucose: 89 mg/dL (ref 65–99)
Potassium: 4 mmol/L (ref 3.5–5.2)
Sodium: 144 mmol/L (ref 134–144)
Total Protein: 6.3 g/dL (ref 6.0–8.5)

## 2018-09-10 LAB — LIPID PANEL WITH LDL/HDL RATIO
Cholesterol, Total: 153 mg/dL (ref 100–199)
HDL: 53 mg/dL (ref >39)
LDL Calculated: 68 mg/dL (ref 0–99)
LDl/HDL Ratio: 1.3 ratio (ref 0.0–3.2)
Triglycerides: 160 mg/dL — ABNORMAL HIGH (ref 0–149)
VLDL Cholesterol Cal: 32 mg/dL (ref 5–40)

## 2018-09-10 LAB — HEMOGLOBIN A1C
Est. average glucose Bld gHb Est-mCnc: 100 mg/dL
Hgb A1c MFr Bld: 5.1 % (ref 4.8–5.6)

## 2018-09-10 LAB — VITAMIN D 25 HYDROXY (VIT D DEFICIENCY, FRACTURES): Vit D, 25-Hydroxy: 31.5 ng/mL (ref 30.0–100.0)

## 2018-09-14 ENCOUNTER — Ambulatory Visit (INDEPENDENT_AMBULATORY_CARE_PROVIDER_SITE_OTHER): Payer: Managed Care, Other (non HMO) | Admitting: Family Medicine

## 2018-09-14 ENCOUNTER — Encounter (INDEPENDENT_AMBULATORY_CARE_PROVIDER_SITE_OTHER): Payer: Self-pay | Admitting: Family Medicine

## 2018-09-14 ENCOUNTER — Other Ambulatory Visit: Payer: Self-pay

## 2018-09-14 DIAGNOSIS — E559 Vitamin D deficiency, unspecified: Secondary | ICD-10-CM | POA: Diagnosis not present

## 2018-09-14 DIAGNOSIS — Z6834 Body mass index (BMI) 34.0-34.9, adult: Secondary | ICD-10-CM | POA: Diagnosis not present

## 2018-09-14 DIAGNOSIS — E8881 Metabolic syndrome: Secondary | ICD-10-CM | POA: Diagnosis not present

## 2018-09-14 DIAGNOSIS — E669 Obesity, unspecified: Secondary | ICD-10-CM

## 2018-09-15 NOTE — Progress Notes (Signed)
Office: [phone]  /  Fax: [phone] TeleHealth Visit:  Donna Vasquez has verbally consented to this TeleHealth visit today. The patient is located at home, the provider is located at the [facility] office. The participants in this visit include the listed provider and patient. The visit was conducted today via face time.  HPI:   Chief Complaint: OBESITY Donna Vasquez is here to discuss her progress with her obesity treatment plan. She is on the Category 2 plan and is following her eating plan approximately 60-70 % of the time. She states she is walking for 20 minutes 1 time per week. Donna Vasquez voices minimal motivation for physical activity. It may be because she is home and not going somewhere for activity. She voices following the plan to be easier than incorporating activity. We were unable to weigh the patient today for this TeleHealth visit. She feels as if she has gained weight since her last visit. She has lost 18 lbs since starting treatment with us.  Vitamin D Deficiency Donna Vasquez has a diagnosis of vitamin D deficiency. She is currently taking prescription Vit D. She notes fatigue and denies nausea, vomiting or muscle weakness.  Insulin Resistance Donna Vasquez has a diagnosis of insulin resistance based on her elevated fasting insulin level >5. Last insulin level higher than previously. Although Krislyn's blood glucose readings are still under good control, insulin resistance puts her at greater risk of metabolic syndrome and diabetes. She is not taking metformin currently and notes carbohydrate cravings previously. She continues to work on diet and exercise to decrease risk of diabetes.  ASSESSMENT AND PLAN:  Vitamin D deficiency  Insulin resistance  Class 1 obesity with serious comorbidity and body mass index (BMI) of 34.0 to 34.9 in adult, unspecified obesity type  PLAN:  Vitamin D Deficiency Donna Vasquez was informed that low vitamin D levels contributes to  fatigue and are associated with obesity, breast, and colon cancer. Donna Vasquez agrees to continue taking prescription Vit D @50 ,000 IU every week, no refill needed. She will follow up for routine testing of vitamin D, at least 2-3 times per year. She was informed of the risk of over-replacement of vitamin D and agrees to not increase her dose unless she discusses this with us first. Donna Vasquez agrees to follow up with our clinic in 2 weeks.  Insulin Resistance Donna Vasquez will continue to work on weight loss, exercise, and decreasing simple carbohydrates in her diet to help decrease the risk of diabetes. We dicussed metformin including benefits and risks. She was informed that eating too many simple carbohydrates or too many calories at one sitting increases the likelihood of GI side effects. Donna Vasquez declined metformin for now and prescription was not written today. We will retest at the end of July. Donna Vasquez agrees to follow up with our clinic in 2 weeks as directed to monitor her progress.  Obesity Donna Vasquez is currently in the action stage of change. As such, her goal is to continue with weight loss efforts She has agreed to follow the Category 2 plan Donna Vasquez has been instructed to work up to a goal of 150 minutes of combined cardio and strengthening exercise per week for weight loss and overall health benefits. We discussed the following Behavioral Modification Strategies today: increasing lean protein intake, increasing vegetables and work on meal planning and easy cooking plans, and planning for success   Donna Vasquez has agreed to follow up with our clinic in 2 weeks. She was informed of the importance of frequent follow up  visits to maximize her success with intensive lifestyle modifications for her multiple health conditions.  ALLERGIES: Allergies  Allergen Reactions  . Flonase [Fluticasone Propionate] Other (See Comments)    Nasal irritation, burning, but can tolerate advair inhaler.    . Sulfa  Antibiotics Hives    MEDICATIONS: Current Outpatient Medications on File Prior to Visit  Medication Sig Dispense Refill  . albuterol (PROAIR HFA) 108 (90 Base) MCG/ACT inhaler Inhale 1-2 puffs into the lungs every 6 (six) hours as needed for wheezing or shortness of breath. 1 Inhaler 0  . aspirin 81 MG tablet Take 81 mg by mouth daily.    [name] aspirin-acetaminophen-caffeine (EXCEDRIN MIGRAINE) 250-250-65 MG tablet Take by mouth every 6 (six) hours as needed for headache.    [name] buPROPion (WELLBUTRIN XL) 300 MG 24 hr tablet Take 1 tablet (300 mg total) by mouth daily. 90 tablet 3  . dextromethorphan-guaiFENesin (MUCINEX DM) 30-600 MG 12hr tablet Take 1 tablet by mouth 2 (two) times daily as needed for cough.    . Fluticasone-Salmeterol (ADVAIR) 100-50 MCG/DOSE AEPB Inhale 1 puff into the lungs 2 (two) times daily.    . montelukast (SINGULAIR) 10 MG tablet TAKE 1 TABLET (10 MG TOTAL) BY MOUTH AT BEDTIME. 30 tablet 0  . niacin (NIASPAN) 1000 MG CR tablet TAKE 1 TABLET BY MOUTH AT  BEDTIME 90 tablet 2  . omeprazole (PRILOSEC) 20 MG capsule Take 1 capsule (20 mg total) by mouth daily. 30 capsule 2  . sertraline (ZOLOFT) 100 MG tablet TAKE 1 TABLET BY MOUTH  DAILY 90 tablet 2  . simvastatin (ZOCOR) 20 MG tablet TAKE 1 TABLET BY MOUTH  DAILY AT 6 PM. 90 tablet 2  . traZODone (DESYREL) 50 MG tablet Take 0.5-1 tablets (25-50 mg total) by mouth at bedtime as needed for sleep. 90 tablet 3  . triamcinolone cream (KENALOG) 0.1 % Apply 1 application topically 2 (two) times daily as needed. 45 g 1  . Vitamin D, Ergocalciferol, (DRISDOL) 1.25 MG (50000 UT) CAPS capsule Take 1 capsule (50,000 Units total) by mouth every 7 (seven) days for 30 days. 4 capsule 0   No current facility-administered medications on file prior to visit.     PAST MEDICAL HISTORY: Past Medical History:  Diagnosis Date  . Allergy   . Asthma   . Back pain   . Bulging lumbar disc   . Chicken pox   . Constipation   . Depression   .  Fatigue   . Frequent headaches   . Gall bladder disease   . Headache   . Heartburn   . Hyperlipidemia   . IBS (irritable bowel syndrome)   . Interstitial cystitis   . RA (rheumatoid arthritis) (HCC)   . Shortness of breath on exertion   . Trouble in sleeping   . Urine incontinence   . Vertigo   . Vision changes   . Vitamin D deficiency     PAST SURGICAL HISTORY: Past Surgical History:  Procedure Laterality Date  . CHOLECYSTECTOMY    . cyst removed from back    . NASAL SINUS SURGERY    . TUBAL LIGATION  1999    SOCIAL HISTORY: Social History   Tobacco Use  . Smoking status: Never Smoker  . Smokeless tobacco: Never Used  Substance Use Topics  . Alcohol use: Yes    Alcohol/week: 0.0 standard drinks    Comment: rare--wine  . Drug use: No    FAMILY HISTORY: Family History  Problem Relation Age  of Onset  . COPD Mother   . Heart disease Mother   . Polymyalgia rheumatica Mother   . Emphysema Mother   . Stroke Mother   . Depression Mother   . Anxiety disorder Mother   . Heart disease Father   . Hypertension Father   . Hyperlipidemia Father   . Anxiety disorder Father   . Cancer Paternal Aunt        Breast  . Breast cancer Paternal Aunt 44  . Stroke Maternal Grandmother   . Cancer Maternal Aunt     ROS: Review of Systems  Constitutional: Positive for malaise/fatigue. Negative for weight loss.  Gastrointestinal: Negative for nausea and vomiting.  Musculoskeletal:       Negative muscle weakness    PHYSICAL EXAM: Pt in no acute distress  RECENT LABS AND TESTS: BMET    Component Value Date/Time   NA 144 09/09/2018 0932   K 4.0 09/09/2018 0932   CL 104 09/09/2018 0932   CO2 24 09/09/2018 0932   GLUCOSE 89 09/09/2018 0932   GLUCOSE 93 11/13/2015 1501   BUN 14 09/09/2018 0932   CREATININE 1.04 (H) 09/09/2018 0932   CALCIUM 9.3 09/09/2018 0932   GFRNONAA 61 09/09/2018 0932   GFRAA 71 09/09/2018 0932   Lab Results  Component Value Date   HGBA1C  5.1 09/09/2018   HGBA1C 5.0 04/02/2018   HGBA1C 5.1 11/17/2017   HGBA1C 5.0 11/13/2015   Lab Results  Component Value Date   INSULIN 17.3 09/09/2018   INSULIN 10.1 04/02/2018   INSULIN 8.7 11/17/2017   CBC    Component Value Date/Time   WBC 6.0 10/16/2017 1000   WBC 8.6 11/13/2015 1501   RBC 4.39 10/16/2017 1000   RBC 4.49 11/13/2015 1501   HGB 13.4 10/16/2017 1000   HCT 40.2 10/16/2017 1000   PLT 229 10/16/2017 1000   MCV 92 10/16/2017 1000   MCH 30.5 10/16/2017 1000   MCHC 33.3 10/16/2017 1000   MCHC 34.0 11/13/2015 1501   RDW 13.3 10/16/2017 1000   LYMPHSABS 1.4 10/16/2017 1000   EOSABS 0.2 10/16/2017 1000   BASOSABS 0.0 10/16/2017 1000   Iron/TIBC/Ferritin/ %Sat No results found for: IRON, TIBC, FERRITIN, IRONPCTSAT Lipid Panel     Component Value Date/Time   CHOL 153 09/09/2018 0932   TRIG 160 (H) 09/09/2018 0932   HDL 53 09/09/2018 0932   CHOLHDL 2.6 04/02/2018 0844   CHOLHDL 3 11/13/2015 1501   VLDL 29.8 11/13/2015 1501   LDLCALC 68 09/09/2018 0932   Hepatic Function Panel     Component Value Date/Time   PROT 6.3 09/09/2018 0932   ALBUMIN 4.3 09/09/2018 0932   AST 21 09/09/2018 0932   ALT 19 09/09/2018 0932   ALKPHOS 102 09/09/2018 0932   BILITOT 0.3 09/09/2018 0932      Component Value Date/Time   TSH 1.990 11/17/2017 1158   TSH 2.280 10/16/2017 1000   TSH 2.12 11/13/2015 1501      I, Burt Knack, am acting as transcriptionist for Debbra Riding, MD  I have reviewed the above documentation for accuracy and completeness, and I agree with the above. - Debbra Riding, MD

## 2018-09-28 ENCOUNTER — Other Ambulatory Visit: Payer: Self-pay

## 2018-09-28 ENCOUNTER — Ambulatory Visit (INDEPENDENT_AMBULATORY_CARE_PROVIDER_SITE_OTHER): Payer: Managed Care, Other (non HMO) | Admitting: Family Medicine

## 2018-09-28 ENCOUNTER — Encounter (INDEPENDENT_AMBULATORY_CARE_PROVIDER_SITE_OTHER): Payer: Self-pay | Admitting: Family Medicine

## 2018-09-28 DIAGNOSIS — E559 Vitamin D deficiency, unspecified: Secondary | ICD-10-CM | POA: Diagnosis not present

## 2018-09-28 DIAGNOSIS — Z6834 Body mass index (BMI) 34.0-34.9, adult: Secondary | ICD-10-CM | POA: Diagnosis not present

## 2018-09-28 DIAGNOSIS — E669 Obesity, unspecified: Secondary | ICD-10-CM

## 2018-09-28 DIAGNOSIS — J4520 Mild intermittent asthma, uncomplicated: Secondary | ICD-10-CM

## 2018-09-28 DIAGNOSIS — IMO0001 Reserved for inherently not codable concepts without codable children: Secondary | ICD-10-CM

## 2018-09-28 MED ORDER — MONTELUKAST SODIUM 10 MG PO TABS: ORAL_TABLET | ORAL | 0 refills | Status: DC

## 2018-09-29 NOTE — Progress Notes (Signed)
Office: [phone]  /  Fax: [phone] TeleHealth Visit:  Donna Vasquez has verbally consented to this TeleHealth visit today. The patient is located at home, the provider is located at the [facility] office. The participants in this visit include the listed provider and patient. The visit was conducted today via face time.  HPI:   Chief Complaint: OBESITY Donna Vasquez is here to discuss her progress with her obesity treatment plan. She is on the Category 2 plan and is following her eating plan approximately 60 % of the time. She states she is walking for 20 minutes 2 times per week. Donna Vasquez knows she has gained weight but unsure of the amount. She has found she has eaten more being at home, so she is trying to get back on track with her meal plan. Work is till stressing her as now she is limited in hours.  We were unable to weigh the patient today for this TeleHealth visit. She feels as if she has gained weight since her last visit. She has lost 18 lbs since starting treatment with [name].  Moderate Intermittent Asthma Donna Vasquez's symptoms are better controlled. She is able to do some physical activity.   Vitamin D Deficiency Donna Vasquez has a diagnosis of vitamin D deficiency. She is currently taking prescription Vit D. She notes fatigue and denies nausea, vomiting or muscle weakness.  ASSESSMENT AND PLAN:  Moderate intermittent asthma without complication - Plan: montelukast (SINGULAIR) 10 MG tablet  Vitamin D deficiency  Class 1 obesity with serious comorbidity and body mass index (BMI) of 34.0 to 34.9 in adult, unspecified obesity type  PLAN:  Moderate Intermittent Asthma Donna Vasquez agrees to continue taking Singulair 10 mg 1 tablet PO qhs #30 and we will refill for 1 month. Laveta agrees to follow up with our clinic in 2 weeks.  Vitamin D Deficiency Donna Vasquez was informed that low vitamin D levels contributes to fatigue and are associated with obesity, breast, and colon  cancer. Donna Vasquez agrees to continue taking prescription Vit D @50 ,000 IU every week, no refill needed. She will follow up for routine testing of vitamin D, at least 2-3 times per year. She was informed of the risk of over-replacement of vitamin D and agrees to not increase her dose unless she discusses this with [name] first. Donna Vasquez agrees to follow up with our clinic in 2 weeks.  Obesity Donna Vasquez is currently in the action stage of change. As such, her goal is to continue with weight loss efforts She has agreed to follow the Category 2 plan Donna Vasquez has been instructed to work up to a goal of 150 minutes of combined cardio and strengthening exercise per week for weight loss and overall health benefits. We discussed the following Behavioral Modification Strategies today: increasing lean protein intake, increasing vegetables, work on meal planning and easy cooking plans, emotional eating strategies, and keeping healthy foods in the home   Donna Vasquez has agreed to follow up with our clinic in 2 weeks. She was informed of the importance of frequent follow up visits to maximize her success with intensive lifestyle modifications for her multiple health conditions.  ALLERGIES: Allergies  Allergen Reactions  . Flonase [Fluticasone Propionate] Other (See Comments)    Nasal irritation, burning, but can tolerate advair inhaler.    . Sulfa Antibiotics Hives    MEDICATIONS: Current Outpatient Medications on File Prior to Visit  Medication Sig Dispense Refill  . albuterol (PROAIR HFA) 108 (90 Base) MCG/ACT inhaler Inhale 1-2 puffs into the  lungs every 6 (six) hours as needed for wheezing or shortness of breath. 1 Inhaler 0  . aspirin 81 MG tablet Take 81 mg by mouth daily.    [name]. aspirin-acetaminophen-caffeine (EXCEDRIN MIGRAINE) 250-250-65 MG tablet Take by mouth every 6 (six) hours as needed for headache.    [name]. buPROPion (WELLBUTRIN XL) 300 MG 24 hr tablet Take 1 tablet (300 mg total) by mouth daily. 90 tablet 3   . dextromethorphan-guaiFENesin (MUCINEX DM) 30-600 MG 12hr tablet Take 1 tablet by mouth 2 (two) times daily as needed for cough.    . Fluticasone-Salmeterol (ADVAIR) 100-50 MCG/DOSE AEPB Inhale 1 puff into the lungs 2 (two) times daily.    . niacin (NIASPAN) 1000 MG CR tablet TAKE 1 TABLET BY MOUTH AT  BEDTIME 90 tablet 2  . omeprazole (PRILOSEC) 20 MG capsule Take 1 capsule (20 mg total) by mouth daily. 30 capsule 2  . sertraline (ZOLOFT) 100 MG tablet TAKE 1 TABLET BY MOUTH  DAILY 90 tablet 2  . simvastatin (ZOCOR) 20 MG tablet TAKE 1 TABLET BY MOUTH  DAILY AT 6 PM. 90 tablet 2  . traZODone (DESYREL) 50 MG tablet Take 0.5-1 tablets (25-50 mg total) by mouth at bedtime as needed for sleep. 90 tablet 3  . triamcinolone cream (KENALOG) 0.1 % Apply 1 application topically 2 (two) times daily as needed. 45 g 1  . Vitamin D, Ergocalciferol, (DRISDOL) 1.25 MG (50000 UT) CAPS capsule Take 1 capsule (50,000 Units total) by mouth every 7 (seven) days for 30 days. 4 capsule 0   No current facility-administered medications on file prior to visit.     PAST MEDICAL HISTORY: Past Medical History:  Diagnosis Date  . Allergy   . Asthma   . Back pain   . Bulging lumbar disc   . Chicken pox   . Constipation   . Depression   . Fatigue   . Frequent headaches   . Gall bladder disease   . Headache   . Heartburn   . Hyperlipidemia   . IBS (irritable bowel syndrome)   . Interstitial cystitis   . RA (rheumatoid arthritis) (HCC)   . Shortness of breath on exertion   . Trouble in sleeping   . Urine incontinence   . Vertigo   . Vision changes   . Vitamin D deficiency     PAST SURGICAL HISTORY: Past Surgical History:  Procedure Laterality Date  . CHOLECYSTECTOMY    . cyst removed from back    . NASAL SINUS SURGERY    . TUBAL LIGATION  1999    SOCIAL HISTORY: Social History   Tobacco Use  . Smoking status: Never Smoker  . Smokeless tobacco: Never Used  Substance Use Topics  . Alcohol  use: Yes    Alcohol/week: 0.0 standard drinks    Comment: rare--wine  . Drug use: No    FAMILY HISTORY: Family History  Problem Relation Age of Onset  . COPD Mother   . Heart disease Mother   . Polymyalgia rheumatica Mother   . Emphysema Mother   . Stroke Mother   . Depression Mother   . Anxiety disorder Mother   . Heart disease Father   . Hypertension Father   . Hyperlipidemia Father   . Anxiety disorder Father   . Cancer Paternal Aunt        Breast  . Breast cancer Paternal Aunt [id number]  . Stroke Maternal Grandmother   . Cancer Maternal Aunt     ROS:  Review of Systems  Constitutional: Positive for malaise/fatigue. Negative for weight loss.  Respiratory:       + Asthma  Gastrointestinal: Negative for nausea and vomiting.  Musculoskeletal:       Negative muscle weakness    PHYSICAL EXAM: Pt in no acute distress  RECENT LABS AND TESTS: BMET    Component Value Date/Time   NA 144 09/09/2018 0932   K 4.0 09/09/2018 0932   CL 104 09/09/2018 0932   CO2 24 09/09/2018 0932   GLUCOSE 89 09/09/2018 0932   GLUCOSE 93 11/13/2015 1501   BUN 14 09/09/2018 0932   CREATININE 1.04 (H) 09/09/2018 0932   CALCIUM 9.3 09/09/2018 0932   GFRNONAA 61 09/09/2018 0932   GFRAA 71 09/09/2018 0932   Lab Results  Component Value Date   HGBA1C 5.1 09/09/2018   HGBA1C 5.0 04/02/2018   HGBA1C 5.1 11/17/2017   HGBA1C 5.0 11/13/2015   Lab Results  Component Value Date   INSULIN 17.3 09/09/2018   INSULIN 10.1 04/02/2018   INSULIN 8.7 11/17/2017   CBC    Component Value Date/Time   WBC 6.0 10/16/2017 1000   WBC 8.6 11/13/2015 1501   RBC 4.39 10/16/2017 1000   RBC 4.49 11/13/2015 1501   HGB 13.4 10/16/2017 1000   HCT 40.2 10/16/2017 1000   PLT 229 10/16/2017 1000   MCV 92 10/16/2017 1000   MCH 30.5 10/16/2017 1000   MCHC 33.3 10/16/2017 1000   MCHC 34.0 11/13/2015 1501   RDW 13.3 10/16/2017 1000   LYMPHSABS 1.4 10/16/2017 1000   EOSABS 0.2 10/16/2017 1000   BASOSABS 0.0  10/16/2017 1000   Iron/TIBC/Ferritin/ %Sat No results found for: IRON, TIBC, FERRITIN, IRONPCTSAT Lipid Panel     Component Value Date/Time   CHOL 153 09/09/2018 0932   TRIG 160 (H) 09/09/2018 0932   HDL 53 09/09/2018 0932   CHOLHDL 2.6 04/02/2018 0844   CHOLHDL 3 11/13/2015 1501   VLDL 29.8 11/13/2015 1501   LDLCALC 68 09/09/2018 0932   Hepatic Function Panel     Component Value Date/Time   PROT 6.3 09/09/2018 0932   ALBUMIN 4.3 09/09/2018 0932   AST 21 09/09/2018 0932   ALT 19 09/09/2018 0932   ALKPHOS 102 09/09/2018 0932   BILITOT 0.3 09/09/2018 0932      Component Value Date/Time   TSH 1.990 11/17/2017 1158   TSH 2.280 10/16/2017 1000   TSH 2.12 11/13/2015 1501      I, Burt Knack, am acting as transcriptionist for Debbra Riding, MD   I have reviewed the above documentation for accuracy and completeness, and I agree with the above. - Debbra Riding, MD

## 2018-10-13 ENCOUNTER — Ambulatory Visit (INDEPENDENT_AMBULATORY_CARE_PROVIDER_SITE_OTHER): Payer: Managed Care, Other (non HMO) | Admitting: Family Medicine

## 2018-10-13 ENCOUNTER — Encounter (INDEPENDENT_AMBULATORY_CARE_PROVIDER_SITE_OTHER): Payer: Self-pay | Admitting: Family Medicine

## 2018-10-13 ENCOUNTER — Other Ambulatory Visit: Payer: Self-pay

## 2018-10-13 DIAGNOSIS — Z6834 Body mass index (BMI) 34.0-34.9, adult: Secondary | ICD-10-CM | POA: Diagnosis not present

## 2018-10-13 DIAGNOSIS — F3289 Other specified depressive episodes: Secondary | ICD-10-CM | POA: Diagnosis not present

## 2018-10-13 DIAGNOSIS — E669 Obesity, unspecified: Secondary | ICD-10-CM

## 2018-10-13 DIAGNOSIS — E8881 Metabolic syndrome: Secondary | ICD-10-CM

## 2018-10-13 NOTE — Progress Notes (Signed)
Office: [phone]  /  Fax: [phone] TeleHealth Visit:  Donna RamaKathryn R Vasquez has verbally consented to this TeleHealth visit today. The patient is located at home, the provider is located at the [facility] office. The participants in this visit include the listed provider and patient. The visit was conducted today via face time.  HPI:   Chief Complaint: OBESITY Donna Vasquez is here to discuss her progress with her obesity treatment plan. She is on the Category 2 plan and is following her eating plan approximately 60-70 % of the time. She states she walked 1 mile for 25 minutes 1 time per week and walked 2.5 miles for 35-40 minutes on Saturday. Donna Vasquez had an enjoyable Memorial Day weekend. She got to see her parents and nieces and nephews. She hasn't stepped on the scale. She reports that she still does occasionally indulge in carbohydrates, especially bread. She occasionally has difficulty in finding things on the meal plan, as she and her husband only go to the store.  We were unable to weigh the patient today for this TeleHealth visit. She feels as if she has maintained her weight since her last visit. She has lost 18 lbs since starting treatment with us.  Insulin Resistance Donna Vasquez has a diagnosis of insulin resistance based on her elevated fasting insulin level >5. Although Donna Vasquez's blood glucose readings are still under good control, insulin resistance puts her at greater risk of metabolic syndrome and diabetes. She notes carbohydrate cravings in form of bread and sweets. She is looking for healthier alternatives for her cravings. She is not taking metformin currently and continues to work on diet and exercise to decrease risk of diabetes.  Depression with Emotional Eating Behaviors Donna Vasquez's blood pressure previously controlled. She notes her symptoms are better controlled on Wellbutrin. Donna Vasquez struggles with emotional eating and using food for comfort to the extent that it  is negatively impacting her health. She often snacks when she is not hungry. Donna Vasquez sometimes feels she is out of control and then feels guilty that she made poor food choices. She has been working on behavior modification techniques to help reduce her emotional eating and has been somewhat successful. She shows no sign of suicidal or homicidal ideations.  [facility] 05/25/2018 11/17/2017 10/16/2017 09/15/2017 02/20/2017  Decreased Interest 1 3 1 3  -  Down, Depressed, Hopeless 1 3 1 3  0  PHQ - 2 Score 2 6 2 6  0  Altered sleeping 0 2 2 3  -  Tired, decreased energy 1 3 2 3  -  Change in appetite 0 3 2 3  -  Feeling bad or failure about yourself  1 0 2 3 -  Trouble concentrating 1 3 1 3  -  Moving slowly or fidgety/restless 0 0 0 0 -  Suicidal thoughts 0 0 0 0 -  PHQ-9 Score 5 17 11 21  -  Difficult doing work/chores Somewhat difficult Somewhat difficult Very difficult Very difficult -    ASSESSMENT AND PLAN:  Insulin resistance  Other depression - with emotional eating  Class 1 obesity with serious comorbidity and body mass index (BMI) of 34.0 to 34.9 in adult, unspecified obesity type  PLAN:  Insulin Resistance Donna Vasquez will continue to work on weight loss, exercise, and decreasing simple carbohydrates in her diet to help decrease the risk of diabetes. We dicussed metformin including benefits and risks. She was informed that eating too many simple carbohydrates or too many calories at one sitting increases the likelihood of GI side  effects. Chrishonda declined metformin for now and prescription was not written today. We will repeat labs in early August. Donna Vasquez agrees to follow up with our clinic in 2 weeks as directed to monitor her progress.  Depression with Emotional Eating Behaviors We discussed behavior modification techniques today to help Donna Vasquez deal with her emotional eating and depression. Donna Vasquez agrees to continue taking Wellbutrin XL, and she agrees to follow up with our  clinic in 2 weeks.  Obesity Donna Vasquez is currently in the action stage of change. As such, her goal is to continue with weight loss efforts She has agreed to follow the Category 2 plan Donna Vasquez has been instructed to work up to a goal of 150 minutes of combined cardio and strengthening exercise per week for weight loss and overall health benefits. We discussed the following Behavioral Modification Strategies today: increasing lean protein intake, increasing vegetables, work on meal planning and easy cooking plans and avoiding temptations, keeping healthy foods in the home, and planning for success   Donna Vasquez has agreed to follow up with our clinic in 2 weeks. She was informed of the importance of frequent follow up visits to maximize her success with intensive lifestyle modifications for her multiple health conditions.  ALLERGIES: Allergies  Allergen Reactions  . Flonase [Fluticasone Propionate] Other (See Comments)    Nasal irritation, burning, but can tolerate advair inhaler.    . Sulfa Antibiotics Hives    MEDICATIONS: Current Outpatient Medications on File Prior to Visit  Medication Sig Dispense Refill  . albuterol (PROAIR HFA) 108 (90 Base) MCG/ACT inhaler Inhale 1-2 puffs into the lungs every 6 (six) hours as needed for wheezing or shortness of breath. 1 Inhaler 0  . aspirin 81 MG tablet Take 81 mg by mouth daily.    [name] aspirin-acetaminophen-caffeine (EXCEDRIN MIGRAINE) 250-250-65 MG tablet Take by mouth every 6 (six) hours as needed for headache.    [name] buPROPion (WELLBUTRIN XL) 300 MG 24 hr tablet Take 1 tablet (300 mg total) by mouth daily. 90 tablet 3  . dextromethorphan-guaiFENesin (MUCINEX DM) 30-600 MG 12hr tablet Take 1 tablet by mouth 2 (two) times daily as needed for cough.    . Fluticasone-Salmeterol (ADVAIR) 100-50 MCG/DOSE AEPB Inhale 1 puff into the lungs 2 (two) times daily.    . montelukast (SINGULAIR) 10 MG tablet TAKE 1 TABLET (10 MG TOTAL) BY MOUTH AT BEDTIME. 30 tablet  0  . niacin (NIASPAN) 1000 MG CR tablet TAKE 1 TABLET BY MOUTH AT  BEDTIME 90 tablet 2  . omeprazole (PRILOSEC) 20 MG capsule Take 1 capsule (20 mg total) by mouth daily. 30 capsule 2  . sertraline (ZOLOFT) 100 MG tablet TAKE 1 TABLET BY MOUTH  DAILY 90 tablet 2  . simvastatin (ZOCOR) 20 MG tablet TAKE 1 TABLET BY MOUTH  DAILY AT 6 PM. 90 tablet 2  . traZODone (DESYREL) 50 MG tablet Take 0.5-1 tablets (25-50 mg total) by mouth at bedtime as needed for sleep. 90 tablet 3  . triamcinolone cream (KENALOG) 0.1 % Apply 1 application topically 2 (two) times daily as needed. 45 g 1   No current facility-administered medications on file prior to visit.     PAST MEDICAL HISTORY: Past Medical History:  Diagnosis Date  . Allergy   . Asthma   . Back pain   . Bulging lumbar disc   . Chicken pox   . Constipation   . Depression   . Fatigue   . Frequent headaches   . Gall bladder disease   .  Headache   . Heartburn   . Hyperlipidemia   . IBS (irritable bowel syndrome)   . Interstitial cystitis   . RA (rheumatoid arthritis) (HCC)   . Shortness of breath on exertion   . Trouble in sleeping   . Urine incontinence   . Vertigo   . Vision changes   . Vitamin D deficiency     PAST SURGICAL HISTORY: Past Surgical History:  Procedure Laterality Date  . CHOLECYSTECTOMY    . cyst removed from back    . NASAL SINUS SURGERY    . TUBAL LIGATION  1999    SOCIAL HISTORY: Social History   Tobacco Use  . Smoking status: Never Smoker  . Smokeless tobacco: Never Used  Substance Use Topics  . Alcohol use: Yes    Alcohol/week: 0.0 standard drinks    Comment: rare--wine  . Drug use: No    FAMILY HISTORY: Family History  Problem Relation Age of Onset  . COPD Mother   . Heart disease Mother   . Polymyalgia rheumatica Mother   . Emphysema Mother   . Stroke Mother   . Depression Mother   . Anxiety disorder Mother   . Heart disease Father   . Hypertension Father   . Hyperlipidemia Father    . Anxiety disorder Father   . Cancer Paternal Aunt        Breast  . Breast cancer Paternal Aunt [id number]  . Stroke Maternal Grandmother   . Cancer Maternal Aunt     ROS: Review of Systems  Constitutional: Negative for weight loss.  Psychiatric/Behavioral: Positive for depression. Negative for suicidal ideas.    PHYSICAL EXAM: Pt in no acute distress  RECENT LABS AND TESTS: BMET    Component Value Date/Time   NA 144 09/09/2018 0932   K 4.0 09/09/2018 0932   CL 104 09/09/2018 0932   CO2 24 09/09/2018 0932   GLUCOSE 89 09/09/2018 0932   GLUCOSE 93 11/13/2015 1501   BUN 14 09/09/2018 0932   CREATININE 1.04 (H) 09/09/2018 0932   CALCIUM 9.3 09/09/2018 0932   GFRNONAA 61 09/09/2018 0932   GFRAA 71 09/09/2018 0932   Lab Results  Component Value Date   HGBA1C 5.1 09/09/2018   HGBA1C 5.0 04/02/2018   HGBA1C 5.1 11/17/2017   HGBA1C 5.0 11/13/2015   Lab Results  Component Value Date   INSULIN 17.3 09/09/2018   INSULIN 10.1 04/02/2018   INSULIN 8.7 11/17/2017   CBC    Component Value Date/Time   WBC 6.0 10/16/2017 1000   WBC 8.6 11/13/2015 1501   RBC 4.39 10/16/2017 1000   RBC 4.49 11/13/2015 1501   HGB 13.4 10/16/2017 1000   HCT 40.2 10/16/2017 1000   PLT 229 10/16/2017 1000   MCV 92 10/16/2017 1000   MCH 30.5 10/16/2017 1000   MCHC 33.3 10/16/2017 1000   MCHC 34.0 11/13/2015 1501   RDW 13.3 10/16/2017 1000   LYMPHSABS 1.4 10/16/2017 1000   EOSABS 0.2 10/16/2017 1000   BASOSABS 0.0 10/16/2017 1000   Iron/TIBC/Ferritin/ %Sat No results found for: IRON, TIBC, FERRITIN, IRONPCTSAT Lipid Panel     Component Value Date/Time   CHOL 153 09/09/2018 0932   TRIG 160 (H) 09/09/2018 0932   HDL 53 09/09/2018 0932   CHOLHDL 2.6 04/02/2018 0844   CHOLHDL 3 11/13/2015 1501   VLDL 29.8 11/13/2015 1501   LDLCALC 68 09/09/2018 0932   Hepatic Function Panel     Component Value Date/Time   PROT 6.3 09/09/2018 0932  ALBUMIN 4.3 09/09/2018 0932   AST 21 09/09/2018 0932    ALT 19 09/09/2018 0932   ALKPHOS 102 09/09/2018 0932   BILITOT 0.3 09/09/2018 0932      Component Value Date/Time   TSH 1.990 11/17/2017 1158   TSH 2.280 10/16/2017 1000   TSH 2.12 11/13/2015 1501      I, Burt KnackSharon Martin, am acting as transcriptionist for Debbra RidingAlexandria Kadolph, MD  I have reviewed the above documentation for accuracy and completeness, and I agree with the above. - Debbra RidingAlexandria Kadolph, MD

## 2018-10-27 ENCOUNTER — Other Ambulatory Visit: Payer: Self-pay

## 2018-10-27 ENCOUNTER — Ambulatory Visit (INDEPENDENT_AMBULATORY_CARE_PROVIDER_SITE_OTHER): Payer: Managed Care, Other (non HMO) | Admitting: Family Medicine

## 2018-10-27 ENCOUNTER — Encounter (INDEPENDENT_AMBULATORY_CARE_PROVIDER_SITE_OTHER): Payer: Self-pay | Admitting: Family Medicine

## 2018-10-27 DIAGNOSIS — E669 Obesity, unspecified: Secondary | ICD-10-CM | POA: Diagnosis not present

## 2018-10-27 DIAGNOSIS — E559 Vitamin D deficiency, unspecified: Secondary | ICD-10-CM | POA: Diagnosis not present

## 2018-10-27 DIAGNOSIS — Z6834 Body mass index (BMI) 34.0-34.9, adult: Secondary | ICD-10-CM | POA: Diagnosis not present

## 2018-10-27 DIAGNOSIS — M722 Plantar fascial fibromatosis: Secondary | ICD-10-CM

## 2018-10-28 NOTE — Progress Notes (Signed)
Office: [phone]  /  Fax: [phone] TeleHealth Visit:  Donna Vasquez has verbally consented to this TeleHealth visit today. The patient is located at home, the provider is located at the [facility] office. The participants in this visit include the listed provider and patient and any and all parties involved. The visit was conducted today via FaceTime.  HPI:   Chief Complaint: OBESITY Donna Vasquez is here to discuss her progress with her obesity treatment plan. She is on the Category 2 plan and is following her eating plan approximately 60 % of the time. She states she is exercising 15 minutes 3 times per week. Tanvika feels like, in total overall quarantine, she has gained approximately 9 pounds. She has started walking more, and she noticed a re-emergence of plantar fasciitis. Dinner is the most difficult meal, secondary to her husband's tastes and he likes potatoes. Her husband does the grocery shoppingn and he often buys snacks that she craves. We were unable to weigh the patient today for this TeleHealth visit. She feels as if she has maintained weight since her last visit.   Vitamin D deficiency Donna Vasquez has a diagnosis of vitamin D deficiency. Donna Vasquez is currently taking vit D. She admits fatigue and denies nausea, vomiting or muscle weakness.  Plantar Fasciitis Donna Vasquez has a diagnosis of plantar fasciitis and she has had an increase in symptoms with an increase in walking. Patient has inserts and she is looking for them (currently can't find them).  ASSESSMENT AND PLAN:  Vitamin D deficiency  Plantar fasciitis  Class 1 obesity with serious comorbidity and body mass index (BMI) of 34.0 to 34.9 in adult, unspecified obesity type  PLAN:  Vitamin D Deficiency [name] was informed that low vitamin D levels contributes to fatigue and are associated with obesity, breast, and colon cancer. Donna Vasquez will continue to take prescription Vit D (no refill needed) and she  will follow up for routine testing of vitamin D, at least 2-3 times per year. She was informed of the risk of over-replacement of vitamin D and agrees to not increase her dose unless she discusses this with [name] first.  Plantar Fasciitis Stretching was encouraged today. Numerous brands of shoes were given to patient, as options to help with her symptoms. Sojourner agrees to follow up as directed.  Obesity Donna Vasquez is currently in the action stage of change. As such, her goal is to continue with weight loss efforts She has agreed to follow the Category 2 plan Donna Vasquez has been instructed to work up to a goal of 150 minutes of combined cardio and strengthening exercise per week for weight loss and overall health benefits. We discussed the following Behavioral Modification Strategies today: planning for success, keeping healthy foods in the home, better snacking choices, increasing lean protein intake, increasing vegetables and work on meal planning and easy cooking plans  Donna Vasquez has agreed to follow up with our clinic in 2 weeks. She was informed of the importance of frequent follow up visits to maximize her success with intensive lifestyle modifications for her multiple health conditions.  ALLERGIES: Allergies  Allergen Reactions  . Flonase [Fluticasone Propionate] Other (See Comments)    Nasal irritation, burning, but can tolerate advair inhaler.    . Sulfa Antibiotics Hives    MEDICATIONS: Current Outpatient Medications on File Prior to Visit  Medication Sig Dispense Refill  . albuterol (PROAIR HFA) 108 (90 Base) MCG/ACT inhaler Inhale 1-2 puffs into the lungs every 6 (six) hours as needed for  wheezing or shortness of breath. 1 Inhaler 0  . aspirin 81 MG tablet Take 81 mg by mouth daily.    [name] aspirin-acetaminophen-caffeine (EXCEDRIN MIGRAINE) 250-250-65 MG tablet Take by mouth every 6 (six) hours as needed for headache.    [name] buPROPion (WELLBUTRIN XL) 300 MG 24 hr tablet Take 1 tablet (300 mg  total) by mouth daily. 90 tablet 3  . dextromethorphan-guaiFENesin (MUCINEX DM) 30-600 MG 12hr tablet Take 1 tablet by mouth 2 (two) times daily as needed for cough.    . Fluticasone-Salmeterol (ADVAIR) 100-50 MCG/DOSE AEPB Inhale 1 puff into the lungs 2 (two) times daily.    . montelukast (SINGULAIR) 10 MG tablet TAKE 1 TABLET (10 MG TOTAL) BY MOUTH AT BEDTIME. 30 tablet 0  . niacin (NIASPAN) 1000 MG CR tablet TAKE 1 TABLET BY MOUTH AT  BEDTIME 90 tablet 2  . omeprazole (PRILOSEC) 20 MG capsule Take 1 capsule (20 mg total) by mouth daily. 30 capsule 2  . sertraline (ZOLOFT) 100 MG tablet TAKE 1 TABLET BY MOUTH  DAILY 90 tablet 2  . simvastatin (ZOCOR) 20 MG tablet TAKE 1 TABLET BY MOUTH  DAILY AT 6 PM. 90 tablet 2  . traZODone (DESYREL) 50 MG tablet Take 0.5-1 tablets (25-50 mg total) by mouth at bedtime as needed for sleep. 90 tablet 3  . triamcinolone cream (KENALOG) 0.1 % Apply 1 application topically 2 (two) times daily as needed. 45 g 1   No current facility-administered medications on file prior to visit.     PAST MEDICAL HISTORY: Past Medical History:  Diagnosis Date  . Allergy   . Asthma   . Back pain   . Bulging lumbar disc   . Chicken pox   . Constipation   . Depression   . Fatigue   . Frequent headaches   . Gall bladder disease   . Headache   . Heartburn   . Hyperlipidemia   . IBS (irritable bowel syndrome)   . Interstitial cystitis   . RA (rheumatoid arthritis) (HCC)   . Shortness of breath on exertion   . Trouble in sleeping   . Urine incontinence   . Vertigo   . Vision changes   . Vitamin D deficiency     PAST SURGICAL HISTORY: Past Surgical History:  Procedure Laterality Date  . CHOLECYSTECTOMY    . cyst removed from back    . NASAL SINUS SURGERY    . TUBAL LIGATION  1999    SOCIAL HISTORY: Social History   Tobacco Use  . Smoking status: Never Smoker  . Smokeless tobacco: Never Used  Substance Use Topics  . Alcohol use: Yes    Alcohol/week:  0.0 standard drinks    Comment: rare--wine  . Drug use: No    FAMILY HISTORY: Family History  Problem Relation Age of Onset  . COPD Mother   . Heart disease Mother   . Polymyalgia rheumatica Mother   . Emphysema Mother   . Stroke Mother   . Depression Mother   . Anxiety disorder Mother   . Heart disease Father   . Hypertension Father   . Hyperlipidemia Father   . Anxiety disorder Father   . Cancer Paternal Aunt        Breast  . Breast cancer Paternal Aunt 72  . Stroke Maternal Grandmother   . Cancer Maternal Aunt     ROS: Review of Systems  Constitutional: Positive for malaise/fatigue. Negative for weight loss.  Gastrointestinal: Negative for nausea and  vomiting.  Musculoskeletal:       Negative for muscle weakness Positive for foot pain     PHYSICAL EXAM: Pt in no acute distress  RECENT LABS AND TESTS: BMET    Component Value Date/Time   NA 144 09/09/2018 0932   K 4.0 09/09/2018 0932   CL 104 09/09/2018 0932   CO2 24 09/09/2018 0932   GLUCOSE 89 09/09/2018 0932   GLUCOSE 93 11/13/2015 1501   BUN 14 09/09/2018 0932   CREATININE 1.04 (H) 09/09/2018 0932   CALCIUM 9.3 09/09/2018 0932   GFRNONAA 61 09/09/2018 0932   GFRAA 71 09/09/2018 0932   Lab Results  Component Value Date   HGBA1C 5.1 09/09/2018   HGBA1C 5.0 04/02/2018   HGBA1C 5.1 11/17/2017   HGBA1C 5.0 11/13/2015   Lab Results  Component Value Date   INSULIN 17.3 09/09/2018   INSULIN 10.1 04/02/2018   INSULIN 8.7 11/17/2017   CBC    Component Value Date/Time   WBC 6.0 10/16/2017 1000   WBC 8.6 11/13/2015 1501   RBC 4.39 10/16/2017 1000   RBC 4.49 11/13/2015 1501   HGB 13.4 10/16/2017 1000   HCT 40.2 10/16/2017 1000   PLT 229 10/16/2017 1000   MCV 92 10/16/2017 1000   MCH 30.5 10/16/2017 1000   MCHC 33.3 10/16/2017 1000   MCHC 34.0 11/13/2015 1501   RDW 13.3 10/16/2017 1000   LYMPHSABS 1.4 10/16/2017 1000   EOSABS 0.2 10/16/2017 1000   BASOSABS 0.0 10/16/2017 1000    Iron/TIBC/Ferritin/ %Sat No results found for: IRON, TIBC, FERRITIN, IRONPCTSAT Lipid Panel     Component Value Date/Time   CHOL 153 09/09/2018 0932   TRIG 160 (H) 09/09/2018 0932   HDL 53 09/09/2018 0932   CHOLHDL 2.6 04/02/2018 0844   CHOLHDL 3 11/13/2015 1501   VLDL 29.8 11/13/2015 1501   LDLCALC 68 09/09/2018 0932   Hepatic Function Panel     Component Value Date/Time   PROT 6.3 09/09/2018 0932   ALBUMIN 4.3 09/09/2018 0932   AST 21 09/09/2018 0932   ALT 19 09/09/2018 0932   ALKPHOS 102 09/09/2018 0932   BILITOT 0.3 09/09/2018 0932      Component Value Date/Time   TSH 1.990 11/17/2017 1158   TSH 2.280 10/16/2017 1000   TSH 2.12 11/13/2015 1501     Ref. Range 09/09/2018 09:32  Vitamin D, 25-Hydroxy Latest Ref Range: 30.0 - 100.0 ng/mL 31.5    I, Doreene Nest, am acting as Location manager for Eber Jones, MD  I have reviewed the above documentation for accuracy and completeness, and I agree with the above. - Ilene Qua, MD

## 2018-11-08 ENCOUNTER — Other Ambulatory Visit: Payer: Self-pay | Admitting: Family Medicine

## 2018-11-10 ENCOUNTER — Telehealth (INDEPENDENT_AMBULATORY_CARE_PROVIDER_SITE_OTHER): Payer: Managed Care, Other (non HMO) | Admitting: Family Medicine

## 2018-11-10 ENCOUNTER — Other Ambulatory Visit: Payer: Self-pay

## 2018-11-10 DIAGNOSIS — E669 Obesity, unspecified: Secondary | ICD-10-CM | POA: Diagnosis not present

## 2018-11-10 DIAGNOSIS — E8881 Metabolic syndrome: Secondary | ICD-10-CM

## 2018-11-10 DIAGNOSIS — E7849 Other hyperlipidemia: Secondary | ICD-10-CM

## 2018-11-10 DIAGNOSIS — Z6834 Body mass index (BMI) 34.0-34.9, adult: Secondary | ICD-10-CM

## 2018-11-11 ENCOUNTER — Encounter (INDEPENDENT_AMBULATORY_CARE_PROVIDER_SITE_OTHER): Payer: Self-pay | Admitting: Family Medicine

## 2018-11-11 NOTE — Progress Notes (Signed)
Office: [phone]  /  Fax: [phone] TeleHealth Visit:  Donna Vasquez has verbally consented to this TeleHealth visit today. The patient is located at home, the provider is located at the [facility] office. The participants in this visit include the listed provider and patient. The visit was conducted today via FaceTime.  HPI:   Chief Complaint: OBESITY Donna Vasquez is here to discuss her progress with her obesity treatment plan. She is on the Category 2 plan and is following her eating plan approximately 50% of the time. She states she is exercising 0 minutes 0 times per week. Donna Vasquez feels she has gained weight. She is craving a significant amount of sugar and is struggling with eating while at home. She is doing yogurt and a protein bar for breakfast, then doing Special K cereal, and then an apple with chocolate dip.  We were unable to weigh the patient today for this TeleHealth visit. She feels as if she has gained weight since her last visit. She has lost 18 lbs since starting treatment with [name].  Hyperlipidemia Donna Vasquez has hyperlipidemia and has been trying to improve her cholesterol levels with intensive lifestyle modification including a low saturated fat diet, exercise and weight loss. She is on Zocor and denies any myalgias.  Insulin Resistance Donna Vasquez has a diagnosis of insulin resistance based on her elevated fasting insulin level >5. Although Donna Vasquez's blood glucose readings are still under good control, insulin resistance puts her at greater risk of metabolic syndrome and diabetes. She is not taking metformin currently and continues to work on diet and exercise to decrease risk of diabetes. She does report carb cravings.  ASSESSMENT AND PLAN:  No diagnosis found.  PLAN:  Hyperlipidemia Julia was informed of the American Heart Association Guidelines emphasizing intensive lifestyle modifications as the first line treatment for hyperlipidemia. We discussed  many lifestyle modifications today in depth, and Donna Vasquez will continue to work on decreasing saturated fats such as fatty red meat, butter and many fried foods. She will continue taking Zocor, increase vegetables and lean protein in her diet, and continue to work on exercise and weight loss efforts.  Insulin Resistance Donna Vasquez will continue to work on weight loss, exercise, and decreasing simple carbohydrates in her diet to help decrease the risk of diabetes. We dicussed metformin including benefits and risks. She was informed that eating too many simple carbohydrates or too many calories at one sitting increases the likelihood of GI side effects. Donna Vasquez will follow-up with [name] in 2 weeks and will have repeat labs in early August.   Obesity Donna Vasquez is currently in the action stage of change. As such, her goal is to continue with weight loss efforts. She has agreed to start keeping a food journal with 1200-1300 calories and 85+ grams of protein daily. Donna Vasquez has been instructed to work up to a goal of 150 minutes of combined cardio and strengthening exercise per week for weight loss and overall health benefits. We discussed the following Behavioral Modification Strategies today: increasing lean protein intake, increasing vegetables, work on meal planning and easy cooking plans, keeping heathy foods in the home, and planning for success.  Donna Vasquez has agreed to follow-up with our clinic in 2 weeks. She was informed of the importance of frequent follow-up visits to maximize her success with intensive lifestyle modifications for her multiple health conditions.  ALLERGIES: Allergies  Allergen Reactions  . Flonase [Fluticasone Propionate] Other (See Comments)    Nasal irritation, burning, but can tolerate advair  inhaler.    . Sulfa Antibiotics Hives    MEDICATIONS: Current Outpatient Medications on File Prior to Visit  Medication Sig Dispense Refill  . albuterol (PROAIR HFA) 108 (90 Base) MCG/ACT  inhaler Inhale 1-2 puffs into the lungs every 6 (six) hours as needed for wheezing or shortness of breath. 1 Inhaler 0  . aspirin 81 MG tablet Take 81 mg by mouth daily.    [name] aspirin-acetaminophen-caffeine (EXCEDRIN MIGRAINE) 250-250-65 MG tablet Take by mouth every 6 (six) hours as needed for headache.    [name] buPROPion (WELLBUTRIN XL) 300 MG 24 hr tablet TAKE 1 TABLET BY MOUTH  DAILY 90 tablet 1  . dextromethorphan-guaiFENesin (MUCINEX DM) 30-600 MG 12hr tablet Take 1 tablet by mouth 2 (two) times daily as needed for cough.    . Fluticasone-Salmeterol (ADVAIR) 100-50 MCG/DOSE AEPB Inhale 1 puff into the lungs 2 (two) times daily.    . montelukast (SINGULAIR) 10 MG tablet TAKE 1 TABLET (10 MG TOTAL) BY MOUTH AT BEDTIME. 30 tablet 0  . niacin (NIASPAN) 1000 MG CR tablet TAKE 1 TABLET BY MOUTH AT  BEDTIME 90 tablet 2  . omeprazole (PRILOSEC) 20 MG capsule Take 1 capsule (20 mg total) by mouth daily. 30 capsule 2  . sertraline (ZOLOFT) 100 MG tablet TAKE 1 TABLET BY MOUTH  DAILY 90 tablet 2  . simvastatin (ZOCOR) 20 MG tablet TAKE 1 TABLET BY MOUTH  DAILY AT 6 PM. 90 tablet 2  . traZODone (DESYREL) 50 MG tablet Take 0.5-1 tablets (25-50 mg total) by mouth at bedtime as needed for sleep. 90 tablet 3  . triamcinolone cream (KENALOG) 0.1 % Apply 1 application topically 2 (two) times daily as needed. 45 g 1   No current facility-administered medications on file prior to visit.     PAST MEDICAL HISTORY: Past Medical History:  Diagnosis Date  . Allergy   . Asthma   . Back pain   . Bulging lumbar disc   . Chicken pox   . Constipation   . Depression   . Fatigue   . Frequent headaches   . Gall bladder disease   . Headache   . Heartburn   . Hyperlipidemia   . IBS (irritable bowel syndrome)   . Interstitial cystitis   . RA (rheumatoid arthritis) (HCC)   . Shortness of breath on exertion   . Trouble in sleeping   . Urine incontinence   . Vertigo   . Vision changes   . Vitamin D deficiency      PAST SURGICAL HISTORY: Past Surgical History:  Procedure Laterality Date  . CHOLECYSTECTOMY    . cyst removed from back    . NASAL SINUS SURGERY    . TUBAL LIGATION  1999    SOCIAL HISTORY: Social History   Tobacco Use  . Smoking status: Never Smoker  . Smokeless tobacco: Never Used  Substance Use Topics  . Alcohol use: Yes    Alcohol/week: 0.0 standard drinks    Comment: rare--wine  . Drug use: No    FAMILY HISTORY: Family History  Problem Relation Age of Onset  . COPD Mother   . Heart disease Mother   . Polymyalgia rheumatica Mother   . Emphysema Mother   . Stroke Mother   . Depression Mother   . Anxiety disorder Mother   . Heart disease Father   . Hypertension Father   . Hyperlipidemia Father   . Anxiety disorder Father   . Cancer Paternal Aunt  Breast  . Breast cancer Paternal Aunt 50  . Stroke Maternal Grandmother   . Cancer Maternal Aunt    ROS: Review of Systems  Musculoskeletal: Negative for myalgias.   PHYSICAL EXAM: Pt in no acute distress  RECENT LABS AND TESTS: BMET    Component Value Date/Time   NA 144 09/09/2018 0932   K 4.0 09/09/2018 0932   CL 104 09/09/2018 0932   CO2 24 09/09/2018 0932   GLUCOSE 89 09/09/2018 0932   GLUCOSE 93 11/13/2015 1501   BUN 14 09/09/2018 0932   CREATININE 1.04 (H) 09/09/2018 0932   CALCIUM 9.3 09/09/2018 0932   GFRNONAA 61 09/09/2018 0932   GFRAA 71 09/09/2018 0932   Lab Results  Component Value Date   HGBA1C 5.1 09/09/2018   HGBA1C 5.0 04/02/2018   HGBA1C 5.1 11/17/2017   HGBA1C 5.0 11/13/2015   Lab Results  Component Value Date   INSULIN 17.3 09/09/2018   INSULIN 10.1 04/02/2018   INSULIN 8.7 11/17/2017   CBC    Component Value Date/Time   WBC 6.0 10/16/2017 1000   WBC 8.6 11/13/2015 1501   RBC 4.39 10/16/2017 1000   RBC 4.49 11/13/2015 1501   HGB 13.4 10/16/2017 1000   HCT 40.2 10/16/2017 1000   PLT 229 10/16/2017 1000   MCV 92 10/16/2017 1000   MCH 30.5 10/16/2017 1000    MCHC 33.3 10/16/2017 1000   MCHC 34.0 11/13/2015 1501   RDW 13.3 10/16/2017 1000   LYMPHSABS 1.4 10/16/2017 1000   EOSABS 0.2 10/16/2017 1000   BASOSABS 0.0 10/16/2017 1000   Iron/TIBC/Ferritin/ %Sat No results found for: IRON, TIBC, FERRITIN, IRONPCTSAT Lipid Panel     Component Value Date/Time   CHOL 153 09/09/2018 0932   TRIG 160 (H) 09/09/2018 0932   HDL 53 09/09/2018 0932   CHOLHDL 2.6 04/02/2018 0844   CHOLHDL 3 11/13/2015 1501   VLDL 29.8 11/13/2015 1501   LDLCALC 68 09/09/2018 0932   Hepatic Function Panel     Component Value Date/Time   PROT 6.3 09/09/2018 0932   ALBUMIN 4.3 09/09/2018 0932   AST 21 09/09/2018 0932   ALT 19 09/09/2018 0932   ALKPHOS 102 09/09/2018 0932   BILITOT 0.3 09/09/2018 0932      Component Value Date/Time   TSH 1.990 11/17/2017 1158   TSH 2.280 10/16/2017 1000   TSH 2.12 11/13/2015 1501   Results for [facility], Meiya R "KATIE" (MRN [id number]) as of 11/11/2018 12:15  Ref. Range 09/09/2018 09:32  Vitamin D, 25-Hydroxy Latest Ref Range: 30.0 - 100.0 ng/mL 31.5    I, Michaelene Song, am acting as Location manager for Ilene Qua, MD  I have reviewed the above documentation for accuracy and completeness, and I agree with the above. - Ilene Qua, MD

## 2018-11-13 ENCOUNTER — Encounter (INDEPENDENT_AMBULATORY_CARE_PROVIDER_SITE_OTHER): Payer: Self-pay | Admitting: Family Medicine

## 2018-11-17 ENCOUNTER — Encounter (INDEPENDENT_AMBULATORY_CARE_PROVIDER_SITE_OTHER): Payer: Self-pay

## 2018-11-17 NOTE — Telephone Encounter (Signed)
Should I send the journaling sheet?

## 2018-11-17 NOTE — Telephone Encounter (Signed)
Please follow up

## 2018-11-17 NOTE — Telephone Encounter (Signed)
Please send journaling sheet, eating out, recipes and snack sheet. Thanks

## 2018-11-24 ENCOUNTER — Encounter (INDEPENDENT_AMBULATORY_CARE_PROVIDER_SITE_OTHER): Payer: Self-pay

## 2018-11-25 ENCOUNTER — Other Ambulatory Visit: Payer: Self-pay

## 2018-11-25 ENCOUNTER — Ambulatory Visit (INDEPENDENT_AMBULATORY_CARE_PROVIDER_SITE_OTHER): Payer: Managed Care, Other (non HMO) | Admitting: Family Medicine

## 2018-11-25 ENCOUNTER — Encounter (INDEPENDENT_AMBULATORY_CARE_PROVIDER_SITE_OTHER): Payer: Self-pay | Admitting: Family Medicine

## 2018-11-25 VITALS — BP 152/73 | HR 70 | Temp 97.9°F | Ht 68.0 in | Wt 249.0 lb

## 2018-11-25 DIAGNOSIS — Z6837 Body mass index (BMI) 37.0-37.9, adult: Secondary | ICD-10-CM

## 2018-11-25 DIAGNOSIS — Z9189 Other specified personal risk factors, not elsewhere classified: Secondary | ICD-10-CM

## 2018-11-25 DIAGNOSIS — E8881 Metabolic syndrome: Secondary | ICD-10-CM | POA: Diagnosis not present

## 2018-11-25 DIAGNOSIS — E559 Vitamin D deficiency, unspecified: Secondary | ICD-10-CM | POA: Diagnosis not present

## 2018-11-25 MED ORDER — VITAMIN D (ERGOCALCIFEROL) 1.25 MG (50000 UNIT) PO CAPS: 50000.0000 [IU] | ORAL_CAPSULE | ORAL | 0 refills | Status: DC

## 2018-11-26 NOTE — Progress Notes (Signed)
Office: [phone]  /  Fax: [phone]   HPI:   Chief Complaint: OBESITY Donna Vasquez is here to discuss her progress with her obesity treatment plan. She is on the keep a food journal with 1200-1300 calories and 85+ grams of protein daily and is following her eating plan approximately 50 % of the time. She states she is exercising 0 minutes 0 times per week. [employer] went out for 3 days over the July 4th weekend. She is disappointed with weight gain during the pandemic. She is looking to move food around to get more in at breakfast, as that is when she is most hungry.  Her weight is 249 lb (112.9 kg) today and has gained 19 lbs since her last visit. She has lost 0 lbs since starting treatment with [name].  Vitamin D Deficiency Donna Vasquez has a diagnosis of vitamin D deficiency. She is currently taking prescription Vit D. She notes fatigue and denies nausea, vomiting or muscle weakness.  At risk for osteopenia and osteoporosis Donna Vasquez is at higher risk of osteopenia and osteoporosis due to vitamin D deficiency.   Insulin Resistance Donna Vasquez has a diagnosis of insulin resistance based on her elevated fasting insulin level >5. Last Hgb A1c was of 5.1 and insulin of 17.3. Although Donna Vasquez's blood glucose readings are still under good control, insulin resistance puts her at greater risk of metabolic syndrome and diabetes. She is not taking metformin currently and continues to work on diet and exercise to decrease risk of diabetes.  ASSESSMENT AND PLAN:  At risk for osteoporosis  Vitamin D deficiency  Insulin resistance  Class 2 severe obesity with serious comorbidity and body mass index (BMI) of 37.0 to 37.9 in adult, unspecified obesity type ([place])  PLAN:  Vitamin D Deficiency Donna Vasquez was informed that low vitamin D levels contributes to fatigue and are associated with obesity, breast, and colon cancer. Donna Vasquez agrees to continue taking prescription Vit D 50,000 IU every week  #4 and we will refill for 1 month. She will follow up for routine testing of vitamin D, at least 2-3 times per year. She was informed of the risk of over-replacement of vitamin D and agrees to not increase her dose unless she discusses this with [name] first. Donna Vasquez agrees to follow up with our clinic in 2 weeks.  At risk for osteopenia and osteoporosis Donna Vasquez was given extended (15 minutes) osteoporosis prevention counseling today. Donna Vasquez is at risk for osteopenia and osteoporsis due to her vitamin D deficiency. She was encouraged to take her vitamin D and follow her higher calcium diet and increase strengthening exercise to help strengthen her bones and decrease her risk of osteopenia and osteoporosis.  Insulin Resistance Donna Vasquez will continue to work on weight loss, exercise, and decreasing simple carbohydrates in her diet to help decrease the risk of diabetes. We dicussed metformin including benefits and risks. She was informed that eating too many simple carbohydrates or too many calories at one sitting increases the likelihood of GI side effects. Donna Vasquez declined metformin for now and prescription was not written today. We will repeat labs in early August. Donna Vasquez agrees to follow up with our clinic in 2 weeks as directed to monitor her progress.  Obesity Donna Vasquez is currently in the action stage of change. As such, her goal is to continue with weight loss efforts She has agreed to follow the Category 2 plan Donna Vasquez has been instructed to work up to a goal of 150 minutes of combined cardio and strengthening exercise  per week for weight loss and overall health benefits. We discussed the following Behavioral Modification Strategies today: increasing lean protein intake, increasing vegetables and work on meal planning and easy cooking plans, keeping healthy foods in the home, and planning for success    Donna Vasquez has agreed to follow up with our clinic in 2 weeks. She was informed of the importance  of frequent follow up visits to maximize her success with intensive lifestyle modifications for her multiple health conditions.  ALLERGIES: Allergies  Allergen Reactions   Flonase [Fluticasone Propionate] Other (See Comments)    Nasal irritation, burning, but can tolerate advair inhaler.     Sulfa Antibiotics Hives    MEDICATIONS: Current Outpatient Medications on File Prior to Visit  Medication Sig Dispense Refill   albuterol (PROAIR HFA) 108 (90 Base) MCG/ACT inhaler Inhale 1-2 puffs into the lungs every 6 (six) hours as needed for wheezing or shortness of breath. 1 Inhaler 0   aspirin 81 MG tablet Take 81 mg by mouth daily.     aspirin-acetaminophen-caffeine (EXCEDRIN MIGRAINE) 250-250-65 MG tablet Take by mouth every 6 (six) hours as needed for headache.     buPROPion (WELLBUTRIN XL) 300 MG 24 hr tablet TAKE 1 TABLET BY MOUTH  DAILY 90 tablet 1   Fluticasone-Salmeterol (ADVAIR) 100-50 MCG/DOSE AEPB Inhale 1 puff into the lungs 2 (two) times daily.     montelukast (SINGULAIR) 10 MG tablet TAKE 1 TABLET (10 MG TOTAL) BY MOUTH AT BEDTIME. 30 tablet 0   niacin (NIASPAN) 1000 MG CR tablet TAKE 1 TABLET BY MOUTH AT  BEDTIME 90 tablet 2   omeprazole (PRILOSEC) 20 MG capsule Take 1 capsule (20 mg total) by mouth daily. 30 capsule 2   sertraline (ZOLOFT) 100 MG tablet TAKE 1 TABLET BY MOUTH  DAILY 90 tablet 2   simvastatin (ZOCOR) 20 MG tablet TAKE 1 TABLET BY MOUTH  DAILY AT 6 PM. 90 tablet 2   traZODone (DESYREL) 50 MG tablet Take 0.5-1 tablets (25-50 mg total) by mouth at bedtime as needed for sleep. 90 tablet 3   dextromethorphan-guaiFENesin (MUCINEX DM) 30-600 MG 12hr tablet Take 1 tablet by mouth 2 (two) times daily as needed for cough.     triamcinolone cream (KENALOG) 0.1 % Apply 1 application topically 2 (two) times daily as needed. (Patient not taking: Reported on 11/25/2018) 45 g 1   No current facility-administered medications on file prior to visit.     PAST MEDICAL  HISTORY: Past Medical History:  Diagnosis Date   Allergy    Asthma    Back pain    Bulging lumbar disc    Chicken pox    Constipation    Depression    Fatigue    Frequent headaches    Gall bladder disease    Headache    Heartburn    Hyperlipidemia    IBS (irritable bowel syndrome)    Interstitial cystitis    RA (rheumatoid arthritis) (HCC)    Shortness of breath on exertion    Trouble in sleeping    Urine incontinence    Vertigo    Vision changes    Vitamin D deficiency     PAST SURGICAL HISTORY: Past Surgical History:  Procedure Laterality Date   CHOLECYSTECTOMY     cyst removed from back     NASAL SINUS SURGERY     TUBAL LIGATION  1999    SOCIAL HISTORY: Social History   Tobacco Use   Smoking status: Never Smoker  Smokeless tobacco: Never Used  Substance Use Topics   Alcohol use: Yes    Alcohol/week: 0.0 standard drinks    Comment: rare--wine   Drug use: No    FAMILY HISTORY: Family History  Problem Relation Age of Onset   COPD Mother    Heart disease Mother    Polymyalgia rheumatica Mother    Emphysema Mother    Stroke Mother    Depression Mother    Anxiety disorder Mother    Heart disease Father    Hypertension Father    Hyperlipidemia Father    Anxiety disorder Father    Cancer Paternal Aunt        Breast   Breast cancer Paternal Aunt 83   Stroke Maternal Grandmother    Cancer Maternal Aunt     ROS: Review of Systems  Constitutional: Positive for malaise/fatigue. Negative for weight loss.  Gastrointestinal: Negative for nausea and vomiting.  Musculoskeletal:       Negative muscle weakness    PHYSICAL EXAM: Blood pressure (!) 152/73, pulse 70, temperature 97.9 F (36.6 C), temperature source Oral, height 5\' 8"  (1.727 m), weight 249 lb (112.9 kg), SpO2 95 %. Body mass index is 37.86 kg/m. Physical Exam Vitals signs reviewed.  Constitutional:      Appearance: Normal appearance. She is  obese.  Cardiovascular:     Rate and Rhythm: Normal rate.     Pulses: Normal pulses.  Pulmonary:     Effort: Pulmonary effort is normal.     Breath sounds: Normal breath sounds.  Musculoskeletal: Normal range of motion.  Skin:    General: Skin is warm and dry.  Neurological:     Mental Status: She is alert and oriented to person, place, and time.  Psychiatric:        Mood and Affect: Mood normal.        Behavior: Behavior normal.     RECENT LABS AND TESTS: BMET    Component Value Date/Time   NA 144 09/09/2018 0932   K 4.0 09/09/2018 0932   CL 104 09/09/2018 0932   CO2 24 09/09/2018 0932   GLUCOSE 89 09/09/2018 0932   GLUCOSE 93 11/13/2015 1501   BUN 14 09/09/2018 0932   CREATININE 1.04 (H) 09/09/2018 0932   CALCIUM 9.3 09/09/2018 0932   GFRNONAA 61 09/09/2018 0932   GFRAA 71 09/09/2018 0932   Lab Results  Component Value Date   HGBA1C 5.1 09/09/2018   HGBA1C 5.0 04/02/2018   HGBA1C 5.1 11/17/2017   HGBA1C 5.0 11/13/2015   Lab Results  Component Value Date   INSULIN 17.3 09/09/2018   INSULIN 10.1 04/02/2018   INSULIN 8.7 11/17/2017   CBC    Component Value Date/Time   WBC 6.0 10/16/2017 1000   WBC 8.6 11/13/2015 1501   RBC 4.39 10/16/2017 1000   RBC 4.49 11/13/2015 1501   HGB 13.4 10/16/2017 1000   HCT 40.2 10/16/2017 1000   PLT 229 10/16/2017 1000   MCV 92 10/16/2017 1000   MCH 30.5 10/16/2017 1000   MCHC 33.3 10/16/2017 1000   MCHC 34.0 11/13/2015 1501   RDW 13.3 10/16/2017 1000   LYMPHSABS 1.4 10/16/2017 1000   EOSABS 0.2 10/16/2017 1000   BASOSABS 0.0 10/16/2017 1000   Iron/TIBC/Ferritin/ %Sat No results found for: IRON, TIBC, FERRITIN, IRONPCTSAT Lipid Panel     Component Value Date/Time   CHOL 153 09/09/2018 0932   TRIG 160 (H) 09/09/2018 0932   HDL 53 09/09/2018 0932   CHOLHDL 2.6 04/02/2018  0844   CHOLHDL 3 11/13/2015 1501   VLDL 29.8 11/13/2015 1501   LDLCALC 68 09/09/2018 0932   Hepatic Function Panel     Component Value  Date/Time   PROT 6.3 09/09/2018 0932   ALBUMIN 4.3 09/09/2018 0932   AST 21 09/09/2018 0932   ALT 19 09/09/2018 0932   ALKPHOS 102 09/09/2018 0932   BILITOT 0.3 09/09/2018 0932      Component Value Date/Time   TSH 1.990 11/17/2017 1158   TSH 2.280 10/16/2017 1000   TSH 2.12 11/13/2015 1501      OBESITY BEHAVIORAL INTERVENTION VISIT  Today's visit was # 22   Starting weight: 248 lbs Starting date: 11/17/17 Today's weight : 249 lbs Today's date: 11/25/2018 Total lbs lost to date: 0    ASK: We discussed the diagnosis of obesity with Donna Vasquez today and Donna Vasquez agreed to give us permission to discuss obesity behavioral modification therapy today.  ASSESS: Donna Vasquez has the diagnosis of obesity and her BMI today is 37.87 Donna Vasquez is in the action stage of change   ADVISE: Donna Vasquez was educated on the multiple health risks of obesity as well as the benefit of weight loss to improve her health. She was advised of the need for long term treatment and the importance of lifestyle modifications to improve her current health and to decrease her risk of future health problems.  AGREE: Multiple dietary modification options and treatment options were discussed and  Donna Vasquez agreed to follow the recommendations documented in the above note.  ARRANGE: Donna Vasquez was educated on the importance of frequent visits to treat obesity as outlined per CMS and USPSTF guidelines and agreed to schedule her next follow up appointment today.  I, Burt KnackSharon Martin, am acting as transcriptionist for Debbra RidingAlexandria Kadolph, MD  I have reviewed the above documentation for accuracy and completeness, and I agree with the above. - Debbra RidingAlexandria Kadolph, MD

## 2018-12-14 ENCOUNTER — Encounter (INDEPENDENT_AMBULATORY_CARE_PROVIDER_SITE_OTHER): Payer: Self-pay | Admitting: Family Medicine

## 2018-12-14 ENCOUNTER — Ambulatory Visit (INDEPENDENT_AMBULATORY_CARE_PROVIDER_SITE_OTHER): Payer: Managed Care, Other (non HMO) | Admitting: Family Medicine

## 2018-12-14 ENCOUNTER — Other Ambulatory Visit: Payer: Self-pay

## 2018-12-14 VITALS — BP 119/74 | HR 77 | Temp 98.4°F | Ht 68.0 in | Wt 247.0 lb

## 2018-12-14 DIAGNOSIS — E8881 Metabolic syndrome: Secondary | ICD-10-CM | POA: Diagnosis not present

## 2018-12-14 DIAGNOSIS — Z6837 Body mass index (BMI) 37.0-37.9, adult: Secondary | ICD-10-CM | POA: Diagnosis not present

## 2018-12-14 DIAGNOSIS — E781 Pure hyperglyceridemia: Secondary | ICD-10-CM | POA: Diagnosis not present

## 2018-12-14 NOTE — Progress Notes (Signed)
Office: [phone]  /  Fax: [phone]   HPI:   Chief Complaint: OBESITY Donna Vasquez is here to discuss her progress with her obesity treatment plan. She is on the Category 2 plan and is following her eating plan approximately 80 % of the time. She states she is exercising 0 minutes 0 times per week. Donna Vasquez voices the last few weeks she stuck more to the meal plan. 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 She was told to expect to stay working from home until January.  Her weight is 247 lb (112 kg) today and has had a weight loss of 2 pounds over a period of 2 to 3 weeks since her last visit. She has lost 1 lb since starting treatment with us.  Insulin Resistance Donna Vasquez has a diagnosis of insulin resistance based on her elevated fasting insulin level >5. Although Donna Vasquez's blood glucose readings are still under good control, insulin resistance puts her at greater risk of metabolic syndrome and diabetes. She is not taking metformin currently and notes occasional carbohydrate cravings. She continues to work on diet and exercise to decrease risk of diabetes.  Hypertriglyceridemia Donna Vasquez's last triglycerides was 168. She is on a statin for LDL.  ASSESSMENT AND PLAN:  Insulin resistance  Hypertriglyceridemia  Class 2 severe obesity with serious comorbidity and body mass index (BMI) of 37.0 to 37.9 in adult, unspecified obesity type (HCC)  PLAN:  Insulin Resistance Donna Vasquez will continue to work on weight loss, exercise, and decreasing simple carbohydrates in her diet to help decrease the risk of diabetes. We dicussed metformin including benefits and risks. She was informed that eating too many simple carbohydrates or too many calories at one sitting increases the likelihood of GI side effects. Donna Vasquez declined metformin for now and prescription was not written today. We will repeat at her next visit. Donna Vasquez agrees to follow up with our clinic in 2 weeks as directed to  monitor her progress.  Hypertriglyceridemia We will repeat at her next visit. Donna Vasquez agrees to follow up with our clinic in 2 weeks.  Obesity Donna Vasquez is currently in the action stage of change. As such, her goal is to continue with weight loss efforts She has agreed to follow the Category 2 plan Donna Vasquez has been instructed to work up to a goal of 150 minutes of combined cardio and strengthening exercise per week or exercise for 10-30 minutes 2-3 times per week, she would like to do body groove videos for weight loss and overall health benefits. We discussed the following Behavioral Modification Strategies today: increasing lean protein intake, increasing vegetables and work on meal planning and easy cooking plans, keeping healthy foods in the home, better snacking choices, and planning for success   Donna Vasquez has agreed to follow up with our clinic in 2 weeks. She was informed of the importance of frequent follow up visits to maximize her success with intensive lifestyle modifications for her multiple health conditions.  ALLERGIES: Allergies  Allergen Reactions  . Flonase [Fluticasone Propionate] Other (See Comments)    Nasal irritation, burning, but can tolerate advair inhaler.    . Sulfa Antibiotics Hives    MEDICATIONS: Current Outpatient Medications on File Prior to Visit  Medication Sig Dispense Refill  . albuterol (PROAIR HFA) 108 (90 Base) MCG/ACT inhaler Inhale 1-2 puffs into the lungs every 6 (six) hours as needed for wheezing or shortness of breath. 1 Inhaler 0  . aspirin 81 MG tablet Take 81 mg by mouth daily.    .[name]  aspirin-acetaminophen-caffeine (EXCEDRIN MIGRAINE) 250-250-65 MG tablet Take by mouth every 6 (six) hours as needed for headache.    [name] buPROPion (WELLBUTRIN XL) 300 MG 24 hr tablet TAKE 1 TABLET BY MOUTH  DAILY 90 tablet 1  . dextromethorphan-guaiFENesin (MUCINEX DM) 30-600 MG 12hr tablet Take 1 tablet by mouth 2 (two) times daily as needed for cough.    .  Fluticasone-Salmeterol (ADVAIR) 100-50 MCG/DOSE AEPB Inhale 1 puff into the lungs 2 (two) times daily.    . montelukast (SINGULAIR) 10 MG tablet TAKE 1 TABLET (10 MG TOTAL) BY MOUTH AT BEDTIME. 30 tablet 0  . niacin (NIASPAN) 1000 MG CR tablet TAKE 1 TABLET BY MOUTH AT  BEDTIME 90 tablet 2  . omeprazole (PRILOSEC) 20 MG capsule Take 1 capsule (20 mg total) by mouth daily. 30 capsule 2  . sertraline (ZOLOFT) 100 MG tablet TAKE 1 TABLET BY MOUTH  DAILY 90 tablet 2  . simvastatin (ZOCOR) 20 MG tablet TAKE 1 TABLET BY MOUTH  DAILY AT 6 PM. 90 tablet 2  . traZODone (DESYREL) 50 MG tablet Take 0.5-1 tablets (25-50 mg total) by mouth at bedtime as needed for sleep. 90 tablet 3  . triamcinolone cream (KENALOG) 0.1 % Apply 1 application topically 2 (two) times daily as needed. (Patient not taking: Reported on 11/25/2018) 45 g 1  . Vitamin D, Ergocalciferol, (DRISDOL) 1.25 MG (50000 UT) CAPS capsule Take 1 capsule (50,000 Units total) by mouth every 7 (seven) days. 4 capsule 0   No current facility-administered medications on file prior to visit.     PAST MEDICAL HISTORY: Past Medical History:  Diagnosis Date  . Allergy   . Asthma   . Back pain   . Bulging lumbar disc   . Chicken pox   . Constipation   . Depression   . Fatigue   . Frequent headaches   . Gall bladder disease   . Headache   . Heartburn   . Hyperlipidemia   . IBS (irritable bowel syndrome)   . Interstitial cystitis   . RA (rheumatoid arthritis) ([place])   . Shortness of breath on exertion   . Trouble in sleeping   . Urine incontinence   . Vertigo   . Vision changes   . Vitamin D deficiency     PAST SURGICAL HISTORY: Past Surgical History:  Procedure Laterality Date  . CHOLECYSTECTOMY    . cyst removed from back    . NASAL SINUS SURGERY    . TUBAL LIGATION  1999    SOCIAL HISTORY: Social History   Tobacco Use  . Smoking status: Never Smoker  . Smokeless tobacco: Never Used  Substance Use Topics  . Alcohol use:  Yes    Alcohol/week: 0.0 standard drinks    Comment: rare--wine  . Drug use: No    FAMILY HISTORY: Family History  Problem Relation Age of Onset  . COPD Mother   . Heart disease Mother   . Polymyalgia rheumatica Mother   . Emphysema Mother   . Stroke Mother   . Depression Mother   . Anxiety disorder Mother   . Heart disease Father   . Hypertension Father   . Hyperlipidemia Father   . Anxiety disorder Father   . Cancer Paternal Aunt        Breast  . Breast cancer Paternal Aunt 80  . Stroke Maternal Grandmother   . Cancer Maternal Aunt     ROS: Review of Systems  Constitutional: Positive for weight loss.  PHYSICAL EXAM: Blood pressure 119/74, pulse 77, temperature 98.4 F (36.9 C), temperature source Oral, height 5\' 8"  (1.727 m), weight 247 lb (112 kg), SpO2 95 %. Body mass index is 37.56 kg/m. Physical Exam Vitals signs reviewed.  Constitutional:      Appearance: Normal appearance. She is obese.  Cardiovascular:     Rate and Rhythm: Normal rate.     Pulses: Normal pulses.  Pulmonary:     Effort: Pulmonary effort is normal.     Breath sounds: Normal breath sounds.  Musculoskeletal: Normal range of motion.  Skin:    General: Skin is warm and dry.  Neurological:     Mental Status: She is alert and oriented to person, place, and time.  Psychiatric:        Mood and Affect: Mood normal.        Behavior: Behavior normal.     RECENT LABS AND TESTS: BMET    Component Value Date/Time   NA 144 09/09/2018 0932   K 4.0 09/09/2018 0932   CL 104 09/09/2018 0932   CO2 24 09/09/2018 0932   GLUCOSE 89 09/09/2018 0932   GLUCOSE 93 11/13/2015 1501   BUN 14 09/09/2018 0932   CREATININE 1.04 (H) 09/09/2018 0932   CALCIUM 9.3 09/09/2018 0932   GFRNONAA 61 09/09/2018 0932   GFRAA 71 09/09/2018 0932   Lab Results  Component Value Date   HGBA1C 5.1 09/09/2018   HGBA1C 5.0 04/02/2018   HGBA1C 5.1 11/17/2017   HGBA1C 5.0 11/13/2015   Lab Results  Component Value  Date   INSULIN 17.3 09/09/2018   INSULIN 10.1 04/02/2018   INSULIN 8.7 11/17/2017   CBC    Component Value Date/Time   WBC 6.0 10/16/2017 1000   WBC 8.6 11/13/2015 1501   RBC 4.39 10/16/2017 1000   RBC 4.49 11/13/2015 1501   HGB 13.4 10/16/2017 1000   HCT 40.2 10/16/2017 1000   PLT 229 10/16/2017 1000   MCV 92 10/16/2017 1000   MCH 30.5 10/16/2017 1000   MCHC 33.3 10/16/2017 1000   MCHC 34.0 11/13/2015 1501   RDW 13.3 10/16/2017 1000   LYMPHSABS 1.4 10/16/2017 1000   EOSABS 0.2 10/16/2017 1000   BASOSABS 0.0 10/16/2017 1000   Iron/TIBC/Ferritin/ %Sat No results found for: IRON, TIBC, FERRITIN, IRONPCTSAT Lipid Panel     Component Value Date/Time   CHOL 153 09/09/2018 0932   TRIG 160 (H) 09/09/2018 0932   HDL 53 09/09/2018 0932   CHOLHDL 2.6 04/02/2018 0844   CHOLHDL 3 11/13/2015 1501   VLDL 29.8 11/13/2015 1501   LDLCALC 68 09/09/2018 0932   Hepatic Function Panel     Component Value Date/Time   PROT 6.3 09/09/2018 0932   ALBUMIN 4.3 09/09/2018 0932   AST 21 09/09/2018 0932   ALT 19 09/09/2018 0932   ALKPHOS 102 09/09/2018 0932   BILITOT 0.3 09/09/2018 0932      Component Value Date/Time   TSH 1.990 11/17/2017 1158   TSH 2.280 10/16/2017 1000   TSH 2.12 11/13/2015 1501      OBESITY BEHAVIORAL INTERVENTION VISIT  Today's visit was # 23   Starting weight: 248 lbs Starting date: 11/17/17 Today's weight : 247 lbs Today's date: 12/14/2018 Total lbs lost to date: 1    ASK: We discussed the diagnosis of obesity with Donna Vasquez R Vasquez today and Donna Vasquez agreed to give us permission to discuss obesity behavioral modification therapy today.  ASSESS: Donna Vasquez has the diagnosis of obesity and her BMI today is  37.56 Kaityln is in the action stage of change   ADVISE: Daliana was educated on the multiple health risks of obesity as well as the benefit of weight loss to improve her health. She was advised of the need for long term treatment and the importance of  lifestyle modifications to improve her current health and to decrease her risk of future health problems.  AGREE: Multiple dietary modification options and treatment options were discussed and  Merrillyn agreed to follow the recommendations documented in the above note.  ARRANGE: Dani was educated on the importance of frequent visits to treat obesity as outlined per CMS and USPSTF guidelines and agreed to schedule her next follow up appointment today.  I, Burt Knack, am acting as transcriptionist for Debbra Riding, MD  I have reviewed the above documentation for accuracy and completeness, and I agree with the above. - Debbra Riding, MD

## 2018-12-28 ENCOUNTER — Other Ambulatory Visit: Payer: Self-pay

## 2018-12-28 ENCOUNTER — Ambulatory Visit (INDEPENDENT_AMBULATORY_CARE_PROVIDER_SITE_OTHER): Payer: Managed Care, Other (non HMO) | Admitting: Family Medicine

## 2018-12-28 ENCOUNTER — Encounter (INDEPENDENT_AMBULATORY_CARE_PROVIDER_SITE_OTHER): Payer: Self-pay | Admitting: Family Medicine

## 2018-12-28 DIAGNOSIS — Z6837 Body mass index (BMI) 37.0-37.9, adult: Secondary | ICD-10-CM

## 2018-12-28 DIAGNOSIS — E8881 Metabolic syndrome: Secondary | ICD-10-CM

## 2018-12-28 DIAGNOSIS — F339 Major depressive disorder, recurrent, unspecified: Secondary | ICD-10-CM | POA: Diagnosis not present

## 2018-12-28 MED ORDER — BUPROPION HCL ER (XL) 300 MG PO TB24: 300.0000 mg | ORAL_TABLET | Freq: Every day | ORAL | 0 refills | Status: DC

## 2018-12-29 NOTE — Progress Notes (Signed)
Office: [phone]  /  Fax: [phone] TeleHealth Visit:  Donna Vasquez has verbally consented to this TeleHealth visit today. The patient is located at home, the provider is located at the [facility] office. The participants in this visit include the listed provider and patient and any and all parties involved. The visit was conducted today via FaceTime.  HPI:   Chief Complaint: OBESITY Donna Vasquez is here to discuss her progress with her obesity treatment plan. She is on the Category 2 plan and is following her eating plan approximately 75 to 80 % of the time. She states she is exercising 0 minutes 0 times per week. Donna Vasquez had to change to a virtual visit, secondary to a work meeting. She hasn't found following the plan, and re-committing to the plan, to be difficult. She has had minimal indulgences. There is no reported weight today. We were unable to weigh the patient today for this TeleHealth visit. She feels as if she has maintained weight since her last visit. She has lost 1 lb since starting treatment with [name].  Depression with emotional eating behaviors Donna Vasquez is struggling with emotional eating and using food for comfort to the extent that it is negatively impacting her health. She often snacks when she is not hungry. Donna Vasquez sometimes feels she is out of control and then feels guilty that she made poor food choices. She has been working on behavior modification techniques to help reduce her emotional eating and has been somewhat successful. Her symptoms improved with Bupropion. She shows no sign of suicidal or homicidal ideations.  Insulin Resistance Donna Vasquez has a diagnosis of insulin resistance based on her elevated fasting insulin level >5. Although Donna Vasquez blood glucose readings are still under good control, insulin resistance puts her at greater risk of metabolic syndrome and diabetes. Her last insulin level increased to 17.3 She is not taking metformin  currently and continues to work on diet and exercise to decrease risk of diabetes.  ASSESSMENT AND PLAN:  Depression, recurrent (HCC)  Insulin resistance  Class 2 severe obesity with serious comorbidity and body mass index (BMI) of 37.0 to 37.9 in adult, unspecified obesity type (HCC)  PLAN:  Depression with Emotional Eating Behaviors We discussed behavior modification techniques today to help Donna Vasquez deal with her emotional eating and depression. She has Vasquez to continue Wellbutrin XL 300 mg daily #30 with no refills and follow up as directed.  Insulin Resistance Donna Vasquez will continue to work on weight loss, exercise, and decreasing simple carbohydrates in her diet to help decrease the risk of diabetes. She was informed that eating too many simple carbohydrates or too many calories at one sitting increases the likelihood of GI side effects. We will retest labs at the next appointment. Donna Vasquez to follow up with [name] as directed to monitor her progress.  Obesity Donna Vasquez is currently in the action stage of change. As such, her goal is to continue with weight loss efforts She has Vasquez to follow the Category 2 plan Donna Vasquez has been instructed to do 10 to 15 minutes of physical activity 3 times per week for weight loss and overall health benefits. We discussed the following Behavioral Modification Strategies today: planning for success, keeping healthy foods in the home, better snacking choices, increasing lean protein intake, increasing vegetables and work on meal planning and easy cooking plans  Donna Vasquez has Vasquez to follow up with our clinic in 2 weeks. She was informed of the importance of frequent follow up visits  to maximize her success with intensive lifestyle modifications for her multiple health conditions.  ALLERGIES: Allergies  Allergen Reactions   Flonase [Fluticasone Propionate] Other (See Comments)    Nasal irritation, burning, but can tolerate advair inhaler.      Sulfa Antibiotics Hives    MEDICATIONS: Current Outpatient Medications on File Prior to Visit  Medication Sig Dispense Refill   albuterol (PROAIR HFA) 108 (90 Base) MCG/ACT inhaler Inhale 1-2 puffs into the lungs every 6 (six) hours as needed for wheezing or shortness of breath. 1 Inhaler 0   aspirin 81 MG tablet Take 81 mg by mouth daily.     aspirin-acetaminophen-caffeine (EXCEDRIN MIGRAINE) 250-250-65 MG tablet Take by mouth every 6 (six) hours as needed for headache.     dextromethorphan-guaiFENesin (MUCINEX DM) 30-600 MG 12hr tablet Take 1 tablet by mouth 2 (two) times daily as needed for cough.     Fluticasone-Salmeterol (ADVAIR) 100-50 MCG/DOSE AEPB Inhale 1 puff into the lungs 2 (two) times daily.     montelukast (SINGULAIR) 10 MG tablet TAKE 1 TABLET (10 MG TOTAL) BY MOUTH AT BEDTIME. 30 tablet 0   niacin (NIASPAN) 1000 MG CR tablet TAKE 1 TABLET BY MOUTH AT  BEDTIME 90 tablet 2   omeprazole (PRILOSEC) 20 MG capsule Take 1 capsule (20 mg total) by mouth daily. 30 capsule 2   sertraline (ZOLOFT) 100 MG tablet TAKE 1 TABLET BY MOUTH  DAILY 90 tablet 2   simvastatin (ZOCOR) 20 MG tablet TAKE 1 TABLET BY MOUTH  DAILY AT 6 PM. 90 tablet 2   traZODone (DESYREL) 50 MG tablet Take 0.5-1 tablets (25-50 mg total) by mouth at bedtime as needed for sleep. 90 tablet 3   triamcinolone cream (KENALOG) 0.1 % Apply 1 application topically 2 (two) times daily as needed. 45 g 1   Vitamin D, Ergocalciferol, (DRISDOL) 1.25 MG (50000 UT) CAPS capsule Take 1 capsule (50,000 Units total) by mouth every 7 (seven) days. 4 capsule 0   No current facility-administered medications on file prior to visit.     PAST MEDICAL HISTORY: Past Medical History:  Diagnosis Date   Allergy    Asthma    Back pain    Bulging lumbar disc    Chicken pox    Constipation    Depression    Fatigue    Frequent headaches    Gall bladder disease    Headache    Heartburn    Hyperlipidemia    IBS  (irritable bowel syndrome)    Interstitial cystitis    RA (rheumatoid arthritis) (HCC)    Shortness of breath on exertion    Trouble in sleeping    Urine incontinence    Vertigo    Vision changes    Vitamin D deficiency     PAST SURGICAL HISTORY: Past Surgical History:  Procedure Laterality Date   CHOLECYSTECTOMY     cyst removed from back     [facility] HISTORY: Social History   Tobacco Use   Smoking status: Never Smoker   Smokeless tobacco: Never Used  Substance Use Topics   Alcohol use: Yes    Alcohol/week: 0.0 standard drinks    Comment: rare--wine   Drug use: No    FAMILY HISTORY: Family History  Problem Relation Age of Onset   COPD Mother    Heart disease Mother    Polymyalgia rheumatica Mother    Emphysema Mother  Stroke Mother    Depression Mother    Anxiety disorder Mother    Heart disease Father    Hypertension Father    Hyperlipidemia Father    Anxiety disorder Father    Cancer Paternal Aunt        Breast   Breast cancer Paternal Aunt 81   Stroke Maternal Grandmother    Cancer Maternal Aunt     ROS: Review of Systems  Constitutional: Negative for weight loss.  Psychiatric/Behavioral: Positive for depression. Negative for suicidal ideas.    PHYSICAL EXAM: Pt in no acute distress  RECENT LABS AND TESTS: BMET    Component Value Date/Time   NA 144 09/09/2018 0932   K 4.0 09/09/2018 0932   CL 104 09/09/2018 0932   CO2 24 09/09/2018 0932   GLUCOSE 89 09/09/2018 0932   GLUCOSE 93 11/13/2015 1501   BUN 14 09/09/2018 0932   CREATININE 1.04 (H) 09/09/2018 0932   CALCIUM 9.3 09/09/2018 0932   GFRNONAA 61 09/09/2018 0932   GFRAA 71 09/09/2018 0932   Lab Results  Component Value Date   HGBA1C 5.1 09/09/2018   HGBA1C 5.0 04/02/2018   HGBA1C 5.1 11/17/2017   HGBA1C 5.0 11/13/2015   Lab Results  Component Value Date   INSULIN 17.3 09/09/2018   INSULIN 10.1  04/02/2018   INSULIN 8.7 11/17/2017   CBC    Component Value Date/Time   WBC 6.0 10/16/2017 1000   WBC 8.6 11/13/2015 1501   RBC 4.39 10/16/2017 1000   RBC 4.49 11/13/2015 1501   HGB 13.4 10/16/2017 1000   HCT 40.2 10/16/2017 1000   PLT 229 10/16/2017 1000   MCV 92 10/16/2017 1000   MCH 30.5 10/16/2017 1000   MCHC 33.3 10/16/2017 1000   MCHC 34.0 11/13/2015 1501   RDW 13.3 10/16/2017 1000   LYMPHSABS 1.4 10/16/2017 1000   EOSABS 0.2 10/16/2017 1000   BASOSABS 0.0 10/16/2017 1000   Iron/TIBC/Ferritin/ %Sat No results found for: IRON, TIBC, FERRITIN, IRONPCTSAT Lipid Panel     Component Value Date/Time   CHOL 153 09/09/2018 0932   TRIG 160 (H) 09/09/2018 0932   HDL 53 09/09/2018 0932   CHOLHDL 2.6 04/02/2018 0844   CHOLHDL 3 11/13/2015 1501   VLDL 29.8 11/13/2015 1501   LDLCALC 68 09/09/2018 0932   Hepatic Function Panel     Component Value Date/Time   PROT 6.3 09/09/2018 0932   ALBUMIN 4.3 09/09/2018 0932   AST 21 09/09/2018 0932   ALT 19 09/09/2018 0932   ALKPHOS 102 09/09/2018 0932   BILITOT 0.3 09/09/2018 0932      Component Value Date/Time   TSH 1.990 11/17/2017 1158   TSH 2.280 10/16/2017 1000   TSH 2.12 11/13/2015 1501     Ref. Range 09/09/2018 09:32  Vitamin D, 25-Hydroxy Latest Ref Range: 30.0 - 100.0 ng/mL 31.5    I, [place] Crane, am acting as Energy manager for Filbert Schilder, MD  I have reviewed the above documentation for accuracy and completeness, and I agree with the above. - Debbra Riding, MD

## 2019-01-11 ENCOUNTER — Ambulatory Visit (INDEPENDENT_AMBULATORY_CARE_PROVIDER_SITE_OTHER): Payer: Managed Care, Other (non HMO) | Admitting: Family Medicine

## 2019-01-11 ENCOUNTER — Other Ambulatory Visit: Payer: Self-pay

## 2019-01-11 ENCOUNTER — Encounter (INDEPENDENT_AMBULATORY_CARE_PROVIDER_SITE_OTHER): Payer: Self-pay | Admitting: Family Medicine

## 2019-01-11 DIAGNOSIS — Z6834 Body mass index (BMI) 34.0-34.9, adult: Secondary | ICD-10-CM | POA: Diagnosis not present

## 2019-01-11 DIAGNOSIS — E669 Obesity, unspecified: Secondary | ICD-10-CM

## 2019-01-11 DIAGNOSIS — E559 Vitamin D deficiency, unspecified: Secondary | ICD-10-CM

## 2019-01-11 DIAGNOSIS — R030 Elevated blood-pressure reading, without diagnosis of hypertension: Secondary | ICD-10-CM | POA: Diagnosis not present

## 2019-01-11 MED ORDER — LISINOPRIL 5 MG PO TABS: 5.0000 mg | ORAL_TABLET | Freq: Every day | ORAL | 0 refills | Status: DC

## 2019-01-12 NOTE — Progress Notes (Signed)
Office: [phone]  /  Fax: [phone] TeleHealth Visit:  Donna Vasquez has verbally consented to this TeleHealth visit today. The patient is located in the [facility] parking lot, the provider is located at the [facility] office. The participants in this visit include the listed provider and patient. The visit was conducted today via FaceTime.  HPI:   Chief Complaint: OBESITY Donna Vasquez is here to discuss her progress with her obesity treatment plan. She is on the Category 2 plan and is following her eating plan approximately 75% of the time. She states she is exercising 0 minutes 0 times per week. Donna Vasquez's daughter was exposed to COVID and her father fell and cut his head and exposed shunt. She reports no weight changes.  We were unable to weigh the patient today for this TeleHealth visit. She feels as if she has maintained her weight since her last visit. She has lost 1 lb since starting treatment with us.  Elevated Blood Pressure Reading Donna Vasquez is a 53 y.o. female with hypertension and is on no medications. Donna RamaKathryn R Alanis denies chest pain, chest pressure, or headache. She does note an increase in stress recently and reports her blood pressure at her wellness appointment was 188/64.   Vitamin D deficiency Donna Vasquez has a diagnosis of Vitamin D deficiency. She is currently taking prescription Vit D and denies nausea, vomiting or muscle weakness but does admit to fatigue.  ASSESSMENT AND PLAN:  Elevated blood pressure reading - Plan: lisinopril (ZESTRIL) 5 MG tablet  Vitamin D deficiency  Class 1 obesity with serious comorbidity and body mass index (BMI) of 34.0 to 34.9 in adult, unspecified obesity type  PLAN:  Elevated Blood Pressure Reading Donna Vasquez will monitor blood pressure 2 times in the next 3 days. She will start lisinopril 5 mg PO QD #30 with 0 refills and agrees to follow-up with our clinic in 2 weeks.  Vitamin D Deficiency Donna Vasquez was  informed that low Vitamin D levels contributes to fatigue and are associated with obesity, breast, and colon cancer. She agrees to continue prescription Vit D and will follow-up for routine testing of Vitamin D, at least 2-3 times per year. She was informed of the risk of over-replacement of Vitamin D and agrees to not increase her dose unless she discusses this with us first. Donna Vasquez agrees to follow-up with our clinic in 2 weeks.  Obesity Donna Vasquez is currently in the action stage of change. As such, her goal is to continue with weight loss efforts. She has agreed to follow the Category 2 plan. Donna Vasquez has been instructed to work up to a goal of 150 minutes of combined cardio and strengthening exercise per week for weight loss and overall health benefits. We discussed the following Behavioral Modification Strategies today: increasing lean protein intake, increasing vegetables, work on meal planning and easy cooking plans, keeping healthy foods in the home, and planning for success.  Donna Vasquez has agreed to follow-up with our clinic in 2 weeks. She was informed of the importance of frequent follow-up visits to maximize her success with intensive lifestyle modifications for her multiple health conditions.  ALLERGIES: Allergies  Allergen Reactions  . Flonase [Fluticasone Propionate] Other (See Comments)    Nasal irritation, burning, but can tolerate advair inhaler.    . Sulfa Antibiotics Hives    MEDICATIONS: Current Outpatient Medications on File Prior to Visit  Medication Sig Dispense Refill  . albuterol (PROAIR HFA) 108 (90 Base) MCG/ACT inhaler Inhale 1-2 puffs into  the lungs every 6 (six) hours as needed for wheezing or shortness of breath. 1 Inhaler 0  . aspirin 81 MG tablet Take 81 mg by mouth daily.    [name] aspirin-acetaminophen-caffeine (EXCEDRIN MIGRAINE) 250-250-65 MG tablet Take by mouth every 6 (six) hours as needed for headache.    [name] buPROPion (WELLBUTRIN XL) 300 MG 24 hr tablet Take 1  tablet (300 mg total) by mouth daily. 30 tablet 0  . dextromethorphan-guaiFENesin (MUCINEX DM) 30-600 MG 12hr tablet Take 1 tablet by mouth 2 (two) times daily as needed for cough.    . Fluticasone-Salmeterol (ADVAIR) 100-50 MCG/DOSE AEPB Inhale 1 puff into the lungs 2 (two) times daily.    . montelukast (SINGULAIR) 10 MG tablet TAKE 1 TABLET (10 MG TOTAL) BY MOUTH AT BEDTIME. 30 tablet 0  . niacin (NIASPAN) 1000 MG CR tablet TAKE 1 TABLET BY MOUTH AT  BEDTIME 90 tablet 2  . omeprazole (PRILOSEC) 20 MG capsule Take 1 capsule (20 mg total) by mouth daily. 30 capsule 2  . sertraline (ZOLOFT) 100 MG tablet TAKE 1 TABLET BY MOUTH  DAILY 90 tablet 2  . simvastatin (ZOCOR) 20 MG tablet TAKE 1 TABLET BY MOUTH  DAILY AT 6 PM. 90 tablet 2  . traZODone (DESYREL) 50 MG tablet Take 0.5-1 tablets (25-50 mg total) by mouth at bedtime as needed for sleep. 90 tablet 3  . triamcinolone cream (KENALOG) 0.1 % Apply 1 application topically 2 (two) times daily as needed. 45 g 1  . Vitamin D, Ergocalciferol, (DRISDOL) 1.25 MG (50000 UT) CAPS capsule Take 1 capsule (50,000 Units total) by mouth every 7 (seven) days. 4 capsule 0   No current facility-administered medications on file prior to visit.     PAST MEDICAL HISTORY: Past Medical History:  Diagnosis Date  . Allergy   . Asthma   . Back pain   . Bulging lumbar disc   . Chicken pox   . Constipation   . Depression   . Fatigue   . Frequent headaches   . Gall bladder disease   . Headache   . Heartburn   . Hyperlipidemia   . IBS (irritable bowel syndrome)   . Interstitial cystitis   . RA (rheumatoid arthritis) (H[facility])   . Shortness of breath on exertion   . Trouble in sleeping   . Urine incontinence   . Vertigo   . Vision changes   . Vitamin D deficiency     PAST SURGICAL HISTORY: Past Surgical History:  Procedure Laterality Date  . CHOLECYSTECTOMY    . cyst removed from back    . NASAL SINUS SURGERY    . TUBAL LIGATION  1999    SOCIAL  HISTORY: Social History   Tobacco Use  . Smoking status: Never Smoker  . Smokeless tobacco: Never Used  Substance Use Topics  . Alcohol use: Yes    Alcohol/week: 0.0 standard drinks    Comment: rare--wine  . Drug use: No    FAMILY HISTORY: Family History  Problem Relation Age of Onset  . COPD Mother   . Heart disease Mother   . Polymyalgia rheumatica Mother   . Emphysema Mother   . Stroke Mother   . Depression Mother   . Anxiety disorder Mother   . Heart disease Father   . Hypertension Father   . Hyperlipidemia Father   . Anxiety disorder Father   . Cancer Paternal Aunt        Breast  . Breast cancer Paternal  Aunt 50  . Stroke Maternal Grandmother   . Cancer Maternal Aunt    ROS: Review of Systems  Constitutional: Positive for malaise/fatigue.  Cardiovascular: Negative for chest pain.       Negative for chest pressure.  Gastrointestinal: Negative for nausea and vomiting.  Musculoskeletal:       Negative for muscle weakness.  Neurological: Negative for headaches.   PHYSICAL EXAM: Pt in no acute distress  RECENT LABS AND TESTS: BMET    Component Value Date/Time   NA 144 09/09/2018 0932   K 4.0 09/09/2018 0932   CL 104 09/09/2018 0932   CO2 24 09/09/2018 0932   GLUCOSE 89 09/09/2018 0932   GLUCOSE 93 11/13/2015 1501   BUN 14 09/09/2018 0932   CREATININE 1.04 (H) 09/09/2018 0932   CALCIUM 9.3 09/09/2018 0932   GFRNONAA 61 09/09/2018 0932   GFRAA 71 09/09/2018 0932   Lab Results  Component Value Date   HGBA1C 5.1 09/09/2018   HGBA1C 5.0 04/02/2018   HGBA1C 5.1 11/17/2017   HGBA1C 5.0 11/13/2015   Lab Results  Component Value Date   INSULIN 17.3 09/09/2018   INSULIN 10.1 04/02/2018   INSULIN 8.7 11/17/2017   CBC    Component Value Date/Time   WBC 6.0 10/16/2017 1000   WBC 8.6 11/13/2015 1501   RBC 4.39 10/16/2017 1000   RBC 4.49 11/13/2015 1501   HGB 13.4 10/16/2017 1000   HCT 40.2 10/16/2017 1000   PLT 229 10/16/2017 1000   MCV 92  10/16/2017 1000   MCH 30.5 10/16/2017 1000   MCHC 33.3 10/16/2017 1000   MCHC 34.0 11/13/2015 1501   RDW 13.3 10/16/2017 1000   LYMPHSABS 1.4 10/16/2017 1000   EOSABS 0.2 10/16/2017 1000   BASOSABS 0.0 10/16/2017 1000   Iron/TIBC/Ferritin/ %Sat No results found for: IRON, TIBC, FERRITIN, IRONPCTSAT Lipid Panel     Component Value Date/Time   CHOL 153 09/09/2018 0932   TRIG 160 (H) 09/09/2018 0932   HDL 53 09/09/2018 0932   CHOLHDL 2.6 04/02/2018 0844   CHOLHDL 3 11/13/2015 1501   VLDL 29.8 11/13/2015 1501   LDLCALC 68 09/09/2018 0932   Hepatic Function Panel     Component Value Date/Time   PROT 6.3 09/09/2018 0932   ALBUMIN 4.3 09/09/2018 0932   AST 21 09/09/2018 0932   ALT 19 09/09/2018 0932   ALKPHOS 102 09/09/2018 0932   BILITOT 0.3 09/09/2018 0932      Component Value Date/Time   TSH 1.990 11/17/2017 1158   TSH 2.280 10/16/2017 1000   TSH 2.12 11/13/2015 1501   Results for [facility], [place] R "KATIE" (MRN [id number]) as of 01/12/2019 09:45  Ref. Range 09/09/2018 09:32  Vitamin D, 25-Hydroxy Latest Ref Range: 30.0 - 100.0 ng/mL 31.5   I, Michaelene Song, am acting as Location manager for Ilene Qua, MD  I have reviewed the above documentation for accuracy and completeness, and I agree with the above. - Ilene Qua, MD

## 2019-01-15 ENCOUNTER — Other Ambulatory Visit (INDEPENDENT_AMBULATORY_CARE_PROVIDER_SITE_OTHER): Payer: Self-pay | Admitting: Family Medicine

## 2019-01-28 ENCOUNTER — Encounter (INDEPENDENT_AMBULATORY_CARE_PROVIDER_SITE_OTHER): Payer: Self-pay | Admitting: Family Medicine

## 2019-01-28 ENCOUNTER — Telehealth (INDEPENDENT_AMBULATORY_CARE_PROVIDER_SITE_OTHER): Payer: Managed Care, Other (non HMO) | Admitting: Family Medicine

## 2019-01-28 ENCOUNTER — Other Ambulatory Visit: Payer: Self-pay

## 2019-01-28 DIAGNOSIS — E669 Obesity, unspecified: Secondary | ICD-10-CM

## 2019-01-28 DIAGNOSIS — Z6834 Body mass index (BMI) 34.0-34.9, adult: Secondary | ICD-10-CM

## 2019-01-28 DIAGNOSIS — E559 Vitamin D deficiency, unspecified: Secondary | ICD-10-CM

## 2019-01-28 DIAGNOSIS — J4520 Mild intermittent asthma, uncomplicated: Secondary | ICD-10-CM | POA: Diagnosis not present

## 2019-01-28 DIAGNOSIS — IMO0001 Reserved for inherently not codable concepts without codable children: Secondary | ICD-10-CM

## 2019-01-28 MED ORDER — MONTELUKAST SODIUM 10 MG PO TABS: ORAL_TABLET | ORAL | 0 refills | Status: DC

## 2019-01-28 MED ORDER — VITAMIN D (ERGOCALCIFEROL) 1.25 MG (50000 UNIT) PO CAPS: 50000.0000 [IU] | ORAL_CAPSULE | ORAL | 0 refills | Status: DC

## 2019-02-01 NOTE — Progress Notes (Signed)
Office: [phone]  /  Fax: [phone] TeleHealth Visit:  Donna Vasquez has verbally consented to this TeleHealth visit today. The patient is located at home, the provider is located at the [facility] office. The participants in this visit include the listed provider and patient. The visit was conducted today via face time.  HPI:   Chief Complaint: OBESITY Donna Vasquez is here to discuss her progress with her obesity treatment plan. She is on the Category 2 plan and is following her eating plan approximately 80-85 % of the time. She states she is exercising 0 minutes 0 times per week. Donna Vasquez's weight is of 248 lbs today (reports 1 lb loss since last appointment). She feels she has done better the last few weeks on the meal plan; particularly breakfast and lunch but she is still working on getting food in at dinner. She is not getting in 6-8 oz but working on it. She is doing vegetable but not always 2 cups.  We were unable to weigh the patient today for this TeleHealth visit. She feels as if she has lost 1 lb since her last visit. She has lost 1 lb since starting treatment with [name].  Asthma, Moderate Intermittent Donna Vasquez has a diagnosis of asthma. She note her symptoms have increased with change in weather. Her symptoms can be controlled with Singulair.  Vitamin D Deficiency Donna Vasquez has a diagnosis of vitamin D deficiency. She is currently taking prescription Vit D. She notes fatigue and denies nausea, vomiting or muscle weakness.  Elevated Blood Pressure Donna Vasquez's blood pressure at home was 119/87, and 117/85. She denies chest pain, chest pressure, or headaches.  ASSESSMENT AND PLAN:  Vitamin D deficiency - Plan: Vitamin D, Ergocalciferol, (DRISDOL) 1.25 MG (50000 UT) CAPS capsule  Moderate intermittent asthma without complication - Plan: montelukast (SINGULAIR) 10 MG tablet  Class 1 obesity with serious comorbidity and body mass index (BMI) of 34.0 to 34.9 in adult,  unspecified obesity type  PLAN:  Asthma, Moderate Intermittent Donna Vasquez agrees to continue taking Singulair 10 mg PO daily #30 and we will refill for 1 month. Donna Vasquez agrees to follow up with our clinic in 2 weeks.  Vitamin D Deficiency Donna Vasquez was informed that low vitamin D levels contributes to fatigue and are associated with obesity, breast, and colon cancer. Donna Vasquez agrees to continue taking prescription Vit D 50,000 IU every week #4 and we will refill for 1 month. She will follow up for routine testing of vitamin D, at least 2-3 times per year. She was informed of the risk of over-replacement of vitamin D and agrees to not increase her dose unless she discusses this with [name] first. Donna Vasquez agrees to follow up with our clinic in 2 weeks.  Elevated Blood Pressure We will follow up on her blood pressure at her next appointment. Donna Vasquez agrees to follow up with our clinic in 2 weeks.  Obesity Donna Vasquez is currently in the action stage of change. As such, her goal is to continue with weight loss efforts She has agreed to follow the Category 2 plan Donna Vasquez has been instructed to work up to a goal of 150 minutes of combined cardio and strengthening exercise per week for weight loss and overall health benefits. We discussed the following Behavioral Modification Strategies today: increasing lean protein intake, increasing vegetables and work on meal planning and easy cooking plans, keeping healthy foods in the home, and planning for success   Donna Vasquez has agreed to follow up with our clinic in  2 weeks. She was informed of the importance of frequent follow up visits to maximize her success with intensive lifestyle modifications for her multiple health conditions.  ALLERGIES: Allergies  Allergen Reactions  . Flonase [Fluticasone Propionate] Other (See Comments)    Nasal irritation, burning, but can tolerate advair inhaler.    . Sulfa Antibiotics Hives    MEDICATIONS: Current Outpatient  Medications on File Prior to Visit  Medication Sig Dispense Refill  . albuterol (PROAIR HFA) 108 (90 Base) MCG/ACT inhaler Inhale 1-2 puffs into the lungs every 6 (six) hours as needed for wheezing or shortness of breath. 1 Inhaler 0  . aspirin 81 MG tablet Take 81 mg by mouth daily.    [name] aspirin-acetaminophen-caffeine (EXCEDRIN MIGRAINE) 250-250-65 MG tablet Take by mouth every 6 (six) hours as needed for headache.    . dextromethorphan-guaiFENesin (MUCINEX DM) 30-600 MG 12hr tablet Take 1 tablet by mouth 2 (two) times daily as needed for cough.    . Fluticasone-Salmeterol (ADVAIR) 100-50 MCG/DOSE AEPB Inhale 1 puff into the lungs 2 (two) times daily.    [name] lisinopril (ZESTRIL) 5 MG tablet Take 1 tablet (5 mg total) by mouth daily. 30 tablet 0  . niacin (NIASPAN) 1000 MG CR tablet TAKE 1 TABLET BY MOUTH AT  BEDTIME 90 tablet 2  . omeprazole (PRILOSEC) 20 MG capsule Take 1 capsule (20 mg total) by mouth daily. 30 capsule 2  . sertraline (ZOLOFT) 100 MG tablet TAKE 1 TABLET BY MOUTH  DAILY 90 tablet 2  . simvastatin (ZOCOR) 20 MG tablet TAKE 1 TABLET BY MOUTH  DAILY AT 6 PM. 90 tablet 2  . traZODone (DESYREL) 50 MG tablet Take 0.5-1 tablets (25-50 mg total) by mouth at bedtime as needed for sleep. 90 tablet 3  . triamcinolone cream (KENALOG) 0.1 % Apply 1 application topically 2 (two) times daily as needed. 45 g 1  . buPROPion (WELLBUTRIN XL) 300 MG 24 hr tablet Take 1 tablet (300 mg total) by mouth daily. 30 tablet 0   No current facility-administered medications on file prior to visit.     PAST MEDICAL HISTORY: Past Medical History:  Diagnosis Date  . Allergy   . Asthma   . Back pain   . Bulging lumbar disc   . Chicken pox   . Constipation   . Depression   . Fatigue   . Frequent headaches   . Gall bladder disease   . Headache   . Heartburn   . Hyperlipidemia   . IBS (irritable bowel syndrome)   . Interstitial cystitis   . RA (rheumatoid arthritis) ([place])   . Shortness of breath  on exertion   . Trouble in sleeping   . Urine incontinence   . Vertigo   . Vision changes   . Vitamin D deficiency     PAST SURGICAL HISTORY: Past Surgical History:  Procedure Laterality Date  . CHOLECYSTECTOMY    . cyst removed from back    . NASAL SINUS SURGERY    . TUBAL LIGATION  1999    SOCIAL HISTORY: Social History   Tobacco Use  . Smoking status: Never Smoker  . Smokeless tobacco: Never Used  Substance Use Topics  . Alcohol use: Yes    Alcohol/week: 0.0 standard drinks    Comment: rare--wine  . Drug use: No    FAMILY HISTORY: Family History  Problem Relation Age of Onset  . COPD Mother   . Heart disease Mother   . Polymyalgia rheumatica Mother   .  Emphysema Mother   . Stroke Mother   . Depression Mother   . Anxiety disorder Mother   . Heart disease Father   . Hypertension Father   . Hyperlipidemia Father   . Anxiety disorder Father   . Cancer Paternal Aunt        Breast  . Breast cancer Paternal Aunt 72  . Stroke Maternal Grandmother   . Cancer Maternal Aunt     ROS: Review of Systems  Constitutional: Positive for malaise/fatigue and weight loss.  Cardiovascular: Negative for chest pain.       Negative chest pressure  Gastrointestinal: Negative for nausea and vomiting.  Musculoskeletal:       Negative muscle weakness  Neurological: Negative for headaches.    PHYSICAL EXAM: Pt in no acute distress  RECENT LABS AND TESTS: BMET    Component Value Date/Time   NA 144 09/09/2018 0932   K 4.0 09/09/2018 0932   CL 104 09/09/2018 0932   CO2 24 09/09/2018 0932   GLUCOSE 89 09/09/2018 0932   GLUCOSE 93 11/13/2015 1501   BUN 14 09/09/2018 0932   CREATININE 1.04 (H) 09/09/2018 0932   CALCIUM 9.3 09/09/2018 0932   GFRNONAA 61 09/09/2018 0932   GFRAA 71 09/09/2018 0932   Lab Results  Component Value Date   HGBA1C 5.1 09/09/2018   HGBA1C 5.0 04/02/2018   HGBA1C 5.1 11/17/2017   HGBA1C 5.0 11/13/2015   Lab Results  Component Value Date    INSULIN 17.3 09/09/2018   INSULIN 10.1 04/02/2018   INSULIN 8.7 11/17/2017   CBC    Component Value Date/Time   WBC 6.0 10/16/2017 1000   WBC 8.6 11/13/2015 1501   RBC 4.39 10/16/2017 1000   RBC 4.49 11/13/2015 1501   HGB 13.4 10/16/2017 1000   HCT 40.2 10/16/2017 1000   PLT 229 10/16/2017 1000   MCV 92 10/16/2017 1000   MCH 30.5 10/16/2017 1000   MCHC 33.3 10/16/2017 1000   MCHC 34.0 11/13/2015 1501   RDW 13.3 10/16/2017 1000   LYMPHSABS 1.4 10/16/2017 1000   EOSABS 0.2 10/16/2017 1000   BASOSABS 0.0 10/16/2017 1000   Iron/TIBC/Ferritin/ %Sat No results found for: IRON, TIBC, FERRITIN, IRONPCTSAT Lipid Panel     Component Value Date/Time   CHOL 153 09/09/2018 0932   TRIG 160 (H) 09/09/2018 0932   HDL 53 09/09/2018 0932   CHOLHDL 2.6 04/02/2018 0844   CHOLHDL 3 11/13/2015 1501   VLDL 29.8 11/13/2015 1501   LDLCALC 68 09/09/2018 0932   Hepatic Function Panel     Component Value Date/Time   PROT 6.3 09/09/2018 0932   ALBUMIN 4.3 09/09/2018 0932   AST 21 09/09/2018 0932   ALT 19 09/09/2018 0932   ALKPHOS 102 09/09/2018 0932   BILITOT 0.3 09/09/2018 0932      Component Value Date/Time   TSH 1.990 11/17/2017 1158   TSH 2.280 10/16/2017 1000   TSH 2.12 11/13/2015 1501      I, Burt Knack, am acting as transcriptionist for Debbra Riding, MD  I have reviewed the above documentation for accuracy and completeness, and I agree with the above. - Debbra Riding, MD

## 2019-02-09 ENCOUNTER — Encounter (INDEPENDENT_AMBULATORY_CARE_PROVIDER_SITE_OTHER): Payer: Self-pay | Admitting: Family Medicine

## 2019-02-09 ENCOUNTER — Ambulatory Visit (INDEPENDENT_AMBULATORY_CARE_PROVIDER_SITE_OTHER): Payer: Managed Care, Other (non HMO) | Admitting: Family Medicine

## 2019-02-09 ENCOUNTER — Other Ambulatory Visit: Payer: Self-pay

## 2019-02-09 DIAGNOSIS — R030 Elevated blood-pressure reading, without diagnosis of hypertension: Secondary | ICD-10-CM | POA: Diagnosis not present

## 2019-02-09 DIAGNOSIS — F339 Major depressive disorder, recurrent, unspecified: Secondary | ICD-10-CM | POA: Diagnosis not present

## 2019-02-09 DIAGNOSIS — E559 Vitamin D deficiency, unspecified: Secondary | ICD-10-CM

## 2019-02-09 DIAGNOSIS — Z6837 Body mass index (BMI) 37.0-37.9, adult: Secondary | ICD-10-CM

## 2019-02-09 MED ORDER — LOSARTAN POTASSIUM 25 MG PO TABS: 12.5000 mg | ORAL_TABLET | Freq: Every day | ORAL | 0 refills | Status: DC

## 2019-02-09 MED ORDER — BUPROPION HCL ER (XL) 300 MG PO TB24: 300.0000 mg | ORAL_TABLET | Freq: Every day | ORAL | 0 refills | Status: DC

## 2019-02-11 NOTE — Progress Notes (Signed)
Office: [phone]  /  Fax: [phone] TeleHealth Visit:  [name] has verbally consented to this TeleHealth visit today. The patient is located at home, the provider is located at the [facility] office. The participants in this visit include the listed provider and patient. The visit was conducted today via face time.  HPI:   Chief Complaint: OBESITY Donna Vasquez is here to discuss her progress with her obesity treatment plan. She is on the Category 2 plan and is following her eating plan approximately 75-80 % of the time. She states she is exercising 0 minutes 0 times per week. Donna Vasquez is experiencing diarrhea and is concerned about relation with lisinopril. She has had less appetite, secondary to diarrhea. She is often feeling like if she eats, her diarrhea is imminent. We were unable to weigh the patient today for this TeleHealth visit. She feels as if she has maintained her weight since her last visit. She has lost 1 lb since starting treatment with [name].  Hypertension MICHAELIA BEILFUSS is a 53 y.o. female with hypertension. Afnan's blood pressure is slightly elevated today at 146/95. She denies chest pain, chest pressure, or headaches. She reports her blood pressure at home is fairly well controlled with systolic in [id number]-720'N. She is working on weight loss to help control her blood pressure with the goal of decreasing her risk of heart attack and stroke.   Vitamin D Deficiency Donna Vasquez has a diagnosis of vitamin D deficiency. She is currently taking prescription Vit D. She notes fatigue and denies nausea, vomiting or muscle weakness.  Depression with Emotional Eating Behaviors Donna Vasquez is struggling with emotional eating and using food for comfort to the extent that it is negatively impacting her health. She often snacks when she is not hungry. Donna Vasquez sometimes feels she is out of control and then feels guilty that she made poor food choices. She has been working  on behavior modification techniques to help reduce her emotional eating and has been somewhat successful. She notes her symptoms are better controlled. She shows no sign of suicidal or homicidal ideations.  Depression screen Donna Vasquez 2/9 05/25/2018 11/17/2017 10/16/2017 09/15/2017 02/20/2017  Decreased Interest 1 3 1 3  -  Down, Depressed, Hopeless 1 3 1 3  0  PHQ - 2 Score 2 6 2 6  0  Altered sleeping 0 2 2 3  -  Tired, decreased energy 1 3 2 3  -  Change in appetite 0 3 2 3  -  Feeling bad or failure about yourself  1 0 2 3 -  Trouble concentrating 1 3 1 3  -  Moving slowly or fidgety/restless 0 0 0 0 -  Suicidal thoughts 0 0 0 0 -  PHQ-9 Score 5 17 11 21  -  Difficult doing work/chores Somewhat difficult Somewhat difficult Very difficult Very difficult -    ASSESSMENT AND PLAN:  Elevated blood pressure reading  Vitamin D deficiency  Depression, recurrent ([place]) - Plan: buPROPion (WELLBUTRIN XL) 300 MG 24 hr tablet  Class 2 severe obesity with serious comorbidity and body mass index (BMI) of 37.0 to 37.9 in adult, unspecified obesity type (HCC)  PLAN:  Hypertension We discussed sodium restriction, working on healthy weight loss, and a regular exercise program as the means to achieve improved blood pressure control. [name] agreed with this plan and agreed to follow up as directed. We will continue to monitor her blood pressure as well as her progress with the above lifestyle modifications. Donna Vasquez agrees to stop lisinopril and she agrees  to start losartan 25 mg PO daily #30 with no refills. She will watch for signs of hypotension as she continues her lifestyle modifications. [name] agrees to follow up with our clinic in 2 weeks.  Vitamin D Deficiency Donna Vasquez was informed that low vitamin D levels contributes to fatigue and are associated with obesity, breast, and colon cancer. Donna Vasquez agrees to continue taking prescription Vit D 50,000 IU every week and will follow up for routine testing of vitamin D,  at least 2-3 times per year. She was informed of the risk of over-replacement of vitamin D and agrees to not increase her dose unless she discusses this with [name] first. Donna Vasquez agrees to follow up with our clinic in 2 weeks.  Depression with Emotional Eating Behaviors We discussed behavior modification techniques today to help Donna Vasquez deal with her emotional eating and depression. Donna Vasquez agrees continue taking Wellbutrin XL 300 mg PO q daily #30 and we will refill for 1 month. Donna Vasquez agrees to follow up with our clinic in 2 weeks.  Obesity Donna Vasquez is currently in the action stage of change. As such, her goal is to continue with weight loss efforts She has agreed to follow the Category 2 plan Donna Vasquez has been instructed to work up to a goal of 150 minutes of combined cardio and strengthening exercise per week for weight loss and overall health benefits. We discussed the following Behavioral Modification Strategies today: increasing lean protein intake, increasing vegetables and work on meal planning and easy cooking plans, keeping healthy foods in the home, and planning for success   Donna Vasquez has agreed to follow up with our clinic in 2 weeks. She was informed of the importance of frequent follow up visits to maximize her success with intensive lifestyle modifications for her multiple health conditions.  ALLERGIES: Allergies  Allergen Reactions   Flonase [Fluticasone Propionate] Other (See Comments)    Nasal irritation, burning, but can tolerate advair inhaler.     Sulfa Antibiotics Hives    MEDICATIONS: Current Outpatient Medications on File Prior to Visit  Medication Sig Dispense Refill   albuterol (PROAIR HFA) 108 (90 Base) MCG/ACT inhaler Inhale 1-2 puffs into the lungs every 6 (six) hours as needed for wheezing or shortness of breath. 1 Inhaler 0   aspirin 81 MG tablet Take 81 mg by mouth daily.     aspirin-acetaminophen-caffeine (EXCEDRIN MIGRAINE) 250-250-65 MG tablet Take by  mouth every 6 (six) hours as needed for headache.     dextromethorphan-guaiFENesin (MUCINEX DM) 30-600 MG 12hr tablet Take 1 tablet by mouth 2 (two) times daily as needed for cough.     Fluticasone-Salmeterol (ADVAIR) 100-50 MCG/DOSE AEPB Inhale 1 puff into the lungs 2 (two) times daily.     montelukast (SINGULAIR) 10 MG tablet TAKE 1 TABLET (10 MG TOTAL) BY MOUTH AT BEDTIME. 30 tablet 0   niacin (NIASPAN) 1000 MG CR tablet TAKE 1 TABLET BY MOUTH AT  BEDTIME 90 tablet 2   omeprazole (PRILOSEC) 20 MG capsule Take 1 capsule (20 mg total) by mouth daily. 30 capsule 2   sertraline (ZOLOFT) 100 MG tablet TAKE 1 TABLET BY MOUTH  DAILY 90 tablet 2   simvastatin (ZOCOR) 20 MG tablet TAKE 1 TABLET BY MOUTH  DAILY AT 6 PM. 90 tablet 2   traZODone (DESYREL) 50 MG tablet Take 0.5-1 tablets (25-50 mg total) by mouth at bedtime as needed for sleep. 90 tablet 3   triamcinolone cream (KENALOG) 0.1 % Apply 1 application topically 2 (two) times daily as  needed. 45 g 1   Vitamin D, Ergocalciferol, (DRISDOL) 1.25 MG (50000 UT) CAPS capsule Take 1 capsule (50,000 Units total) by mouth every 7 (seven) days. 4 capsule 0   No current facility-administered medications on file prior to visit.     PAST MEDICAL HISTORY: Past Medical History:  Diagnosis Date   Allergy    Asthma    Back pain    Bulging lumbar disc    Chicken pox    Constipation    Depression    Fatigue    Frequent headaches    Gall bladder disease    Headache    Heartburn    Hyperlipidemia    IBS (irritable bowel syndrome)    Interstitial cystitis    RA (rheumatoid arthritis) (HCC)    Shortness of breath on exertion    Trouble in sleeping    Urine incontinence    Vertigo    Vision changes    Vitamin D deficiency     PAST SURGICAL HISTORY: Past Surgical History:  Procedure Laterality Date   CHOLECYSTECTOMY     cyst removed from back     NASAL SINUS SURGERY     TUBAL LIGATION  1999    SOCIAL  HISTORY: Social History   Tobacco Use   Smoking status: Never Smoker   Smokeless tobacco: Never Used  Substance Use Topics   Alcohol use: Yes    Alcohol/week: 0.0 standard drinks    Comment: rare--wine   Drug use: No    FAMILY HISTORY: Family History  Problem Relation Age of Onset   COPD Mother    Heart disease Mother    Polymyalgia rheumatica Mother    Emphysema Mother    Stroke Mother    Depression Mother    Anxiety disorder Mother    Heart disease Father    Hypertension Father    Hyperlipidemia Father    Anxiety disorder Father    Cancer Paternal Aunt        Breast   Breast cancer Paternal Aunt [id number]   Stroke Maternal Grandmother    Cancer Maternal Aunt     ROS: Review of Systems  Constitutional: Positive for malaise/fatigue. Negative for weight loss.  Cardiovascular: Negative for chest pain.       Negative chest pressure  Gastrointestinal: Positive for diarrhea. Negative for nausea and vomiting.  Musculoskeletal:       Negative muscle weakness  Neurological: Negative for headaches.  Psychiatric/Behavioral: Positive for depression. Negative for suicidal ideas.    PHYSICAL EXAM: Pt in no acute distress  RECENT LABS AND TESTS: BMET    Component Value Date/Time   NA 144 09/09/2018 0932   K 4.0 09/09/2018 0932   CL 104 09/09/2018 0932   CO2 24 09/09/2018 0932   GLUCOSE 89 09/09/2018 0932   GLUCOSE 93 11/13/2015 1501   BUN 14 09/09/2018 0932   CREATININE 1.04 (H) 09/09/2018 0932   CALCIUM 9.3 09/09/2018 0932   GFRNONAA 61 09/09/2018 0932   GFRAA 71 09/09/2018 0932   Lab Results  Component Value Date   HGBA1C 5.1 09/09/2018   HGBA1C 5.0 04/02/2018   HGBA1C 5.1 11/17/2017   HGBA1C 5.0 11/13/2015   Lab Results  Component Value Date   INSULIN 17.3 09/09/2018   INSULIN 10.1 04/02/2018   INSULIN 8.7 11/17/2017   CBC    Component Value Date/Time   WBC 6.0 10/16/2017 1000   WBC 8.6 11/13/2015 1501   RBC 4.39 10/16/2017 1000    RBC 4.49 11/13/2015  1501   HGB 13.4 10/16/2017 1000   HCT 40.2 10/16/2017 1000   PLT 229 10/16/2017 1000   MCV 92 10/16/2017 1000   MCH 30.5 10/16/2017 1000   MCHC 33.3 10/16/2017 1000   MCHC 34.0 11/13/2015 1501   RDW 13.3 10/16/2017 1000   LYMPHSABS 1.4 10/16/2017 1000   EOSABS 0.2 10/16/2017 1000   BASOSABS 0.0 10/16/2017 1000   Iron/TIBC/Ferritin/ %Sat No results found for: IRON, TIBC, FERRITIN, IRONPCTSAT Lipid Panel     Component Value Date/Time   CHOL 153 09/09/2018 0932   TRIG 160 (H) 09/09/2018 0932   HDL 53 09/09/2018 0932   CHOLHDL 2.6 04/02/2018 0844   CHOLHDL 3 11/13/2015 1501   VLDL 29.8 11/13/2015 1501   LDLCALC 68 09/09/2018 0932   Hepatic Function Panel     Component Value Date/Time   PROT 6.3 09/09/2018 0932   ALBUMIN 4.3 09/09/2018 0932   AST 21 09/09/2018 0932   ALT 19 09/09/2018 0932   ALKPHOS 102 09/09/2018 0932   BILITOT 0.3 09/09/2018 0932      Component Value Date/Time   TSH 1.990 11/17/2017 1158   TSH 2.280 10/16/2017 1000   TSH 2.12 11/13/2015 1501      I, Burt Knack, am acting as transcriptionist for Debbra Riding, MD  I have reviewed the above documentation for accuracy and completeness, and I agree with the above. - Debbra Riding, MD

## 2019-02-23 ENCOUNTER — Other Ambulatory Visit: Payer: Self-pay

## 2019-02-23 ENCOUNTER — Encounter (INDEPENDENT_AMBULATORY_CARE_PROVIDER_SITE_OTHER): Payer: Self-pay | Admitting: Family Medicine

## 2019-02-23 ENCOUNTER — Ambulatory Visit (INDEPENDENT_AMBULATORY_CARE_PROVIDER_SITE_OTHER): Payer: Managed Care, Other (non HMO) | Admitting: Family Medicine

## 2019-02-23 DIAGNOSIS — E7849 Other hyperlipidemia: Secondary | ICD-10-CM | POA: Diagnosis not present

## 2019-02-23 DIAGNOSIS — Z6837 Body mass index (BMI) 37.0-37.9, adult: Secondary | ICD-10-CM | POA: Diagnosis not present

## 2019-02-23 DIAGNOSIS — E559 Vitamin D deficiency, unspecified: Secondary | ICD-10-CM

## 2019-02-23 MED ORDER — VITAMIN D (ERGOCALCIFEROL) 1.25 MG (50000 UNIT) PO CAPS: 50000.0000 [IU] | ORAL_CAPSULE | ORAL | 0 refills | Status: DC

## 2019-02-24 NOTE — Progress Notes (Signed)
Office: [phone]  /  Fax: [phone] TeleHealth Visit:  Donna Vasquez has verbally consented to this TeleHealth visit today. The patient is located at home, the provider is located at the [facility] office. The participants in this visit include the listed provider and patient. The visit was conducted today via face time.  HPI:   Chief Complaint: OBESITY Donna Vasquez is here to discuss her progress with her obesity treatment plan. She is on the Category 2 plan and is following her eating plan approximately 70-80 % of the time. She states she is exercising 0 minutes 0 times per week. Donna Vasquez reports that she is still eating less than she knows she needs to, but hasn't been hungry. For breakfast she is doing yogurt and protein bars, carrots for snack, and Healthy Choice for lunch or a couple of [place] hotdogs.  We were unable to weigh the patient today for this TeleHealth visit. She feels as if she has maintained her weight since her last visit. She has lost 1 lb since starting treatment with [name].  Vitamin D Deficiency Donna Vasquez has a diagnosis of vitamin D deficiency. She is currently taking prescription Vit D. She notes fatigue and denies nausea, vomiting or muscle weakness.  Hyperlipidemia Donna Vasquez has hyperlipidemia and has been trying to improve her cholesterol levels with intensive lifestyle modification including a low saturated fat diet, exercise and weight loss. Last triglycerides was of 160 and LDL of 68. She denies any chest pain, claudication or myalgias.  ASSESSMENT AND PLAN:  Vitamin D deficiency - Plan: Vitamin D, Ergocalciferol, (DRISDOL) 1.25 MG (50000 UT) CAPS capsule  Other hyperlipidemia  Class 2 severe obesity with serious comorbidity and body mass index (BMI) of 37.0 to 37.9 in adult, unspecified obesity type (Donna Vasquez)  PLAN:  Vitamin D Deficiency Donna Vasquez was informed that low vitamin D levels contributes to fatigue and are associated with obesity,  breast, and colon cancer. Donna Vasquez agrees to continue taking prescription Vit D 50,000 IU every week #4 and we will refill for 1 month. She will follow up for routine testing of vitamin D, at least 2-3 times per year. She was informed of the risk of over-replacement of vitamin D and agrees to not increase her dose unless she discusses this with [name] first. Donna Vasquez agrees to follow up with our clinic in 2 weeks.  Hyperlipidemia Donna Vasquez was informed of the American Heart Association Guidelines emphasizing intensive lifestyle modifications as the first line treatment for hyperlipidemia. We discussed many lifestyle modifications today in depth, and Donna Vasquez will continue to work on decreasing saturated fats such as fatty red meat, butter and many fried foods. She will also increase vegetables and lean protein in her diet and continue to work on exercise and weight loss efforts. We will repeat labs at her next appointment.  Obesity Donna Vasquez is currently in the action stage of change. As such, her goal is to continue with weight loss efforts She has agreed to follow the Category 2 plan Donna Vasquez has been instructed to work up to a goal of 150 minutes of combined cardio and strengthening exercise per week for weight loss and overall health benefits. We discussed the following Behavioral Modification Strategies today: increasing lean protein intake, increasing vegetables and work on meal planning and easy cooking plans, keeping healthy foods in the home, and planning for success   Donna Vasquez has agreed to follow up with our clinic in 2 weeks with Dr. Owens Shark. She was informed of the importance of frequent follow  up visits to maximize her success with intensive lifestyle modifications for her multiple health conditions.  ALLERGIES: Allergies  Allergen Reactions  . Flonase [Fluticasone Propionate] Other (See Comments)    Nasal irritation, burning, but can tolerate advair inhaler.    . Sulfa Antibiotics Hives     MEDICATIONS: Current Outpatient Medications on File Prior to Visit  Medication Sig Dispense Refill  . albuterol (PROAIR HFA) 108 (90 Base) MCG/ACT inhaler Inhale 1-2 puffs into the lungs every 6 (six) hours as needed for wheezing or shortness of breath. 1 Inhaler 0  . aspirin 81 MG tablet Take 81 mg by mouth daily.    [name]. aspirin-acetaminophen-caffeine (EXCEDRIN MIGRAINE) 250-250-65 MG tablet Take by mouth every 6 (six) hours as needed for headache.    [name]. buPROPion (WELLBUTRIN XL) 300 MG 24 hr tablet Take 1 tablet (300 mg total) by mouth daily. 30 tablet 0  . dextromethorphan-guaiFENesin (MUCINEX DM) 30-600 MG 12hr tablet Take 1 tablet by mouth 2 (two) times daily as needed for cough.    . Fluticasone-Salmeterol (ADVAIR) 100-50 MCG/DOSE AEPB Inhale 1 puff into the lungs 2 (two) times daily.    [name]. losartan (COZAAR) 25 MG tablet Take 0.5 tablets (12.5 mg total) by mouth daily. 30 tablet 0  . montelukast (SINGULAIR) 10 MG tablet TAKE 1 TABLET (10 MG TOTAL) BY MOUTH AT BEDTIME. 30 tablet 0  . niacin (NIASPAN) 1000 MG CR tablet TAKE 1 TABLET BY MOUTH AT  BEDTIME 90 tablet 2  . omeprazole (PRILOSEC) 20 MG capsule Take 1 capsule (20 mg total) by mouth daily. 30 capsule 2  . sertraline (ZOLOFT) 100 MG tablet TAKE 1 TABLET BY MOUTH  DAILY 90 tablet 2  . simvastatin (ZOCOR) 20 MG tablet TAKE 1 TABLET BY MOUTH  DAILY AT 6 PM. 90 tablet 2  . traZODone (DESYREL) 50 MG tablet Take 0.5-1 tablets (25-50 mg total) by mouth at bedtime as needed for sleep. 90 tablet 3  . triamcinolone cream (KENALOG) 0.1 % Apply 1 application topically 2 (two) times daily as needed. 45 g 1   No current facility-administered medications on file prior to visit.     PAST MEDICAL HISTORY: Past Medical History:  Diagnosis Date  . Allergy   . Asthma   . Back pain   . Bulging lumbar disc   . Chicken pox   . Constipation   . Depression   . Fatigue   . Frequent headaches   . Gall bladder disease   . Headache   . Heartburn   .  Hyperlipidemia   . IBS (irritable bowel syndrome)   . Interstitial cystitis   . RA (rheumatoid arthritis) (HCC)   . Shortness of breath on exertion   . Trouble in sleeping   . Urine incontinence   . Vertigo   . Vision changes   . Vitamin D deficiency     PAST SURGICAL HISTORY: Past Surgical History:  Procedure Laterality Date  . CHOLECYSTECTOMY    . cyst removed from back    . NASAL SINUS SURGERY    . TUBAL LIGATION  1999    SOCIAL HISTORY: Social History   Tobacco Use  . Smoking status: Never Smoker  . Smokeless tobacco: Never Used  Substance Use Topics  . Alcohol use: Yes    Alcohol/week: 0.0 standard drinks    Comment: rare--wine  . Drug use: No    FAMILY HISTORY: Family History  Problem Relation Age of Onset  . COPD Mother   . Heart  disease Mother   . Polymyalgia rheumatica Mother   . Emphysema Mother   . Stroke Mother   . Depression Mother   . Anxiety disorder Mother   . Heart disease Father   . Hypertension Father   . Hyperlipidemia Father   . Anxiety disorder Father   . Cancer Paternal Aunt        Breast  . Breast cancer Paternal Aunt 47  . Stroke Maternal Grandmother   . Cancer Maternal Aunt     ROS: Review of Systems  Constitutional: Positive for malaise/fatigue. Negative for weight loss.  Cardiovascular: Negative for chest pain and claudication.  Gastrointestinal: Negative for nausea and vomiting.  Musculoskeletal: Negative for myalgias.       Negative muscle weakness    PHYSICAL EXAM: Pt in no acute distress  RECENT LABS AND TESTS: BMET    Component Value Date/Time   NA 144 09/09/2018 0932   K 4.0 09/09/2018 0932   CL 104 09/09/2018 0932   CO2 24 09/09/2018 0932   GLUCOSE 89 09/09/2018 0932   GLUCOSE 93 11/13/2015 1501   BUN 14 09/09/2018 0932   CREATININE 1.04 (H) 09/09/2018 0932   CALCIUM 9.3 09/09/2018 0932   GFRNONAA 61 09/09/2018 0932   GFRAA 71 09/09/2018 0932   Lab Results  Component Value Date   HGBA1C 5.1  09/09/2018   HGBA1C 5.0 04/02/2018   HGBA1C 5.1 11/17/2017   HGBA1C 5.0 11/13/2015   Lab Results  Component Value Date   INSULIN 17.3 09/09/2018   INSULIN 10.1 04/02/2018   INSULIN 8.7 11/17/2017   CBC    Component Value Date/Time   WBC 6.0 10/16/2017 1000   WBC 8.6 11/13/2015 1501   RBC 4.39 10/16/2017 1000   RBC 4.49 11/13/2015 1501   HGB 13.4 10/16/2017 1000   HCT 40.2 10/16/2017 1000   PLT 229 10/16/2017 1000   MCV 92 10/16/2017 1000   MCH 30.5 10/16/2017 1000   MCHC 33.3 10/16/2017 1000   MCHC 34.0 11/13/2015 1501   RDW 13.3 10/16/2017 1000   LYMPHSABS 1.4 10/16/2017 1000   EOSABS 0.2 10/16/2017 1000   BASOSABS 0.0 10/16/2017 1000   Iron/TIBC/Ferritin/ %Sat No results found for: IRON, TIBC, FERRITIN, IRONPCTSAT Lipid Panel     Component Value Date/Time   CHOL 153 09/09/2018 0932   TRIG 160 (H) 09/09/2018 0932   HDL 53 09/09/2018 0932   CHOLHDL 2.6 04/02/2018 0844   CHOLHDL 3 11/13/2015 1501   VLDL 29.8 11/13/2015 1501   LDLCALC 68 09/09/2018 0932   Hepatic Function Panel     Component Value Date/Time   PROT 6.3 09/09/2018 0932   ALBUMIN 4.3 09/09/2018 0932   AST 21 09/09/2018 0932   ALT 19 09/09/2018 0932   ALKPHOS 102 09/09/2018 0932   BILITOT 0.3 09/09/2018 0932      Component Value Date/Time   TSH 1.990 11/17/2017 1158   TSH 2.280 10/16/2017 1000   TSH 2.12 11/13/2015 1501      I, Burt Knack, am acting as transcriptionist for Debbra Riding, MD  I have reviewed the above documentation for accuracy and completeness, and I agree with the above. - Debbra Riding, MD

## 2019-03-09 ENCOUNTER — Other Ambulatory Visit (INDEPENDENT_AMBULATORY_CARE_PROVIDER_SITE_OTHER): Payer: Self-pay | Admitting: Family Medicine

## 2019-03-09 ENCOUNTER — Other Ambulatory Visit: Payer: Self-pay

## 2019-03-09 ENCOUNTER — Telehealth (INDEPENDENT_AMBULATORY_CARE_PROVIDER_SITE_OTHER): Payer: Managed Care, Other (non HMO) | Admitting: Bariatrics

## 2019-03-09 ENCOUNTER — Encounter (INDEPENDENT_AMBULATORY_CARE_PROVIDER_SITE_OTHER): Payer: Self-pay | Admitting: Bariatrics

## 2019-03-09 DIAGNOSIS — E559 Vitamin D deficiency, unspecified: Secondary | ICD-10-CM | POA: Diagnosis not present

## 2019-03-09 DIAGNOSIS — Z6837 Body mass index (BMI) 37.0-37.9, adult: Secondary | ICD-10-CM | POA: Diagnosis not present

## 2019-03-09 DIAGNOSIS — E7849 Other hyperlipidemia: Secondary | ICD-10-CM | POA: Diagnosis not present

## 2019-03-10 NOTE — Progress Notes (Signed)
Office: [phone]  /  Fax: [phone] TeleHealth Visit:  Donna Vasquez has verbally consented to this TeleHealth visit today. The patient is located at home, the provider is located at the [facility] office. The participants in this visit include the listed provider and patient. The visit was conducted today via FaceTime.  HPI:   Chief Complaint: OBESITY Donna Vasquez is here to discuss her progress with her obesity treatment plan. She is on the Category 2 plan and is following her eating plan approximately 70% of the time. She states she is walking 15 minutes 5 times per week. Donna Vasquez states that she has gained since her last visit. She is working from home. She normally sees Dr. Adair Patter and this is her first visit with me. We were unable to weigh the patient today for this TeleHealth visit. She feels as if she has gained weight since her last visit. She has lost 1 lb since starting treatment with [name].  Hyperlipidemia Donna Vasquez has hyperlipidemia and has been trying to improve her cholesterol levels with intensive lifestyle modification including a low saturated fat diet, exercise and weight loss. She is taking Zocor and denies any Donna Vasquez.  Vitamin D deficiency Donna Vasquez has a diagnosis of Vitamin D deficiency. Last Vitamin D 31.5 on 09/09/2018. She is currently taking Vit D and denies nausea, vomiting or muscle weakness.  ASSESSMENT AND PLAN:  Other hyperlipidemia  Vitamin D deficiency  Class 2 severe obesity with serious comorbidity and body mass index (BMI) of 37.0 to 37.9 in adult, unspecified obesity type ([place])  PLAN:  Hyperlipidemia Donna Vasquez was informed of the American Heart Association Guidelines emphasizing intensive lifestyle modifications as the first line treatment for hyperlipidemia. We discussed many lifestyle modifications today in depth, and Donna Vasquez will continue to work on decreasing saturated fats such as fatty red meat, butter and many fried foods.  Christeen will continue her medication. She will also increase vegetables and lean protein in her diet and continue to work on exercise and weight loss efforts.  Vitamin D Deficiency Donna Vasquez was informed that low Vitamin D levels contributes to fatigue and are associated with obesity, breast, and colon cancer. She agrees to continue taking Vit D and will follow-up for routine testing of Vitamin D, at least 2-3 times per year. She was informed of the risk of over-replacement of Vitamin D and agrees to not increase her dose unless she discusses this with [name] first. Donna Vasquez agrees to follow-up with our clinic in 2 weeks.  Obesity Donna Vasquez is currently in the action stage of change. As such, her goal is to continue with weight loss efforts. She has agreed to follow the Category 2 plan. Donna Vasquez will work on meal planning, intentional eating, and will pack her lunch even if at home. Donna Vasquez has been instructed to consider exercises on [employer] (walking with Donna Vasquez) for weight loss and overall health benefits. We discussed the following Behavioral Modification Strategies today: increasing lean protein intake, decreasing simple carbohydrates, increasing vegetables, increase H20 intake, decrease eating out, no skipping meals, work on meal planning and easy cooking plans, and keeping healthy foods in the home.  Donna Vasquez has agreed to follow-up with our clinic in 2 weeks. She was informed of the importance of frequent follow-up visits to maximize her success with intensive lifestyle modifications for her multiple health conditions.  ALLERGIES: Allergies  Allergen Reactions   Flonase [Fluticasone Propionate] Other (See Comments)    Nasal irritation, burning, but can tolerate advair inhaler.  Sulfa Antibiotics Hives    MEDICATIONS: Current Outpatient Medications on File Prior to Visit  Medication Sig Dispense Refill   albuterol (PROAIR HFA) 108 (90 Base) MCG/ACT inhaler Inhale 1-2 puffs into the lungs  every 6 (six) hours as needed for wheezing or shortness of breath. 1 Inhaler 0   aspirin 81 MG tablet Take 81 mg by mouth daily.     aspirin-acetaminophen-caffeine (EXCEDRIN MIGRAINE) 250-250-65 MG tablet Take by mouth every 6 (six) hours as needed for headache.     buPROPion (WELLBUTRIN XL) 300 MG 24 hr tablet Take 1 tablet (300 mg total) by mouth daily. 30 tablet 0   dextromethorphan-guaiFENesin (MUCINEX DM) 30-600 MG 12hr tablet Take 1 tablet by mouth 2 (two) times daily as needed for cough.     Fluticasone-Salmeterol (ADVAIR) 100-50 MCG/DOSE AEPB Inhale 1 puff into the lungs 2 (two) times daily.     losartan (COZAAR) 25 MG tablet Take 0.5 tablets (12.5 mg total) by mouth daily. 30 tablet 0   montelukast (SINGULAIR) 10 MG tablet TAKE 1 TABLET (10 MG TOTAL) BY MOUTH AT BEDTIME. 30 tablet 0   niacin (NIASPAN) 1000 MG CR tablet TAKE 1 TABLET BY MOUTH AT  BEDTIME 90 tablet 2   omeprazole (PRILOSEC) 20 MG capsule Take 1 capsule (20 mg total) by mouth daily. 30 capsule 2   sertraline (ZOLOFT) 100 MG tablet TAKE 1 TABLET BY MOUTH  DAILY 90 tablet 2   simvastatin (ZOCOR) 20 MG tablet TAKE 1 TABLET BY MOUTH  DAILY AT 6 PM. 90 tablet 2   traZODone (DESYREL) 50 MG tablet Take 0.5-1 tablets (25-50 mg total) by mouth at bedtime as needed for sleep. 90 tablet 3   triamcinolone cream (KENALOG) 0.1 % Apply 1 application topically 2 (two) times daily as needed. 45 g 1   Vitamin D, Ergocalciferol, (DRISDOL) 1.25 MG (50000 UT) CAPS capsule Take 1 capsule (50,000 Units total) by mouth every 7 (seven) days. 12 capsule 0   No current facility-administered medications on file prior to visit.     PAST MEDICAL HISTORY: Past Medical History:  Diagnosis Date   Allergy    Asthma    Back pain    Bulging lumbar disc    Chicken pox    Constipation    Depression    Fatigue    Frequent headaches    Gall bladder disease    Headache    Heartburn    Hyperlipidemia    IBS (irritable  bowel syndrome)    Interstitial cystitis    RA (rheumatoid arthritis) (HCC)    Shortness of breath on exertion    Trouble in sleeping    Urine incontinence    Vertigo    Vision changes    Vitamin D deficiency     PAST SURGICAL HISTORY: Past Surgical History:  Procedure Laterality Date   CHOLECYSTECTOMY     cyst removed from back     NASAL SINUS SURGERY     TUBAL LIGATION  1999    SOCIAL HISTORY: Social History   Tobacco Use   Smoking status: Never Smoker   Smokeless tobacco: Never Used  Substance Use Topics   Alcohol use: Yes    Alcohol/week: 0.0 standard drinks    Comment: rare--wine   Drug use: No    FAMILY HISTORY: Family History  Problem Relation Age of Onset   COPD Mother    Heart disease Mother    Polymyalgia rheumatica Mother    Emphysema Mother    Stroke  Mother    Depression Mother    Anxiety disorder Mother    Heart disease Father    Hypertension Father    Hyperlipidemia Father    Anxiety disorder Father    Cancer Paternal Aunt        Breast   Breast cancer Paternal Aunt 60   Stroke Maternal Grandmother    Cancer Maternal Aunt    ROS: Review of Systems  Gastrointestinal: Negative for nausea and vomiting.  Musculoskeletal: Negative for Donna Vasquez.       Negative for muscle weakness.   PHYSICAL EXAM: Pt in no acute distress  RECENT LABS AND TESTS: BMET    Component Value Date/Time   NA 144 09/09/2018 0932   K 4.0 09/09/2018 0932   CL 104 09/09/2018 0932   CO2 24 09/09/2018 0932   GLUCOSE 89 09/09/2018 0932   GLUCOSE 93 11/13/2015 1501   BUN 14 09/09/2018 0932   CREATININE 1.04 (H) 09/09/2018 0932   CALCIUM 9.3 09/09/2018 0932   GFRNONAA 61 09/09/2018 0932   GFRAA 71 09/09/2018 0932   Lab Results  Component Value Date   HGBA1C 5.1 09/09/2018   HGBA1C 5.0 04/02/2018   HGBA1C 5.1 11/17/2017   HGBA1C 5.0 11/13/2015   Lab Results  Component Value Date   INSULIN 17.3 09/09/2018   INSULIN 10.1  04/02/2018   INSULIN 8.7 11/17/2017   CBC    Component Value Date/Time   WBC 6.0 10/16/2017 1000   WBC 8.6 11/13/2015 1501   RBC 4.39 10/16/2017 1000   RBC 4.49 11/13/2015 1501   HGB 13.4 10/16/2017 1000   HCT 40.2 10/16/2017 1000   PLT 229 10/16/2017 1000   MCV 92 10/16/2017 1000   MCH 30.5 10/16/2017 1000   MCHC 33.3 10/16/2017 1000   MCHC 34.0 11/13/2015 1501   RDW 13.3 10/16/2017 1000   LYMPHSABS 1.4 10/16/2017 1000   EOSABS 0.2 10/16/2017 1000   BASOSABS 0.0 10/16/2017 1000   Iron/TIBC/Ferritin/ %Sat No results found for: IRON, TIBC, FERRITIN, IRONPCTSAT Lipid Panel     Component Value Date/Time   CHOL 153 09/09/2018 0932   TRIG 160 (H) 09/09/2018 0932   HDL 53 09/09/2018 0932   CHOLHDL 2.6 04/02/2018 0844   CHOLHDL 3 11/13/2015 1501   VLDL 29.8 11/13/2015 1501   LDLCALC 68 09/09/2018 0932   Hepatic Function Panel     Component Value Date/Time   PROT 6.3 09/09/2018 0932   ALBUMIN 4.3 09/09/2018 0932   AST 21 09/09/2018 0932   ALT 19 09/09/2018 0932   ALKPHOS 102 09/09/2018 0932   BILITOT 0.3 09/09/2018 0932      Component Value Date/Time   TSH 1.990 11/17/2017 1158   TSH 2.280 10/16/2017 1000   TSH 2.12 11/13/2015 1501   Results for [facility], Jerrie R "KATIE" (MRN [id number]) as of 03/10/2019 15:21  Ref. Range 09/09/2018 09:32  Vitamin D, 25-Hydroxy Latest Ref Range: 30.0 - 100.0 ng/mL 31.5   I, Marianna Payment, am acting as Energy manager for [employer], DO   I have reviewed the above documentation for accuracy and completeness, and I agree with the above. -Corinna Capra, DO

## 2019-03-30 ENCOUNTER — Ambulatory Visit (INDEPENDENT_AMBULATORY_CARE_PROVIDER_SITE_OTHER): Payer: Managed Care, Other (non HMO) | Admitting: Bariatrics

## 2019-03-30 ENCOUNTER — Other Ambulatory Visit: Payer: Self-pay

## 2019-03-30 ENCOUNTER — Encounter (INDEPENDENT_AMBULATORY_CARE_PROVIDER_SITE_OTHER): Payer: Self-pay | Admitting: Bariatrics

## 2019-03-30 DIAGNOSIS — E559 Vitamin D deficiency, unspecified: Secondary | ICD-10-CM | POA: Diagnosis not present

## 2019-03-30 DIAGNOSIS — E785 Hyperlipidemia, unspecified: Secondary | ICD-10-CM | POA: Diagnosis not present

## 2019-03-30 DIAGNOSIS — I10 Essential (primary) hypertension: Secondary | ICD-10-CM | POA: Diagnosis not present

## 2019-03-30 DIAGNOSIS — Z6835 Body mass index (BMI) 35.0-35.9, adult: Secondary | ICD-10-CM

## 2019-03-30 MED ORDER — LOSARTAN POTASSIUM 25 MG PO TABS: 12.5000 mg | ORAL_TABLET | Freq: Every day | ORAL | 0 refills | Status: DC

## 2019-03-30 MED ORDER — VITAMIN D (ERGOCALCIFEROL) 1.25 MG (50000 UNIT) PO CAPS: 50000.0000 [IU] | ORAL_CAPSULE | ORAL | 0 refills | Status: DC

## 2019-03-31 NOTE — Progress Notes (Signed)
Office: [phone]  /  Fax: [phone] TeleHealth Visit:  Donna Vasquez has verbally consented to this TeleHealth visit today. The patient is located at home, the provider is located at the [facility]. The participants in this visit include the listed provider and patient. The visit was conducted today via face time.  HPI:   Chief Complaint: OBESITY Donna Vasquez is here to discuss her progress with her obesity treatment plan. She is on the Category 2 plan and is following her eating plan approximately 80-85 % of the time. She states she has been much more active. Donna Vasquez states that she has lost 1 lb. She is a patient of Dr. Mickey FarberKadolph's, and this is her 2nd visit with me. She struggles with sweets, but she is doing better.  We were unable to weigh the patient today for this TeleHealth visit. She feels as if she has lost 1 lb since her last visit. She has lost 1-2 lbs since starting treatment with us.  Hyperlipidemia Donna Vasquez has hyperlipidemia and has been trying to improve her cholesterol levels with intensive lifestyle modification including a low saturated fat diet, exercise and weight loss. She is taking Zocor and denies any chest pain, claudication or myalgias.  Vitamin D Deficiency Donna Vasquez has a diagnosis of vitamin D deficiency. She is currently taking vit D and denies nausea, vomiting or muscle weakness.  Hypertension Donna Vasquez is a 53 y.o. female with hypertension. Donna Vasquez blood pressure is well controlled. She denies chest pain. She is working on weight loss to help control her blood pressure with the goal of decreasing her risk of heart attack and stroke.   ASSESSMENT AND PLAN:  Vitamin D deficiency - Plan: Vitamin D, Ergocalciferol, (DRISDOL) 1.25 MG (50000 UT) CAPS capsule  Hyperlipidemia, unspecified hyperlipidemia type  Essential hypertension  Class 2 severe obesity with serious comorbidity and body mass index (BMI) of 35.0 to 35.9 in  adult, unspecified obesity type (HCC)  PLAN:  Hyperlipidemia Donna Vasquez was informed of the American Heart Association Guidelines emphasizing intensive lifestyle modifications as the first line treatment for hyperlipidemia. We discussed many lifestyle modifications today in depth, and Donna Vasquez will continue to work on decreasing saturated fats such as fatty red meat, butter and many fried foods. She will also increase vegetables and lean protein in her diet and continue to work on exercise and weight loss efforts. Donna Vasquez agrees to continue her medications, and she agrees to follow up with our clinic in 2 to 3 weeks.  Vitamin D Deficiency Donna Vasquez was informed that low vitamin D levels contributes to fatigue and are associated with obesity, breast, and colon cancer. Donna Vasquez agrees to continue taking prescription Vit D 50,000 IU every week #5 and we will refill for 1 month. She will follow up for routine testing of vitamin D, at least 2-3 times per year. She was informed of the risk of over-replacement of vitamin D and agrees to not increase her dose unless she discusses this with us first. Donna Vasquez agrees to follow up with our clinic in 2 to 3 weeks.  Hypertension We discussed sodium restriction, working on healthy weight loss, and a regular exercise program as the means to achieve improved blood pressure control. Donna Vasquez agreed with this plan and agreed to follow up as directed. We will continue to monitor her blood pressure as well as her progress with the above lifestyle modifications. Donna Vasquez agrees to continue taking losartan 25 mg 1/2 tablet PO daily #30 and we will refill for  1 month. She will watch for signs of hypotension as she continues her lifestyle modifications. Donna Vasquez agrees to follow up with our clinic in 2 to 3 weeks.  Obesity Donna Vasquez is currently in the action stage of change. As such, her goal is to continue with weight loss efforts She has agreed to follow the Category 2 plan Donna Vasquez  has been instructed to work up to a goal of 150 minutes of combined cardio and strengthening exercise per week or continue being more active for weight loss and overall health benefits. We discussed the following Behavioral Modification Strategies today: increasing lean protein intake, decreasing simple carbohydrates , increasing vegetables, increasing fiber rich foods, emotional eating strategies and avoiding temptations, increase H20 intake, better snacking choices, and planning for success Donna Vasquez is to increase her water intake to 64 oz daily.  Donna Vasquez has agreed to follow up with our clinic in 2 to 3 weeks. She was informed of the importance of frequent follow up visits to maximize her success with intensive lifestyle modifications for her multiple health conditions.  ALLERGIES: Allergies  Allergen Reactions  . Flonase [Fluticasone Propionate] Other (See Comments)    Nasal irritation, burning, but can tolerate advair inhaler.    . Sulfa Antibiotics Hives    MEDICATIONS: Current Outpatient Medications on File Prior to Visit  Medication Sig Dispense Refill  . albuterol (PROAIR HFA) 108 (90 Base) MCG/ACT inhaler Inhale 1-2 puffs into the lungs every 6 (six) hours as needed for wheezing or shortness of breath. 1 Inhaler 0  . aspirin 81 MG tablet Take 81 mg by mouth daily.    [name] aspirin-acetaminophen-caffeine (EXCEDRIN MIGRAINE) 250-250-65 MG tablet Take by mouth every 6 (six) hours as needed for headache.    [name] buPROPion (WELLBUTRIN XL) 300 MG 24 hr tablet Take 1 tablet (300 mg total) by mouth daily. 30 tablet 0  . dextromethorphan-guaiFENesin (MUCINEX DM) 30-600 MG 12hr tablet Take 1 tablet by mouth 2 (two) times daily as needed for cough.    . Fluticasone-Salmeterol (ADVAIR) 100-50 MCG/DOSE AEPB Inhale 1 puff into the lungs 2 (two) times daily.    . montelukast (SINGULAIR) 10 MG tablet TAKE 1 TABLET (10 MG TOTAL) BY MOUTH AT BEDTIME. 30 tablet 0  . niacin (NIASPAN) 1000 MG CR tablet TAKE 1  TABLET BY MOUTH AT  BEDTIME 90 tablet 2  . omeprazole (PRILOSEC) 20 MG capsule Take 1 capsule (20 mg total) by mouth daily. 30 capsule 2  . sertraline (ZOLOFT) 100 MG tablet TAKE 1 TABLET BY MOUTH  DAILY 90 tablet 2  . simvastatin (ZOCOR) 20 MG tablet TAKE 1 TABLET BY MOUTH  DAILY AT 6 PM. 90 tablet 2  . traZODone (DESYREL) 50 MG tablet Take 0.5-1 tablets (25-50 mg total) by mouth at bedtime as needed for sleep. 90 tablet 3  . triamcinolone cream (KENALOG) 0.1 % Apply 1 application topically 2 (two) times daily as needed. 45 g 1   No current facility-administered medications on file prior to visit.     PAST MEDICAL HISTORY: Past Medical History:  Diagnosis Date  . Allergy   . Asthma   . Back pain   . Bulging lumbar disc   . Chicken pox   . Constipation   . Depression   . Fatigue   . Frequent headaches   . Gall bladder disease   . Headache   . Heartburn   . Hyperlipidemia   . IBS (irritable bowel syndrome)   . Interstitial cystitis   . RA (rheumatoid  arthritis) ([place])   . Shortness of breath on exertion   . Trouble in sleeping   . Urine incontinence   . Vertigo   . Vision changes   . Vitamin D deficiency     PAST SURGICAL HISTORY: Past Surgical History:  Procedure Laterality Date  . CHOLECYSTECTOMY    . cyst removed from back    . NASAL SINUS SURGERY    . TUBAL LIGATION  1999    SOCIAL HISTORY: Social History   Tobacco Use  . Smoking status: Never Smoker  . Smokeless tobacco: Never Used  Substance Use Topics  . Alcohol use: Yes    Alcohol/week: 0.0 standard drinks    Comment: rare--wine  . Drug use: No    FAMILY HISTORY: Family History  Problem Relation Age of Onset  . COPD Mother   . Heart disease Mother   . Polymyalgia rheumatica Mother   . Emphysema Mother   . Stroke Mother   . Depression Mother   . Anxiety disorder Mother   . Heart disease Father   . Hypertension Father   . Hyperlipidemia Father   . Anxiety disorder Father   . Cancer Paternal  Aunt        Breast  . Breast cancer Paternal Aunt 50  . Stroke Maternal Grandmother   . Cancer Maternal Aunt     ROS: Review of Systems  Constitutional: Positive for weight loss.  Cardiovascular: Negative for chest pain and claudication.  Gastrointestinal: Negative for nausea and vomiting.  Musculoskeletal: Negative for myalgias.       Negative muscle weakness    PHYSICAL EXAM: Pt in no acute distress  RECENT LABS AND TESTS: BMET    Component Value Date/Time   NA 144 09/09/2018 0932   K 4.0 09/09/2018 0932   CL 104 09/09/2018 0932   CO2 24 09/09/2018 0932   GLUCOSE 89 09/09/2018 0932   GLUCOSE 93 11/13/2015 1501   BUN 14 09/09/2018 0932   CREATININE 1.04 (H) 09/09/2018 0932   CALCIUM 9.3 09/09/2018 0932   GFRNONAA 61 09/09/2018 0932   GFRAA 71 09/09/2018 0932   Lab Results  Component Value Date   HGBA1C 5.1 09/09/2018   HGBA1C 5.0 04/02/2018   HGBA1C 5.1 11/17/2017   HGBA1C 5.0 11/13/2015   Lab Results  Component Value Date   INSULIN 17.3 09/09/2018   INSULIN 10.1 04/02/2018   INSULIN 8.7 11/17/2017   CBC    Component Value Date/Time   WBC 6.0 10/16/2017 1000   WBC 8.6 11/13/2015 1501   RBC 4.39 10/16/2017 1000   RBC 4.49 11/13/2015 1501   HGB 13.4 10/16/2017 1000   HCT 40.2 10/16/2017 1000   PLT 229 10/16/2017 1000   MCV 92 10/16/2017 1000   MCH 30.5 10/16/2017 1000   MCHC 33.3 10/16/2017 1000   MCHC 34.0 11/13/2015 1501   RDW 13.3 10/16/2017 1000   LYMPHSABS 1.4 10/16/2017 1000   EOSABS 0.2 10/16/2017 1000   BASOSABS 0.0 10/16/2017 1000   Iron/TIBC/Ferritin/ %Sat No results found for: IRON, TIBC, FERRITIN, IRONPCTSAT Lipid Panel     Component Value Date/Time   CHOL 153 09/09/2018 0932   TRIG 160 (H) 09/09/2018 0932   HDL 53 09/09/2018 0932   CHOLHDL 2.6 04/02/2018 0844   CHOLHDL 3 11/13/2015 1501   VLDL 29.8 11/13/2015 1501   LDLCALC 68 09/09/2018 0932   Hepatic Function Panel     Component Value Date/Time   PROT 6.3 09/09/2018 0932    ALBUMIN 4.3 09/09/2018  0932   AST 21 09/09/2018 0932   ALT 19 09/09/2018 0932   ALKPHOS 102 09/09/2018 0932   BILITOT 0.3 09/09/2018 0932      Component Value Date/Time   TSH 1.990 11/17/2017 1158   TSH 2.280 10/16/2017 1000   TSH 2.12 11/13/2015 1501      I, Burt KnackSharon Martin, am acting as transcriptionist for [employer] , DO  I have reviewed the above documentation for accuracy and completeness, and I agree with the above. -Corinna CapraAngel , DO

## 2019-04-01 ENCOUNTER — Encounter (INDEPENDENT_AMBULATORY_CARE_PROVIDER_SITE_OTHER): Payer: Self-pay | Admitting: Bariatrics

## 2019-04-11 ENCOUNTER — Other Ambulatory Visit: Payer: Self-pay | Admitting: Family Medicine

## 2019-04-13 ENCOUNTER — Other Ambulatory Visit (INDEPENDENT_AMBULATORY_CARE_PROVIDER_SITE_OTHER): Payer: Self-pay | Admitting: Family Medicine

## 2019-04-13 DIAGNOSIS — F339 Major depressive disorder, recurrent, unspecified: Secondary | ICD-10-CM

## 2019-04-21 ENCOUNTER — Encounter (INDEPENDENT_AMBULATORY_CARE_PROVIDER_SITE_OTHER): Payer: Self-pay | Admitting: Bariatrics

## 2019-04-21 ENCOUNTER — Other Ambulatory Visit: Payer: Self-pay

## 2019-04-21 ENCOUNTER — Telehealth (INDEPENDENT_AMBULATORY_CARE_PROVIDER_SITE_OTHER): Payer: Managed Care, Other (non HMO) | Admitting: Bariatrics

## 2019-04-21 DIAGNOSIS — Z6837 Body mass index (BMI) 37.0-37.9, adult: Secondary | ICD-10-CM

## 2019-04-21 DIAGNOSIS — R030 Elevated blood-pressure reading, without diagnosis of hypertension: Secondary | ICD-10-CM

## 2019-04-21 DIAGNOSIS — E66812 Obesity, class 2: Secondary | ICD-10-CM

## 2019-04-21 DIAGNOSIS — E7849 Other hyperlipidemia: Secondary | ICD-10-CM

## 2019-04-21 NOTE — Progress Notes (Signed)
Office: [phone]  /  Fax: [phone] TeleHealth Visit:  Donna Vasquez has verbally consented to this TeleHealth visit today. The patient is located at home, the provider is located at the [facility] office. The participants in this visit include the listed provider and patient. The visit was conducted today via FaceTime.  HPI:   Chief Complaint: OBESITY Donna DeistKathryn is here to discuss her progress with her obesity treatment plan. She is on the Category 2 plan and is following her eating plan approximately 80% of the time. She states she has been more active. Donna DeistKathryn states that her weight is either the same or she has lost (scale does not register high enough). She struggles with meat.  We were unable to weigh the patient today for this TeleHealth visit. She is not sure if she has lost or gained weight since her last visit. She has lost 1 lb since starting treatment with us.  Hyperlipidemia Donna DeistKathryn has hyperlipidemia and has been trying to improve her cholesterol levels with intensive lifestyle modification including a low saturated fat diet, exercise and weight loss. She is taking Zocor denies any myalgias.  Transient Blood Pressure Elevations Donna DeistKathryn has a diagnosis of transient blood pressure elevations. Her blood pressure today is slightly elevated at 128/89.  ASSESSMENT AND PLAN:  Other hyperlipidemia  Transient elevated blood pressure  Class 2 severe obesity with serious comorbidity and body mass index (BMI) of 37.0 to 37.9 in adult, unspecified obesity type (HCC)  PLAN:  Hyperlipidemia Donna DeistKathryn was informed of the American Heart Association Guidelines emphasizing intensive lifestyle modifications as the first line treatment for hyperlipidemia. We discussed many lifestyle modifications today in depth, and Donna DeistKathryn will continue to work on decreasing saturated fats such as fatty red meat, butter and many fried foods. Donna DeistKathryn will continue Zocor. She will  decrease saturated fats, add Omega-3, and increase MUFA's and PUFA's. She will also increase vegetables and lean protein in her diet and continue to work on exercise and weight loss efforts.  Transient Blood Pressure Elevations Donna DeistKathryn will continue her blood pressure medication and follow-up as directed.  Obesity Donna DeistKathryn is currently in the action stage of change. As such, her goal is to continue with weight loss efforts. She has agreed to follow the Category 2 plan. Donna DeistKathryn will work on meal planning, increasing her water, increasing her protein, and will have takeout only once weekly. Donna DeistKathryn has been instructed to continue to be more active (walk and treadmill) for weight loss and overall health benefits. We discussed the following Behavioral Modification Strategies today: increasing lean protein intake, decreasing simple carbohydrates, increasing vegetables, increase H20 intake, decrease eating out, no skipping meals, work on meal planning and easy cooking plans, keeping healthy foods in the home, and avoiding temptations.  Donna DeistKathryn has agreed to follow up with our clinic in 2-3 weeks. She was informed of the importance of frequent follow up visits to maximize her success with intensive lifestyle modifications for her multiple health conditions.  ALLERGIES: Allergies  Allergen Reactions  . Flonase [Fluticasone Propionate] Other (See Comments)    Nasal irritation, burning, but can tolerate advair inhaler.    . Sulfa Antibiotics Hives    MEDICATIONS: Current Outpatient Medications on File Prior to Visit  Medication Sig Dispense Refill  . albuterol (PROAIR HFA) 108 (90 Base) MCG/ACT inhaler Inhale 1-2 puffs into the lungs every 6 (six) hours as needed for wheezing or shortness of breath. 1 Inhaler 0  . aspirin 81 MG tablet Take 81 mg  by mouth daily.    [name] aspirin-acetaminophen-caffeine (EXCEDRIN MIGRAINE) 250-250-65 MG tablet Take by mouth every 6 (six) hours as needed for headache.    [name]  buPROPion (WELLBUTRIN XL) 300 MG 24 hr tablet TAKE 1 TABLET BY MOUTH  DAILY 30 tablet 11  . dextromethorphan-guaiFENesin (MUCINEX DM) 30-600 MG 12hr tablet Take 1 tablet by mouth 2 (two) times daily as needed for cough.    . Fluticasone-Salmeterol (ADVAIR) 100-50 MCG/DOSE AEPB Inhale 1 puff into the lungs 2 (two) times daily.    [name] losartan (COZAAR) 25 MG tablet Take 0.5 tablets (12.5 mg total) by mouth daily. 30 tablet 0  . montelukast (SINGULAIR) 10 MG tablet TAKE 1 TABLET (10 MG TOTAL) BY MOUTH AT BEDTIME. 30 tablet 0  . niacin (NIASPAN) 1000 MG CR tablet TAKE 1 TABLET BY MOUTH AT  BEDTIME 90 tablet 3  . omeprazole (PRILOSEC) 20 MG capsule Take 1 capsule (20 mg total) by mouth daily. 30 capsule 2  . sertraline (ZOLOFT) 100 MG tablet TAKE 1 TABLET BY MOUTH  DAILY 90 tablet 3  . simvastatin (ZOCOR) 20 MG tablet TAKE 1 TABLET BY MOUTH  DAILY AT 6 PM. 90 tablet 3  . traZODone (DESYREL) 50 MG tablet Take 0.5-1 tablets (25-50 mg total) by mouth at bedtime as needed for sleep. 90 tablet 3  . triamcinolone cream (KENALOG) 0.1 % Apply 1 application topically 2 (two) times daily as needed. 45 g 1  . Vitamin D, Ergocalciferol, (DRISDOL) 1.25 MG (50000 UT) CAPS capsule Take 1 capsule (50,000 Units total) by mouth every 7 (seven) days. 5 capsule 0   No current facility-administered medications on file prior to visit.     PAST MEDICAL HISTORY: Past Medical History:  Diagnosis Date  . Allergy   . Asthma   . Back pain   . Bulging lumbar disc   . Chicken pox   . Constipation   . Depression   . Fatigue   . Frequent headaches   . Gall bladder disease   . Headache   . Heartburn   . Hyperlipidemia   . IBS (irritable bowel syndrome)   . Interstitial cystitis   . RA (rheumatoid arthritis) ([place])   . Shortness of breath on exertion   . Trouble in sleeping   . Urine incontinence   . Vertigo   . Vision changes   . Vitamin D deficiency     PAST SURGICAL HISTORY: Past Surgical History:  Procedure  Laterality Date  . CHOLECYSTECTOMY    . cyst removed from back    . NASAL SINUS SURGERY    . TUBAL LIGATION  1999    SOCIAL HISTORY: Social History   Tobacco Use  . Smoking status: Never Smoker  . Smokeless tobacco: Never Used  Substance Use Topics  . Alcohol use: Yes    Alcohol/week: 0.0 standard drinks    Comment: rare--wine  . Drug use: No    FAMILY HISTORY: Family History  Problem Relation Age of Onset  . COPD Mother   . Heart disease Mother   . Polymyalgia rheumatica Mother   . Emphysema Mother   . Stroke Mother   . Depression Mother   . Anxiety disorder Mother   . Heart disease Father   . Hypertension Father   . Hyperlipidemia Father   . Anxiety disorder Father   . Cancer Paternal Aunt        Breast  . Breast cancer Paternal Aunt 33  . Stroke Maternal Grandmother   .  Cancer Maternal Aunt    ROS: Review of Systems  Musculoskeletal: Negative for myalgias.   PHYSICAL EXAM: Pt in no acute distress  RECENT LABS AND TESTS: BMET    Component Value Date/Time   NA 144 09/09/2018 0932   K 4.0 09/09/2018 0932   CL 104 09/09/2018 0932   CO2 24 09/09/2018 0932   GLUCOSE 89 09/09/2018 0932   GLUCOSE 93 11/13/2015 1501   BUN 14 09/09/2018 0932   CREATININE 1.04 (H) 09/09/2018 0932   CALCIUM 9.3 09/09/2018 0932   GFRNONAA 61 09/09/2018 0932   GFRAA 71 09/09/2018 0932   Lab Results  Component Value Date   HGBA1C 5.1 09/09/2018   HGBA1C 5.0 04/02/2018   HGBA1C 5.1 11/17/2017   HGBA1C 5.0 11/13/2015   Lab Results  Component Value Date   INSULIN 17.3 09/09/2018   INSULIN 10.1 04/02/2018   INSULIN 8.7 11/17/2017   CBC    Component Value Date/Time   WBC 6.0 10/16/2017 1000   WBC 8.6 11/13/2015 1501   RBC 4.39 10/16/2017 1000   RBC 4.49 11/13/2015 1501   HGB 13.4 10/16/2017 1000   HCT 40.2 10/16/2017 1000   PLT 229 10/16/2017 1000   MCV 92 10/16/2017 1000   MCH 30.5 10/16/2017 1000   MCHC 33.3 10/16/2017 1000   MCHC 34.0 11/13/2015 1501   RDW  13.3 10/16/2017 1000   LYMPHSABS 1.4 10/16/2017 1000   EOSABS 0.2 10/16/2017 1000   BASOSABS 0.0 10/16/2017 1000   Iron/TIBC/Ferritin/ %Sat No results found for: IRON, TIBC, FERRITIN, IRONPCTSAT Lipid Panel     Component Value Date/Time   CHOL 153 09/09/2018 0932   TRIG 160 (H) 09/09/2018 0932   HDL 53 09/09/2018 0932   CHOLHDL 2.6 04/02/2018 0844   CHOLHDL 3 11/13/2015 1501   VLDL 29.8 11/13/2015 1501   LDLCALC 68 09/09/2018 0932   Hepatic Function Panel     Component Value Date/Time   PROT 6.3 09/09/2018 0932   ALBUMIN 4.3 09/09/2018 0932   AST 21 09/09/2018 0932   ALT 19 09/09/2018 0932   ALKPHOS 102 09/09/2018 0932   BILITOT 0.3 09/09/2018 0932      Component Value Date/Time   TSH 1.990 11/17/2017 1158   TSH 2.280 10/16/2017 1000   TSH 2.12 11/13/2015 1501   Results for [facility], Shawntay R "KATIE" (MRN [id number]) as of 04/21/2019 14:23  Ref. Range 09/09/2018 09:32  Vitamin D, 25-Hydroxy Latest Ref Range: 30.0 - 100.0 ng/mL 31.5   I, Marianna Payment, am acting as Energy manager for [employer], DO  I have reviewed the above documentation for accuracy and completeness, and I agree with the above. -Corinna Capra, DO

## 2019-04-27 ENCOUNTER — Other Ambulatory Visit (INDEPENDENT_AMBULATORY_CARE_PROVIDER_SITE_OTHER): Payer: Self-pay | Admitting: Bariatrics

## 2019-05-10 ENCOUNTER — Telehealth (INDEPENDENT_AMBULATORY_CARE_PROVIDER_SITE_OTHER): Payer: Managed Care, Other (non HMO) | Admitting: Bariatrics

## 2019-05-10 ENCOUNTER — Other Ambulatory Visit: Payer: Self-pay

## 2019-05-10 ENCOUNTER — Encounter (INDEPENDENT_AMBULATORY_CARE_PROVIDER_SITE_OTHER): Payer: Self-pay | Admitting: Bariatrics

## 2019-05-10 DIAGNOSIS — E559 Vitamin D deficiency, unspecified: Secondary | ICD-10-CM | POA: Diagnosis not present

## 2019-05-10 DIAGNOSIS — F3289 Other specified depressive episodes: Secondary | ICD-10-CM

## 2019-05-10 DIAGNOSIS — I10 Essential (primary) hypertension: Secondary | ICD-10-CM

## 2019-05-10 DIAGNOSIS — Z6837 Body mass index (BMI) 37.0-37.9, adult: Secondary | ICD-10-CM

## 2019-05-10 MED ORDER — TROKENDI XR 50 MG PO CP24: 50.0000 mg | ORAL_CAPSULE | Freq: Every day | ORAL | 0 refills | Status: DC

## 2019-05-10 NOTE — Progress Notes (Signed)
Office: [phone]  /  Fax: [phone] TeleHealth Visit:  Donna Vasquez has verbally consented to this TeleHealth visit today. The patient is located at home, the provider is located at the [facility] office. The participants in this visit include the listed provider and patient. The visit was conducted today via FaceTime.  HPI:  Chief Complaint: OBESITY Donna Vasquez is here to discuss her progress with her obesity treatment plan. She is on the Category 2 plan and states she is following her eating plan approximately 70-80% of the time. She states she is exercising 0 minutes 0 times per week.  Donna Vasquez states that her weight remains the same. She is "doing what she can." She is working more and getting less exercise. She is struggling with sweets. She reports getting more water and decreased sodas.  Today's visit was #31 Starting weight: 248 lbs Starting date: 11/17/2017  Hypertension Donna Vasquez has a diagnosis of hypertension, which is reasonably well controlled. She is taking Cozaar.  Vitamin D deficiency Donna Vasquez has a diagnosis of Vitamin D deficiency and is taking Vitamin D. Last Vitamin D was 31.5 on 09/09/2018. No nausea, vomiting, or muscle weakness.  Depression (Emotional Eating Behaviors) Donna Vasquez is taking Trokendi and is having no contraindications.  ASSESSMENT AND PLAN:  Essential hypertension  Vitamin D deficiency  Other depression, with emotional eating   Class 2 severe obesity with serious comorbidity and body mass index (BMI) of 37.0 to 37.9 in adult, unspecified obesity type (HCC)  PLAN:  Hypertension Donna Vasquez is working on healthy weight loss and exercise to improve blood pressure control. She will continue her medications and will not add salt to her diet. We will watch for signs of hypotension as she continues her lifestyle modifications.  Vitamin D Deficiency Donna Vasquez was informed that low Vitamin D levels contributes to fatigue and are  associated with obesity, breast, and colon cancer. She agrees to continue Vit D and will follow-up for routine testing of Vitamin D, at least 2-3 times per year. She was informed of the risk of over-replacement of Vitamin D and agrees to not increase her dose unless she discusses this with [name] first. Donna Vasquez agrees to follow-up with our clinic in 3 weeks.  Emotional Eating Behaviors (other depression) Behavior modification techniques were discussed today to help Donna Vasquez deal with her emotional/non-hunger eating behaviors. Donna Vasquez was given a prescription for Trokendi 50 mg 1 daily #30 with 0 refills ([facility] mail service).  Obesity Donna Vasquez is currently in the action stage of change. As such, her goal is to continue with weight loss efforts. She has agreed to follow the Category 2 plan. Donna Vasquez will work on meal planning, intentional eating, will be more adherent to the plan, will pack her lunch, increase water, and have a lock box for snacks. Donna Vasquez has been instructed to increase exercise for weight loss and overall health benefits. We discussed the following Behavioral Modification Strategies today: increasing lean protein intake, decreasing simple carbohydrates, increasing vegetables, increase H20 intake, decrease eating out, no skipping meals, work on meal planning and easy cooking plans, and keeping healthy foods in the home.  Donna Vasquez has agreed to follow up with our clinic in 3 weeks. She was informed of the importance of frequent follow up visits to maximize her success with intensive lifestyle modifications for her multiple health conditions.  ALLERGIES: Allergies  Allergen Reactions  . Flonase [Fluticasone Propionate] Other (See Comments)    Nasal irritation, burning, but can tolerate advair inhaler.    [name]  Sulfa Antibiotics Hives    MEDICATIONS: Current Outpatient Medications on File Prior to Visit  Medication Sig Dispense Refill  . albuterol (PROAIR HFA) 108 (90 Base) MCG/ACT  inhaler Inhale 1-2 puffs into the lungs every 6 (six) hours as needed for wheezing or shortness of breath. 1 Inhaler 0  . aspirin 81 MG tablet Take 81 mg by mouth daily.    [name] aspirin-acetaminophen-caffeine (EXCEDRIN MIGRAINE) 250-250-65 MG tablet Take by mouth every 6 (six) hours as needed for headache.    [name] buPROPion (WELLBUTRIN XL) 300 MG 24 hr tablet TAKE 1 TABLET BY MOUTH  DAILY 30 tablet 11  . dextromethorphan-guaiFENesin (MUCINEX DM) 30-600 MG 12hr tablet Take 1 tablet by mouth 2 (two) times daily as needed for cough.    . Fluticasone-Salmeterol (ADVAIR) 100-50 MCG/DOSE AEPB Inhale 1 puff into the lungs 2 (two) times daily.    [name] losartan (COZAAR) 25 MG tablet TAKE ONE-HALF TABLET BY  MOUTH DAILY 30 tablet 5  . montelukast (SINGULAIR) 10 MG tablet TAKE 1 TABLET (10 MG TOTAL) BY MOUTH AT BEDTIME. 30 tablet 0  . niacin (NIASPAN) 1000 MG CR tablet TAKE 1 TABLET BY MOUTH AT  BEDTIME 90 tablet 3  . omeprazole (PRILOSEC) 20 MG capsule Take 1 capsule (20 mg total) by mouth daily. 30 capsule 2  . sertraline (ZOLOFT) 100 MG tablet TAKE 1 TABLET BY MOUTH  DAILY 90 tablet 3  . simvastatin (ZOCOR) 20 MG tablet TAKE 1 TABLET BY MOUTH  DAILY AT 6 PM. 90 tablet 3  . traZODone (DESYREL) 50 MG tablet Take 0.5-1 tablets (25-50 mg total) by mouth at bedtime as needed for sleep. 90 tablet 3  . triamcinolone cream (KENALOG) 0.1 % Apply 1 application topically 2 (two) times daily as needed. 45 g 1  . Vitamin D, Ergocalciferol, (DRISDOL) 1.25 MG (50000 UT) CAPS capsule Take 1 capsule (50,000 Units total) by mouth every 7 (seven) days. 5 capsule 0   No current facility-administered medications on file prior to visit.    PAST MEDICAL HISTORY: Past Medical History:  Diagnosis Date  . Allergy   . Asthma   . Back pain   . Bulging lumbar disc   . Chicken pox   . Constipation   . Depression   . Fatigue   . Frequent headaches   . Gall bladder disease   . Headache   . Heartburn   . Hyperlipidemia   . IBS  (irritable bowel syndrome)   . Interstitial cystitis   . RA (rheumatoid arthritis) (HCC)   . Shortness of breath on exertion   . Trouble in sleeping   . Urine incontinence   . Vertigo   . Vision changes   . Vitamin D deficiency     PAST SURGICAL HISTORY: Past Surgical History:  Procedure Laterality Date  . CHOLECYSTECTOMY    . cyst removed from back    . NASAL SINUS SURGERY    . TUBAL LIGATION  1999    SOCIAL HISTORY: Social History   Tobacco Use  . Smoking status: Never Smoker  . Smokeless tobacco: Never Used  Substance Use Topics  . Alcohol use: Yes    Alcohol/week: 0.0 standard drinks    Comment: rare--wine  . Drug use: No    FAMILY HISTORY: Family History  Problem Relation Age of Onset  . COPD Mother   . Heart disease Mother   . Polymyalgia rheumatica Mother   . Emphysema Mother   . Stroke Mother   . Depression  Mother   . Anxiety disorder Mother   . Heart disease Father   . Hypertension Father   . Hyperlipidemia Father   . Anxiety disorder Father   . Cancer Paternal Aunt        Breast  . Breast cancer Paternal Aunt 55  . Stroke Maternal Grandmother   . Cancer Maternal Aunt    ROS: Review of Systems  Gastrointestinal: Negative for nausea and vomiting.  Musculoskeletal:       Negative for muscle weakness.  Psychiatric/Behavioral: Positive for depression (emotional eating).   PHYSICAL EXAM: There were no vitals taken for this visit. There is no height or weight on file to calculate BMI. Physical Exam: Pt in no acute distress.  RECENT LABS AND TESTS: BMET    Component Value Date/Time   NA 144 09/09/2018 0932   K 4.0 09/09/2018 0932   CL 104 09/09/2018 0932   CO2 24 09/09/2018 0932   GLUCOSE 89 09/09/2018 0932   GLUCOSE 93 11/13/2015 1501   BUN 14 09/09/2018 0932   CREATININE 1.04 (H) 09/09/2018 0932   CALCIUM 9.3 09/09/2018 0932   GFRNONAA 61 09/09/2018 0932   GFRAA 71 09/09/2018 0932   Lab Results  Component Value Date   HGBA1C 5.1  09/09/2018   HGBA1C 5.0 04/02/2018   HGBA1C 5.1 11/17/2017   HGBA1C 5.0 11/13/2015   Lab Results  Component Value Date   INSULIN 17.3 09/09/2018   INSULIN 10.1 04/02/2018   INSULIN 8.7 11/17/2017   CBC    Component Value Date/Time   WBC 6.0 10/16/2017 1000   WBC 8.6 11/13/2015 1501   RBC 4.39 10/16/2017 1000   RBC 4.49 11/13/2015 1501   HGB 13.4 10/16/2017 1000   HCT 40.2 10/16/2017 1000   PLT 229 10/16/2017 1000   MCV 92 10/16/2017 1000   MCH 30.5 10/16/2017 1000   MCHC 33.3 10/16/2017 1000   MCHC 34.0 11/13/2015 1501   RDW 13.3 10/16/2017 1000   LYMPHSABS 1.4 10/16/2017 1000   EOSABS 0.2 10/16/2017 1000   BASOSABS 0.0 10/16/2017 1000   Iron/TIBC/Ferritin/ %Sat No results found for: IRON, TIBC, FERRITIN, IRONPCTSAT Lipid Panel     Component Value Date/Time   CHOL 153 09/09/2018 0932   TRIG 160 (H) 09/09/2018 0932   HDL 53 09/09/2018 0932   CHOLHDL 2.6 04/02/2018 0844   CHOLHDL 3 11/13/2015 1501   VLDL 29.8 11/13/2015 1501   LDLCALC 68 09/09/2018 0932   Hepatic Function Panel     Component Value Date/Time   PROT 6.3 09/09/2018 0932   ALBUMIN 4.3 09/09/2018 0932   AST 21 09/09/2018 0932   ALT 19 09/09/2018 0932   ALKPHOS 102 09/09/2018 0932   BILITOT 0.3 09/09/2018 0932      Component Value Date/Time   TSH 1.990 11/17/2017 1158   TSH 2.280 10/16/2017 1000   TSH 2.12 11/13/2015 1501    OBESITY BEHAVIORAL INTERVENTION VISIT DOCUMENTATION FOR INSURANCE (~15 minutes)  I, Michaelene Song, am acting as Location manager for [employer], DO  I have reviewed the above documentation for accuracy and completeness, and I agree with the above. Jearld Lesch, DO

## 2019-05-24 ENCOUNTER — Encounter (INDEPENDENT_AMBULATORY_CARE_PROVIDER_SITE_OTHER): Payer: Self-pay

## 2019-05-31 ENCOUNTER — Encounter (INDEPENDENT_AMBULATORY_CARE_PROVIDER_SITE_OTHER): Payer: Self-pay

## 2019-06-01 ENCOUNTER — Telehealth (INDEPENDENT_AMBULATORY_CARE_PROVIDER_SITE_OTHER): Payer: Managed Care, Other (non HMO) | Admitting: Bariatrics

## 2019-06-23 ENCOUNTER — Other Ambulatory Visit (INDEPENDENT_AMBULATORY_CARE_PROVIDER_SITE_OTHER): Payer: Self-pay | Admitting: Bariatrics

## 2019-06-23 DIAGNOSIS — E559 Vitamin D deficiency, unspecified: Secondary | ICD-10-CM

## 2019-06-30 ENCOUNTER — Telehealth (INDEPENDENT_AMBULATORY_CARE_PROVIDER_SITE_OTHER): Payer: Managed Care, Other (non HMO) | Admitting: Bariatrics

## 2019-06-30 DIAGNOSIS — F3289 Other specified depressive episodes: Secondary | ICD-10-CM | POA: Diagnosis not present

## 2019-06-30 DIAGNOSIS — G40A09 Absence epileptic syndrome, not intractable, without status epilepticus: Secondary | ICD-10-CM | POA: Diagnosis not present

## 2019-06-30 DIAGNOSIS — E7849 Other hyperlipidemia: Secondary | ICD-10-CM | POA: Diagnosis not present

## 2019-06-30 DIAGNOSIS — Z6837 Body mass index (BMI) 37.0-37.9, adult: Secondary | ICD-10-CM

## 2019-06-30 NOTE — Progress Notes (Signed)
TeleHealth Visit:  Due to the COVID-19 pandemic, this visit was completed with telemedicine (audio/video) technology to reduce patient and provider exposure as well as to preserve personal protective equipment.   Donna Vasquez has verbally consented to this TeleHealth visit. The patient is located at home, the provider is located at the [facility] office. The participants in this visit include the listed provider and patient. The visit was conducted today via FaceTime.  Chief Complaint: OBESITY Donna Vasquez is here to discuss her progress with her obesity treatment plan along with follow-up of her obesity related diagnoses. Donna Vasquez is on the Category 2 Plan and states she is following her eating plan approximately 75% of the time. Donna Vasquez states she is walking 15-20 minutes 3-4 times per week.  Today's visit was #: 32 Starting weight: 248 lbs Starting date: 11/17/2017  Interim History: Donna Vasquez states that her weight remains the same. She has been under a lot of stress. She reports doing better with her water intake.  Subjective:   Other depression, with emotional eating. Donna Vasquez is struggling with emotional eating and using food for comfort to the extent that it is negatively impacting her health. She has been working on behavior modification techniques to help reduce her emotional eating and has been somewhat successful. She shows no sign of suicidal or homicidal ideations. Donna Vasquez was prescribed Trokendi at her last visit but declined.  Other hyperlipidemia. Donna Vasquez is taking Zocor and Niaspan. No myalgias.  Lab Results  Component Value Date   CHOL 153 09/09/2018   HDL 53 09/09/2018   LDLCALC 68 09/09/2018   TRIG 160 (H) 09/09/2018   CHOLHDL 2.6 04/02/2018   Lab Results  Component Value Date   ALT 19 09/09/2018   AST 21 09/09/2018   ALKPHOS 102 09/09/2018   BILITOT 0.3 09/09/2018   The 10-year ASCVD risk score Mikey Bussing DC Jr., et al., 2013) is: 3.1%   Values  used to calculate the score:     Age: 54 years     Sex: Female     Is Non-Hispanic African American: No     Diabetic: Yes     Tobacco smoker: No     Systolic Blood Pressure: 147 mmHg     Is BP treated: Yes     HDL Cholesterol: 53 mg/dL     Total Cholesterol: 153 mg/dL  Absence seizure ([place]). Donna Vasquez reports she has episodes where she "zones out.". No evidence of seizure activity. She states she was evaluated for ADD/ADHD, which was negative.  Assessment/Plan:   Other depression, with emotional eating. Behavior modification techniques were discussed today to help Donna Vasquez deal with her emotional/non-hunger eating behaviors.  Orders and follow up as documented in patient record. Will call [facility].  Ambulatory referral to [facility] [place] if possible (female). She will be referred to Dr. Mallie Mussel, our bariatric psychologist.  Other hyperlipidemia. Cardiovascular risk and specific lipid/LDL goals reviewed.  We discussed several lifestyle modifications today and Donna Vasquez will continue to work on diet, exercise and weight loss efforts. Orders and follow up as documented in patient record. She will continue her medications as prescribed.  Counseling Intensive lifestyle modifications are the first line treatment for this issue. . Dietary changes: Increase soluble fiber. Decrease simple carbohydrates. . Exercise changes: Moderate to vigorous-intensity aerobic activity 150 minutes per week if tolerated. . Lipid-lowering medications: see documented in medical record.  Absence seizure ([place]). Ambulatory referral to Neurology for evaluation.  Class 2 severe obesity with serious comorbidity  and body mass index (BMI) of 37.0 to 37.9 in adult, unspecified obesity type (HCC).  Donna Vasquez is currently in the action stage of change. As such, her goal is to continue with weight loss efforts. She has agreed to practicing portion control and making smarter food choices, such as increasing vegetables and  decreasing simple carbohydrates.   She will work on meal planning and intentional eating.  Exercise goals: Donna Vasquez will continue walking, 1 mile every other day.  Behavioral modification strategies: increasing lean protein intake, decreasing simple carbohydrates, increasing vegetables, increasing water intake, decreasing eating out, no skipping meals, meal planning and cooking strategies and keeping healthy foods in the home.  Donna Vasquez has agreed to follow-up with our clinic in 2 weeks. She was informed of the importance of frequent follow-up visits to maximize her success with intensive lifestyle modifications for her multiple health conditions.  Objective:   VITALS: Per patient if applicable, see vitals. GENERAL: Alert and in no acute distress. CARDIOPULMONARY: No increased WOB. Speaking in clear sentences.  PSYCH: Pleasant and cooperative. Speech normal rate and rhythm. Affect is appropriate. Insight and judgement are appropriate. Attention is focused, linear, and appropriate.  NEURO: Oriented as arrived to appointment on time with no prompting.   Lab Results  Component Value Date   CREATININE 1.04 (H) 09/09/2018   BUN 14 09/09/2018   NA 144 09/09/2018   K 4.0 09/09/2018   CL 104 09/09/2018   CO2 24 09/09/2018   Lab Results  Component Value Date   ALT 19 09/09/2018   AST 21 09/09/2018   ALKPHOS 102 09/09/2018   BILITOT 0.3 09/09/2018   Lab Results  Component Value Date   HGBA1C 5.1 09/09/2018   HGBA1C 5.0 04/02/2018   HGBA1C 5.1 11/17/2017   HGBA1C 5.0 11/13/2015   Lab Results  Component Value Date   INSULIN 17.3 09/09/2018   INSULIN 10.1 04/02/2018   INSULIN 8.7 11/17/2017   Lab Results  Component Value Date   TSH 1.990 11/17/2017   Lab Results  Component Value Date   CHOL 153 09/09/2018   HDL 53 09/09/2018   LDLCALC 68 09/09/2018   TRIG 160 (H) 09/09/2018   CHOLHDL 2.6 04/02/2018   Lab Results  Component Value Date   WBC 6.0 10/16/2017   HGB 13.4  10/16/2017   HCT 40.2 10/16/2017   MCV 92 10/16/2017   PLT 229 10/16/2017   No results found for: IRON, TIBC, FERRITIN  Attestation Statements:   Reviewed by clinician on day of visit: allergies, medications, problem list, medical history, surgical history, family history, social history, and previous encounter notes.  Time spent on pre-visit chart review and post-visit care was 22 minutes.   Fernanda Drum, am acting as Energy manager for [employer], DO   I have reviewed the above documentation for accuracy and completeness, and I agree with the above. Corinna Capra, DO

## 2019-07-13 ENCOUNTER — Ambulatory Visit (INDEPENDENT_AMBULATORY_CARE_PROVIDER_SITE_OTHER): Payer: Managed Care, Other (non HMO) | Admitting: Bariatrics

## 2019-07-13 ENCOUNTER — Ambulatory Visit: Payer: Managed Care, Other (non HMO) | Admitting: Neurology

## 2019-07-13 ENCOUNTER — Encounter (INDEPENDENT_AMBULATORY_CARE_PROVIDER_SITE_OTHER): Payer: Self-pay | Admitting: Bariatrics

## 2019-07-13 ENCOUNTER — Other Ambulatory Visit: Payer: Self-pay

## 2019-07-13 ENCOUNTER — Encounter: Payer: Self-pay | Admitting: Neurology

## 2019-07-13 VITALS — BP 137/82 | HR 84 | Temp 98.2°F | Ht 68.0 in | Wt 259.0 lb

## 2019-07-13 VITALS — BP 154/90 | HR 88 | Temp 97.3°F | Wt 263.0 lb

## 2019-07-13 DIAGNOSIS — E559 Vitamin D deficiency, unspecified: Secondary | ICD-10-CM | POA: Diagnosis not present

## 2019-07-13 DIAGNOSIS — R413 Other amnesia: Secondary | ICD-10-CM

## 2019-07-13 DIAGNOSIS — F3289 Other specified depressive episodes: Secondary | ICD-10-CM | POA: Diagnosis not present

## 2019-07-13 DIAGNOSIS — Z6839 Body mass index (BMI) 39.0-39.9, adult: Secondary | ICD-10-CM | POA: Diagnosis not present

## 2019-07-13 DIAGNOSIS — R404 Transient alteration of awareness: Secondary | ICD-10-CM | POA: Diagnosis not present

## 2019-07-13 NOTE — Progress Notes (Signed)
PATIENT: Donna Vasquez DOB: 1965/09/16  Chief Complaint  Patient presents with  . Possible Absence Seziures    Reports having multiple daily staring spells for the last year. She is unsure how long the events last. States ADD was ruled out in the past.   . Luberta Robertson, Kaleen Mask, DO - referring provider  . PCP    Lorre Munroe, NP     HISTORICAL  [name] is a 54 year old female, seen in request by bariatrics physician Dr. [name], Kaleen Mask, and her primary care nurse practitioner Lorre Munroe for evaluation of frequent staring spells, initial evaluation was on July 13, 2019.  I have reviewed and summarized the referring note from the referring physician.  She has past medical history of hyperlipidemia, hypertension, depression anxiety taking Wellbutrin XL 300 mg daily, trazodone 50 mg daily, Zoloft 100 mg daily, she had been on this combination for more than 1 year, remains depressed, especially since COVID-19, she has to work from home, reported 40 pound weight gain over past 1 year  She has strong family history of dementia, her mother, 2 maternal uncles suffered Alzheimer's disease, her father also was diagnosed with vascular dementia in his late 24s  She used to work as a Architectural technologist, around 2010, she began to notice intermittent word finding difficulties, short-term memory loss, while she was disciplining her student, she forgot why she was disciplining them in the middle of the conversation  She started a new job at [employer] 2018, went through weakness computer program training, she describes she felt overwhelmed at the beginning, but now she has been doing the same job for 3 years, she continue has low productivity, 50 to 80% as expected, she often got lost with quick switch of the program, computer screen, oftentimes she fell difficulty focusing, few minutes elapsed without her realizing it.  She denied clinical seizure activity,  She also  suffered obesity, sedentary lifestyle, was started on CPAP in 2020, which has helped her job performance Laboratory evaluation in 2020: Vitamin D 31.5, CMP showed mildly elevated creatinine 1.04, A1c 5.1, elevated triglyceride 160, normal TSH  REVIEW OF SYSTEMS: Full 14 system review of systems performed and notable only for as above All other review of systems were negative.  ALLERGIES: Allergies  Allergen Reactions  . Flonase [Fluticasone Propionate] Other (See Comments)    Nasal irritation, burning, but can tolerate advair inhaler.    Gaetana Michaelis Antibiotics Hives    HOME MEDICATIONS: Current Outpatient Medications  Medication Sig Dispense Refill  . albuterol (PROAIR HFA) 108 (90 Base) MCG/ACT inhaler Inhale 1-2 puffs into the lungs every 6 (six) hours as needed for wheezing or shortness of breath. 1 Inhaler 0  . aspirin 81 MG tablet Take 81 mg by mouth daily.    [name] aspirin-acetaminophen-caffeine (EXCEDRIN MIGRAINE) 250-250-65 MG tablet Take by mouth every 6 (six) hours as needed for headache.    [name] buPROPion (WELLBUTRIN XL) 300 MG 24 hr tablet TAKE 1 TABLET BY MOUTH  DAILY 30 tablet 11  . dextromethorphan-guaiFENesin (MUCINEX DM) 30-600 MG 12hr tablet Take 1 tablet by mouth 2 (two) times daily as needed for cough.    . Fluticasone-Salmeterol (ADVAIR) 100-50 MCG/DOSE AEPB Inhale 1 puff into the lungs 2 (two) times daily.    [name] losartan (COZAAR) 25 MG tablet TAKE ONE-HALF TABLET BY  MOUTH DAILY 30 tablet 5  . montelukast (SINGULAIR) 10 MG tablet TAKE 1 TABLET (10 MG TOTAL) BY  MOUTH AT BEDTIME. 30 tablet 0  . niacin (NIASPAN) 1000 MG CR tablet TAKE 1 TABLET BY MOUTH AT  BEDTIME 90 tablet 3  . omeprazole (PRILOSEC) 20 MG capsule Take 1 capsule (20 mg total) by mouth daily. 30 capsule 2  . sertraline (ZOLOFT) 100 MG tablet TAKE 1 TABLET BY MOUTH  DAILY 90 tablet 3  . simvastatin (ZOCOR) 20 MG tablet TAKE 1 TABLET BY MOUTH  DAILY AT 6 PM. 90 tablet 3  . traZODone (DESYREL) 50 MG tablet Take 0.5-1  tablets (25-50 mg total) by mouth at bedtime as needed for sleep. 90 tablet 3  . triamcinolone cream (KENALOG) 0.1 % Apply 1 application topically 2 (two) times daily as needed. 45 g 1  . Vitamin D, Ergocalciferol, (DRISDOL) 1.25 MG (50000 UT) CAPS capsule Take 1 capsule (50,000 Units total) by mouth every 7 (seven) days. 5 capsule 0   No current facility-administered medications for this visit.    PAST MEDICAL HISTORY: Past Medical History:  Diagnosis Date  . Allergy   . Asthma   . Back pain   . Bulging lumbar disc   . Chicken pox   . Constipation   . Depression   . Fatigue   . Frequent headaches   . Gall bladder disease   . Headache   . Heartburn   . Hyperlipidemia   . IBS (irritable bowel syndrome)   . Interstitial cystitis   . RA (rheumatoid arthritis) (HCC)   . Shortness of breath on exertion   . Staring episodes   . Trouble in sleeping   . Urine incontinence   . Vertigo   . Vision changes   . Vitamin D deficiency     PAST SURGICAL HISTORY: Past Surgical History:  Procedure Laterality Date  . CHOLECYSTECTOMY    . cyst removed from back    . NASAL SINUS SURGERY    . TUBAL LIGATION  1999    FAMILY HISTORY: Family History  Problem Relation Age of Onset  . COPD Mother   . Heart disease Mother   . Polymyalgia rheumatica Mother   . Emphysema Mother   . Stroke Mother   . Depression Mother   . Anxiety disorder Mother   . Heart disease Father   . Hypertension Father   . Hyperlipidemia Father   . Anxiety disorder Father   . Cancer Paternal Aunt        Breast  . Breast cancer Paternal Aunt 56  . Stroke Maternal Grandmother   . Cancer Maternal Aunt   . Other Brother        melas syndrom  . Parkinson's disease Maternal Grandfather     SOCIAL HISTORY: Social History   Socioeconomic History  . Marital status: Married    Spouse name: Jesusita Oka  . Number of children: 2  . Years of education: college  . Highest education level: Bachelor's degree (e.g., BA, AB,  BS)  Occupational History  . Occupation: Editor, commissioning  Tobacco Use  . Smoking status: Never Smoker  . Smokeless tobacco: Never Used  Substance and Sexual Activity  . Alcohol use: Yes    Alcohol/week: 0.0 standard drinks    Comment: rare--wine  . Drug use: No  . Sexual activity: Not on file  Other Topics Concern  . Not on file  Social History Narrative   Lives at home with family.   Right-handed.   Caffeine use: Diet Pepsi - 8 ounces to 2 liters per day - varies.  Social Determinants of Health   Financial Resource Strain:   . Difficulty of Paying Living Expenses: Not on file  Food Insecurity:   . Worried About Charity fundraiser in the Last Year: Not on file  . Ran Out of Food in the Last Year: Not on file  Transportation Needs:   . Lack of Transportation (Medical): Not on file  . Lack of Transportation (Non-Medical): Not on file  Physical Activity:   . Days of Exercise per Week: Not on file  . Minutes of Exercise per Session: Not on file  Stress:   . Feeling of Stress : Not on file  Social Connections:   . Frequency of Communication with Friends and Family: Not on file  . Frequency of Social Gatherings with Friends and Family: Not on file  . Attends Religious Services: Not on file  . Active Member of Clubs or Organizations: Not on file  . Attends Archivist Meetings: Not on file  . Marital Status: Not on file  Intimate Partner Violence:   . Fear of Current or Ex-Partner: Not on file  . Emotionally Abused: Not on file  . Physically Abused: Not on file  . Sexually Abused: Not on file     PHYSICAL EXAM   Vitals:   07/13/19 0957  BP: (!) 154/90  Pulse: 88  Temp: (!) 97.3 F (36.3 C)  Weight: 263 lb (119.3 kg)    Not recorded      Body mass index is 39.99 kg/m.  PHYSICAL EXAMNIATION:  Gen: NAD, conversant, well nourised, well groomed                     Cardiovascular: Regular rate rhythm, no peripheral edema, warm,  nontender. Eyes: Conjunctivae clear without exudates or hemorrhage Neck: Supple, no carotid bruits. Pulmonary: Clear to auscultation bilaterally   NEUROLOGICAL EXAM:  MMSE - Mini Mental State Exam 07/13/2019  Orientation to time 5  Orientation to Place 5  Registration 3  Attention/ Calculation 5  Recall 3  Language- name 2 objects 2  Language- repeat 1  Language- follow 3 step command 3  Language- read & follow direction 1  Write a sentence 1  Copy design 1  Total score 30     CRANIAL NERVES: CN II: Visual fields are full to confrontation. Pupils are round equal and briskly reactive to light. CN III, IV, VI: extraocular movement are normal. No ptosis. CN V: Facial sensation is intact to light touch CN VII: Face is symmetric with normal eye closure  CN VIII: Hearing is normal to causal conversation. CN IX, X: Phonation is normal. CN XI: Head turning and shoulder shrug are intact  MOTOR: There is no pronator drift of out-stretched arms. Muscle bulk and tone are normal. Muscle strength is normal.  REFLEXES: Reflexes are 2+ and symmetric at the biceps, triceps, knees, and ankles. Plantar responses are flexor.  SENSORY: Intact to light touch, pinprick and vibratory sensation are intact in fingers and toes.  COORDINATION: There is no trunk or limb dysmetria noted.  GAIT/STANCE: Posture is normal. Gait is steady with normal steps, base, arm swing, and turning. Heel and toe walking are normal. Tandem gait is normal.  Romberg is absent.   DIAGNOSTIC DATA (LABS, IMAGING, TESTING) - I reviewed patient records, labs, notes, testing and imaging myself where available.   ASSESSMENT AND PLAN  SHALISA MCQUADE is a 54 y.o. female   Memory loss  Most likely due to her  mood disorder, lack of concentration  In the setting depression, sedentary lifestyle, obesity,  Does have strong family history of dementia,  MRI of the brain to rule out structural lesion  With her reported  staring spells, will proceed with EEG,  Laboratory evaluations, TSH, B12, RPR  Emphasized importance of moderate exercise  Return to clinic with nurse practitioner Sarah in 6 months   Levert Feinstein, M.D. Ph.D.  [facility] [address], [address] [place], [place] [address] Ph: [phone] Fax: [phone]  CC: Christiane Ha, NP

## 2019-07-14 ENCOUNTER — Encounter (INDEPENDENT_AMBULATORY_CARE_PROVIDER_SITE_OTHER): Payer: Self-pay | Admitting: Bariatrics

## 2019-07-14 ENCOUNTER — Telehealth: Payer: Self-pay | Admitting: Neurology

## 2019-07-14 LAB — VITAMIN B12: Vitamin B-12: 375 pg/mL (ref 232–1245)

## 2019-07-14 LAB — TSH: TSH: 1.7 u[IU]/mL (ref 0.450–4.500)

## 2019-07-14 LAB — RPR: RPR Ser Ql: NONREACTIVE

## 2019-07-14 NOTE — Telephone Encounter (Signed)
[employer] order sent to [facility]. They will obtain the auth and reach out to the patient to schedule.  

## 2019-07-14 NOTE — Progress Notes (Signed)
Chief Complaint:   OBESITY Donna Vasquez is here to discuss her progress with her obesity treatment plan along with follow-up of her obesity related diagnoses. Donna Vasquez is on the Category 2 Plan and states she is following her eating plan approximately 50% of the time. Donna Vasquez states she is walking 20 minutes 3 times per week.  Today's visit was #: 34 Starting weight: 248 lbs Starting date: 11/17/2017 Today's weight: 259 lbs Today's date: 07/13/2019 Total lbs lost to date: 0 Total lbs lost since last in-office visit: 0  Interim History: Donna Vasquez was last seen in the office 12/14/2018 and has had TeleHealth visits periodically in the meantime. She reports doing better with her water intake.  Subjective:   Other depression, with emotional eating. Donna Vasquez is struggling with emotional eating and using food for comfort to the extent that it is negatively impacting her health. She has been working on behavior modification techniques to help reduce her emotional eating and has been somewhat successful. She shows no sign of suicidal or homicidal ideations. Kally was referred to Neurology at her last visit. She is taking Wellbutrin and Zoloft from another provider.  Vitamin D deficiency. Donna Vasquez is taking high dose Vitamin D. Last Vitamin D 31.5 on 09/09/2018.  Assessment/Plan:   Other depression, with emotional eating. Behavior modification techniques were discussed today to help Donna Vasquez deal with her emotional/non-hunger eating behaviors.  Orders and follow up as documented in patient record. Donna Vasquez will continue her medications as directed. She will follow-up with Neurology if needed and will call [facility].  Vitamin D deficiency. Low Vitamin D level contributes to fatigue and are associated with obesity, breast, and colon cancer. She agrees to continue to take Vitamin D and will follow-up for routine testing of Vitamin D, at least 2-3 times per year to avoid over-replacement.  Class  2 severe obesity with serious comorbidity and body mass index (BMI) of 39.0 to 39.9 in adult, unspecified obesity type (HCC).  Donna Vasquez is currently in the action stage of change. As such, her goal is to continue with weight loss efforts. She has agreed to the Category 2 Plan alternating with the Pescatarian plan with additional lunch and dinner options.   She will work on meal planning, intentional eating, and will get a standing/sitting desk. She has gotten a new PCP.  Exercise goals: Donna Vasquez will continue to walk 20 minutes 3-4 times per week.  Behavioral modification strategies: increasing lean protein intake, decreasing simple carbohydrates, increasing vegetables, increasing water intake, decreasing eating out, no skipping meals, meal planning and cooking strategies, keeping healthy foods in the home and planning for success.  Donna Vasquez has agreed to follow-up with our clinic in 2 weeks. She was informed of the importance of frequent follow-up visits to maximize her success with intensive lifestyle modifications for her multiple health conditions.   Objective:   Blood pressure 137/82, pulse 84, temperature 98.2 F (36.8 C), temperature source Oral, height 5\' 8"  (1.727 m), weight 259 lb (117.5 kg), SpO2 97 %. Body mass index is 39.38 kg/m.  General: Cooperative, alert, well developed, in no acute distress. HEENT: Conjunctivae and lids unremarkable. Cardiovascular: Regular rhythm.  Lungs: Normal work of breathing. Neurologic: No focal deficits.   Lab Results  Component Value Date   CREATININE 1.04 (H) 09/09/2018   BUN 14 09/09/2018   NA 144 09/09/2018   K 4.0 09/09/2018   CL 104 09/09/2018   CO2 24 09/09/2018   Lab Results  Component Value Date  ALT 19 09/09/2018   AST 21 09/09/2018   ALKPHOS 102 09/09/2018   BILITOT 0.3 09/09/2018   Lab Results  Component Value Date   HGBA1C 5.1 09/09/2018   HGBA1C 5.0 04/02/2018   HGBA1C 5.1 11/17/2017   HGBA1C 5.0 11/13/2015   Lab  Results  Component Value Date   INSULIN 17.3 09/09/2018   INSULIN 10.1 04/02/2018   INSULIN 8.7 11/17/2017   Lab Results  Component Value Date   TSH 1.700 07/13/2019   Lab Results  Component Value Date   CHOL 153 09/09/2018   HDL 53 09/09/2018   LDLCALC 68 09/09/2018   TRIG 160 (H) 09/09/2018   CHOLHDL 2.6 04/02/2018   Lab Results  Component Value Date   WBC 6.0 10/16/2017   HGB 13.4 10/16/2017   HCT 40.2 10/16/2017   MCV 92 10/16/2017   PLT 229 10/16/2017   No results found for: IRON, TIBC, FERRITIN  Attestation Statements:   Reviewed by clinician on day of visit: allergies, medications, problem list, medical history, surgical history, family history, social history, and previous encounter notes.  Time spent on visit including pre-visit chart review and post-visit care was 20 minutes.   Migdalia Dk, am acting as Location manager for [employer], DO   I have reviewed the above documentation for accuracy and completeness, and I agree with the above. Jearld Lesch, DO

## 2019-07-22 ENCOUNTER — Other Ambulatory Visit: Payer: Self-pay

## 2019-07-22 ENCOUNTER — Encounter: Payer: Self-pay | Admitting: Internal Medicine

## 2019-07-22 ENCOUNTER — Ambulatory Visit: Payer: Managed Care, Other (non HMO) | Admitting: Internal Medicine

## 2019-07-22 VITALS — BP 138/84 | HR 70 | Temp 98.2°F | Wt 264.0 lb

## 2019-07-22 DIAGNOSIS — G4733 Obstructive sleep apnea (adult) (pediatric): Secondary | ICD-10-CM | POA: Diagnosis not present

## 2019-07-22 DIAGNOSIS — G40A09 Absence epileptic syndrome, not intractable, without status epilepticus: Secondary | ICD-10-CM | POA: Diagnosis not present

## 2019-07-22 DIAGNOSIS — J3089 Other allergic rhinitis: Secondary | ICD-10-CM | POA: Diagnosis not present

## 2019-07-22 DIAGNOSIS — E782 Mixed hyperlipidemia: Secondary | ICD-10-CM

## 2019-07-22 DIAGNOSIS — I10 Essential (primary) hypertension: Secondary | ICD-10-CM

## 2019-07-22 DIAGNOSIS — G44229 Chronic tension-type headache, not intractable: Secondary | ICD-10-CM

## 2019-07-22 DIAGNOSIS — J4540 Moderate persistent asthma, uncomplicated: Secondary | ICD-10-CM | POA: Diagnosis not present

## 2019-07-22 DIAGNOSIS — F329 Major depressive disorder, single episode, unspecified: Secondary | ICD-10-CM

## 2019-07-22 DIAGNOSIS — M069 Rheumatoid arthritis, unspecified: Secondary | ICD-10-CM

## 2019-07-22 DIAGNOSIS — K582 Mixed irritable bowel syndrome: Secondary | ICD-10-CM

## 2019-07-22 DIAGNOSIS — F32A Depression, unspecified: Secondary | ICD-10-CM

## 2019-07-22 DIAGNOSIS — F5104 Psychophysiologic insomnia: Secondary | ICD-10-CM

## 2019-07-22 DIAGNOSIS — K219 Gastro-esophageal reflux disease without esophagitis: Secondary | ICD-10-CM

## 2019-07-22 DIAGNOSIS — F419 Anxiety disorder, unspecified: Secondary | ICD-10-CM

## 2019-07-22 MED ORDER — ALBUTEROL SULFATE HFA 108 (90 BASE) MCG/ACT IN AERS: 1.0000 | INHALATION_SPRAY | Freq: Four times a day (QID) | RESPIRATORY_TRACT | 2 refills | Status: DC | PRN

## 2019-07-22 MED ORDER — MONTELUKAST SODIUM 10 MG PO TABS: ORAL_TABLET | ORAL | 5 refills | Status: DC

## 2019-07-22 MED ORDER — FLUTICASONE-SALMETEROL 100-50 MCG/DOSE IN AEPB: 1.0000 | INHALATION_SPRAY | Freq: Two times a day (BID) | RESPIRATORY_TRACT | 5 refills | Status: DC

## 2019-07-22 MED ORDER — LOSARTAN POTASSIUM 25 MG PO TABS: 25.0000 mg | ORAL_TABLET | Freq: Every day | ORAL | 5 refills | Status: DC

## 2019-07-22 NOTE — Progress Notes (Signed)
HPI  Patient presents to the clinic today to establish care and for management of the conditions listed below.  She is transferring care from Dr. Deborra Vasquez.  Allergy Induced Asthma: Moderate, persistent.  Managed on Advair, Singulair and Albuterol as needed (not used in the last year).  There are no PFTs on file.  OSA: She averages  7-10 hours of sleep per night with the use of her CPAP.  Sleep study from 03/2018 reviewed.  Anxiety and Depression: Chronic dysthymia.  Managed on Sertraline and Wellbutrin.  She is not currently seeing a therapist but plans on getting some counseling through work.  She denies SI/HI.  Frequent Headaches: She takes Excedrin Migraine as needed with good relief of symptoms.  IBS: Alternating constipation or diarrhea. She reports associated gas and bloating. She controls this with diet.  RA: Juvenile, mainly in her knees and back. She does not take anything OTC for this. She is not following with rheumatology.  GERD: Triggered by stress and certain foods.  She no longer takes Omeprazole.  There is no upper GI on file.  Insomnia: She has trouble staying asleep.  She takes Trazodone nightly with good relief of symptoms.  HLD: Her last LDL was 68, 08/2018.  She denies myalgias on Simvastatin.  She takes Niacin as well.  She tries to consume a low-fat.  HTN: Her BP today is 138/84.  She is taking Losartan as prescribed. ECG from 11/2017 reviewed.  Absence Seizures: She is currently seeing a neurologist for workup of this, but she is not currently on seizure medicines at this time.  Flu: 02/2019 Tetanus: 07/2010 Pneumovax: 12/2012 Pap Smear: 09/2017  Mammogram: 09/2017 Colon Screening: 2017, Dr. Tiffany Vasquez Vision Screening: annually Dentist: biannually  Past Medical History:  Diagnosis Date  . Allergy   . Asthma   . Back pain   . Bulging lumbar disc   . Chicken pox   . Constipation   . Depression   . Fatigue   . Frequent headaches   . Gall bladder disease   .  Headache   . Heartburn   . Hyperlipidemia   . IBS (irritable bowel syndrome)   . Interstitial cystitis   . RA (rheumatoid arthritis) ([place])   . Shortness of breath on exertion   . Staring episodes   . Trouble in sleeping   . Urine incontinence   . Vertigo   . Vision changes   . Vitamin D deficiency     Current Outpatient Medications  Medication Sig Dispense Refill  . albuterol (PROAIR HFA) 108 (90 Base) MCG/ACT inhaler Inhale 1-2 puffs into the lungs every 6 (six) hours as needed for wheezing or shortness of breath. 1 Inhaler 0  . aspirin 81 MG tablet Take 81 mg by mouth daily.    [name] aspirin-acetaminophen-caffeine (EXCEDRIN MIGRAINE) 250-250-65 MG tablet Take by mouth every 6 (six) hours as needed for headache.    [name] buPROPion (WELLBUTRIN XL) 300 MG 24 hr tablet TAKE 1 TABLET BY MOUTH  DAILY 30 tablet 11  . dextromethorphan-guaiFENesin (MUCINEX DM) 30-600 MG 12hr tablet Take 1 tablet by mouth 2 (two) times daily as needed for cough.    . Fluticasone-Salmeterol (ADVAIR) 100-50 MCG/DOSE AEPB Inhale 1 puff into the lungs 2 (two) times daily.    [name] losartan (COZAAR) 25 MG tablet TAKE ONE-HALF TABLET BY  MOUTH DAILY 30 tablet 5  . montelukast (SINGULAIR) 10 MG tablet TAKE 1 TABLET (10 MG TOTAL) BY MOUTH AT BEDTIME. 30 tablet 0  . niacin (  NIASPAN) 1000 MG CR tablet TAKE 1 TABLET BY MOUTH AT  BEDTIME 90 tablet 3  . omeprazole (PRILOSEC) 20 MG capsule Take 1 capsule (20 mg total) by mouth daily. 30 capsule 2  . sertraline (ZOLOFT) 100 MG tablet TAKE 1 TABLET BY MOUTH  DAILY 90 tablet 3  . simvastatin (ZOCOR) 20 MG tablet TAKE 1 TABLET BY MOUTH  DAILY AT 6 PM. 90 tablet 3  . traZODone (DESYREL) 50 MG tablet Take 0.5-1 tablets (25-50 mg total) by mouth at bedtime as needed for sleep. 90 tablet 3  . triamcinolone cream (KENALOG) 0.1 % Apply 1 application topically 2 (two) times daily as needed. 45 g 1  . Vitamin D, Ergocalciferol, (DRISDOL) 1.25 MG (50000 UT) CAPS capsule Take 1 capsule (50,000 Units  total) by mouth every 7 (seven) days. 5 capsule 0   No current facility-administered medications for this visit.    Allergies  Allergen Reactions  . Flonase [Fluticasone Propionate] Other (See Comments)    Nasal irritation, burning, but can tolerate advair inhaler.    . Sulfa Antibiotics Hives    Family History  Problem Relation Age of Onset  . COPD Mother   . Heart disease Mother   . Polymyalgia rheumatica Mother   . Emphysema Mother   . Stroke Mother   . Depression Mother   . Anxiety disorder Mother   . Heart disease Father   . Hypertension Father   . Hyperlipidemia Father   . Anxiety disorder Father   . Cancer Paternal Aunt        Breast  . Breast cancer Paternal Aunt 50  . Stroke Maternal Grandmother   . Cancer Maternal Aunt   . Other Brother        melas syndrom  . Parkinson's disease Maternal Grandfather     Social History   Socioeconomic History  . Marital status: Married    Spouse name: Donna Vasquez  . Number of children: 2  . Years of education: college  . Highest education level: Bachelor's degree (e.g., BA, AB, BS)  Occupational History  . Occupation: Editor, commissioning  Tobacco Use  . Smoking status: Never Smoker  . Smokeless tobacco: Never Used  Substance and Sexual Activity  . Alcohol use: Yes    Alcohol/week: 0.0 standard drinks    Comment: rare--wine  . Drug use: No  . Sexual activity: Not on file  Other Topics Concern  . Not on file  Social History Narrative   Lives at home with family.   Right-handed.   Caffeine use: Diet Pepsi - 8 ounces to 2 liters per day - varies.   Social Determinants of Health   Financial Resource Strain:   . Difficulty of Paying Living Expenses: Not on file  Food Insecurity:   . Worried About Programme researcher, broadcasting/film/video in the Last Year: Not on file  . Ran Out of Food in the Last Year: Not on file  Transportation Needs:   . Lack of Transportation (Medical): Not on file  . Lack of Transportation (Non-Medical):  Not on file  Physical Activity:   . Days of Exercise per Week: Not on file  . Minutes of Exercise per Session: Not on file  Stress:   . Feeling of Stress : Not on file  Social Connections:   . Frequency of Communication with Friends and Family: Not on file  . Frequency of Social Gatherings with Friends and Family: Not on file  . Attends Religious Services: Not on file  .  Active Member of Clubs or Organizations: Not on file  . Attends Banker Meetings: Not on file  . Marital Status: Not on file  Intimate Partner Violence:   . Fear of Current or Ex-Partner: Not on file  . Emotionally Abused: Not on file  . Physically Abused: Not on file  . Sexually Abused: Not on file    ROS:  Constitutional: Pt reports intermittent headaches. Denies fever, malaise, fatigue, or abrupt weight changes.  HEENT: Denies eye pain, eye redness, ear pain, ringing in the ears, wax buildup, runny nose, nasal congestion, bloody nose, or sore throat. Respiratory: Denies difficulty breathing, shortness of breath, cough or sputum production.   Cardiovascular: Denies chest pain, chest tightness, palpitations or swelling in the hands or feet.  Gastrointestinal: Pt reports intermittent constipation, diarrhea, and bloating. Denies abdominal pain, or blood in the stool.  GU: Denies frequency, urgency, pain with urination, blood in urine, odor or discharge. Musculoskeletal: Pt reports intermittent knee and back pain. Denies decrease in range of motion, difficulty with gait, muscle pain or joint swelling.  Skin: Denies redness, rashes, lesions or ulcercations.  Neurological: Pt reports difficulty memory/staring spells. Denies dizziness, difficulty with speech or problems with balance and coordination.  Psych: Pt has a history of anxiety and depression. Denies SI/HI.  No other specific complaints in a complete review of systems (except as listed in HPI above).  PE:  BP 138/84   Pulse 70   Temp 98.2 F  (36.8 C) (Temporal)   Wt 264 lb (119.7 kg)   LMP 03/18/2019 (Approximate)   SpO2 98%   BMI 40.14 kg/m   Wt Readings from Last 3 Encounters:  07/13/19 259 lb (117.5 kg)  07/13/19 263 lb (119.3 kg)  12/14/18 247 lb (112 kg)    General: Appears her stated age, obese, in NAD. HEENT: Head: normal shape and size;  Cardiovascular: Normal rate and rhythm. S1,S2 noted.  No murmur, rubs or gallops noted.  Pulmonary/Chest: Normal effort and positive vesicular breath sounds. No respiratory distress. No wheezes, rales or ronchi noted.  Musculoskeletal: No difficulty with gait.  Neurological: Alert and oriented.  Psychiatric: Mood and affect normal. Behavior is normal. Judgment and thought content normal.     BMET    Component Value Date/Time   NA 144 09/09/2018 0932   K 4.0 09/09/2018 0932   CL 104 09/09/2018 0932   CO2 24 09/09/2018 0932   GLUCOSE 89 09/09/2018 0932   GLUCOSE 93 11/13/2015 1501   BUN 14 09/09/2018 0932   CREATININE 1.04 (H) 09/09/2018 0932   CALCIUM 9.3 09/09/2018 0932   GFRNONAA 61 09/09/2018 0932   GFRAA 71 09/09/2018 0932    Lipid Panel     Component Value Date/Time   CHOL 153 09/09/2018 0932   TRIG 160 (H) 09/09/2018 0932   HDL 53 09/09/2018 0932   CHOLHDL 2.6 04/02/2018 0844   CHOLHDL 3 11/13/2015 1501   VLDL 29.8 11/13/2015 1501   LDLCALC 68 09/09/2018 0932    CBC    Component Value Date/Time   WBC 6.0 10/16/2017 1000   WBC 8.6 11/13/2015 1501   RBC 4.39 10/16/2017 1000   RBC 4.49 11/13/2015 1501   HGB 13.4 10/16/2017 1000   HCT 40.2 10/16/2017 1000   PLT 229 10/16/2017 1000   MCV 92 10/16/2017 1000   MCH 30.5 10/16/2017 1000   MCHC 33.3 10/16/2017 1000   MCHC 34.0 11/13/2015 1501   RDW 13.3 10/16/2017 1000   LYMPHSABS 1.4 10/16/2017 1000  EOSABS 0.2 10/16/2017 1000   BASOSABS 0.0 10/16/2017 1000    Hgb A1C Lab Results  Component Value Date   HGBA1C 5.1 09/09/2018     Assessment and Plan:   Nicki Reaper, NP This visit  occurred during the SARS-CoV-2 public health emergency.  Safety protocols were in place, including screening questions prior to the visit, additional usage of staff PPE, and extensive cleaning of exam room while observing appropriate contact time as indicated for disinfecting solutions.

## 2019-07-22 NOTE — Assessment & Plan Note (Signed)
No treatment °Will monitor °

## 2019-07-22 NOTE — Assessment & Plan Note (Signed)
Continue Sertraline and Wellbutrin She will seek counseling through work

## 2019-07-22 NOTE — Assessment & Plan Note (Signed)
Increase Losartan to 25 mg daily

## 2019-07-22 NOTE — Assessment & Plan Note (Signed)
Advair, Singulair and Albuterol refilled today

## 2019-07-22 NOTE — Assessment & Plan Note (Signed)
Encouraged her to continue to use CPAP machine She will continue to follow with pulmonology

## 2019-07-22 NOTE — Assessment & Plan Note (Signed)
Continue Excedrin Migraine 

## 2019-07-22 NOTE — Patient Instructions (Signed)
DASH Eating Plan DASH stands for "Dietary Approaches to Stop Hypertension." The DASH eating plan is a healthy eating plan that has been shown to reduce high blood pressure (hypertension). It may also reduce your risk for type 2 diabetes, heart disease, and stroke. The DASH eating plan may also help with weight loss. What are tips for following this plan?  General guidelines  Avoid eating more than 2,300 mg (milligrams) of salt (sodium) a day. If you have hypertension, you may need to reduce your sodium intake to 1,500 mg a day.  Limit alcohol intake to no more than 1 drink a day for nonpregnant women and 2 drinks a day for men. One drink equals 12 oz of beer, 5 oz of wine, or 1 oz of hard liquor.  Work with your health care provider to maintain a healthy body weight or to lose weight. Ask what an ideal weight is for you.  Get at least 30 minutes of exercise that causes your heart to beat faster (aerobic exercise) most days of the week. Activities may include walking, swimming, or biking.  Work with your health care provider or diet and nutrition specialist (dietitian) to adjust your eating plan to your individual calorie needs. Reading food labels   Check food labels for the amount of sodium per serving. Choose foods with less than 5 percent of the Daily Value of sodium. Generally, foods with less than 300 mg of sodium per serving fit into this eating plan.  To find whole grains, look for the word "whole" as the first word in the ingredient list. Shopping  Buy products labeled as "low-sodium" or "no salt added."  Buy fresh foods. Avoid canned foods and premade or frozen meals. Cooking  Avoid adding salt when cooking. Use salt-free seasonings or herbs instead of table salt or sea salt. Check with your health care provider or pharmacist before using salt substitutes.  Do not fry foods. Cook foods using healthy methods such as baking, boiling, grilling, and broiling instead.  Cook with  heart-healthy oils, such as olive, canola, soybean, or sunflower oil. Meal planning  Eat a balanced diet that includes: ? 5 or more servings of fruits and vegetables each day. At each meal, try to fill half of your plate with fruits and vegetables. ? Up to 6-8 servings of whole grains each day. ? Less than 6 oz of lean meat, poultry, or fish each day. A 3-oz serving of meat is about the same size as a deck of cards. One egg equals 1 oz. ? 2 servings of low-fat dairy each day. ? A serving of nuts, seeds, or beans 5 times each week. ? Heart-healthy fats. Healthy fats called Omega-3 fatty acids are found in foods such as flaxseeds and coldwater fish, like sardines, salmon, and mackerel.  Limit how much you eat of the following: ? Canned or prepackaged foods. ? Food that is high in trans fat, such as fried foods. ? Food that is high in saturated fat, such as fatty meat. ? Sweets, desserts, sugary drinks, and other foods with added sugar. ? Full-fat dairy products.  Do not salt foods before eating.  Try to eat at least 2 vegetarian meals each week.  Eat more home-cooked food and less restaurant, buffet, and fast food.  When eating at a restaurant, ask that your food be prepared with less salt or no salt, if possible. What foods are recommended? The items listed may not be a complete list. Talk with your dietitian about   what dietary choices are best for you. Grains Whole-grain or whole-wheat bread. Whole-grain or whole-wheat pasta. Brown rice. Oatmeal. Quinoa. Bulgur. Whole-grain and low-sodium cereals. Pita bread. Low-fat, low-sodium crackers. Whole-wheat flour tortillas. Vegetables Fresh or frozen vegetables (raw, steamed, roasted, or grilled). Low-sodium or reduced-sodium tomato and vegetable juice. Low-sodium or reduced-sodium tomato sauce and tomato paste. Low-sodium or reduced-sodium canned vegetables. Fruits All fresh, dried, or frozen fruit. Canned fruit in natural juice (without  added sugar). Meat and other protein foods Skinless chicken or turkey. Ground chicken or turkey. Pork with fat trimmed off. Fish and seafood. Egg whites. Dried beans, peas, or lentils. Unsalted nuts, nut butters, and seeds. Unsalted canned beans. Lean cuts of beef with fat trimmed off. Low-sodium, lean deli meat. Dairy Low-fat (1%) or fat-free (skim) milk. Fat-free, low-fat, or reduced-fat cheeses. Nonfat, low-sodium ricotta or cottage cheese. Low-fat or nonfat yogurt. Low-fat, low-sodium cheese. Fats and oils Soft margarine without trans fats. Vegetable oil. Low-fat, reduced-fat, or light mayonnaise and salad dressings (reduced-sodium). Canola, safflower, olive, soybean, and sunflower oils. Avocado. Seasoning and other foods Herbs. Spices. Seasoning mixes without salt. Unsalted popcorn and pretzels. Fat-free sweets. What foods are not recommended? The items listed may not be a complete list. Talk with your dietitian about what dietary choices are best for you. Grains Baked goods made with fat, such as croissants, muffins, or some breads. Dry pasta or rice meal packs. Vegetables Creamed or fried vegetables. Vegetables in a cheese sauce. Regular canned vegetables (not low-sodium or reduced-sodium). Regular canned tomato sauce and paste (not low-sodium or reduced-sodium). Regular tomato and vegetable juice (not low-sodium or reduced-sodium). Pickles. Olives. Fruits Canned fruit in a light or heavy syrup. Fried fruit. Fruit in cream or butter sauce. Meat and other protein foods Fatty cuts of meat. Ribs. Fried meat. Bacon. Sausage. Bologna and other processed lunch meats. Salami. Fatback. Hotdogs. Bratwurst. Salted nuts and seeds. Canned beans with added salt. Canned or smoked fish. Whole eggs or egg yolks. Chicken or turkey with skin. Dairy Whole or 2% milk, cream, and half-and-half. Whole or full-fat cream cheese. Whole-fat or sweetened yogurt. Full-fat cheese. Nondairy creamers. Whipped toppings.  Processed cheese and cheese spreads. Fats and oils Butter. Stick margarine. Lard. Shortening. Ghee. Bacon fat. Tropical oils, such as coconut, palm kernel, or palm oil. Seasoning and other foods Salted popcorn and pretzels. Onion salt, garlic salt, seasoned salt, table salt, and sea salt. Worcestershire sauce. Tartar sauce. Barbecue sauce. Teriyaki sauce. Soy sauce, including reduced-sodium. Steak sauce. Canned and packaged gravies. Fish sauce. Oyster sauce. Cocktail sauce. Horseradish that you find on the shelf. Ketchup. Mustard. Meat flavorings and tenderizers. Bouillon cubes. Hot sauce and Tabasco sauce. Premade or packaged marinades. Premade or packaged taco seasonings. Relishes. Regular salad dressings. Where to find more information:  National Heart, Lung, and Blood Institute: www.nhlbi.nih.gov  American Heart Association: www.heart.org Summary  The DASH eating plan is a healthy eating plan that has been shown to reduce high blood pressure (hypertension). It may also reduce your risk for type 2 diabetes, heart disease, and stroke.  With the DASH eating plan, you should limit salt (sodium) intake to 2,300 mg a day. If you have hypertension, you may need to reduce your sodium intake to 1,500 mg a day.  When on the DASH eating plan, aim to eat more fresh fruits and vegetables, whole grains, lean proteins, low-fat dairy, and heart-healthy fats.  Work with your health care provider or diet and nutrition specialist (dietitian) to adjust your eating plan to your   individual calorie needs. This information is not intended to replace advice given to you by your health care provider. Make sure you discuss any questions you have with your health care provider. Document Revised: 04/18/2017 Document Reviewed: 04/29/2016 Elsevier Patient Education  2020 Elsevier Inc.  

## 2019-07-22 NOTE — Assessment & Plan Note (Signed)
Avoid triggers Can take Omeprazole if needed

## 2019-07-22 NOTE — Assessment & Plan Note (Signed)
She will continue to follow with neurology. 

## 2019-07-22 NOTE — Assessment & Plan Note (Signed)
Will monitor

## 2019-07-22 NOTE — Assessment & Plan Note (Signed)
Continue Trazadone 

## 2019-07-22 NOTE — Assessment & Plan Note (Signed)
Encouraged low fat diet Continue Simvastatin and Fenofibrate

## 2019-08-02 ENCOUNTER — Ambulatory Visit: Payer: Managed Care, Other (non HMO) | Admitting: Neurology

## 2019-08-02 DIAGNOSIS — R410 Disorientation, unspecified: Secondary | ICD-10-CM

## 2019-08-02 DIAGNOSIS — R404 Transient alteration of awareness: Secondary | ICD-10-CM

## 2019-08-02 DIAGNOSIS — R413 Other amnesia: Secondary | ICD-10-CM

## 2019-08-03 ENCOUNTER — Other Ambulatory Visit: Payer: Self-pay

## 2019-08-03 ENCOUNTER — Encounter (INDEPENDENT_AMBULATORY_CARE_PROVIDER_SITE_OTHER): Payer: Self-pay | Admitting: Family Medicine

## 2019-08-03 ENCOUNTER — Telehealth (INDEPENDENT_AMBULATORY_CARE_PROVIDER_SITE_OTHER): Payer: Managed Care, Other (non HMO) | Admitting: Family Medicine

## 2019-08-03 ENCOUNTER — Encounter (INDEPENDENT_AMBULATORY_CARE_PROVIDER_SITE_OTHER): Payer: Self-pay

## 2019-08-03 DIAGNOSIS — I10 Essential (primary) hypertension: Secondary | ICD-10-CM

## 2019-08-03 DIAGNOSIS — F329 Major depressive disorder, single episode, unspecified: Secondary | ICD-10-CM | POA: Diagnosis not present

## 2019-08-03 DIAGNOSIS — F32A Depression, unspecified: Secondary | ICD-10-CM

## 2019-08-03 DIAGNOSIS — F419 Anxiety disorder, unspecified: Secondary | ICD-10-CM | POA: Diagnosis not present

## 2019-08-03 DIAGNOSIS — Z6839 Body mass index (BMI) 39.0-39.9, adult: Secondary | ICD-10-CM

## 2019-08-03 MED ORDER — LOSARTAN POTASSIUM 25 MG PO TABS: 25.0000 mg | ORAL_TABLET | Freq: Every day | ORAL | 5 refills | Status: DC

## 2019-08-03 NOTE — Progress Notes (Signed)
TeleHealth Visit:  Due to the COVID-19 pandemic, this visit was completed with telemedicine (audio/video) technology to reduce patient and provider exposure as well as to preserve personal protective equipment.   Donna Vasquez has verbally consented to this TeleHealth visit. The patient is located at home, the provider is located at the [facility] office. The participants in this visit include the listed provider and patient. The visit was conducted today via Face Time.  Chief Complaint: OBESITY Donna Vasquez is here to discuss her progress with her obesity treatment plan along with follow-up of her obesity related diagnoses. Shalin is on the Category 2 Plan and states she is following her eating plan approximately 65% of the time. Donna Vasquez states she is walking for 15 minutes 7 times per week.  Today's visit was #: 34 Starting weight: 248 lbs Starting date: 11/17/2017  Interim History: Donna Vasquez has had a very busy few months.  Her father had COVID, so she has been working from home and struggling to stay on plan (patient reports gaining all the weight she had previously lost).  Her aunt and cousin died in 2022-06-22.  The patient is currently undergoing workup for seizures.  Her husband is still doing the grocery shopping, so he isn't always buying all food on plan.  Subjective:   1. Essential hypertension Review: taking medications as instructed, no medication side effects noted, no chest pain on exertion, no dyspnea on exertion, no swelling of ankles.  Blood pressure was controlled previously.  BP Readings from Last 3 Encounters:  07/22/19 138/84  07/13/19 137/82  07/13/19 (!) 154/90   2. Anxiety and depression Symptoms have worsened with recent deaths in patient's family.  She is taking Zoloft.  Assessment/Plan:   1. Essential hypertension Donna Vasquez is working on healthy weight loss and exercise to improve blood pressure control. We will watch for signs of hypotension as she  continues her lifestyle modifications. - losartan (COZAAR) 25 MG tablet; Take 1 tablet (25 mg total) by mouth daily.  Dispense: 30 tablet; Refill: 5  2. Anxiety and depression Donna Vasquez is to call [facility] for referral to a psychiatrist.   3. Class 2 severe obesity with serious comorbidity and body mass index (BMI) of 39.0 to 39.9 in adult, unspecified obesity type [facility]) Autumm is currently in the action stage of change. As such, her goal is to continue with weight loss efforts. She has agreed to the Category 2 Plan.   Exercise goals: As is.  Behavioral modification strategies: increasing lean protein intake, increasing vegetables, meal planning and cooking strategies, keeping healthy foods in the home and planning for success.  Zilla has agreed to follow-up with our clinic in 2 weeks. She was informed of the importance of frequent follow-up visits to maximize her success with intensive lifestyle modifications for her multiple health conditions.  Objective:   VITALS: Per patient if applicable, see vitals. GENERAL: Alert and in no acute distress. CARDIOPULMONARY: No increased WOB. Speaking in clear sentences.  PSYCH: Pleasant and cooperative. Speech normal rate and rhythm. Affect is appropriate. Insight and judgement are appropriate. Attention is focused, linear, and appropriate.  NEURO: Oriented as arrived to appointment on time with no prompting.   Lab Results  Component Value Date   CREATININE 1.04 (H) 09/09/2018   BUN 14 09/09/2018   NA 144 09/09/2018   K 4.0 09/09/2018   CL 104 09/09/2018   CO2 24 09/09/2018   Lab Results  Component Value Date   ALT 19 09/09/2018  AST 21 09/09/2018   ALKPHOS 102 09/09/2018   BILITOT 0.3 09/09/2018   Lab Results  Component Value Date   HGBA1C 5.1 09/09/2018   HGBA1C 5.0 04/02/2018   HGBA1C 5.1 11/17/2017   HGBA1C 5.0 11/13/2015   Lab Results  Component Value Date   INSULIN 17.3 09/09/2018   INSULIN 10.1 04/02/2018   INSULIN 8.7  11/17/2017   Lab Results  Component Value Date   TSH 1.700 07/13/2019   Lab Results  Component Value Date   CHOL 153 09/09/2018   HDL 53 09/09/2018   LDLCALC 68 09/09/2018   TRIG 160 (H) 09/09/2018   CHOLHDL 2.6 04/02/2018   Lab Results  Component Value Date   WBC 6.0 10/16/2017   HGB 13.4 10/16/2017   HCT 40.2 10/16/2017   MCV 92 10/16/2017   PLT 229 10/16/2017   Attestation Statements:   Reviewed by clinician on day of visit: allergies, medications, problem list, medical history, surgical history, family history, social history, and previous encounter notes.  I, Insurance claims handler, CMA, am acting as transcriptionist for Reuben Likes, MD.  I have reviewed the above documentation for accuracy and completeness, and I agree with the above. - Debbra Riding, MD

## 2019-08-06 ENCOUNTER — Telehealth: Payer: Self-pay | Admitting: Neurology

## 2019-08-06 DIAGNOSIS — R404 Transient alteration of awareness: Secondary | ICD-10-CM

## 2019-08-06 MED ORDER — TOPIRAMATE 100 MG PO TABS: 100.0000 mg | ORAL_TABLET | Freq: Two times a day (BID) | ORAL | 11 refills | Status: DC

## 2019-08-06 NOTE — Telephone Encounter (Signed)
I failed to reach patient, left message for her to call back.   I also left message at Donna Vasquez [phone].   CONCLUSION: This is an abnormal EEG, there is evidence of frequent generalized spike slow wave activity, consistent with epilepsy disorder.  Please call her for abnormal EEG, I have started her on Topamax 100mg  bid.  Give her a follow up with me about one month after medications

## 2019-08-06 NOTE — Telephone Encounter (Signed)
abnormL EEG

## 2019-08-06 NOTE — Addendum Note (Signed)
Addended by: Levert Feinstein on: 08/06/2019 01:26 PM   Modules accepted: Orders

## 2019-08-06 NOTE — Procedures (Signed)
   HISTORY: 54 year old female, complains of frequent staring spells, word finding difficulties.  TECHNIQUE:  This is a routine 16 channel EEG recording with one channel devoted to a limited EKG recording.  It was performed during wakefulness, drowsiness and asleep.  Hyperventilation and photic stimulation were performed as activating procedures.  There are minimum muscle and movement artifact noted.  Upon maximum arousal, posterior dominant waking rhythm consistent of rhythmic alpha range activity, with frequency of 11 hz. Activities are symmetric over the bilateral posterior derivations and attenuated with eye opening.  Hyperventilation produced mild/moderate buildup with higher amplitude and the slower activities noted.  Photic stimulation did not alter the tracing.  During EEG recording, when patient entered into drowsy state, there was frequent protrusion of generalized spike slow wave activity, lasting 2 to 4 seconds, this happened more frequent after the tracing 14.01, there was no seizure activity described.  No deeper stage of sleep was achieved,  EKG demonstrate sinus rhythm, with heart rate of 56 bpm.  CONCLUSION: This is an abnormal EEG, there is evidence of frequent generalized spike slow wave activity, consistent with epilepsy disorder.  Levert Feinstein, M.D. Ph.D.  [facility] [address] [place], [place] [address] Phone: [phone] Fax:      [phone]

## 2019-08-09 NOTE — Telephone Encounter (Signed)
I spoke to the patient and provided her with the EEG results below. She verbalized understanding of the results, along with the importance of starting topiramate and taking it as prescribed. She also understands that  Law states she is unable to drive until she has been episode free for six months. She has scheduled a follow up w/ Dr. Terrace Arabia on 09/15/19. She knows that she can call or email [name] with any questions or concerns.

## 2019-08-09 NOTE — Telephone Encounter (Signed)
Pt called in regards to missed call   Alt cb# [phone]

## 2019-08-10 ENCOUNTER — Telehealth (INDEPENDENT_AMBULATORY_CARE_PROVIDER_SITE_OTHER): Payer: Self-pay | Admitting: Family Medicine

## 2019-08-10 NOTE — Telephone Encounter (Signed)
Please advise 

## 2019-08-13 ENCOUNTER — Encounter (INDEPENDENT_AMBULATORY_CARE_PROVIDER_SITE_OTHER): Payer: Self-pay | Admitting: Family Medicine

## 2019-08-16 NOTE — Telephone Encounter (Signed)
Please advise 

## 2019-08-17 ENCOUNTER — Ambulatory Visit (INDEPENDENT_AMBULATORY_CARE_PROVIDER_SITE_OTHER): Payer: Managed Care, Other (non HMO) | Admitting: Family Medicine

## 2019-08-17 NOTE — Telephone Encounter (Signed)
Donna Vasquez: [id number] (exp. 08/16/19 to 02/12/20) patient is scheduled at GI for 08/20/19.

## 2019-08-18 ENCOUNTER — Encounter (INDEPENDENT_AMBULATORY_CARE_PROVIDER_SITE_OTHER): Payer: Self-pay | Admitting: Family Medicine

## 2019-08-18 ENCOUNTER — Other Ambulatory Visit: Payer: Self-pay

## 2019-08-18 ENCOUNTER — Telehealth (INDEPENDENT_AMBULATORY_CARE_PROVIDER_SITE_OTHER): Payer: Managed Care, Other (non HMO) | Admitting: Family Medicine

## 2019-08-18 DIAGNOSIS — E559 Vitamin D deficiency, unspecified: Secondary | ICD-10-CM

## 2019-08-18 DIAGNOSIS — I10 Essential (primary) hypertension: Secondary | ICD-10-CM

## 2019-08-18 DIAGNOSIS — Z6839 Body mass index (BMI) 39.0-39.9, adult: Secondary | ICD-10-CM | POA: Diagnosis not present

## 2019-08-18 NOTE — Progress Notes (Signed)
TeleHealth Visit:  Due to the COVID-19 pandemic, this visit was completed with telemedicine (audio/video) technology to reduce patient and provider exposure as well as to preserve personal protective equipment.   Donna Vasquez has verbally consented to this TeleHealth visit. The patient is located at home, the provider is located at the [facility] office. The participants in this visit include the listed provider and patient. The visit was conducted today via Face Time.  Chief Complaint: OBESITY Frimy is here to discuss her progress with her obesity treatment plan along with follow-up of her obesity related diagnoses. Donna Vasquez is on the Category 2 Plan and states she is following her eating plan approximately 80-85% of the time. Donna Vasquez states she is walking 1/4 - 1/2 mile 3-4 times per week.  Today's visit was #: 65 Starting weight: 248 lbs Starting date: 11/17/2017  Interim History: Donna Vasquez voices that she was recently diagnosed with epilepsy and started on a new medication.  She did not give a weight today.  She reports following the meal plan 80-85%.  Her husband has been very supportive and helps the patient get food for the meal plan.  Subjective:   1. Vitamin D deficiency Donna Vasquez's Vitamin D level was 31.5 on 09/09/2018. She is currently taking prescription vitamin D 50,000 IU each week. She denies nausea, vomiting or muscle weakness.  She endorses fatigue.  2. Essential hypertension Review: taking medications as instructed, no medication side effects noted, no chest pain on exertion, no dyspnea on exertion, no swelling of ankles.  Blood pressure has been labile in the past.  BP Readings from Last 3 Encounters:  07/22/19 138/84  07/13/19 137/82  07/13/19 (!) 154/90   Assessment/Plan:   1. Vitamin D deficiency Low Vitamin D level contributes to fatigue and are associated with obesity, breast, and colon cancer. She agrees to continue to take prescription Vitamin D  @50 ,000 IU every week and will follow-up for routine testing of Vitamin D, at least 2-3 times per year to avoid over-replacement.  2. Essential hypertension Donna Vasquez is working on healthy weight loss and exercise to improve blood pressure control. We will watch for signs of hypotension as she continues her lifestyle modifications.  Will follow-up at next appointment when patient comes into the office.  3. Class 2 severe obesity with serious comorbidity and body mass index (BMI) of 39.0 to 39.9 in adult, unspecified obesity type [facility]) Donna Vasquez is currently in the action stage of change. As such, her goal is to continue with weight loss efforts. She has agreed to the Category 2 Plan.   Exercise goals: As is.  Behavioral modification strategies: increasing lean protein intake, meal planning and cooking strategies, keeping healthy foods in the home and planning for success.  Donna Vasquez has agreed to follow-up with our clinic in 4 weeks. She was informed of the importance of frequent follow-up visits to maximize her success with intensive lifestyle modifications for her multiple health conditions.  Objective:   VITALS: Per patient if applicable, see vitals. GENERAL: Alert and in no acute distress. CARDIOPULMONARY: No increased WOB. Speaking in clear sentences.  PSYCH: Pleasant and cooperative. Speech normal rate and rhythm. Affect is appropriate. Insight and judgement are appropriate. Attention is focused, linear, and appropriate.  NEURO: Oriented as arrived to appointment on time with no prompting.   Lab Results  Component Value Date   CREATININE 1.04 (H) 09/09/2018   BUN 14 09/09/2018   NA 144 09/09/2018   K 4.0 09/09/2018   CL  104 09/09/2018   CO2 24 09/09/2018   Lab Results  Component Value Date   ALT 19 09/09/2018   AST 21 09/09/2018   ALKPHOS 102 09/09/2018   BILITOT 0.3 09/09/2018   Lab Results  Component Value Date   HGBA1C 5.1 09/09/2018   HGBA1C 5.0 04/02/2018   HGBA1C 5.1  11/17/2017   HGBA1C 5.0 11/13/2015   Lab Results  Component Value Date   INSULIN 17.3 09/09/2018   INSULIN 10.1 04/02/2018   INSULIN 8.7 11/17/2017   Lab Results  Component Value Date   TSH 1.700 07/13/2019   Lab Results  Component Value Date   CHOL 153 09/09/2018   HDL 53 09/09/2018   LDLCALC 68 09/09/2018   TRIG 160 (H) 09/09/2018   CHOLHDL 2.6 04/02/2018   Lab Results  Component Value Date   WBC 6.0 10/16/2017   HGB 13.4 10/16/2017   HCT 40.2 10/16/2017   MCV 92 10/16/2017   PLT 229 10/16/2017   Attestation Statements:   Reviewed by clinician on day of visit: allergies, medications, problem list, medical history, surgical history, family history, social history, and previous encounter notes.  I, Insurance claims handler, CMA, am acting as transcriptionist for Reuben Likes, MD.  I have reviewed the above documentation for accuracy and completeness, and I agree with the above. - Debbra Riding, MD

## 2019-08-20 ENCOUNTER — Other Ambulatory Visit: Payer: Self-pay

## 2019-08-20 ENCOUNTER — Ambulatory Visit
Admission: RE | Admit: 2019-08-20 | Discharge: 2019-08-20 | Disposition: A | Discharge status: Home / Self Care | Payer: Managed Care, Other (non HMO) | Source: Ambulatory Visit | Attending: Neurology | Admitting: Neurology

## 2019-08-20 DIAGNOSIS — R404 Transient alteration of awareness: Secondary | ICD-10-CM

## 2019-08-20 DIAGNOSIS — R413 Other amnesia: Secondary | ICD-10-CM

## 2019-08-25 ENCOUNTER — Other Ambulatory Visit: Payer: Self-pay

## 2019-08-25 MED ORDER — TRAZODONE HCL 50 MG PO TABS: 25.0000 mg | ORAL_TABLET | Freq: Every evening | ORAL | 0 refills | Status: DC | PRN

## 2019-08-30 ENCOUNTER — Telehealth: Payer: Self-pay | Admitting: Neurology

## 2019-08-30 MED ORDER — LAMOTRIGINE 25 MG PO TABS: ORAL_TABLET | ORAL | 0 refills | Status: DC

## 2019-08-30 MED ORDER — LAMOTRIGINE 100 MG PO TABS: 100.0000 mg | ORAL_TABLET | Freq: Two times a day (BID) | ORAL | 5 refills | Status: DC

## 2019-08-30 NOTE — Telephone Encounter (Signed)
Pt called stating she has several questions and concerns that she would like to discuss with the RN about her medications. Please advise.

## 2019-08-30 NOTE — Telephone Encounter (Signed)
The patient is currently taking topiramate 100mg , one tablet BID. She is having issues with the medication: foggy minded, cognitive delay, word finding difficulty. These side effects are causing her to have quality and production issues at work.  Dr. is work-in this morning.  Per vo by Dr. Epimenio Foot, she should make the following changes:  1) titrate lamotrigine 25mg : one tab BID x one week, then two tabs BID x one week, then three tabs BID x one week, then convert to lamotrigine 100mg , one tablet BID.  2) continue taking topiramate 100mg , one tab QHS until she has titrated up to lamotrigine 100mg , one tab BID.  New prescriptions for lamotrigine will be sent to pharmacy. The patient verbalized understanding of the dosing schedule and repeated it back to me correctly.

## 2019-08-31 ENCOUNTER — Ambulatory Visit (INDEPENDENT_AMBULATORY_CARE_PROVIDER_SITE_OTHER): Payer: Managed Care, Other (non HMO) | Admitting: Internal Medicine

## 2019-08-31 ENCOUNTER — Encounter: Payer: Self-pay | Admitting: Internal Medicine

## 2019-08-31 ENCOUNTER — Other Ambulatory Visit: Payer: Self-pay

## 2019-08-31 VITALS — BP 128/78 | HR 86 | Temp 97.7°F | Ht 68.0 in | Wt 262.0 lb

## 2019-08-31 DIAGNOSIS — Z0000 Encounter for general adult medical examination without abnormal findings: Secondary | ICD-10-CM

## 2019-08-31 DIAGNOSIS — Z1231 Encounter for screening mammogram for malignant neoplasm of breast: Secondary | ICD-10-CM

## 2019-08-31 DIAGNOSIS — Z23 Encounter for immunization: Secondary | ICD-10-CM

## 2019-08-31 NOTE — Addendum Note (Signed)
Addended by: Alvina Chou on: 08/31/2019 10:04 AM   Modules accepted: Orders

## 2019-08-31 NOTE — Patient Instructions (Signed)
Health Maintenance, Female Adopting a healthy lifestyle and getting preventive care are important in promoting health and wellness. Ask your health care provider about:  The right schedule for you to have regular tests and exams.  Things you can do on your own to prevent diseases and keep yourself healthy. What should I know about diet, weight, and exercise? Eat a healthy diet   Eat a diet that includes plenty of vegetables, fruits, low-fat dairy products, and lean protein.  Do not eat a lot of foods that are high in solid fats, added sugars, or sodium. Maintain a healthy weight Body mass index (BMI) is used to identify weight problems. It estimates body fat based on height and weight. Your health care provider can help determine your BMI and help you achieve or maintain a healthy weight. Get regular exercise Get regular exercise. This is one of the most important things you can do for your health. Most adults should:  Exercise for at least 150 minutes each week. The exercise should increase your heart rate and make you sweat (moderate-intensity exercise).  Do strengthening exercises at least twice a week. This is in addition to the moderate-intensity exercise.  Spend less time sitting. Even light physical activity can be beneficial. Watch cholesterol and blood lipids Have your blood tested for lipids and cholesterol at 55 years of age, then have this test every 5 years. Have your cholesterol levels checked more often if:  Your lipid or cholesterol levels are high.  You are older than 55 years of age.  You are at high risk for heart disease. What should I know about cancer screening? Depending on your health history and family history, you may need to have cancer screening at various ages. This may include screening for:  Breast cancer.  Cervical cancer.  Colorectal cancer.  Skin cancer.  Lung cancer. What should I know about heart disease, diabetes, and high blood  pressure? Blood pressure and heart disease  High blood pressure causes heart disease and increases the risk of stroke. This is more likely to develop in people who have high blood pressure readings, are of African descent, or are overweight.  Have your blood pressure checked: ? Every 3-5 years if you are 18-39 years of age. ? Every year if you are 40 years old or older. Diabetes Have regular diabetes screenings. This checks your fasting blood sugar level. Have the screening done:  Once every three years after age 40 if you are at a normal weight and have a low risk for diabetes.  More often and at a younger age if you are overweight or have a high risk for diabetes. What should I know about preventing infection? Hepatitis B If you have a higher risk for hepatitis B, you should be screened for this virus. Talk with your health care provider to find out if you are at risk for hepatitis B infection. Hepatitis C Testing is recommended for:  Everyone born from 1945 through 1965.  Anyone with known risk factors for hepatitis C. Sexually transmitted infections (STIs)  Get screened for STIs, including gonorrhea and chlamydia, if: ? You are sexually active and are younger than 55 years of age. ? You are older than 55 years of age and your health care provider tells you that you are at risk for this type of infection. ? Your sexual activity has changed since you were last screened, and you are at increased risk for chlamydia or gonorrhea. Ask your health care provider if   you are at risk.  Ask your health care provider about whether you are at high risk for HIV. Your health care provider may recommend a prescription medicine to help prevent HIV infection. If you choose to take medicine to prevent HIV, you should first get tested for HIV. You should then be tested every 3 months for as long as you are taking the medicine. Pregnancy  If you are about to stop having your period (premenopausal) and  you may become pregnant, seek counseling before you get pregnant.  Take 400 to 800 micrograms (mcg) of folic acid every day if you become pregnant.  Ask for birth control (contraception) if you want to prevent pregnancy. Osteoporosis and menopause Osteoporosis is a disease in which the bones lose minerals and strength with aging. This can result in bone fractures. If you are 65 years old or older, or if you are at risk for osteoporosis and fractures, ask your health care provider if you should:  Be screened for bone loss.  Take a calcium or vitamin D supplement to lower your risk of fractures.  Be given hormone replacement therapy (HRT) to treat symptoms of menopause. Follow these instructions at home: Lifestyle  Do not use any products that contain nicotine or tobacco, such as cigarettes, e-cigarettes, and chewing tobacco. If you need help quitting, ask your health care provider.  Do not use street drugs.  Do not share needles.  Ask your health care provider for help if you need support or information about quitting drugs. Alcohol use  Do not drink alcohol if: ? Your health care provider tells you not to drink. ? You are pregnant, may be pregnant, or are planning to become pregnant.  If you drink alcohol: ? Limit how much you use to 0-1 drink a day. ? Limit intake if you are breastfeeding.  Be aware of how much alcohol is in your drink. In the [place] one drink equals one 12 oz bottle of beer (355 mL), one 5 oz glass of wine (148 mL), or one 1 oz glass of hard liquor (44 mL). General instructions  Schedule regular health, dental, and eye exams.  Stay current with your vaccines.  Tell your health care provider if: ? You often feel depressed. ? You have ever been abused or do not feel safe at home. Summary  Adopting a healthy lifestyle and getting preventive care are important in promoting health and wellness.  Follow your health care provider's instructions about healthy  diet, exercising, and getting tested or screened for diseases.  Follow your health care provider's instructions on monitoring your cholesterol and blood pressure. This information is not intended to replace advice given to you by your health care provider. Make sure you discuss any questions you have with your health care provider. Document Revised: 04/29/2018 Document Reviewed: 04/29/2018 Elsevier Patient Education  2020 [employer]  

## 2019-08-31 NOTE — Addendum Note (Signed)
Addended by: Roena Malady on: 08/31/2019 09:54 AM   Modules accepted: Orders

## 2019-08-31 NOTE — Progress Notes (Signed)
Subjective:    Patient ID: Donna Vasquez, female    DOB: October 31, 1965, 54 y.o.   MRN: [id number]  HPI  Pt presents to the clinic today for her annual exam.  Flu: 02/2019 Tetanus: 07/2010 Pneumovax: 12/2012 Covid: None Pap Smear: 09/2017 Mammogram: 09/2017 Colon Screening: 2017, Dr. Tiffany Kocher Vision Screening: annually Dentist: biannually  Diet: She does eat meat. She consume more fruits than veggies. She tries to avoid fried foods. She drinks mostly water. Exercise: None  Review of Systems      Past Medical History:  Diagnosis Date  . Allergy   . Asthma   . Back pain   . Bulging lumbar disc   . Chicken pox   . Constipation   . Depression   . Fatigue   . Frequent headaches   . Gall bladder disease   . Headache   . Heartburn   . Hyperlipidemia   . IBS (irritable bowel syndrome)   . Interstitial cystitis   . RA (rheumatoid arthritis) ([place])   . Shortness of breath on exertion   . Staring episodes   . Trouble in sleeping   . Urine incontinence   . Vertigo   . Vision changes   . Vitamin D deficiency     Current Outpatient Medications  Medication Sig Dispense Refill  . albuterol (PROAIR HFA) 108 (90 Base) MCG/ACT inhaler Inhale 1-2 puffs into the lungs every 6 (six) hours as needed for wheezing or shortness of breath. 8.7 g 2  . aspirin 81 MG tablet Take 81 mg by mouth daily.    [name] aspirin-acetaminophen-caffeine (EXCEDRIN MIGRAINE) 250-250-65 MG tablet Take by mouth every 6 (six) hours as needed for headache.    [name] buPROPion (WELLBUTRIN XL) 300 MG 24 hr tablet TAKE 1 TABLET BY MOUTH  DAILY 30 tablet 11  . dextromethorphan-guaiFENesin (MUCINEX DM) 30-600 MG 12hr tablet Take 1 tablet by mouth 2 (two) times daily as needed for cough.    . Fluticasone-Salmeterol (ADVAIR) 100-50 MCG/DOSE AEPB Inhale 1 puff into the lungs 2 (two) times daily. 60 each 5  . lamoTRIgine (LAMICTAL) 100 MG tablet Take 1 tablet (100 mg total) by mouth 2 (two) times daily. 60 tablet 5  .  lamoTRIgine (LAMICTAL) 25 MG tablet One tab BID x 7 days, then two tabs BID x 7 days, then three tabs BID x 7, then switch to 175m tabs at one BID. 84 tablet 0  . losartan (COZAAR) 25 MG tablet Take 1 tablet (25 mg total) by mouth daily. 30 tablet 5  . montelukast (SINGULAIR) 10 MG tablet TAKE 1 TABLET (10 MG TOTAL) BY MOUTH AT BEDTIME. 30 tablet 5  . niacin (NIASPAN) 1000 MG CR tablet TAKE 1 TABLET BY MOUTH AT  BEDTIME 90 tablet 3  . omeprazole (PRILOSEC) 20 MG capsule Take 1 capsule (20 mg total) by mouth daily. 30 capsule 2  . sertraline (ZOLOFT) 100 MG tablet TAKE 1 TABLET BY MOUTH  DAILY 90 tablet 3  . simvastatin (ZOCOR) 20 MG tablet TAKE 1 TABLET BY MOUTH  DAILY AT 6 PM. 90 tablet 3  . topiramate (TOPAMAX) 100 MG tablet Take 1 tablet (100 mg total) by mouth 2 (two) times daily. 60 tablet 11  . traZODone (DESYREL) 50 MG tablet Take 0.5-1 tablets (25-50 mg total) by mouth at bedtime as needed for sleep. 90 tablet 0   No current facility-administered medications for this visit.    Allergies  Allergen Reactions  . Flonase [Fluticasone Propionate] Other (See Comments)  Nasal irritation, burning, but can tolerate advair inhaler.    . Sulfa Antibiotics Hives    Family History  Problem Relation Age of Onset  . COPD Mother   . Heart disease Mother   . Polymyalgia rheumatica Mother   . Emphysema Mother   . Stroke Mother   . Depression Mother   . Anxiety disorder Mother   . Heart disease Father   . Hypertension Father   . Hyperlipidemia Father   . Anxiety disorder Father   . Cancer Paternal Aunt        Breast  . Breast cancer Paternal Aunt 74  . Stroke Maternal Grandmother   . Cancer Maternal Aunt   . Other Brother        melas syndrom  . Parkinson's disease Maternal Grandfather     Social History   Socioeconomic History  . Marital status: Married    Spouse name: Linna Hoff  . Number of children: 2  . Years of education: college  . Highest education level: Bachelor's degree  (e.g., BA, AB, BS)  Occupational History  . Occupation: Patent attorney  Tobacco Use  . Smoking status: Never Smoker  . Smokeless tobacco: Never Used  Substance and Sexual Activity  . Alcohol use: Yes    Alcohol/week: 0.0 standard drinks    Comment: rare--wine  . Drug use: No  . Sexual activity: Not on file  Other Topics Concern  . Not on file  Social History Narrative   Lives at home with family.   Right-handed.   Caffeine use: Diet Pepsi - 8 ounces to 2 liters per day - varies.   Social Determinants of Health   Financial Resource Strain:   . Difficulty of Paying Living Expenses:   Food Insecurity:   . Worried About Charity fundraiser in the Last Year:   . Arboriculturist in the Last Year:   Transportation Needs:   . Film/video editor (Medical):   [name] Lack of Transportation (Non-Medical):   Physical Activity:   . Days of Exercise per Week:   . Minutes of Exercise per Session:   Stress:   . Feeling of Stress :   Social Connections:   . Frequency of Communication with Friends and Family:   . Frequency of Social Gatherings with Friends and Family:   . Attends Religious Services:   . Active Member of Clubs or Organizations:   . Attends Archivist Meetings:   [name] Marital Status:   Intimate Partner Violence:   . Fear of Current or Ex-Partner:   . Emotionally Abused:   [name] Physically Abused:   . Sexually Abused:      Constitutional: Pt reports frequent headaches. Denies fever, malaise, fatigue, or abrupt weight changes.  HEENT: Denies eye pain, eye redness, ear pain, ringing in the ears, wax buildup, runny nose, nasal congestion, bloody nose, or sore throat. Respiratory: Pt reports intermittent cough. PDenies difficulty breathing, shortness of breath, or sputum production.   Cardiovascular: Denies chest pain, chest tightness, palpitations or swelling in the hands or feet.  Gastrointestinal: Pt reports alternating constipation and diarrhea.  Denies abdominal pain, bloating, or blood in the stool.  GU: Denies urgency, frequency, pain with urination, burning sensation, blood in urine, odor or discharge. Musculoskeletal: Pt reports intermittent joint pain. Denies decrease in range of motion, difficulty with gait, muscle pain or joint swelling.  Skin: Denies redness, rashes, lesions or ulcercations.  Neurological: Pt reports insomnia. Denies dizziness, difficulty with memory, difficulty  with speech or problems with balance and coordination.  Psych: Pt has a history of anxiety and depression. Denies SI/HI.  No other specific complaints in a complete review of systems (except as listed in HPI above).  Objective:   Physical Exam   BP 128/78   Pulse 86   Temp 97.7 F (36.5 C) (Temporal)   Ht _0  (1.727 m)   Wt 262 lb (118.8 kg)   SpO2 98%   BMI 39.84 kg/m   Wt Readings from Last 3 Encounters:  07/22/19 264 lb (119.7 kg)  07/13/19 259 lb (117.5 kg)  07/13/19 263 lb (119.3 kg)    General: Appears her stated age, obese, in NAD. Skin: Warm, dry and intact. No rashes noted. HEENT: Head: normal shape and size; Eyes: sclera white, no icterus, conjunctiva pink, PERRLA and EOMs intact;  Neck:  Neck supple, trachea midline. No masses, lumps or thyromegaly present.  Cardiovascular: Normal rate and rhythm. S1,S2 noted.  No murmur, rubs or gallops noted. No JVD or BLE edema. No carotid bruits noted. Pulmonary/Chest: Normal effort and positive vesicular breath sounds. No respiratory distress. No wheezes, rales or ronchi noted.  Abdomen: Soft and nontender. Normal bowel sounds. No distention or masses noted. Liver, spleen and kidneys non palpable. Musculoskeletal: Strength 5/5 BUE/BLE. No difficulty with gait.  Neurological: Alert and oriented. Cranial nerves II-XII grossly intact. Coordination normal.  Psychiatric: Mood and affect normal. Behavior is normal. Judgment and thought content normal.     BMET    Component Value  Date/Time   NA 144 09/09/2018 0932   K 4.0 09/09/2018 0932   CL 104 09/09/2018 0932   CO2 24 09/09/2018 0932   GLUCOSE 89 09/09/2018 0932   GLUCOSE 93 11/13/2015 1501   BUN 14 09/09/2018 0932   CREATININE 1.04 (H) 09/09/2018 0932   CALCIUM 9.3 09/09/2018 0932   GFRNONAA 61 09/09/2018 0932   GFRAA 71 09/09/2018 0932    Lipid Panel     Component Value Date/Time   CHOL 153 09/09/2018 0932   TRIG 160 (H) 09/09/2018 0932   HDL 53 09/09/2018 0932   CHOLHDL 2.6 04/02/2018 0844   CHOLHDL 3 11/13/2015 1501   VLDL 29.8 11/13/2015 1501   LDLCALC 68 09/09/2018 0932    CBC    Component Value Date/Time   WBC 6.0 10/16/2017 1000   WBC 8.6 11/13/2015 1501   RBC 4.39 10/16/2017 1000   RBC 4.49 11/13/2015 1501   HGB 13.4 10/16/2017 1000   HCT 40.2 10/16/2017 1000   PLT 229 10/16/2017 1000   MCV 92 10/16/2017 1000   MCH 30.5 10/16/2017 1000   MCHC 33.3 10/16/2017 1000   MCHC 34.0 11/13/2015 1501   RDW 13.3 10/16/2017 1000   LYMPHSABS 1.4 10/16/2017 1000   EOSABS 0.2 10/16/2017 1000   BASOSABS 0.0 10/16/2017 1000    Hgb A1C Lab Results  Component Value Date   HGBA1C 5.1 09/09/2018           Assessment & Plan:   Preventative Health Maintenance:  Encouraged her to get a flu shot in the fall Tetanus due 2022 Pneumovax today Encouraged her to get her Covid vaccine Pap smear UTD Mammogram ordered she will call [facility] to schedule, number provided Will request copy of colonoscopy Encouraged her to consume a balanced diet and exercise regimen Advised her to see an eye doctor and dentist annually We will check CBC, C met, lipid, A1c and vitamin D today  RTC in 1 year, sooner if needed  Webb Silversmith, NP This visit occurred during the SARS-CoV-2 public health emergency.  Safety protocols were in place, including screening questions prior to the visit, additional usage of staff PPE, and extensive cleaning of exam room while observing appropriate contact time as indicated for  disinfecting solutions.

## 2019-09-01 ENCOUNTER — Encounter: Payer: Self-pay | Admitting: Internal Medicine

## 2019-09-01 LAB — CBC
Hematocrit: 39.9 % (ref 34.0–46.6)
Hemoglobin: 13.7 g/dL (ref 11.1–15.9)
MCH: 32.4 pg (ref 26.6–33.0)
MCHC: 34.3 g/dL (ref 31.5–35.7)
MCV: 94 fL (ref 79–97)
Platelets: 233 10*3/uL (ref 150–450)
RBC: 4.23 x10E6/uL (ref 3.77–5.28)
RDW: 12.5 % (ref 11.7–15.4)
WBC: 6.7 10*3/uL (ref 3.4–10.8)

## 2019-09-01 LAB — LIPID PANEL
Chol/HDL Ratio: 3.2 ratio (ref 0.0–4.4)
Cholesterol, Total: 174 mg/dL (ref 100–199)
HDL: 55 mg/dL (ref >39)
LDL Chol Calc (NIH): 93 mg/dL (ref 0–99)
Triglycerides: 151 mg/dL — ABNORMAL HIGH (ref 0–149)
VLDL Cholesterol Cal: 26 mg/dL (ref 5–40)

## 2019-09-01 LAB — COMPREHENSIVE METABOLIC PANEL
ALT: 14 IU/L (ref 0–32)
AST: 14 IU/L (ref 0–40)
Albumin/Globulin Ratio: 1.9 (ref 1.2–2.2)
Albumin: 4.3 g/dL (ref 3.8–4.9)
Alkaline Phosphatase: 116 IU/L (ref 39–117)
BUN/Creatinine Ratio: 15 (ref 9–23)
BUN: 17 mg/dL (ref 6–24)
Bilirubin Total: 0.3 mg/dL (ref 0.0–1.2)
CO2: 24 mmol/L (ref 20–29)
Calcium: 9.1 mg/dL (ref 8.7–10.2)
Chloride: 108 mmol/L — ABNORMAL HIGH (ref 96–106)
Creatinine, Ser: 1.12 mg/dL — ABNORMAL HIGH (ref 0.57–1.00)
GFR calc Af Amer: 64 mL/min/{1.73_m2} (ref >59)
GFR calc non Af Amer: 56 mL/min/{1.73_m2} — ABNORMAL LOW (ref >59)
Globulin, Total: 2.3 g/dL (ref 1.5–4.5)
Glucose: 100 mg/dL — ABNORMAL HIGH (ref 65–99)
Potassium: 4.7 mmol/L (ref 3.5–5.2)
Sodium: 142 mmol/L (ref 134–144)
Total Protein: 6.6 g/dL (ref 6.0–8.5)

## 2019-09-01 LAB — HEMOGLOBIN A1C
Est. average glucose Bld gHb Est-mCnc: 94 mg/dL
Hgb A1c MFr Bld: 4.9 % (ref 4.8–5.6)

## 2019-09-01 LAB — VITAMIN D 25 HYDROXY (VIT D DEFICIENCY, FRACTURES): Vit D, 25-Hydroxy: 42.3 ng/mL (ref 30.0–100.0)

## 2019-09-13 ENCOUNTER — Other Ambulatory Visit: Payer: Self-pay

## 2019-09-13 ENCOUNTER — Encounter (INDEPENDENT_AMBULATORY_CARE_PROVIDER_SITE_OTHER): Payer: Self-pay | Admitting: Family Medicine

## 2019-09-13 ENCOUNTER — Encounter: Payer: Self-pay | Admitting: Neurology

## 2019-09-13 ENCOUNTER — Ambulatory Visit (INDEPENDENT_AMBULATORY_CARE_PROVIDER_SITE_OTHER): Payer: Managed Care, Other (non HMO) | Admitting: Family Medicine

## 2019-09-13 ENCOUNTER — Ambulatory Visit: Payer: Managed Care, Other (non HMO) | Admitting: Neurology

## 2019-09-13 VITALS — BP 129/82 | HR 79 | Temp 97.9°F | Ht 68.0 in | Wt 260.0 lb

## 2019-09-13 VITALS — BP 116/71 | HR 70 | Temp 98.1°F | Ht 68.0 in | Wt 257.0 lb

## 2019-09-13 DIAGNOSIS — G40309 Generalized idiopathic epilepsy and epileptic syndromes, not intractable, without status epilepticus: Secondary | ICD-10-CM

## 2019-09-13 DIAGNOSIS — E8881 Metabolic syndrome: Secondary | ICD-10-CM | POA: Diagnosis not present

## 2019-09-13 DIAGNOSIS — Z6839 Body mass index (BMI) 39.0-39.9, adult: Secondary | ICD-10-CM | POA: Diagnosis not present

## 2019-09-13 MED ORDER — LAMOTRIGINE 100 MG PO TABS: 100.0000 mg | ORAL_TABLET | Freq: Two times a day (BID) | ORAL | 4 refills | Status: DC

## 2019-09-13 NOTE — Progress Notes (Signed)
PATIENT: Donna Vasquez DOB: Aug 18, 1965  Chief Complaint  Patient presents with  . Memory Loss/Staring Spells    She is here with her husband, [name], to review her EEG and MRI brain results. She has continued taking topiramate 100mg  at bedtime. Her cognitive fog has improved with the decrease in topiramate. She is in the process of titrating up her lamotrigine dosage. She starts 75mg  BID tomorrow. No issues so far with the medication.      HISTORICAL  Donna Vasquez is a 54 year old female, seen in request by bariatrics physician [name], 56, and her primary care nurse practitioner [name] for evaluation of frequent staring spells, initial evaluation was on July 13, 2019.  I have reviewed and summarized the referring note from the referring physician.  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, 2019, around 2010, she began to notice intermittent word finding difficulties, short-term memory loss, 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  She denied clinical seizure activity,  She also suffered obesity, sedentary lifestyle, was started on CPAP in 2020, which has helped her job performance Laboratory evaluation in 2020: Vitamin D 31.5, CMP showed mildly elevated creatinine 1.04, A1c 5.1, elevated triglyceride 160, normal TSH  UPDATE September 14 2019: She is accompanied by her husband at today's visit, together we reviewed EEG on August 02, 2019, post hyperventilation, photic stimulation, there was frequent sharp protrusion of generalized spike slow waves, the longest episode lasting 2 to 3 seconds, there was no clinical seizure, patient did not aware of any difference,  She was started on Topamax 100 mg twice daily following abnormal EEG, she complains of brain foggy, she was started on lamotrigine titration since August 30, 2019, tolerating it well, she noticed mild improvement in her staring spells, husband did not witness event, stated that she is absent-minded all her life, patient also reported intermittent uncontrollable body jerking movement, lasting less than 1 minute,  I personally reviewed MRI of the brain without contrast on August 20, 2019, no acute abnormality, enlarged sella turcica,  Laboratory evaluations in April 2021 showed normal A1c 4.9, vitamin D 42, lipid panel showed LDL 93, triglyceride 151, CMP showed elevated creatinine 1.12, normal CBC, RPR, TSH, B12   REVIEW OF SYSTEMS: Full 14 system review of systems performed and notable only for as above All other review of systems were negative.  ALLERGIES: Allergies  Allergen Reactions  . Flonase [Fluticasone Propionate] Other (See Comments)    Nasal irritation, burning, but can tolerate advair inhaler.    August 22, 2019  Sulfa Antibiotics Hives    HOME MEDICATIONS: Current Outpatient Medications  Medication Sig Dispense Refill  . albuterol (PROAIR HFA) 108 (90 Base) MCG/ACT inhaler Inhale 1-2 puffs into the lungs every 6 (six) hours  as needed for wheezing or shortness of breath. 8.7 g 2  . aspirin 81 MG tablet Take 81 mg by mouth daily.    [name] aspirin-acetaminophen-caffeine (EXCEDRIN MIGRAINE) 250-250-65 MG tablet Take by mouth every 6 (six) hours as needed for headache.    [name] buPROPion (WELLBUTRIN XL) 300 MG 24 hr tablet TAKE 1 TABLET BY MOUTH  DAILY 30 tablet 11  . dextromethorphan-guaiFENesin (MUCINEX DM) 30-600 MG 12hr tablet Take 1 tablet by mouth 2 (two) times daily as needed for cough.    . Fluticasone-Salmeterol (ADVAIR) 100-50 MCG/DOSE AEPB Inhale 1 puff into the lungs 2 (two) times daily. 60 each 5  . lamoTRIgine (LAMICTAL) 100 MG tablet Take 1 tablet (100 mg total) by mouth 2 (two) times daily. 60 tablet 5  . lamoTRIgine (LAMICTAL) 25 MG tablet One tab BID x 7 days, then two tabs BID x 7 days, then three tabs BID x 7, then switch to 100mg  tabs at one BID. 84 tablet 0  . losartan (COZAAR) 25 MG tablet Take 1 tablet (25 mg total) by mouth daily. 30 tablet 5  . montelukast (SINGULAIR) 10 MG tablet TAKE 1 TABLET (10 MG TOTAL) BY MOUTH AT BEDTIME. 30 tablet 5  . niacin (NIASPAN) 1000 MG CR tablet TAKE 1 TABLET BY MOUTH AT  BEDTIME 90 tablet 3  . omeprazole (PRILOSEC) 20 MG capsule Take 1 capsule (20 mg total) by mouth daily. 30 capsule 2  . sertraline (ZOLOFT) 100 MG tablet TAKE 1 TABLET BY MOUTH  DAILY 90 tablet 3  . simvastatin (ZOCOR) 20 MG tablet TAKE 1 TABLET BY MOUTH  DAILY AT 6 PM. 90 tablet 3  . topiramate (TOPAMAX) 100 MG tablet Take 100 mg by mouth at bedtime.     . traZODone (DESYREL) 50 MG tablet Take 0.5-1 tablets (25-50 mg total) by mouth at bedtime as needed for sleep. 90 tablet 0   No current facility-administered medications for this visit.    PAST MEDICAL HISTORY: Past Medical History:  Diagnosis Date  . Allergy   . Asthma   . Back pain   . Bulging lumbar disc   . Chicken pox   . Constipation   . Depression   . Fatigue   . Frequent headaches   . Gall bladder disease   . Headache   .  Heartburn   . Hyperlipidemia   . IBS (irritable bowel syndrome)   . Interstitial cystitis   . RA (rheumatoid arthritis) ([facility])   . Shortness of breath on exertion   . Staring episodes   . Trouble in sleeping   . Urine incontinence   . Vertigo   . Vision changes   . Vitamin D deficiency     PAST SURGICAL HISTORY: Past Surgical History:  Procedure Laterality Date  . CHOLECYSTECTOMY    . cyst removed from back    . NASAL SINUS SURGERY    . TUBAL LIGATION  1999    FAMILY HISTORY: Family History  Problem Relation Age of Onset  . COPD Mother   . Heart disease Mother   . Polymyalgia rheumatica Mother   . Emphysema Mother   . Stroke Mother   . Depression Mother   . Anxiety disorder Mother   . Heart disease Father   . Hypertension Father   .  Hyperlipidemia Father   . Anxiety disorder Father   . Cancer Paternal Aunt        Breast  . Breast cancer Paternal Aunt 40  . Stroke Maternal Grandmother   . Cancer Maternal Aunt   . Other Brother        melas syndrom  . Parkinson's disease Maternal Grandfather     SOCIAL HISTORY: Social History   Socioeconomic History  . Marital status: Married    Spouse name: Jesusita Oka  . Number of children: 2  . Years of education: college  . Highest education level: Bachelor's degree (e.g., BA, AB, BS)  Occupational History  . Occupation: Editor, commissioning  Tobacco Use  . Smoking status: Never Smoker  . Smokeless tobacco: Never Used  Substance and Sexual Activity  . Alcohol use: Yes    Alcohol/week: 0.0 standard drinks    Comment: rare--wine  . Drug use: No  . Sexual activity: Not on file  Other Topics Concern  . Not on file  Social History Narrative   Lives at home with family.   Right-handed.   Caffeine use: Diet Pepsi - 8 ounces to 2 liters per day - varies.   Social Determinants of Health   Financial Resource Strain:   . Difficulty of Paying Living Expenses:   Food Insecurity:   . Worried About Brewing technologist in the Last Year:   . Barista in the Last Year:   Transportation Needs:   . Freight forwarder (Medical):   [name] Lack of Transportation (Non-Medical):   Physical Activity:   . Days of Exercise per Week:   . Minutes of Exercise per Session:   Stress:   . Feeling of Stress :   Social Connections:   . Frequency of Communication with Friends and Family:   . Frequency of Social Gatherings with Friends and Family:   . Attends Religious Services:   . Active Member of Clubs or Organizations:   . Attends Banker Meetings:   [name] Marital Status:   Intimate Partner Violence:   . Fear of Current or Ex-Partner:   . Emotionally Abused:   [name] Physically Abused:   . Sexually Abused:      PHYSICAL EXAM   Vitals:   09/13/19 1630  BP: 129/82  Pulse: 79  Temp: 97.9 F (36.6 C)  Weight: 260 lb (117.9 kg)  Height: 5\' 8"  (1.727 m)    Not recorded      Body mass index is 39.53 kg/m.  PHYSICAL EXAMNIATION:  Gen: NAD, conversant, well nourised, well groomed                     Cardiovascular: Regular rate rhythm, no peripheral edema, warm, nontender. Eyes: Conjunctivae clear without exudates or hemorrhage Neck: Supple, no carotid bruits. Pulmonary: Clear to auscultation bilaterally   NEUROLOGICAL EXAM:  MMSE - Mini Mental State Exam 07/13/2019  Orientation to time 5  Orientation to Place 5  Registration 3  Attention/ Calculation 5  Recall 3  Language- name 2 objects 2  Language- repeat 1  Language- follow 3 step command 3  Language- read & follow direction 1  Write a sentence 1  Copy design 1  Total score 30     CRANIAL NERVES: CN II: Visual fields are full to confrontation. Pupils are round equal and briskly reactive to light. CN III, IV, VI: extraocular movement are normal. No ptosis. CN V: Facial sensation is intact to  light touch CN VII: Face is symmetric with normal eye closure  CN VIII: Hearing is normal to causal conversation. CN IX, X:  Phonation is normal. CN XI: Head turning and shoulder shrug are intact  MOTOR: There is no pronator drift of out-stretched arms. Muscle bulk and tone are normal. Muscle strength is normal.  REFLEXES: Reflexes are 2+ and symmetric at the biceps, triceps, knees, and ankles. Plantar responses are flexor.  SENSORY: Intact to light touch, pinprick and vibratory sensation are intact in fingers and toes.  COORDINATION: There is no trunk or limb dysmetria noted.  GAIT/STANCE: Posture is normal. Gait is steady with normal steps, base, arm swing, and turning. Heel and toe walking are normal. Tandem gait is normal.  Romberg is absent.   DIAGNOSTIC DATA (LABS, IMAGING, TESTING) - I reviewed patient records, labs, notes, testing and imaging myself where available.   ASSESSMENT AND PLAN  FLECIA SHUTTER is a 54 y.o. female   Generalized idiopathic epilepsy disorder  Reported lifelong history of frequent staring spells,  MRI of the brain showed no structural abnormality  EEG showed short lasting generalized spike slow waves, episode can last 2 to 3 seconds, no clinical seizure activity,  Mild improvement with lamotrigine, titrating to 100 mg twice a day, document all event, if she continue have recurrent spells, may titrating to higher dose, wean off Topamax, due to side effect, brain foggy sensation   Marcial Pacas, M.D. Ph.D.  [facility] [address], [place], [place] [address] Ph: [phone] Fax: [phone]  CC: Clyde Lundborg, NP

## 2019-09-14 ENCOUNTER — Encounter: Payer: Self-pay | Admitting: Neurology

## 2019-09-14 NOTE — Progress Notes (Signed)
Chief Complaint:   OBESITY Donna Vasquez is here to discuss her progress with her obesity treatment plan along with follow-up of her obesity related diagnoses. Donna Vasquez is on the Category 2 Plan and states she is following her eating plan approximately 85-90% of the time. Donna Vasquez states she is doing 0 minutes 0 times per week.  Today's visit was #: 42 Starting weight: 248 lbs Starting date: 11/17/2017 Today's weight: 257 lbs Today's date: 09/13/2019 Total lbs lost to date: 0 Total lbs lost since last in-office visit: 2  Interim History: Donna Vasquez was recently diagnosed with epilepsy and has been started on Topamax and lamictal. She was referred by Dr. Owens Shark to Neurology where she was diagnosed. She can't drive until September 2021. She feels the Topamax is helping with appetite. She has also cut out sodas due to dysgeusia from topamax.  Subjective:   1. Insulin resistance Donna Vasquez denies polyphagia. She is not on metformin. Lab Results  Component Value Date   HGBA1C 4.9 08/31/2019    Assessment/Plan:   1. Insulin resistance Donna Vasquez will continue with her meal plan, and will continue to work on weight loss, exercise, and decreasing simple carbohydrates to help decrease the risk of diabetes. Donna Vasquez agreed to follow-up with [name] as directed to closely monitor her progress.  2. Class 2 severe obesity with serious comorbidity and body mass index (BMI) of 39.0 to 39.9 in adult, unspecified obesity type Donna Vasquez) Donna Vasquez is currently in the action stage of change. As such, her goal is to continue with weight loss efforts. She has agreed to the Category 2 Plan with additional lunch and breakfast options.   Exercise goals: No exercise has been prescribed at this time.  Behavioral modification strategies: increasing lean protein intake, no skipping meals and planning for success.  Donna Vasquez has agreed to follow-up with our clinic in 4 weeks. She was informed of the importance of frequent follow-up  visits to maximize her success with intensive lifestyle modifications for her multiple health conditions.   Objective:   Blood pressure 116/71, pulse 70, temperature 98.1 F (36.7 C), temperature source Oral, height 5\' 8"  (1.727 m), weight 257 lb (116.6 kg), last menstrual period 08/19/2019, SpO2 99 %. Body mass index is 39.08 kg/m.  General: Cooperative, alert, well developed, in no acute distress. HEENT: Conjunctivae and lids unremarkable. Cardiovascular: Regular rhythm.  Lungs: Normal work of breathing. Neurologic: No focal deficits.   Lab Results  Component Value Date   CREATININE 1.12 (H) 08/31/2019   BUN 17 08/31/2019   NA 142 08/31/2019   K 4.7 08/31/2019   CL 108 (H) 08/31/2019   CO2 24 08/31/2019   Lab Results  Component Value Date   ALT 14 08/31/2019   AST 14 08/31/2019   ALKPHOS 116 08/31/2019   BILITOT 0.3 08/31/2019   Lab Results  Component Value Date   HGBA1C 4.9 08/31/2019   HGBA1C 5.1 09/09/2018   HGBA1C 5.0 04/02/2018   HGBA1C 5.1 11/17/2017   HGBA1C 5.0 11/13/2015   Lab Results  Component Value Date   INSULIN 17.3 09/09/2018   INSULIN 10.1 04/02/2018   INSULIN 8.7 11/17/2017   Lab Results  Component Value Date   TSH 1.700 07/13/2019   Lab Results  Component Value Date   CHOL 174 08/31/2019   HDL 55 08/31/2019   LDLCALC 93 08/31/2019   TRIG 151 (H) 08/31/2019   CHOLHDL 3.2 08/31/2019   Lab Results  Component Value Date   WBC 6.7 08/31/2019   HGB 13.7  08/31/2019   HCT 39.9 08/31/2019   MCV 94 08/31/2019   PLT 233 08/31/2019   No results found for: IRON, TIBC, FERRITIN  Attestation Statements:   Reviewed by clinician on day of visit: allergies, medications, problem list, medical history, surgical history, family history, social history, and previous encounter notes.   Trude Mcburney, am acting as Energy manager for [place], FNP-C.  I have reviewed the above documentation for accuracy and completeness, and I agree with  the above. -  Jesse Sans, FNP

## 2019-09-15 ENCOUNTER — Encounter (INDEPENDENT_AMBULATORY_CARE_PROVIDER_SITE_OTHER): Payer: Self-pay | Admitting: Family Medicine

## 2019-09-15 DIAGNOSIS — E6609 Other obesity due to excess calories: Secondary | ICD-10-CM | POA: Insufficient documentation

## 2019-09-15 DIAGNOSIS — E88819 Insulin resistance, unspecified: Secondary | ICD-10-CM | POA: Insufficient documentation

## 2019-09-15 DIAGNOSIS — E8881 Metabolic syndrome: Secondary | ICD-10-CM | POA: Insufficient documentation

## 2019-09-24 ENCOUNTER — Other Ambulatory Visit: Payer: Self-pay | Admitting: Neurology

## 2019-09-27 ENCOUNTER — Telehealth: Payer: Self-pay | Admitting: Neurology

## 2019-09-27 NOTE — Telephone Encounter (Signed)
I noticed some bumps on the back of my upper right arm last week but thought it was dry skin.  I now have some on the upper front of my right arm and can feel some on the upper front of my left arm the just aren't as pronounced.  I started the full dosage of the lamotrigine last week and you had said if I had a rash to stop it immediately.  Can you let me know what you think and if I need to stop the meds?   Please call patient, if is just isolated bump at different area, less suggestive of medicine related rash,  Following her some symptoms associated with lamotrigine rash, if you cannot make decision based on conversation, that her stopped tonight lamotrigine, come in  to the office tomorrow morning without appointment, for me to check on her  A Lamictal rash usually appears within 8 weeks of starting treatment. It usually goes away on its own when medication is stopped, with no other serious side effects.  Signs and symptoms of the rash typically include:  red blisters in one or more areas, often the face or mouth itching skin hives general feeling of being unwell fever Indications of a more serious rash include:  peeling skin very painful blisters redness, swelling, and inflammation in or around the eyes body aches a cough flu-like symptoms swollen lymph nodes

## 2019-09-27 NOTE — Telephone Encounter (Signed)
I called pt. She has a rash on her right arm that itches. She does have a spot of itchy skin on her left arm now as well. I advised her to stop the lamictal and to come to the office tomorrow for Dr. Terrace Arabia to take a look at the rash. Pt will let me know if she is unable to come to the office tomorrow morning.

## 2019-09-28 ENCOUNTER — Telehealth: Payer: Self-pay | Admitting: Neurology

## 2019-09-28 MED ORDER — VIMPAT 100 MG PO TABS: 100.0000 mg | ORAL_TABLET | Freq: Two times a day (BID) | ORAL | 5 refills | Status: DC

## 2019-09-28 NOTE — Telephone Encounter (Signed)
Patient developed rash involving bilateral deltoid region when she titrated up lamotrigine to 100 mg twice daily on Sep 27, 2019,, also was noted to have face acne, which is often associated with her menstruation.  The rash at bilateral deltoid region similar to the rash that she had with sulfa allergy, which involving bilateral shin area   We have advised her stop lamotrigine at evening of Sep 27, 2019, I started Vimpat 100 mg twice a day,

## 2019-10-01 ENCOUNTER — Encounter: Payer: Self-pay | Admitting: Internal Medicine

## 2019-10-05 ENCOUNTER — Telehealth: Payer: Self-pay | Admitting: Neurology

## 2019-10-05 ENCOUNTER — Other Ambulatory Visit: Payer: Self-pay | Admitting: *Deleted

## 2019-10-05 MED ORDER — VIMPAT 100 MG PO TABS: 100.0000 mg | ORAL_TABLET | Freq: Two times a day (BID) | ORAL | 5 refills | Status: DC

## 2019-10-05 NOTE — Telephone Encounter (Signed)
Gave printed/signed rx back to [place] to process for pt

## 2019-10-05 NOTE — Telephone Encounter (Signed)
Donna Vasquez working on Patient assistance for Patient . I need a hard copy RX for Vimpat please. Thanks Annabelle Harman

## 2019-10-05 NOTE — Telephone Encounter (Signed)
Rx printed, waiting on MD signature 

## 2019-10-06 NOTE — Telephone Encounter (Signed)
[facility] PAP application has been faxed. Processing Telephone [phone] fax [phone] .

## 2019-10-08 ENCOUNTER — Ambulatory Visit
Admission: RE | Admit: 2019-10-08 | Discharge: 2019-10-08 | Disposition: A | Discharge status: Home / Self Care | Payer: Managed Care, Other (non HMO) | Source: Ambulatory Visit | Attending: Internal Medicine | Admitting: Internal Medicine

## 2019-10-08 DIAGNOSIS — Z1231 Encounter for screening mammogram for malignant neoplasm of breast: Secondary | ICD-10-CM | POA: Insufficient documentation

## 2019-10-08 DIAGNOSIS — Z0000 Encounter for general adult medical examination without abnormal findings: Secondary | ICD-10-CM

## 2019-10-11 ENCOUNTER — Ambulatory Visit (INDEPENDENT_AMBULATORY_CARE_PROVIDER_SITE_OTHER): Payer: Managed Care, Other (non HMO) | Admitting: Family Medicine

## 2019-10-14 ENCOUNTER — Other Ambulatory Visit: Payer: Self-pay

## 2019-10-14 ENCOUNTER — Ambulatory Visit: Payer: Managed Care, Other (non HMO) | Admitting: Neurology

## 2019-10-14 DIAGNOSIS — G40309 Generalized idiopathic epilepsy and epileptic syndromes, not intractable, without status epilepticus: Secondary | ICD-10-CM | POA: Diagnosis not present

## 2019-10-21 NOTE — Procedures (Signed)
   HISTORY: 54 year old female presented with staring spells, previous EEG showed generalized frequent spike slow waves, she was started on lamotrigine 100 mg twice a day, reported significant clinical improvement. TECHNIQUE:  This is a routine 16 channel EEG recording with one channel devoted to a limited EKG recording.  It was performed during wakefulness, drowsiness and asleep.  Hyperventilation and photic stimulation were performed as activating procedures.  There are minimum muscle and movement artifact noted.  Upon maximum arousal, posterior dominant waking rhythm consistent of rhythmic alpha range activity, with frequency of 9 hz. Activities are symmetric over the bilateral posterior derivations and attenuated with eye opening.  Hyperventilation produced mild/moderate buildup with higher amplitude and the slower activities noted.  Photic stimulation did not alter the tracing.  Post photic stimulation, when patient was drowsy, there was occasionally short lasting 1 to 2-second spike slow waves, this has much improved compared to previous EEG study on August 02, 2019, when she had more frequent longer lasting spike slow waves  During EEG recording, patient developed drowsiness and no deeper stage of sleep was achieved   EKG demonstrate sinus rhythm, with heart rate of 76 bpm  CONCLUSION: This is an abnormal EEG.  There is occasionally appearance of generalized spike slow wave activity, lasting 1 to 2 seconds, this has much improved compared to previous study in March 2021 after treatment with lamotrigine.  Above findings support a diagnosis generalized epilepsy disorder  Levert Feinstein, M.D. Ph.D.  [facility] [address] [place], [place] [address] Phone: [phone] Fax:      [phone]

## 2019-10-25 ENCOUNTER — Telehealth: Payer: Self-pay | Admitting: Neurology

## 2019-10-25 NOTE — Telephone Encounter (Signed)
I spoke to the patient who confirmed she is taking Vimpat 100mg , one tablet BID. Reports her focus has improved, no recent issues with staring spells and her production has increased at work. She is feeling much better.

## 2019-10-25 NOTE — Telephone Encounter (Signed)
CONCLUSION:  This is an abnormal EEG. There is occasionally appearance of  generalized spike slow wave activity, lasting 1 to 2 seconds,  this has much improved compared to previous study in March 2021  after treatment with lamotrigine. Above findings support a  diagnosis generalized epilepsy disorder   Please call patient, repeat EEG showed significant improvement,  Also double check her current medication regime, can she tolerate current medications, how about about her spells

## 2019-11-09 ENCOUNTER — Telehealth (INDEPENDENT_AMBULATORY_CARE_PROVIDER_SITE_OTHER): Payer: Managed Care, Other (non HMO) | Admitting: Internal Medicine

## 2019-11-09 ENCOUNTER — Encounter: Payer: Self-pay | Admitting: Internal Medicine

## 2019-11-09 DIAGNOSIS — J329 Chronic sinusitis, unspecified: Secondary | ICD-10-CM

## 2019-11-09 DIAGNOSIS — B9789 Other viral agents as the cause of diseases classified elsewhere: Secondary | ICD-10-CM

## 2019-11-09 MED ORDER — PREDNISONE 10 MG PO TABS: ORAL_TABLET | ORAL | 0 refills | Status: DC

## 2019-11-09 NOTE — Patient Instructions (Signed)

## 2019-11-09 NOTE — Progress Notes (Signed)
Virtual Visit via Video Note  I connected with Donna Vasquez on 11/09/19 at  9:15 AM EDT by a video enabled telemedicine application and verified that I am speaking with the correct person using two identifiers.  Location: Patient: Home Provider: Office   I discussed the limitations of evaluation and management by telemedicine and the availability of in person appointments. The patient expressed understanding and agreed to proceed.  HPI  Pt reports headache, facial pain and pressure, runny nose, ear fullness and pain in her teeth. She reports this started a week or so ago but worsened yesterday. The headache is located in her forehead. She describes the pain as pressure. She has some pressure behind her eyes but denies visual changes or dizziness. She is blowing clear mucous out of her nose. She denies ear pain, drainage or loss of hearing. She denies nasal congestion, loss of smell/taste, cough or SOB. She denies fever or chills but has had some fatigue, muscle aches and hot flashes. She has has sick contacts with a head cold but denies exposure to Covid. She has taken Singulair with minimal relief.  Review of Systems     Past Medical History:  Diagnosis Date  . Allergy   . Asthma   . Back pain   . Bulging lumbar disc   . Chicken pox   . Constipation   . Depression   . Fatigue   . Frequent headaches   . Gall bladder disease   . Headache   . Heartburn   . Hyperlipidemia   . IBS (irritable bowel syndrome)   . Interstitial cystitis   . RA (rheumatoid arthritis) ([place])   . Shortness of breath on exertion   . Staring episodes   . Trouble in sleeping   . Urine incontinence   . Vertigo   . Vision changes   . Vitamin D deficiency     Family History  Problem Relation Age of Onset  . COPD Mother   . Heart disease Mother   . Polymyalgia rheumatica Mother   . Emphysema Mother   . Stroke Mother   . Depression Mother   . Anxiety disorder Mother   . Heart disease Father   .  Hypertension Father   . Hyperlipidemia Father   . Anxiety disorder Father   . Cancer Paternal Aunt        Breast  . Breast cancer Paternal Aunt 71  . Stroke Maternal Grandmother   . Cancer Maternal Aunt   . Other Brother        melas syndrom  . Parkinson's disease Maternal Grandfather     Social History   Socioeconomic History  . Marital status: Married    Spouse name: Linna Hoff  . Number of children: 2  . Years of education: college  . Highest education level: Bachelor's degree (e.g., BA, AB, BS)  Occupational History  . Occupation: Patent attorney  Tobacco Use  . Smoking status: Never Smoker  . Smokeless tobacco: Never Used  Substance and Sexual Activity  . Alcohol use: Yes    Alcohol/week: 0.0 standard drinks    Comment: rare--wine  . Drug use: No  . Sexual activity: Not on file  Other Topics Concern  . Not on file  Social History Narrative   Lives at home with family.   Right-handed.   Caffeine use: Diet Pepsi - 8 ounces to 2 liters per day - varies.   Social Determinants of Health   Financial Resource Strain:   .  Difficulty of Paying Living Expenses:   Food Insecurity:   . Worried About Programme researcher, broadcasting/film/video in the Last Year:   . Barista in the Last Year:   Transportation Needs:   . Freight forwarder (Medical):   [name] Lack of Transportation (Non-Medical):   Physical Activity:   . Days of Exercise per Week:   . Minutes of Exercise per Session:   Stress:   . Feeling of Stress :   Social Connections:   . Frequency of Communication with Friends and Family:   . Frequency of Social Gatherings with Friends and Family:   . Attends Religious Services:   . Active Member of Clubs or Organizations:   . Attends Banker Meetings:   [name] Marital Status:   Intimate Partner Violence:   . Fear of Current or Ex-Partner:   . Emotionally Abused:   [name] Physically Abused:   . Sexually Abused:     Allergies  Allergen Reactions  . Flonase  [Fluticasone Propionate] Other (See Comments)    Nasal irritation, burning, but can tolerate advair inhaler.    . Sulfa Antibiotics Hives     Constitutional: Positive headache, fatigue. Denies fever, abrupt weight changes.  HEENT:  Positive eye pain, facial pain, runny nose and ear fullness. Denies eye redness, ear pain, ringing in the ears, wax buildup, nasal congestion or sore throat. Respiratory: Denies cough, difficulty breathing or shortness of breath.  Cardiovascular: Denies chest pain, chest tightness, palpitations or swelling in the hands or feet.   No other specific complaints in a complete review of systems (except as listed in HPI above).  Objective:    General: Appears her stated age, in NAD. HEENT: Head: normal shape and size, points to ethmoidal and right maxillary sinus tenderness; Nose: slightly scheduleded; Throat/Mouth: No hoarseness noted. Pulmonary/Chest: Normal effort. No respiratory distress.       Assessment & Plan:   Viral Sinusitis  Can use a Neti Pot which can be purchased from your local drug store. RX for Pred Taper x 6 days Continue Singulair Consider Augmentin if symptoms persist or worsen by the end of this week/beginning of next week  RTC as needed or if symptoms persist. Nicki Reaper, NP     I discussed the assessment and treatment plan with the patient. The patient was provided an opportunity to ask questions and all were answered. The patient agreed with the plan and demonstrated an understanding of the instructions.   The patient was advised to call back or seek an in-person evaluation if the symptoms worsen or if the condition fails to improve as anticipated.    Nicki Reaper, NP

## 2019-11-11 ENCOUNTER — Other Ambulatory Visit: Payer: Self-pay

## 2019-11-11 ENCOUNTER — Encounter: Payer: Self-pay | Admitting: Neurology

## 2019-11-11 ENCOUNTER — Ambulatory Visit: Payer: Managed Care, Other (non HMO) | Admitting: Neurology

## 2019-11-11 VITALS — BP 135/84 | HR 70 | Ht 67.0 in | Wt 261.0 lb

## 2019-11-11 DIAGNOSIS — G40309 Generalized idiopathic epilepsy and epileptic syndromes, not intractable, without status epilepticus: Secondary | ICD-10-CM

## 2019-11-11 MED ORDER — VIMPAT 150 MG PO TABS: 150.0000 mg | ORAL_TABLET | Freq: Two times a day (BID) | ORAL | 5 refills | Status: DC

## 2019-11-11 NOTE — Progress Notes (Signed)
PATIENT: Donna Vasquez DOB: 09/03/1965  REASON FOR VISIT: follow up HISTORY FROM: patient  HISTORY OF PRESENT ILLNESS: Today 11/11/19  HISTORY  Donna Vasquez is a 54 year old female, seen in request by bariatrics physician Dr. Roswell Nickel, and her primary care nurse practitioner Lorre Munroe for evaluation of frequent staring spells, initial evaluation was on July 13, 2019.  I have reviewed and summarized the referring note from the referring physician.  She has past medical history of hyperlipidemia, hypertension, depression anxiety taking Wellbutrin XL 300 mg daily, trazodone 50 mg daily, Zoloft 100 mg daily, she had been on this combination for more than 1 year, remains depressed, especially since COVID-19, she has to work from home, reported 40 pound weight gain over past 1 year  She has strong family history of dementia, her mother, 2 maternal uncles suffered Alzheimer's disease, her father also was diagnosed with vascular dementia in his late 93s  She used to work as a Architectural technologist, around 2010, she began to notice intermittent word finding difficulties, short-term memory loss, while she was disciplining her student, she forgot why she was disciplining them in the middle of the conversation  She started a new job at [employer] 2018, went through weakness computer program training, she describes she felt overwhelmed at the beginning, but now she has been doing the same job for 3 years, she continue has low productivity, 50 to 80% as expected, she often got lost with quick switch of the program, computer screen, oftentimes she fell difficulty focusing, few minutes elapsed without her realizing it.  She denied clinical seizure activity,  She also suffered obesity, sedentary lifestyle, was started on CPAP in 2020, which has helped her job performance Laboratory evaluation in 2020: Vitamin D 31.5, CMP showed mildly elevated creatinine 1.04, A1c 5.1, elevated  triglyceride 160, normal TSH  UPDATE September 14 2019: She is accompanied by her husband at today's visit, together we reviewed EEG on August 02, 2019, post hyperventilation, photic stimulation, there was frequent sharp protrusion of generalized spike slow waves, the longest episode lasting 2 to 3 seconds, there was no clinical seizure, patient did not aware of any difference,  She was started on Topamax 100 mg twice daily following abnormal EEG, she complains of brain foggy, she was started on lamotrigine titration since August 30, 2019, tolerating it well, she noticed mild improvement in her staring spells, husband did not witness event, stated that she is absent-minded all her life, patient also reported intermittent uncontrollable body jerking movement, lasting less than 1 minute,  I personally reviewed MRI of the brain without contrast on August 20, 2019, no acute abnormality, enlarged sella turcica,  Laboratory evaluations in April 2021 showed normal A1c 4.9, vitamin D 42, lipid panel showed LDL 93, triglyceride 151, CMP showed elevated creatinine 1.12, normal CBC, RPR, TSH, B12  Update November 11, 2019 SS: Since last seen,  Lamictal was stopped due to rash involving bilateral deltoid area, started Vimpat 100 mg twice a day.  EEG in May 2021 was abnormal, occasional appearance of generalized spike slow wave activity, lasting 1 to 2 seconds, this is much improved compared to previous study in March 2021 after treatment with lamotrigine, this suggested diagnosis of generalized epilepsy disorder.  Staring episodes have improved, still having late in the day, after busy day, hasn't been sleeping well, she is now aware of episodes, before was unaware.  Has had increased stress, both her parents have memory trouble, are hospitalized.  With Vimpat, her focus has improved with work. Still complains of memory troubles, trouble with short-term memory.  REVIEW OF SYSTEMS: Out of a complete 14 system review of  symptoms, the patient complains only of the following symptoms, and all other reviewed systems are negative.  Seizures   ALLERGIES: Allergies  Allergen Reactions  . Flonase [Fluticasone Propionate] Other (See Comments)    Nasal irritation, burning, but can tolerate advair inhaler.    Gaetana Michaelis Antibiotics Hives    HOME MEDICATIONS: Outpatient Medications Prior to Visit  Medication Sig Dispense Refill  . albuterol (PROAIR HFA) 108 (90 Base) MCG/ACT inhaler Inhale 1-2 puffs into the lungs every 6 (six) hours as needed for wheezing or shortness of breath. 8.7 g 2  . aspirin 81 MG tablet Take 81 mg by mouth daily.    [name] aspirin-acetaminophen-caffeine (EXCEDRIN MIGRAINE) 250-250-65 MG tablet Take by mouth every 6 (six) hours as needed for headache.    [name] buPROPion (WELLBUTRIN XL) 300 MG 24 hr tablet TAKE 1 TABLET BY MOUTH  DAILY 30 tablet 11  . dextromethorphan-guaiFENesin (MUCINEX DM) 30-600 MG 12hr tablet Take 1 tablet by mouth 2 (two) times daily as needed for cough.     . Fluticasone-Salmeterol (ADVAIR) 100-50 MCG/DOSE AEPB Inhale 1 puff into the lungs 2 (two) times daily. 60 each 5  . Lacosamide (VIMPAT) 100 MG TABS Take 1 tablet (100 mg total) by mouth in the morning and at bedtime. 60 tablet 5  . losartan (COZAAR) 25 MG tablet Take 1 tablet (25 mg total) by mouth daily. 30 tablet 5  . montelukast (SINGULAIR) 10 MG tablet TAKE 1 TABLET (10 MG TOTAL) BY MOUTH AT BEDTIME. 30 tablet 5  . niacin (NIASPAN) 1000 MG CR tablet TAKE 1 TABLET BY MOUTH AT  BEDTIME 90 tablet 3  . omeprazole (PRILOSEC) 20 MG capsule Take 1 capsule (20 mg total) by mouth daily. 30 capsule 2  . predniSONE (DELTASONE) 10 MG tablet Take 6 tabs day 1, 5 tabs day 2, 4 tabs day 3, 3 tabs day 4, 2 tabs day 5, 1 tab day 6 21 tablet 0  . sertraline (ZOLOFT) 100 MG tablet TAKE 1 TABLET BY MOUTH  DAILY 90 tablet 3  . simvastatin (ZOCOR) 20 MG tablet TAKE 1 TABLET BY MOUTH  DAILY AT 6 PM. 90 tablet 3  . traZODone (DESYREL) 50 MG  tablet Take 0.5-1 tablets (25-50 mg total) by mouth at bedtime as needed for sleep. 90 tablet 0   No facility-administered medications prior to visit.    PAST MEDICAL HISTORY: Past Medical History:  Diagnosis Date  . Allergy   . Asthma   . Back pain   . Bulging lumbar disc   . Chicken pox   . Constipation   . Depression   . Fatigue   . Frequent headaches   . Gall bladder disease   . Headache   . Heartburn   . Hyperlipidemia   . IBS (irritable bowel syndrome)   . Interstitial cystitis   . RA (rheumatoid arthritis) ([facility])   . Shortness of breath on exertion   . Staring episodes   . Trouble in sleeping   . Urine incontinence   . Vertigo   . Vision changes   . Vitamin D deficiency     PAST SURGICAL HISTORY: Past Surgical History:  Procedure Laterality Date  . CHOLECYSTECTOMY    . cyst removed from back    . NASAL SINUS SURGERY    . TUBAL LIGATION  1999    FAMILY HISTORY: Family History  Problem Relation Age of Onset  . COPD Mother   . Heart disease Mother   . Polymyalgia rheumatica Mother   . Emphysema Mother   . Stroke Mother   . Depression Mother   . Anxiety disorder Mother   . Heart disease Father   . Hypertension Father   . Hyperlipidemia Father   . Anxiety disorder Father   . Cancer Paternal Aunt        Breast  . Breast cancer Paternal Aunt 8  . Stroke Maternal Grandmother   . Cancer Maternal Aunt   . Other Brother        melas syndrom  . Parkinson's disease Maternal Grandfather     SOCIAL HISTORY: Social History   Socioeconomic History  . Marital status: Married    Spouse name: Jesusita Oka  . Number of children: 2  . Years of education: college  . Highest education level: Bachelor's degree (e.g., BA, AB, BS)  Occupational History  . Occupation: Editor, commissioning  Tobacco Use  . Smoking status: Never Smoker  . Smokeless tobacco: Never Used  Substance and Sexual Activity  . Alcohol use: Yes    Alcohol/week: 0.0 standard drinks     Comment: rare--wine  . Drug use: No  . Sexual activity: Not on file  Other Topics Concern  . Not on file  Social History Narrative   Lives at home with family.   Right-handed.   Caffeine use: Diet Pepsi - 8 ounces to 2 liters per day - varies.   Social Determinants of Health   Financial Resource Strain:   . Difficulty of Paying Living Expenses:   Food Insecurity:   . Worried About Programme researcher, broadcasting/film/video in the Last Year:   . Barista in the Last Year:   Transportation Needs:   . Freight forwarder (Medical):   [name] Lack of Transportation (Non-Medical):   Physical Activity:   . Days of Exercise per Week:   . Minutes of Exercise per Session:   Stress:   . Feeling of Stress :   Social Connections:   . Frequency of Communication with Friends and Family:   . Frequency of Social Gatherings with Friends and Family:   . Attends Religious Services:   . Active Member of Clubs or Organizations:   . Attends Banker Meetings:   [name] Marital Status:   Intimate Partner Violence:   . Fear of Current or Ex-Partner:   . Emotionally Abused:   [name] Physically Abused:   . Sexually Abused:    PHYSICAL EXAM  Vitals:   11/11/19 0943  BP: 135/84  Pulse: 70  Weight: 261 lb (118.4 kg)  Height: 5\' 7"  (1.702 m)   Body mass index is 40.88 kg/m.  Generalized: Well developed, in no acute distress   Neurological examination  Mentation: Alert oriented to time, place, history taking. Follows all commands speech and language fluent Cranial nerve II-XII: Pupils were equal round reactive to light. Extraocular movements were full, visual field were full on confrontational test. Facial sensation and strength were normal. Head turning and shoulder shrug  were normal and symmetric. Motor: The motor testing reveals 5 over 5 strength of all 4 extremities. Good symmetric motor tone is noted throughout.  Sensory: Sensory testing is intact to soft touch on all 4 extremities. No evidence of  extinction is noted.  Coordination: Cerebellar testing reveals good finger-nose-finger and heel-to-shin bilaterally.  Gait and  station: Gait is normal.  Reflexes: Deep tendon reflexes are symmetric and normal bilaterally.   DIAGNOSTIC DATA (LABS, IMAGING, TESTING) - I reviewed patient records, labs, notes, testing and imaging myself where available.  Lab Results  Component Value Date   WBC 6.7 08/31/2019   HGB 13.7 08/31/2019   HCT 39.9 08/31/2019   MCV 94 08/31/2019   PLT 233 08/31/2019      Component Value Date/Time   NA 142 08/31/2019 1005   K 4.7 08/31/2019 1005   CL 108 (H) 08/31/2019 1005   CO2 24 08/31/2019 1005   GLUCOSE 100 (H) 08/31/2019 1005   GLUCOSE 93 11/13/2015 1501   BUN 17 08/31/2019 1005   CREATININE 1.12 (H) 08/31/2019 1005   CALCIUM 9.1 08/31/2019 1005   PROT 6.6 08/31/2019 1005   ALBUMIN 4.3 08/31/2019 1005   AST 14 08/31/2019 1005   ALT 14 08/31/2019 1005   ALKPHOS 116 08/31/2019 1005   BILITOT 0.3 08/31/2019 1005   GFRNONAA 56 (L) 08/31/2019 1005   GFRAA 64 08/31/2019 1005   Lab Results  Component Value Date   CHOL 174 08/31/2019   HDL 55 08/31/2019   LDLCALC 93 08/31/2019   TRIG 151 (H) 08/31/2019   CHOLHDL 3.2 08/31/2019   Lab Results  Component Value Date   HGBA1C 4.9 08/31/2019   Lab Results  Component Value Date   VITAMINB12 375 07/13/2019   Lab Results  Component Value Date   TSH 1.700 07/13/2019    ASSESSMENT AND PLAN 54 y.o. year old female  has a past medical history of Allergy, Asthma, Back pain, Bulging lumbar disc, Chicken pox, Constipation, Depression, Fatigue, Frequent headaches, Gall bladder disease, Headache, Heartburn, Hyperlipidemia, IBS (irritable bowel syndrome), Interstitial cystitis, RA (rheumatoid arthritis) ([place]), Shortness of breath on exertion, Staring episodes, Trouble in sleeping, Urine incontinence, Vertigo, Vision changes, and Vitamin D deficiency. here with:  1.  Generalized idiopathic epilepsy  disorder -Lifelong history of frequent staring spells -MRI of the brain showed no structural abnormality -EEG showed short lasting generalized spike slow waves, episodes can last 2 to 3 seconds, no clinical seizure activity, repeat in June 2021, EEG shows significant improvement after treatment with lamotrigine, generalized spike slow wave activity, lasting 1 to 2 seconds, suggested diagnosis of generalized epilepsy disorder -Developed rash with lamotrigine, was transitioned to Vimpat 100 mg twice a day -Spells improved, but have continued staring spells usually in the evenings when fatigued, especially lately with increased stress -Will try higher dose of Vimpat 150 mg twice a day -Will repeat EEG before next appointment in 3 months  -Has restricted driving, will continue this restriction, as we monitor patient condition -Patient is on chronic Wellbutrin for several years, we did discuss mentioning to PCP, with diagnosis of generalized epilepsy disorder, consider if other antidepressant would be an option -Will follow memory concerns overtime, MMSE was 30/30 in Feb 2021, no laboratory etiology found -Follow-up in 3 months or sooner if needed  I spent 30 minutes of face-to-face and non-face-to-face time with patient.  This included previsit chart review, lab review, study review, order entry, electronic health record documentation, patient education.  Butler Denmark, AGNP-C, DNP 11/11/2019, 12:22 PM [facility] [address] [place], [place] [address] [phone]

## 2019-11-11 NOTE — Patient Instructions (Addendum)
Increase Vimpat 150 mg twice daily  Keep track of episodes  See you back in 3 months

## 2019-12-02 ENCOUNTER — Other Ambulatory Visit: Payer: Self-pay | Admitting: *Deleted

## 2019-12-02 ENCOUNTER — Telehealth: Payer: Self-pay | Admitting: Neurology

## 2019-12-02 MED ORDER — VIMPAT 150 MG PO TABS: 150.0000 mg | ORAL_TABLET | Freq: Two times a day (BID) | ORAL | 11 refills | Status: DC

## 2019-12-02 MED ORDER — VIMPAT 150 MG PO TABS: 150.0000 mg | ORAL_TABLET | Freq: Two times a day (BID) | ORAL | 5 refills | Status: DC

## 2019-12-02 NOTE — Telephone Encounter (Signed)
Sent to [employer] to start assistance for Vimpat  [facility] Telephone [phone] -fax [phone] approval will take 7-10 days . Patient aware .

## 2019-12-02 NOTE — Progress Notes (Signed)
I can sign for vimpat 150 mg BID, 5 refills for PAP.

## 2019-12-02 NOTE — Addendum Note (Signed)
Addended by: Guy Begin on: 12/02/2019 09:40 AM   Modules accepted: Orders

## 2019-12-09 ENCOUNTER — Other Ambulatory Visit: Payer: Self-pay | Admitting: *Deleted

## 2019-12-09 MED ORDER — VIMPAT 150 MG PO TABS: 150.0000 mg | ORAL_TABLET | Freq: Two times a day (BID) | ORAL | 1 refills | Status: DC

## 2019-12-10 ENCOUNTER — Telehealth: Payer: Self-pay | Admitting: Neurology

## 2019-12-10 NOTE — Telephone Encounter (Signed)
Joni Reining from  [facility] Patient assistance program Telephone [phone] -fax [phone] called stating that the pt's Vimpat prescription is needing a MD signature. Please advise.

## 2019-12-13 NOTE — Telephone Encounter (Signed)
Refaxed to [facility] pt services.  [phone] with fax confirmation. [phone].

## 2019-12-21 ENCOUNTER — Other Ambulatory Visit (INDEPENDENT_AMBULATORY_CARE_PROVIDER_SITE_OTHER): Payer: Self-pay | Admitting: Bariatrics

## 2019-12-21 DIAGNOSIS — F339 Major depressive disorder, recurrent, unspecified: Secondary | ICD-10-CM

## 2019-12-29 ENCOUNTER — Telehealth: Payer: Self-pay | Admitting: Neurology

## 2019-12-29 ENCOUNTER — Other Ambulatory Visit: Payer: Self-pay | Admitting: Internal Medicine

## 2019-12-29 NOTE — Telephone Encounter (Signed)
Patient Denied Vimpat through patient assistance program because she has Nurse, learning disability .  See Patient a message via  My- chart .

## 2020-01-11 ENCOUNTER — Encounter: Payer: Self-pay | Admitting: Neurology

## 2020-01-11 ENCOUNTER — Ambulatory Visit: Payer: Managed Care, Other (non HMO) | Admitting: Neurology

## 2020-01-11 VITALS — BP 153/75 | HR 73 | Ht 67.0 in | Wt 259.0 lb

## 2020-01-11 DIAGNOSIS — G40309 Generalized idiopathic epilepsy and epileptic syndromes, not intractable, without status epilepticus: Secondary | ICD-10-CM | POA: Diagnosis not present

## 2020-01-11 NOTE — Progress Notes (Signed)
PATIENT: Donna Vasquez DOB: 25-Mar-1966  REASON FOR VISIT: follow up HISTORY FROM: patient  HISTORY OF PRESENT ILLNESS: Today 01/11/20  HISTORY  Donna Vasquez a 54 year old female, seen in request by bariatrics physician Dr. Roswell Nickel, and her primary care nurse practitioner Lorre Munroe for evaluation of frequent staring spells, initial evaluation was on July 13, 2019.  I have reviewed and summarized the referring note from the referring physician. She has past medical history of hyperlipidemia, hypertension, depression anxiety taking Wellbutrin XL 300 mg daily, trazodone 50 mg daily, Zoloft 100 mg daily, she had been on this combination for more than 1 year, remains depressed, especially since COVID-19, she has to work from home, reported 40 pound weight gain over past 1 year  She has strong family history of dementia, her mother, 2 maternal uncles suffered Alzheimer's disease, her father also was diagnosed with vascular dementia in his late 85s  She used to work as a Architectural technologist, around 2010, she began to notice intermittent word finding difficulties, short-term memory loss, while she was disciplining her student, she forgot why she was disciplining them in the middle of the conversation  She started a new job at [employer] 2018, went through weakness computer program training, she describes she felt overwhelmed at the beginning, but now she has been doing the same job for 3 years, she continue has low productivity, 50 to 80% as expected, she often got lost with quick switch of the program, computer screen, oftentimes she fell difficulty focusing, few minutes elapsed without her realizing it. She denied clinical seizure activity,  She also suffered obesity, sedentary lifestyle, was started on CPAP in 2020, which has helped her job performance Laboratory evaluation in 2020: Vitamin D 31.5, CMP showed mildly elevated creatinine 1.04, A1c 5.1, elevated  triglyceride 160, normal TSH  UPDATE September 14 2019: She is accompanied by her husband at today's visit, together we reviewed EEG on August 02, 2019, post hyperventilation, photic stimulation, there was frequent sharp protrusion of generalized spike slow waves, the longest episode lasting 2 to 3 seconds, there was no clinical seizure, patient did not aware of any difference,  She was started on Topamax 100 mg twice daily following abnormal EEG, she complains of brain foggy, she was started on lamotrigine titration since August 30, 2019, tolerating it well, she noticed mild improvement in her staring spells, husband did not witness event, stated that she is absent-minded all her life, patient also reported intermittent uncontrollable body jerking movement, lasting less than 1 minute,  I personally reviewed MRI of the brain without contrast on August 20, 2019, no acute abnormality, enlarged sella turcica,  Laboratory evaluations in April 2021 showed normal A1c 4.9, vitamin D 42, lipid panel showed LDL 93, triglyceride 151, CMP showed elevated creatinine 1.12, normal CBC, RPR, TSH, B12  Update November 11, 2019 SS: Since last seen,  Lamictal was stopped due to rash involving bilateral deltoid area, started Vimpat 100 mg twice a day.  EEG in May 2021 was abnormal, occasional appearance of generalized spike slow wave activity, lasting 1 to 2 seconds, this is much improved compared to previous study in March 2021 after treatment with lamotrigine, this suggested diagnosis of generalized epilepsy disorder.  Staring episodes have improved, still having late in the day, after busy day, hasn't been sleeping well, she is now aware of episodes, before was unaware.  Has had increased stress, both her parents have memory trouble, are hospitalized.  With Vimpat, her  focus has improved with work. Still complains of memory troubles, trouble with short-term memory.   Update January 11, 2020 SS: Remains on Vimpat 150 mg BID,  no more spells of staring without awareness. Those were last in May, would notice lapse in time, especially with her work, works for [employer]  [employer], didn't qualify for patient assistance.  Continues to struggle with depression, anxiety, remains on Wellbutrin, trazodone, and Zoloft.   REVIEW OF SYSTEMS: Out of a complete 14 system review of symptoms, the patient complains only of the following symptoms, and all other reviewed systems are negative.  seizures  ALLERGIES: Allergies  Allergen Reactions   Flonase [Fluticasone Propionate] Other (See Comments)    Nasal irritation, burning, but can tolerate advair inhaler.     Sulfa Antibiotics Hives    HOME MEDICATIONS: Outpatient Medications Prior to Visit  Medication Sig Dispense Refill   albuterol (PROAIR HFA) 108 (90 Base) MCG/ACT inhaler Inhale 1-2 puffs into the lungs every 6 (six) hours as needed for wheezing or shortness of breath. 8.7 g 2   aspirin 81 MG tablet Take 81 mg by mouth daily.     aspirin-acetaminophen-caffeine (EXCEDRIN MIGRAINE) 250-250-65 MG tablet Take by mouth every 6 (six) hours as needed for headache.     buPROPion (WELLBUTRIN XL) 300 MG 24 hr tablet TAKE 1 TABLET BY MOUTH  DAILY 30 tablet 11   dextromethorphan-guaiFENesin (MUCINEX DM) 30-600 MG 12hr tablet Take 1 tablet by mouth 2 (two) times daily as needed for cough.      Fluticasone-Salmeterol (ADVAIR) 100-50 MCG/DOSE AEPB Inhale 1 puff into the lungs 2 (two) times daily. 60 each 5   Lacosamide (VIMPAT) 150 MG TABS Take 1 tablet (150 mg total) by mouth in the morning and at bedtime. 180 tablet 1   losartan (COZAAR) 25 MG tablet Take 1 tablet (25 mg total) by mouth daily. 30 tablet 5   montelukast (SINGULAIR) 10 MG tablet TAKE 1 TABLET (10 MG TOTAL) BY MOUTH AT BEDTIME. 30 tablet 5   niacin (NIASPAN) 1000 MG CR tablet TAKE 1 TABLET BY MOUTH AT  BEDTIME 90 tablet 3   omeprazole (PRILOSEC) 20 MG capsule Take 1 capsule (20 mg total) by mouth  daily. 30 capsule 2   predniSONE (DELTASONE) 10 MG tablet Take 6 tabs day 1, 5 tabs day 2, 4 tabs day 3, 3 tabs day 4, 2 tabs day 5, 1 tab day 6 21 tablet 0   sertraline (ZOLOFT) 100 MG tablet TAKE 1 TABLET BY MOUTH  DAILY 90 tablet 3   simvastatin (ZOCOR) 20 MG tablet TAKE 1 TABLET BY MOUTH  DAILY AT 6 PM. 90 tablet 3   traZODone (DESYREL) 50 MG tablet TAKE 1/2 TO 1 TABLET BY  MOUTH AT BEDTIME AS NEEDED  FOR SLEEP 90 tablet 1   No facility-administered medications prior to visit.    PAST MEDICAL HISTORY: Past Medical History:  Diagnosis Date   Allergy    Asthma    Back pain    Bulging lumbar disc    Chicken pox    Constipation    Depression    Fatigue    Frequent headaches    Gall bladder disease    Headache    Heartburn    Hyperlipidemia    IBS (irritable bowel syndrome)    Interstitial cystitis    RA (rheumatoid arthritis) (HCC)    Shortness of breath on exertion    Staring episodes    Trouble in sleeping  Urine incontinence    Vertigo    Vision changes    Vitamin D deficiency     PAST SURGICAL HISTORY: Past Surgical History:  Procedure Laterality Date   CHOLECYSTECTOMY     cyst removed from back     NASAL SINUS SURGERY     TUBAL LIGATION  1999    FAMILY HISTORY: Family History  Problem Relation Age of Onset   COPD Mother    Heart disease Mother    Polymyalgia rheumatica Mother    Emphysema Mother    Stroke Mother    Depression Mother    Anxiety disorder Mother    Heart disease Father    Hypertension Father    Hyperlipidemia Father    Anxiety disorder Father    Cancer Paternal Aunt        Breast   Breast cancer Paternal Aunt 40   Stroke Maternal Grandmother    Cancer Maternal Aunt    Other Brother        melas syndrom   Parkinson's disease Maternal Grandfather     SOCIAL HISTORY: Social History   Socioeconomic History   Marital status: Married    Spouse name: Jesusita Oka   Number of children: 2    Years of education: college   Highest education level: Bachelor's degree (e.g., BA, AB, BS)  Occupational History   Occupation: Editor, commissioning  Tobacco Use   Smoking status: Never Smoker   Smokeless tobacco: Never Used  Substance and Sexual Activity   Alcohol use: Yes    Alcohol/week: 0.0 standard drinks    Comment: rare--wine   Drug use: No   Sexual activity: Not on file  Other Topics Concern   Not on file  Social History Narrative   Lives at home with family.   Right-handed.   Caffeine use: Diet Pepsi - 8 ounces to 2 liters per day - varies.   Social Determinants of Health   Financial Resource Strain:    Difficulty of Paying Living Expenses: Not on file  Food Insecurity:    Worried About Programme researcher, broadcasting/film/video in the Last Year: Not on file   [employer] of Food in the Last Year: Not on file  Transportation Needs:    Lack of Transportation (Medical): Not on file   Lack of Transportation (Non-Medical): Not on file  Physical Activity:    Days of Exercise per Week: Not on file   Minutes of Exercise per Session: Not on file  Stress:    Feeling of Stress : Not on file  Social Connections:    Frequency of Communication with Friends and Family: Not on file   Frequency of Social Gatherings with Friends and Family: Not on file   Attends Religious Services: Not on file   Active Member of Clubs or Organizations: Not on file   Attends Banker Meetings: Not on file   Marital Status: Not on file  Intimate Partner Violence:    Fear of Current or Ex-Partner: Not on file   Emotionally Abused: Not on file   Physically Abused: Not on file   Sexually Abused: Not on file   PHYSICAL EXAM  Vitals:   01/11/20 1444  BP: (!) 153/75  Pulse: 73  Weight: 259 lb (117.5 kg)  Height: 5\' 7"  (1.702 m)   Body mass index is 40.57 kg/m.  Generalized: Well developed, in no acute distress   Neurological examination  Mentation: Alert  oriented to time, place, history taking. Follows all commands  speech and language fluent Cranial nerve II-XII: Pupils were equal round reactive to light. Extraocular movements were full, visual field were full on confrontational test. Facial sensation and strength were normal.  Head turning and shoulder shrug  were normal and symmetric. Motor: The motor testing reveals 5 over 5 strength of all 4 extremities. Good symmetric motor tone is noted throughout.  Sensory: Sensory testing is intact to soft touch on all 4 extremities. No evidence of extinction is noted.  Coordination: Cerebellar testing reveals good finger-nose-finger and heel-to-shin bilaterally.  Gait and station: Gait is normal.  Reflexes: Deep tendon reflexes are symmetric and normal bilaterally.   DIAGNOSTIC DATA (LABS, IMAGING, TESTING) - I reviewed patient records, labs, notes, testing and imaging myself where available.  Lab Results  Component Value Date   WBC 6.7 08/31/2019   HGB 13.7 08/31/2019   HCT 39.9 08/31/2019   MCV 94 08/31/2019   PLT 233 08/31/2019      Component Value Date/Time   NA 142 08/31/2019 1005   K 4.7 08/31/2019 1005   CL 108 (H) 08/31/2019 1005   CO2 24 08/31/2019 1005   GLUCOSE 100 (H) 08/31/2019 1005   GLUCOSE 93 11/13/2015 1501   BUN 17 08/31/2019 1005   CREATININE 1.12 (H) 08/31/2019 1005   CALCIUM 9.1 08/31/2019 1005   PROT 6.6 08/31/2019 1005   ALBUMIN 4.3 08/31/2019 1005   AST 14 08/31/2019 1005   ALT 14 08/31/2019 1005   ALKPHOS 116 08/31/2019 1005   BILITOT 0.3 08/31/2019 1005   GFRNONAA 56 (L) 08/31/2019 1005   GFRAA 64 08/31/2019 1005   Lab Results  Component Value Date   CHOL 174 08/31/2019   HDL 55 08/31/2019   LDLCALC 93 08/31/2019   TRIG 151 (H) 08/31/2019   CHOLHDL 3.2 08/31/2019   Lab Results  Component Value Date   HGBA1C 4.9 08/31/2019   Lab Results  Component Value Date   VITAMINB12 375 07/13/2019   Lab Results  Component Value Date   TSH 1.700 07/13/2019     ASSESSMENT AND PLAN 54 y.o. year old female  has a past medical history of Allergy, Asthma, Back pain, Bulging lumbar disc, Chicken pox, Constipation, Depression, Fatigue, Frequent headaches, Gall bladder disease, Headache, Heartburn, Hyperlipidemia, IBS (irritable bowel syndrome), Interstitial cystitis, RA (rheumatoid arthritis) (HCC), Shortness of breath on exertion, Staring episodes, Trouble in sleeping, Urine incontinence, Vertigo, Vision changes, and Vitamin D deficiency. here with:  1.  Generalized idiopathic epilepsy disorder  -Lifelong history of frequent staring spells  -MRI of the brain showed no structural abnormality  -EEG showed short lasting generalized spike slow waves, episodes can last 2 to 3 seconds, no clinical seizure activity, repeat in June 2021, EEG shows significant improvement after treatment with lamotrigine, generalized spike slow wave activity, lasting 1 to 2 seconds, suggested diagnosis of generalized epilepsy disorder  -Rash with Lamictal, was thus transitioned to Vimpat  -No staring spells of loss of awareness, lapse in time since May with Vimpat, improved with higher dose   -Continue Vimpat 150 mg twice daily  -Am inclined to continue to restrict driving 6 months from last episodes is May  -Refer to psychiatry, for better management of depression, anxiety, would recommend a transition off Wellbutrin, given the potential risk for seizure  -Follow-up in 6 months or sooner if needed  I spent 30 minutes of face-to-face and non-face-to-face time with patient.  This included previsit chart review, lab review, study review, order entry, electronic health record documentation, patient education.  Margie Ege, AGNP-C, DNP 01/11/2020, 4:15 PM [facility] [address], [address] [place], [place] [address] [phone]

## 2020-01-11 NOTE — Patient Instructions (Signed)
Continue Vimpat at current dosing Will place referral to psychiatry  See you back in 6 months

## 2020-01-18 ENCOUNTER — Other Ambulatory Visit (INDEPENDENT_AMBULATORY_CARE_PROVIDER_SITE_OTHER): Payer: Self-pay | Admitting: Bariatrics

## 2020-01-18 DIAGNOSIS — F339 Major depressive disorder, recurrent, unspecified: Secondary | ICD-10-CM

## 2020-01-27 ENCOUNTER — Encounter: Payer: Self-pay | Admitting: Neurology

## 2020-03-06 ENCOUNTER — Encounter: Payer: Self-pay | Admitting: Internal Medicine

## 2020-03-16 ENCOUNTER — Other Ambulatory Visit: Payer: Self-pay | Admitting: Internal Medicine

## 2020-03-16 DIAGNOSIS — I10 Essential (primary) hypertension: Secondary | ICD-10-CM

## 2020-03-24 ENCOUNTER — Other Ambulatory Visit: Payer: Self-pay | Admitting: *Deleted

## 2020-03-24 MED ORDER — SIMVASTATIN 20 MG PO TABS: ORAL_TABLET | ORAL | 1 refills | Status: DC

## 2020-03-24 MED ORDER — NIACIN ER (ANTIHYPERLIPIDEMIC) 1000 MG PO TBCR: 1000.0000 mg | EXTENDED_RELEASE_TABLET | Freq: Every day | ORAL | 1 refills | Status: DC

## 2020-03-24 MED ORDER — SERTRALINE HCL 100 MG PO TABS: 100.0000 mg | ORAL_TABLET | Freq: Every day | ORAL | 1 refills | Status: DC

## 2020-04-16 ENCOUNTER — Other Ambulatory Visit (INDEPENDENT_AMBULATORY_CARE_PROVIDER_SITE_OTHER): Payer: Self-pay | Admitting: Family Medicine

## 2020-04-16 DIAGNOSIS — I10 Essential (primary) hypertension: Secondary | ICD-10-CM

## 2020-05-02 ENCOUNTER — Other Ambulatory Visit (INDEPENDENT_AMBULATORY_CARE_PROVIDER_SITE_OTHER): Payer: Self-pay | Admitting: Family Medicine

## 2020-05-02 DIAGNOSIS — I10 Essential (primary) hypertension: Secondary | ICD-10-CM

## 2020-05-17 ENCOUNTER — Other Ambulatory Visit: Payer: Self-pay | Admitting: Neurology

## 2020-05-17 NOTE — Telephone Encounter (Signed)
Attempted to call pharmacy on hold for 30 minutes. Will try later.

## 2020-05-17 NOTE — Telephone Encounter (Signed)
Pt. Called stating that the pharmacy denied her prescription for Vimpat. She only has a three day supply left.

## 2020-05-18 ENCOUNTER — Encounter: Payer: Self-pay | Admitting: Diagnostic Neuroimaging

## 2020-05-18 MED ORDER — VIMPAT 150 MG PO TABS: 150.0000 mg | ORAL_TABLET | Freq: Two times a day (BID) | ORAL | 3 refills | Status: DC

## 2020-05-18 NOTE — Telephone Encounter (Signed)
Did reach pharmacy, needed new prescription.  Gabapentin prescriptions good for only 6 months, since controlled medication.  Relayed to pt.

## 2020-05-19 ENCOUNTER — Telehealth: Payer: Self-pay | Admitting: Neurology

## 2020-05-19 MED ORDER — VIMPAT 150 MG PO TABS: 150.0000 mg | ORAL_TABLET | Freq: Two times a day (BID) | ORAL | 3 refills | Status: DC

## 2020-05-19 NOTE — Telephone Encounter (Signed)
Patient called after hours call center regarding her Vimpat prescription.  The prescription was filled yesterday but the pharmacy does not have it at [facility], which is her typical pharmacy, until Monday.  She is completely out and has not had any since yesterday morning, [facility] [place] [facility], [facility] [place] [facility] [address] which [facility] it but could not transfer the prescription, given the controlled nature of the medication.  I [facility] [address] [facility] [place] and advised patient to go ahead and take her morning dose a little late today and her evening dose a little later in the night so she gets at least 2 doses in today.  Ultimately, she can probably continue to fill it at her [facility].

## 2020-05-22 NOTE — Telephone Encounter (Signed)
Spoke to pt and she did  Get finally at [facility],  She has 3 refills.  Her [facility], she may continue to get from there.  Bu she missed 2 doses but is doing fine.  Appreciated call back.

## 2020-06-15 ENCOUNTER — Other Ambulatory Visit: Payer: Self-pay | Admitting: Internal Medicine

## 2020-06-23 NOTE — Progress Notes (Signed)
I have reviewed and agreed above plan. 

## 2020-07-13 ENCOUNTER — Other Ambulatory Visit: Payer: Self-pay

## 2020-07-13 ENCOUNTER — Ambulatory Visit: Payer: Managed Care, Other (non HMO) | Admitting: Neurology

## 2020-07-13 ENCOUNTER — Encounter: Payer: Self-pay | Admitting: Neurology

## 2020-07-13 VITALS — BP 149/80 | HR 76 | Ht 67.0 in | Wt 260.5 lb

## 2020-07-13 DIAGNOSIS — G40309 Generalized idiopathic epilepsy and epileptic syndromes, not intractable, without status epilepticus: Secondary | ICD-10-CM | POA: Diagnosis not present

## 2020-07-13 DIAGNOSIS — R404 Transient alteration of awareness: Secondary | ICD-10-CM | POA: Insufficient documentation

## 2020-07-13 DIAGNOSIS — F32A Depression, unspecified: Secondary | ICD-10-CM | POA: Diagnosis not present

## 2020-07-13 DIAGNOSIS — R413 Other amnesia: Secondary | ICD-10-CM | POA: Insufficient documentation

## 2020-07-13 MED ORDER — VIMPAT 150 MG PO TABS: 150.0000 mg | ORAL_TABLET | Freq: Two times a day (BID) | ORAL | 4 refills | Status: DC

## 2020-07-13 NOTE — Progress Notes (Signed)
HISTORY OF PRESENT ILLNESS: Donna Vasquez a 55 year old female, seen in request by bariatrics physician Dr. [name], Kaleen Mask, and her primary care nurse practitioner Lorre Munroe for evaluation of frequent staring spells, initial evaluation was on July 13, 2019.  Past medical history of hyperlipidemia, hypertension, depression anxiety taking Wellbutrin XL 300 mg daily, trazodone 50 mg daily, Zoloft 100 mg daily, she had been on this combination for more than 1 year, remains depressed, especially since COVID-19, she has to work from home, reported 40 pound weight gain over past 1 year  She has strong family history of dementia, her mother, 2 maternal uncles suffered Alzheimer's disease, her father also was diagnosed with vascular dementia in his late 52s  She used to work as a Architectural technologist, around 2010, she began to notice intermittent word finding difficulties, short-term memory loss, while she was disciplining her student, she forgot why she was disciplining them in the middle of the conversation  She started a new job at [employer] 2018, went through weakness computer program training, she describes she felt overwhelmed at the beginning, but now she has been doing the same job for 3 years, she continue has low productivity, 50 to 80% as expected, she often got lost with quick switch of the program, computer screen, oftentimes she fell difficulty focusing, few minutes elapsed without her realizing it. She denied clinical seizure activity,  She also suffered obesity, sedentary lifestyle, was started on CPAP in 2020, which has helped her job performance Laboratory evaluation in 2020: Vitamin D 31.5, CMP showed mildly elevated creatinine 1.04, A1c 5.1, elevated triglyceride 160, normal TSH  UPDATE September 14 2019: She is accompanied by her husband at today's visit, together we reviewed EEG on August 02, 2019, post hyperventilation, photic stimulation, there was frequent sharp  protrusion of generalized spike slow waves, the longest episode lasting 2 to 3 seconds, there was no clinical seizure, patient did not aware of any difference,  She was started on Topamax 100 mg twice daily following abnormal EEG, she complains of brain foggy, she was started on lamotrigine titration since August 30, 2019, tolerating it well, she noticed mild improvement in her staring spells, husband did not witness event, stated that she is absent-minded all her life, patient also reported intermittent uncontrollable body jerking movement, lasting less than 1 minute,  I personally reviewed MRI of the brain without contrast on August 20, 2019, no acute abnormality, enlarged sella turcica,  Laboratory evaluations in April 2021 showed normal A1c 4.9, vitamin D 42, lipid panel showed LDL 93, triglyceride 151, CMP showed elevated creatinine 1.12, normal CBC, RPR, TSH, B12  UPDATE Jul 13 2020: She is overall doing much better, can focus on her job much better, much less staring spells, tolerating Vimpat 150 mg twice a day  She continues to have significant depression, going through psychotherapy, previously tried Topamax, mental slowing, developed rash with lamotrigine  She is on polypharmacy for depression, Zoloft 100 mg daily, trazodone,  REVIEW OF SYSTEMS: Out of a complete 14 system review of symptoms, the patient complains only of the following symptoms, and all other reviewed systems are negative.  seizures  ALLERGIES: Allergies  Allergen Reactions  . Flonase [Fluticasone Propionate] Other (See Comments)    Nasal irritation, burning, but can tolerate advair inhaler.    Gaetana Michaelis Antibiotics Hives    HOME MEDICATIONS: Outpatient Medications Prior to Visit  Medication Sig Dispense Refill  . albuterol (PROAIR HFA) 108 (90 Base) MCG/ACT inhaler Inhale 1-2  puffs into the lungs every 6 (six) hours as needed for wheezing or shortness of breath. 8.7 g 2  . aspirin 81 MG tablet Take 81 mg by  mouth daily.    [name] aspirin-acetaminophen-caffeine (EXCEDRIN MIGRAINE) 250-250-65 MG tablet Take by mouth every 6 (six) hours as needed for headache.    . Fluticasone-Salmeterol (ADVAIR) 100-50 MCG/DOSE AEPB Inhale 1 puff into the lungs 2 (two) times daily. 60 each 5  . Lacosamide (VIMPAT) 150 MG TABS Take 1 tablet (150 mg total) by mouth in the morning and at bedtime. 60 tablet 3  . losartan (COZAAR) 25 MG tablet TAKE 1 TABLET(25 MG) BY MOUTH DAILY 30 tablet 3  . montelukast (SINGULAIR) 10 MG tablet TAKE 1 TABLET (10 MG TOTAL) BY MOUTH AT BEDTIME. 30 tablet 5  . niacin (NIASPAN) 1000 MG CR tablet Take 1 tablet (1,000 mg total) by mouth at bedtime. 90 tablet 1  . omeprazole (PRILOSEC) 20 MG capsule Take 1 capsule (20 mg total) by mouth daily. 30 capsule 2  . sertraline (ZOLOFT) 100 MG tablet Take 1 tablet (100 mg total) by mouth daily. 90 tablet 1  . simvastatin (ZOCOR) 20 MG tablet TAKE 1 TABLET BY MOUTH  DAILY AT 6 PM. 90 tablet 1  . traZODone (DESYREL) 50 MG tablet TAKE 1/2 TO 1 TABLET BY  MOUTH AT BEDTIME AS NEEDED  FOR SLEEP 90 tablet 0  . buPROPion (WELLBUTRIN XL) 300 MG 24 hr tablet TAKE 1 TABLET BY MOUTH  DAILY 30 tablet 11  . dextromethorphan-guaiFENesin (MUCINEX DM) 30-600 MG 12hr tablet Take 1 tablet by mouth 2 (two) times daily as needed for cough.     . predniSONE (DELTASONE) 10 MG tablet Take 6 tabs day 1, 5 tabs day 2, 4 tabs day 3, 3 tabs day 4, 2 tabs day 5, 1 tab day 6 21 tablet 0   No facility-administered medications prior to visit.    PAST MEDICAL HISTORY: Past Medical History:  Diagnosis Date  . Allergy   . Asthma   . Back pain   . Bulging lumbar disc   . Chicken pox   . Constipation   . Depression   . Fatigue   . Frequent headaches   . Gall bladder disease   . Headache   . Heartburn   . Hyperlipidemia   . IBS (irritable bowel syndrome)   . Interstitial cystitis   . RA (rheumatoid arthritis) (HCC)   . Shortness of breath on exertion   . Staring episodes   .  Trouble in sleeping   . Urine incontinence   . Vertigo   . Vision changes   . Vitamin D deficiency     PAST SURGICAL HISTORY: Past Surgical History:  Procedure Laterality Date  . CHOLECYSTECTOMY    . cyst removed from back    . NASAL SINUS SURGERY    . TUBAL LIGATION  1999    FAMILY HISTORY: Family History  Problem Relation Age of Onset  . COPD Mother   . Heart disease Mother   . Polymyalgia rheumatica Mother   . Emphysema Mother   . Stroke Mother   . Depression Mother   . Anxiety disorder Mother   . Heart disease Father   . Hypertension Father   . Hyperlipidemia Father   . Anxiety disorder Father   . Cancer Paternal Aunt        Breast  . Breast cancer Paternal Aunt 49  . Stroke Maternal Grandmother   . Cancer Maternal  Aunt   . Other Brother        melas syndrom  . Parkinson's disease Maternal Grandfather     SOCIAL HISTORY: Social History   Socioeconomic History  . Marital status: Married    Spouse name: Jesusita Oka  . Number of children: 2  . Years of education: college  . Highest education level: Bachelor's degree (e.g., BA, AB, BS)  Occupational History  . Occupation: Editor, commissioning  Tobacco Use  . Smoking status: Never Smoker  . Smokeless tobacco: Never Used  Substance and Sexual Activity  . Alcohol use: Yes    Alcohol/week: 0.0 standard drinks    Comment: rare--wine  . Drug use: No  . Sexual activity: Not on file  Other Topics Concern  . Not on file  Social History Narrative   Lives at home with family.   Right-handed.   Caffeine use: Diet Pepsi - 8 ounces to 2 liters per day - varies.   Social Determinants of Health   Financial Resource Strain: Not on file  Food Insecurity: Not on file  Transportation Needs: Not on file  Physical Activity: Not on file  Stress: Not on file  Social Connections: Not on file  Intimate Partner Violence: Not on file   PHYSICAL EXAM  Vitals:   07/13/20 1441  BP: (!) 149/80  Pulse: 76  Weight:  260 lb 8 oz (118.2 kg)  Height: 5\' 7"  (1.702 m)   Body mass index is 40.8 kg/m.   PHYSICAL EXAMNIATION:  Gen: NAD, conversant, well nourised, well groomed                     Cardiovascular: Regular rate rhythm, no peripheral edema, warm, nontender. Eyes: Conjunctivae clear without exudates or hemorrhage Neck: Supple, no carotid bruits. Pulmonary: Clear to auscultation bilaterally   NEUROLOGICAL EXAM:  MENTAL STATUS: Speech/Cognition: Awake, alert, normal speech, oriented to history taking and casual conversation.  CRANIAL NERVES: CN II: Visual fields are full to confrontation.  Pupils are round equal and briskly reactive to light. CN III, IV, VI: extraocular movement are normal. No ptosis. CN V: Facial sensation is intact to light touch. CN VII: Face is symmetric with normal eye closure and smile. CN VIII: Hearing is normal to casual conversation CN IX, X: Palate elevates symmetrically. Phonation is normal. CN XI: Head turning and shoulder shrug are intact   MOTOR: Muscle bulk and tone are normal. Muscle strength is normal.  REFLEXES: Reflexes are 2  and symmetric at the biceps, triceps, knees and ankles. Plantar responses are flexor.  SENSORY: Intact to light touch, pinprick, positional and vibratory sensation at fingers and toes.  COORDINATION: There is no trunk or limb ataxia.    GAIT/STANCE: Posture is normal. Gait is steady with normal steps, base, arm swing and turning.    DIAGNOSTIC DATA (LABS, IMAGING, TESTING) - I reviewed patient records, labs, notes, testing and imaging myself where available.  Lab Results  Component Value Date   WBC 6.7 08/31/2019   HGB 13.7 08/31/2019   HCT 39.9 08/31/2019   MCV 94 08/31/2019   PLT 233 08/31/2019      Component Value Date/Time   NA 142 08/31/2019 1005   K 4.7 08/31/2019 1005   CL 108 (H) 08/31/2019 1005   CO2 24 08/31/2019 1005   GLUCOSE 100 (H) 08/31/2019 1005   GLUCOSE 93 11/13/2015 1501   BUN 17  08/31/2019 1005   CREATININE 1.12 (H) 08/31/2019 1005   CALCIUM 9.1 08/31/2019  1005   PROT 6.6 08/31/2019 1005   ALBUMIN 4.3 08/31/2019 1005   AST 14 08/31/2019 1005   ALT 14 08/31/2019 1005   ALKPHOS 116 08/31/2019 1005   BILITOT 0.3 08/31/2019 1005   GFRNONAA 56 (L) 08/31/2019 1005   GFRAA 64 08/31/2019 1005   Lab Results  Component Value Date   CHOL 174 08/31/2019   HDL 55 08/31/2019   LDLCALC 93 08/31/2019   TRIG 151 (H) 08/31/2019   CHOLHDL 3.2 08/31/2019   Lab Results  Component Value Date   HGBA1C 4.9 08/31/2019   Lab Results  Component Value Date   VITAMINB12 375 07/13/2019   Lab Results  Component Value Date   TSH 1.700 07/13/2019    ASSESSMENT AND PLAN 55 y.o. year old female   Generalized idiopathic epilepsy disorder Mood disorder  MRI of the brain showed no significant abnormality,  EEG showed short lasting generalized spike slow waves, episodes can last 2 to 3 seconds, no clinical seizure activity, repeat in June 2021, EEG shows significant improvement after treatment with lamotrigine, generalized spike slow wave activity, lasting 1 to 2 seconds, suggested diagnosis of generalized epilepsy disorder  Mental slowing with Topamax, rash with Lamictal, was thus transitioned to Vimpat 150 mg twice daily, reported significant improvement,  We'll repeat EEG   Levert Feinstein, M.D. Ph.D.  [facility] [address] [place], [place] [address] Phone: [phone] Fax:      [phone]

## 2020-07-19 ENCOUNTER — Ambulatory Visit: Payer: Managed Care, Other (non HMO) | Admitting: Neurology

## 2020-07-19 DIAGNOSIS — G40309 Generalized idiopathic epilepsy and epileptic syndromes, not intractable, without status epilepticus: Secondary | ICD-10-CM

## 2020-07-19 DIAGNOSIS — R404 Transient alteration of awareness: Secondary | ICD-10-CM

## 2020-07-19 DIAGNOSIS — R413 Other amnesia: Secondary | ICD-10-CM

## 2020-08-04 NOTE — Procedures (Signed)
   HISTORY: 55 year old female, with history of depression, presenting with frequent short-term memory loss, previous abnormal EEG, improved with better treatment of depression, and epileptic medication   TECHNIQUE:  This is a routine 16 channel EEG recording with one channel devoted to a limited EKG recording.  It was performed during wakefulness, drowsiness and asleep.  Hyperventilation and photic stimulation were performed as activating procedures.  There are minimum muscle and movement artifact noted.  Upon maximum arousal, posterior dominant waking rhythm consistent of mildly dysrhythmic alpha range activity, with frequency of 10 hz. Activities are symmetric over the bilateral posterior derivations and attenuated with eye opening.  Hyperventilation produced mild/moderate buildup with higher amplitude and the slower activities noted.  Photic stimulation did not alter the tracing.  During EEG recording, patient developed drowsiness and entered no deeper stage of sleep was achieved  During EEG recording, there was no epileptiform discharge noted.  88 bpm  EKG demonstrate sinus rhythm, with heart rate of 88 bpm  CONCLUSION: This is a  normal awake EEG.  There is no electrodiagnostic evidence of epileptiform discharge.  Levert Feinstein, M.D. Ph.D.  [facility] [address] [place], [place] [address] Phone: [phone] Fax:      [phone]

## 2020-08-11 ENCOUNTER — Other Ambulatory Visit: Payer: Self-pay | Admitting: Internal Medicine

## 2020-08-21 ENCOUNTER — Ambulatory Visit: Payer: Managed Care, Other (non HMO) | Admitting: Internal Medicine

## 2020-08-21 ENCOUNTER — Encounter: Payer: Self-pay | Admitting: Internal Medicine

## 2020-08-21 ENCOUNTER — Other Ambulatory Visit: Payer: Self-pay

## 2020-08-21 VITALS — BP 132/82 | HR 70 | Temp 97.0°F | Wt 262.0 lb

## 2020-08-21 DIAGNOSIS — B070 Plantar wart: Secondary | ICD-10-CM | POA: Diagnosis not present

## 2020-08-21 MED ORDER — FLUTICASONE-SALMETEROL 100-50 MCG/DOSE IN AEPB: 1.0000 | INHALATION_SPRAY | Freq: Two times a day (BID) | RESPIRATORY_TRACT | 0 refills | Status: DC

## 2020-08-21 MED ORDER — MONTELUKAST SODIUM 10 MG PO TABS: ORAL_TABLET | ORAL | 0 refills | Status: DC

## 2020-08-21 NOTE — Progress Notes (Signed)
Subjective:    Patient ID: Donna Vasquez, female    DOB: 10/28/1965, 55 y.o.   MRN: [id number]  HPI  Pt presents to the clinic today with c/o a wart on the bottom of her right  foot. She noticed this she is also she has a hard time increased callus area today it is multiple plantar warts are different to plan.  Letter he also be over [facility] but just catches a lot the last few years so he has still can practice is just fell from exercise you are at however it is starting to become more painful, especially with ambulation. She has tried Compound W without any relief of symptoms.   Review of Systems  Past Medical History:  Diagnosis Date  . Allergy   . Asthma   . Back pain   . Bulging lumbar disc   . Chicken pox   . Constipation   . Depression   . Fatigue   . Frequent headaches   . Gall bladder disease   . Headache   . Heartburn   . Hyperlipidemia   . IBS (irritable bowel syndrome)   . Interstitial cystitis   . RA (rheumatoid arthritis) (HCC)   . Shortness of breath on exertion   . Staring episodes   . Trouble in sleeping   . Urine incontinence   . Vertigo   . Vision changes   . Vitamin D deficiency     Current Outpatient Medications  Medication Sig Dispense Refill  . albuterol (PROAIR HFA) 108 (90 Base) MCG/ACT inhaler Inhale 1-2 puffs into the lungs every 6 (six) hours as needed for wheezing or shortness of breath. 8.7 g 2  . aspirin 81 MG tablet Take 81 mg by mouth daily.    [name] aspirin-acetaminophen-caffeine (EXCEDRIN MIGRAINE) 250-250-65 MG tablet Take by mouth every 6 (six) hours as needed for headache.    . Fluticasone-Salmeterol (ADVAIR) 100-50 MCG/DOSE AEPB Inhale 1 puff into the lungs 2 (two) times daily. 60 each 5  . Lacosamide (VIMPAT) 150 MG TABS Take 1 tablet (150 mg total) by mouth in the morning and at bedtime. 180 tablet 4  . losartan (COZAAR) 25 MG tablet TAKE 1 TABLET(25 MG) BY MOUTH DAILY 30 tablet 3  . montelukast (SINGULAIR) 10 MG tablet TAKE 1  TABLET (10 MG TOTAL) BY MOUTH AT BEDTIME. 30 tablet 5  . niacin (NIASPAN) 1000 MG CR tablet TAKE 1 TABLET BY MOUTH AT  BEDTIME 90 tablet 0  . omeprazole (PRILOSEC) 20 MG capsule Take 1 capsule (20 mg total) by mouth daily. 30 capsule 2  . sertraline (ZOLOFT) 100 MG tablet Take 1 tablet (100 mg total) by mouth daily. 90 tablet 1  . simvastatin (ZOCOR) 20 MG tablet TAKE 1 TABLET BY MOUTH  DAILY AT 6PM 90 tablet 0  . traZODone (DESYREL) 50 MG tablet TAKE 1/2 TO 1 TABLET BY  MOUTH AT BEDTIME AS NEEDED  FOR SLEEP 90 tablet 0   No current facility-administered medications for this visit.    Allergies  Allergen Reactions  . Flonase [Fluticasone Propionate] Other (See Comments)    Nasal irritation, burning, but can tolerate advair inhaler.    . Sulfa Antibiotics Hives    Family History  Problem Relation Age of Onset  . COPD Mother   . Heart disease Mother   . Polymyalgia rheumatica Mother   . Emphysema Mother   . Stroke Mother   . Depression Mother   . Anxiety disorder Mother   .  Heart disease Father   . Hypertension Father   . Hyperlipidemia Father   . Anxiety disorder Father   . Cancer Paternal Aunt        Breast  . Breast cancer Paternal Aunt 58  . Stroke Maternal Grandmother   . Cancer Maternal Aunt   . Other Brother        melas syndrom  . Parkinson's disease Maternal Grandfather     Social History   Socioeconomic History  . Marital status: Married    Spouse name: Jesusita Oka  . Number of children: 2  . Years of education: college  . Highest education level: Bachelor's degree (e.g., BA, AB, BS)  Occupational History  . Occupation: Editor, commissioning  Tobacco Use  . Smoking status: Never Smoker  . Smokeless tobacco: Never Used  Substance and Sexual Activity  . Alcohol use: Yes    Alcohol/week: 0.0 standard drinks    Comment: rare--wine  . Drug use: No  . Sexual activity: Not on file  Other Topics Concern  . Not on file  Social History Narrative   Lives at  home with family.   Right-handed.   Caffeine use: Diet Pepsi - 8 ounces to 2 liters per day - varies.   Social Determinants of Health   Financial Resource Strain: Not on file  Food Insecurity: Not on file  Transportation Needs: Not on file  Physical Activity: Not on file  Stress: Not on file  Social Connections: Not on file  Intimate Partner Violence: Not on file     Constitutional: Denies fever, malaise, fatigue, headache or abrupt weight changes.  Respiratory: Denies difficulty breathing, shortness of breath, cough or sputum production.   Cardiovascular: Denies chest pain, chest tightness, palpitations or swelling in the hands or feet.  Skin: Pt reports wart on bottom right foot. Denies redness, rashes, or ulcercations.    No other specific complaints in a complete review of systems (except as listed in HPI above).     Objective:   Physical Exam   BP 132/82   Pulse 70   Temp (!) 97 F (36.1 C) (Temporal)   Wt 262 lb (118.8 kg)   SpO2 98%   BMI 41.04 kg/m   Wt Readings from Last 3 Encounters:  07/13/20 260 lb 8 oz (118.2 kg)  01/11/20 259 lb (117.5 kg)  11/11/19 261 lb (118.4 kg)    General: Appears her stated age, obese, in NAD. Skin: 5 mm plantar wart noted of plantar aspect of MTP, right foot. Cardiovascular: Normal rate. Pulmonary/Chest: Normal effort. Neurological: Alert and oriented.    BMET    Component Value Date/Time   NA 142 08/31/2019 1005   K 4.7 08/31/2019 1005   CL 108 (H) 08/31/2019 1005   CO2 24 08/31/2019 1005   GLUCOSE 100 (H) 08/31/2019 1005   GLUCOSE 93 11/13/2015 1501   BUN 17 08/31/2019 1005   CREATININE 1.12 (H) 08/31/2019 1005   CALCIUM 9.1 08/31/2019 1005   GFRNONAA 56 (L) 08/31/2019 1005   GFRAA 64 08/31/2019 1005    Lipid Panel     Component Value Date/Time   CHOL 174 08/31/2019 1005   TRIG 151 (H) 08/31/2019 1005   HDL 55 08/31/2019 1005   CHOLHDL 3.2 08/31/2019 1005   CHOLHDL 3 11/13/2015 1501   VLDL 29.8  11/13/2015 1501   LDLCALC 93 08/31/2019 1005    CBC    Component Value Date/Time   WBC 6.7 08/31/2019 1005   WBC 8.6 11/13/2015 1501  RBC 4.23 08/31/2019 1005   RBC 4.49 11/13/2015 1501   HGB 13.7 08/31/2019 1005   HCT 39.9 08/31/2019 1005   PLT 233 08/31/2019 1005   MCV 94 08/31/2019 1005   MCH 32.4 08/31/2019 1005   MCHC 34.3 08/31/2019 1005   MCHC 34.0 11/13/2015 1501   RDW 12.5 08/31/2019 1005   LYMPHSABS 1.4 10/16/2017 1000   EOSABS 0.2 10/16/2017 1000   BASOSABS 0.0 10/16/2017 1000    Hgb A1C Lab Results  Component Value Date   HGBA1C 4.9 08/31/2019           Assessment & Plan:  Plantar Wart:  Discussed risks of procedure including incomplete destruction requiring multiple treatments, pain and scarring Informed consent obtained verbally Cryotherapy to affected lesion for 6 secs x 3 cycles Pt tolerated well, no complications  Return precautions discussed  Nicki Reaper, NP This visit occurred during the SARS-CoV-2 public health emergency.  Safety protocols were in place, including screening questions prior to the visit, additional usage of staff PPE, and extensive cleaning of exam room while observing appropriate contact time as indicated for disinfecting solutions.

## 2020-08-21 NOTE — Patient Instructions (Signed)
Warts  Warts are small growths on the skin. They are common, and they are caused by a virus. Warts can be found on many parts of the body. A person may have one wart or many warts. Most warts will go away on their own with time, but this could take many months to a few years. Treatments may be done if needed. What are the causes? Warts are caused by a type of virus that is called HPV.  This virus can spread from person to person through touching.  Warts can also spread to other parts of the body when a person scratches a wart and then scratches normal skin. What increases the risk? You are more likely to get warts if:  You are 10-20 years old.  You have a weak body defense system (immune system).  You are Caucasian. What are the signs or symptoms? The main symptom of this condition is small growths on the skin. Warts may:  Be round, oval, or have an uneven shape.  Feel rough to the touch.  Be the color of your skin or light yellow, brown, or gray.  Often be less than  inch (1.3 cm) in size.  Go away and then come back again. Most warts do not hurt, but some can hurt if they are large or if they are on the bottom of your feet. How is this diagnosed? A wart can often be diagnosed by how it looks. In some cases, the doctor might remove a little bit of the wart to test it (biopsy). How is this treated? Most of the time, warts do not need treatment. Sometimes people want warts removed. If treatment is needed or wanted, options may include:  Putting creams or patches with medicine in them on the wart.  Putting duct tape over the top of the wart.  Freezing the wart.  Burning the wart with: ? A laser. ? An electric probe.  Giving a shot of medicine into the wart to help the body's defense system fight off the wart.  Surgery to remove the wart. Follow these instructions at home: Medicines  Apply over-the-counter and prescription medicines only as told by your doctor.  Do  not apply over-the-counter wart medicines to your face or genitals before you ask your doctor if it is okay to do that. Lifestyle  Keep your body's defense system healthy. To do this: ? Eat a healthy diet. ? Get enough sleep. ? Do not use any products that contain nicotine or tobacco, such as cigarettes and e-cigarettes. If you need help quitting, ask your doctor. General instructions  Wash your hands after you touch a wart.  Do not scratch or pick at a wart.  Avoid shaving hair that is over a wart.  Keep all follow-up visits as told by your doctor. This is important.   Contact a doctor if:  Your warts do not get better after treatment.  You have redness, swelling, or pain at the site of a wart.  You have bleeding from a wart, and the bleeding does not stop when you put light pressure on the wart.  You have diabetes and you get a wart. Summary  Warts are small growths on the skin. They are common, and they are caused by a virus.  Most of the time, warts do not need treatment. Sometimes people want warts removed. If treatment is needed or wanted, there are many options.  Apply over-the-counter and prescription medicines only as told by your doctor.    Wash your hands after you touch a wart.  Keep all follow-up visits as told by your doctor. This is important. This information is not intended to replace advice given to you by your health care provider. Make sure you discuss any questions you have with your health care provider. Document Revised: 09/23/2017 Document Reviewed: 09/23/2017 Elsevier Patient Education  2021 [employer]  

## 2020-09-02 ENCOUNTER — Other Ambulatory Visit: Payer: Self-pay | Admitting: Internal Medicine

## 2020-09-03 NOTE — Progress Notes (Signed)
Donna Hritz T. Jefferie Holston, MD, CAQ Sports Medicine  Primary Care and Sports Medicine [facility] at [facility] [address] [place], [address]  Phone: [phone]  FAX: [phone]  Donna Vasquez - 54 y.o. female  MRN [id number]  Date of Birth: 01/03/1966  Date: 09/04/2020  PCP: Lorre Munroe, NP  Referral: Lorre Munroe, NP  Chief Complaint  Patient presents with  . Plantar Warts    Right Foot  . Dizziness    This visit occurred during the SARS-CoV-2 public health emergency.  Safety protocols were in place, including screening questions prior to the visit, additional usage of staff PPE, and extensive cleaning of exam room while observing appropriate contact time as indicated for disinfecting solutions.   Subjective:   Donna Vasquez is a 55 y.o. very pleasant female patient with Body mass index is 39.7 kg/m. who presents with the following:  She is follow-up on a plantar wart on the R foot, s/p cryo x 1 by Mrs. Baity.  She is a very nice young lady, she presents with a right-sided plantar wart of the forefoot this been present for at least greater than 1 year.  This does have pain when she is ambulating with a very stiff.  She has tried multiple over-the-counter remedies including Compound W and freezing spray.  She did try some duct tape for about 3 or 4 days.  She has had wheezing off with cryotherapy, most recently she did not have much of a blistering effect and he did not feel cold most if she has had in the past.  BPV:  She has a recent history of vertigo with rotating to her left side and with rising after being bent over.   Review of Systems is noted in the HPI, as appropriate  Objective:   BP 120/62   Pulse 85   Temp 97.7 F (36.5 C) (Temporal)   Ht 5\' 7"  (1.702 m)   Wt 253 lb 8 oz (115 kg)   SpO2 96%   BMI 39.70 kg/m   GEN: No acute distress; alert,appropriate. PULM: Breathing comfortably in no respiratory  distress PSYCH: Normally interactive.   1 cm obvious plantar wart on the forefoot on the R  Laboratory and Imaging Data:  Assessment and Plan:     ICD-10-CM   1. Plantar wart of right foot  B07.0 Cryotherapy, skin lesion  2. BPV (benign positional vertigo), left  H81.12    Reviewed plantar wart in general.  For now, aggressive cryotherapy, start duct tape.  She can f/u with me in 1 month for repeat.   For BPV, it is not bothering her much.  I don't think there is all that much to do but observe.  Liquid nitrogen was applied for 10-12 seconds x 3 to the plantar wart and the expected blistering or scabbing reaction explained. Do not pick at the areas. Patient reminded to expect hypopigmented scars from the procedure. Return if lesions fail to fully resolve.  Signed,  . Jorell Agne, MD   Outpatient Encounter Medications as of 09/04/2020  Medication Sig  . albuterol (PROAIR HFA) 108 (90 Base) MCG/ACT inhaler Inhale 1-2 puffs into the lungs every 6 (six) hours as needed for wheezing or shortness of breath.  09/06/2020 aspirin 81 MG tablet Take 81 mg by mouth daily.  [name] aspirin-acetaminophen-caffeine (EXCEDRIN MIGRAINE) 250-250-65 MG tablet Take by mouth every 6 (six) hours as needed for headache.  . Fluticasone-Salmeterol (ADVAIR) 100-50 MCG/DOSE AEPB  Inhale 1 puff into the lungs 2 (two) times daily.  . Lacosamide (VIMPAT) 150 MG TABS Take 1 tablet (150 mg total) by mouth in the morning and at bedtime.  [name] losartan (COZAAR) 25 MG tablet TAKE 1 TABLET(25 MG) BY MOUTH DAILY  . montelukast (SINGULAIR) 10 MG tablet TAKE 1 TABLET (10 MG TOTAL) BY MOUTH AT BEDTIME.  . niacin (NIASPAN) 1000 MG CR tablet TAKE 1 TABLET BY MOUTH AT  BEDTIME  . omeprazole (PRILOSEC) 20 MG capsule Take 1 capsule (20 mg total) by mouth daily.  . simvastatin (ZOCOR) 20 MG tablet TAKE 1 TABLET BY MOUTH  DAILY AT 6PM  . traZODone (DESYREL) 50 MG tablet TAKE 1/2 TO 1 TABLET BY  MOUTH AT BEDTIME AS NEEDED  FOR SLEEP  .  VYVANSE 50 MG capsule Take 50 mg by mouth every morning.  . [DISCONTINUED] sertraline (ZOLOFT) 100 MG tablet Take 1 tablet (100 mg total) by mouth daily.   No facility-administered encounter medications on file as of 09/04/2020.

## 2020-09-04 ENCOUNTER — Other Ambulatory Visit: Payer: Self-pay

## 2020-09-04 ENCOUNTER — Ambulatory Visit: Payer: Managed Care, Other (non HMO) | Admitting: Family Medicine

## 2020-09-04 ENCOUNTER — Encounter: Payer: Self-pay | Admitting: Family Medicine

## 2020-09-04 VITALS — BP 120/62 | HR 85 | Temp 97.7°F | Ht 67.0 in | Wt 253.5 lb

## 2020-09-04 DIAGNOSIS — H8112 Benign paroxysmal vertigo, left ear: Secondary | ICD-10-CM

## 2020-09-04 DIAGNOSIS — B070 Plantar wart: Secondary | ICD-10-CM | POA: Diagnosis not present

## 2020-09-07 ENCOUNTER — Encounter: Payer: Self-pay | Admitting: Family Medicine

## 2020-09-08 ENCOUNTER — Other Ambulatory Visit: Payer: Self-pay | Admitting: Internal Medicine

## 2020-09-20 ENCOUNTER — Other Ambulatory Visit: Payer: Self-pay | Admitting: Internal Medicine

## 2020-09-20 DIAGNOSIS — I10 Essential (primary) hypertension: Secondary | ICD-10-CM

## 2020-09-20 NOTE — Telephone Encounter (Signed)
   Notes to clinic: Looks like patient seen Dr. Patsy Lager  Review for refill  May not be following prvider   Requested Prescriptions  Pending Prescriptions Disp Refills   losartan (COZAAR) 25 MG tablet [Pharmacy Med Name: LOSARTAN 25MG  TABLETS] 30 tablet 3    Sig: TAKE 1 TABLET(25 MG) BY MOUTH DAILY      There is no refill protocol information for this order

## 2020-10-17 NOTE — Progress Notes (Addendum)
    Nishan Ovens T. Felipe Paluch, MD, CAQ Sports Medicine  Primary Care and Sports Medicine [facility] at [facility] [address] [place], [address]  Phone: [phone]  FAX: [phone]  Donna Vasquez - 55 y.o. female  MRN [id number]  Date of Birth: [birth year]  Date: 10/18/2020  PCP: Lorre Munroe, NP  Referral: Lorre Munroe, NP  Chief Complaint  Patient presents with  . Plantar Warts    1 month follow up     This visit occurred during the SARS-CoV-2 public health emergency.  Safety protocols were in place, including screening questions prior to the visit, additional usage of staff PPE, and extensive cleaning of exam room while observing appropriate contact time as indicated for disinfecting solutions.   Subjective:   Donna Vasquez is a 55 y.o. very pleasant female patient with Body mass index is 37.43 kg/m. who presents with the following:  6 week follow-up after I saw her 09/04/2020 for a plantar wart.  I did cryotherapy and had her start Duct tape.  Skin debridementt - 20 min  Her plantar wart looks much better and you can actually see the wart.  She has some surrounding elevated whitish tissue surrounding the wart.  We talked about risks and benefits including pain and bleeding, and she verbally consented to procedure.  I debrided this tissue with a #15 blade surrounding the plantar wart to good effect down to the layer of painful skin with some minimal bleeding.  Debrided around the plantar wart and applied cryo therapy to the area x3 for 10 seconds. She tolerated debridement and cryotherapy well and without incident.  Indication: Plantar wart.    ICD-10-CM   1. Plantar wart of right foot  B07.0   2. Need for Tdap vaccination  Z23 Tdap vaccine greater than or equal to 7yo IM    Orders Placed This Encounter  Procedures  . Tdap vaccine greater than or equal to 7yo IM    Follow-up: No follow-ups on file.  Signed,  Elpidio Galea.  Terrah Decoster, MD

## 2020-10-18 ENCOUNTER — Other Ambulatory Visit: Payer: Self-pay

## 2020-10-18 ENCOUNTER — Ambulatory Visit: Payer: Managed Care, Other (non HMO) | Admitting: Family Medicine

## 2020-10-18 ENCOUNTER — Encounter: Payer: Self-pay | Admitting: Family Medicine

## 2020-10-18 VITALS — BP 114/68 | HR 78 | Temp 97.7°F | Ht 67.0 in | Wt 239.0 lb

## 2020-10-18 DIAGNOSIS — Z23 Encounter for immunization: Secondary | ICD-10-CM

## 2020-10-18 DIAGNOSIS — B070 Plantar wart: Secondary | ICD-10-CM | POA: Diagnosis not present

## 2020-10-22 ENCOUNTER — Other Ambulatory Visit: Payer: Self-pay | Admitting: Internal Medicine

## 2020-10-23 ENCOUNTER — Other Ambulatory Visit: Payer: Self-pay | Admitting: Internal Medicine

## 2020-10-25 NOTE — Telephone Encounter (Signed)
Refill request Singulair Last refill 08/21/20 #90 Last office visit 10/18/20 acute No upcoming appointment scheduled

## 2020-10-25 NOTE — Telephone Encounter (Signed)
Refill request Advair Last office visit 10/18/20 acute Last refill 08/21/20 #180 No upcoming appointment scheduled See allergy/contraindication

## 2020-11-01 ENCOUNTER — Other Ambulatory Visit: Payer: Self-pay | Admitting: Internal Medicine

## 2020-11-03 NOTE — Telephone Encounter (Signed)
Last office visit 10/18/20 acute Simvastatin last refill 08/14/20 #90 Niacin last refill 08/14/20 #90 Last lipid 08/31/19 No upcoming appointment scheduled

## 2020-11-27 ENCOUNTER — Other Ambulatory Visit: Payer: Self-pay | Admitting: Internal Medicine

## 2020-11-27 NOTE — Telephone Encounter (Signed)
Requested medication (s) are due for refill today: Yes  Requested medication (s) are on the active medication list: Yes  Last refill:  09/04/20  Future visit scheduled: No  Notes to clinic:  See request.    Requested Prescriptions  Pending Prescriptions Disp Refills   sertraline (ZOLOFT) 100 MG tablet [Pharmacy Med Name: Sertraline HCl 100 MG Oral Tablet] 90 tablet 3    Sig: TAKE 1 TABLET BY MOUTH  DAILY      There is no refill protocol information for this order

## 2020-11-30 LAB — HEMOGLOBIN A1C: Hemoglobin A1C: 5.3

## 2020-11-30 LAB — COMPREHENSIVE METABOLIC PANEL: GFR calc non Af Amer: 89

## 2020-11-30 LAB — BASIC METABOLIC PANEL
Creatinine: 0.8 (ref 0.5–1.1)
Glucose: 95

## 2020-11-30 LAB — LIPID PANEL
Cholesterol: 139 (ref 0–200)
HDL: 56 (ref 35–70)
LDL Cholesterol: 65
LDl/HDL Ratio: 2.5
Triglycerides: 99 (ref 40–160)

## 2020-12-01 ENCOUNTER — Other Ambulatory Visit: Payer: Self-pay

## 2020-12-01 ENCOUNTER — Ambulatory Visit (INDEPENDENT_AMBULATORY_CARE_PROVIDER_SITE_OTHER): Payer: Managed Care, Other (non HMO) | Admitting: Internal Medicine

## 2020-12-01 ENCOUNTER — Encounter: Payer: Self-pay | Admitting: Internal Medicine

## 2020-12-01 VITALS — BP 119/60 | HR 87 | Temp 97.9°F | Resp 17 | Ht 67.0 in | Wt 232.4 lb

## 2020-12-01 DIAGNOSIS — Z6839 Body mass index (BMI) 39.0-39.9, adult: Secondary | ICD-10-CM

## 2020-12-01 DIAGNOSIS — K582 Mixed irritable bowel syndrome: Secondary | ICD-10-CM

## 2020-12-01 DIAGNOSIS — Z0001 Encounter for general adult medical examination with abnormal findings: Secondary | ICD-10-CM | POA: Diagnosis not present

## 2020-12-01 DIAGNOSIS — I10 Essential (primary) hypertension: Secondary | ICD-10-CM

## 2020-12-01 DIAGNOSIS — E782 Mixed hyperlipidemia: Secondary | ICD-10-CM

## 2020-12-01 DIAGNOSIS — G4733 Obstructive sleep apnea (adult) (pediatric): Secondary | ICD-10-CM | POA: Diagnosis not present

## 2020-12-01 DIAGNOSIS — F419 Anxiety disorder, unspecified: Secondary | ICD-10-CM | POA: Diagnosis not present

## 2020-12-01 DIAGNOSIS — F32A Depression, unspecified: Secondary | ICD-10-CM

## 2020-12-01 DIAGNOSIS — J4540 Moderate persistent asthma, uncomplicated: Secondary | ICD-10-CM | POA: Diagnosis not present

## 2020-12-01 DIAGNOSIS — G40A09 Absence epileptic syndrome, not intractable, without status epilepticus: Secondary | ICD-10-CM

## 2020-12-01 DIAGNOSIS — G44229 Chronic tension-type headache, not intractable: Secondary | ICD-10-CM | POA: Diagnosis not present

## 2020-12-01 DIAGNOSIS — F5104 Psychophysiologic insomnia: Secondary | ICD-10-CM

## 2020-12-01 DIAGNOSIS — N1831 Chronic kidney disease, stage 3a: Secondary | ICD-10-CM

## 2020-12-01 DIAGNOSIS — M069 Rheumatoid arthritis, unspecified: Secondary | ICD-10-CM

## 2020-12-01 DIAGNOSIS — K219 Gastro-esophageal reflux disease without esophagitis: Secondary | ICD-10-CM

## 2020-12-01 NOTE — Progress Notes (Signed)
Subjective:    Patient ID: Donna Vasquez, female    DOB: [birth year], 55 y.o.   MRN: [id number]  HPI  Patient presents to clinic today for her annual exam.  She is also due to follow-up chronic conditions.  Allergy Induced Asthma: Moderate, persistent.  Managed on Advair, Singulair and Albuterol as needed.  There are no PFTs on file.  OSA: She averages 8 hours of sleep per night with the use of her CPAP.  Sleep study from 03/2018 reviewed.  Anxiety and Depression: Chronic dysthymia, managed on Sertraline and Wellbutrin.  She is not currently seeing a therapist.  She denies SI/HI.  Frequent Headaches: These occur intermittently. She takes Excedrin Migraine as needed with good relief of symptoms.  IBS: Alternating constipation and diarrhea.  She controls this with diet.  She is not currently following GI.  RA: Juvenile, mainly in her knees and back.  She is not taking anything for this.  She does not follow with rheumatology.  GERD: Triggered by stress.  She is not taking any medications for this at this time.  There is no upper GI on file.  Insomnia: She has difficulty staying asleep.  She takes Trazodone nightly with good relief of symptoms.  Sleep study from 03/2018 reviewed.  HLD: Her last LDL was 93, triglycerides 409, 08/2019. She denies myalgias on Simvastatin. She takes Niacin as well. She tries to consume a low fat diet.  HTN: Her BP today is 119/60. She is taking Losartan as prescribed. ECG from 11/2017 reviewed.  Absence seizure's: She is not currently taking any medications for this.  She is following neurology.  CKD 3: Her last creatinine was 1.12, GFR, 56, 08/2019. She is on Losartan for renal protection. She does not follow with nephrology.  Flu: 02/2019 Tetanus: 10/2020 Pneumovax: 08/2019 COVID: Gala Murdoch x 2 Pap smear: 09/2017 Mammogram: 09/2019 Colon screening: never Vision screening: annually Dentist: annually   Review of Systems  Past Medical History:   Diagnosis Date   Allergy    Asthma    Back pain    Bulging lumbar disc    Chicken pox    Constipation    Depression    Fatigue    Frequent headaches    Gall bladder disease    Headache    Heartburn    Hyperlipidemia    IBS (irritable bowel syndrome)    Interstitial cystitis    RA (rheumatoid arthritis) (HCC)    Shortness of breath on exertion    Staring episodes    Trouble in sleeping    Urine incontinence    Vertigo    Vision changes    Vitamin D deficiency     Current Outpatient Medications  Medication Sig Dispense Refill   ADVAIR DISKUS 100-50 MCG/ACT AEPB USE 1 INHALATION BY MOUTH  TWICE DAILY 180 each 3   albuterol (PROAIR HFA) 108 (90 Base) MCG/ACT inhaler Inhale 1-2 puffs into the lungs every 6 (six) hours as needed for wheezing or shortness of breath. 8.7 g 2   aspirin 81 MG tablet Take 81 mg by mouth daily.     aspirin-acetaminophen-caffeine (EXCEDRIN MIGRAINE) 250-250-65 MG tablet Take by mouth every 6 (six) hours as needed for headache.     Lacosamide (VIMPAT) 150 MG TABS Take 1 tablet (150 mg total) by mouth in the morning and at bedtime. 180 tablet 4   losartan (COZAAR) 25 MG tablet TAKE 1 TABLET(25 MG) BY MOUTH DAILY 90 tablet 0   montelukast (SINGULAIR) 10 MG  tablet TAKE 1 TABLET BY MOUTH AT  BEDTIME 90 tablet 3   niacin (NIASPAN) 1000 MG CR tablet TAKE 1 TABLET BY MOUTH AT  BEDTIME 90 tablet 0   omeprazole (PRILOSEC) 20 MG capsule Take 1 capsule (20 mg total) by mouth daily. 30 capsule 2   sertraline (ZOLOFT) 100 MG tablet TAKE 1 TABLET BY MOUTH  DAILY 90 tablet 3   simvastatin (ZOCOR) 20 MG tablet TAKE 1 TABLET BY MOUTH  DAILY AT 6PM 90 tablet 0   traZODone (DESYREL) 50 MG tablet TAKE 1/2 TO 1 TABLET BY  MOUTH AT BEDTIME AS NEEDED  FOR SLEEP 90 tablet 0   VYVANSE 50 MG capsule Take 50 mg by mouth every morning.     No current facility-administered medications for this visit.    Allergies  Allergen Reactions   Flonase [Fluticasone Propionate] Other  (See Comments)    Nasal irritation, burning, but can tolerate advair inhaler.     Sulfa Antibiotics Hives    Family History  Problem Relation Age of Onset   COPD Mother    Heart disease Mother    Polymyalgia rheumatica Mother    Emphysema Mother    Stroke Mother    Depression Mother    Anxiety disorder Mother    Heart disease Father    Hypertension Father    Hyperlipidemia Father    Anxiety disorder Father    Cancer Paternal Aunt        Breast   Breast cancer Paternal Aunt 59   Stroke Maternal Grandmother    Cancer Maternal Aunt    Other Brother        melas syndrom   Parkinson's disease Maternal Grandfather     Social History   Socioeconomic History   Marital status: Married    Spouse name: Jesusita Oka   Number of children: 2   Years of education: college   Highest education level: Bachelor's degree (e.g., BA, AB, BS)  Occupational History   Occupation: Editor, commissioning  Tobacco Use   Smoking status: Never   Smokeless tobacco: Never  Substance and Sexual Activity   Alcohol use: Yes    Alcohol/week: 0.0 standard drinks    Comment: rare--wine   Drug use: No   Sexual activity: Not on file  Other Topics Concern   Not on file  Social History Narrative   Lives at home with family.   Right-handed.   Caffeine use: Diet Pepsi - 8 ounces to 2 liters per day - varies.   Social Determinants of Health   Financial Resource Strain: Not on file  Food Insecurity: Not on file  Transportation Needs: Not on file  Physical Activity: Not on file  Stress: Not on file  Social Connections: Not on file  Intimate Partner Violence: Not on file     Constitutional: Pt reports headaches. Denies fever, malaise, fatigue, or abrupt weight changes.  HEENT: Pt reports post nasal drip. Denies eye pain, eye redness, ear pain, ringing in the ears, wax buildup, runny nose, nasal congestion, bloody nose, or sore throat. Respiratory: Pt reports intermittent SOB. Denies difficulty  breathing, cough or sputum production.   Cardiovascular: Denies chest pain, chest tightness, palpitations or swelling in the hands or feet.  Gastrointestinal: Pt reports alternating constipation and diarrhea. Denies abdominal pain, bloating, or blood in the stool.  GU: Denies urgency, frequency, pain with urination, burning sensation, blood in urine, odor or discharge. Musculoskeletal: Pt reports intermittent joint pain. Denies decrease in range of motion, difficulty  with gait, muscle pain or joint swelling.  Skin: Denies redness, rashes, lesions or ulcercations.  Neurological: Pt reports insomnia. Denies dizziness, difficulty with memory, difficulty with speech or problems with balance and coordination.  Psych: Pt has a history of anxiety and depression. Denies SI/HI.  No other specific complaints in a complete review of systems (except as listed in HPI above).     Objective:   Physical Exam  BP 119/60 (BP Location: Right Arm, Patient Position: Sitting, Cuff Size: Large)   Pulse 87   Temp 97.9 F (36.6 C) (Temporal)   Resp 17   Ht 5\' 7"  (1.702 m)   Wt 232 lb 6.4 oz (105.4 kg)   SpO2 99%   BMI 36.40 kg/m   Wt Readings from Last 3 Encounters:  10/18/20 239 lb (108.4 kg)  09/04/20 253 lb 8 oz (115 kg)  08/21/20 262 lb (118.8 kg)    General: Appears their stated age, well developed, well nourished in NAD. Skin: Warm, dry and intact. No rashes, lesions or ulcerations noted. HEENT: Head: normal shape and size; Eyes: sclera white, no icterus, conjunctiva pink, PERRLA and EOMs intact; Ears: Tm's gray and intact, normal light reflex; Nose: mucosa pink and moist, septum midline; Throat/Mouth: Teeth present, mucosa pink and moist, no exudate, lesions or ulcerations noted.  Neck:  Neck supple, trachea midline. No masses, lumps or thyromegaly present.  Cardiovascular: Normal rate and rhythm. S1,S2 noted.  No murmur, rubs or gallops noted. No JVD or BLE edema. No carotid bruits  noted. Pulmonary/Chest: Normal effort and positive vesicular breath sounds. No respiratory distress. No wheezes, rales or ronchi noted.  Abdomen: Soft and nontender. Normal bowel sounds. No distention or masses noted. Liver, spleen and kidneys non palpable. Musculoskeletal: Normal range of motion. No signs of joint swelling. No difficulty with gait.  Neurological: Alert and oriented. Cranial nerves II-XII grossly intact. Coordination normal.  Psychiatric: Mood and affect normal. Behavior is normal. Judgment and thought content normal.     BMET    Component Value Date/Time   NA 142 08/31/2019 1005   K 4.7 08/31/2019 1005   CL 108 (H) 08/31/2019 1005   CO2 24 08/31/2019 1005   GLUCOSE 100 (H) 08/31/2019 1005   GLUCOSE 93 11/13/2015 1501   BUN 17 08/31/2019 1005   CREATININE 1.12 (H) 08/31/2019 1005   CALCIUM 9.1 08/31/2019 1005   GFRNONAA 56 (L) 08/31/2019 1005   GFRAA 64 08/31/2019 1005    Lipid Panel     Component Value Date/Time   CHOL 174 08/31/2019 1005   TRIG 151 (H) 08/31/2019 1005   HDL 55 08/31/2019 1005   CHOLHDL 3.2 08/31/2019 1005   CHOLHDL 3 11/13/2015 1501   VLDL 29.8 11/13/2015 1501   LDLCALC 93 08/31/2019 1005    CBC    Component Value Date/Time   WBC 6.7 08/31/2019 1005   WBC 8.6 11/13/2015 1501   RBC 4.23 08/31/2019 1005   RBC 4.49 11/13/2015 1501   HGB 13.7 08/31/2019 1005   HCT 39.9 08/31/2019 1005   PLT 233 08/31/2019 1005   MCV 94 08/31/2019 1005   MCH 32.4 08/31/2019 1005   MCHC 34.3 08/31/2019 1005   MCHC 34.0 11/13/2015 1501   RDW 12.5 08/31/2019 1005   LYMPHSABS 1.4 10/16/2017 1000   EOSABS 0.2 10/16/2017 1000   BASOSABS 0.0 10/16/2017 1000    Hgb A1C Lab Results  Component Value Date   HGBA1C 4.9 08/31/2019  Assessment & Plan:    Nicki Reaper, NP This visit occurred during the SARS-CoV-2 public health emergency.  Safety protocols were in place, including screening questions prior to the visit, additional usage  of staff PPE, and extensive cleaning of exam room while observing appropriate contact time as indicated for disinfecting solutions.

## 2020-12-03 ENCOUNTER — Other Ambulatory Visit: Payer: Self-pay | Admitting: Family

## 2020-12-06 DIAGNOSIS — N183 Chronic kidney disease, stage 3 unspecified: Secondary | ICD-10-CM | POA: Insufficient documentation

## 2020-12-06 NOTE — Assessment & Plan Note (Signed)
Not medicated and not following with rheumatology Will monitor

## 2020-12-06 NOTE — Assessment & Plan Note (Signed)
Encourage weight loss as this can help reduce sleep apnea symptoms Continue CPAP 

## 2020-12-06 NOTE — Assessment & Plan Note (Signed)
Diet controlled.  

## 2020-12-06 NOTE — Assessment & Plan Note (Signed)
Encourage diet and exercise weight loss 

## 2020-12-06 NOTE — Assessment & Plan Note (Signed)
Continue Advair, Singulair and albuterol Will monitor

## 2020-12-06 NOTE — Assessment & Plan Note (Signed)
Currently not an issue Started on Vyvanse She will continue to follow with neurology

## 2020-12-06 NOTE — Assessment & Plan Note (Signed)
She will bring me a copy of her recent labs Continue losartan for renal protection

## 2020-12-06 NOTE — Assessment & Plan Note (Signed)
Continue Excedrin as needed 

## 2020-12-06 NOTE — Assessment & Plan Note (Signed)
Continue trazodone 

## 2020-12-06 NOTE — Assessment & Plan Note (Signed)
Encouraged weight loss as this can help reduce reflux symptoms Off meds, will monitor

## 2020-12-06 NOTE — Assessment & Plan Note (Signed)
Controlled on losartan She will send me a copy of her recent kidney function Encouraged DASH diet and exercise weight loss

## 2020-12-06 NOTE — Assessment & Plan Note (Signed)
Stable on sertraline and Wellbutrin Support offered

## 2020-12-06 NOTE — Patient Instructions (Signed)
Health Maintenance, Female Adopting a healthy lifestyle and getting preventive care are important in promoting health and wellness. Ask your health care provider about: The right schedule for you to have regular tests and exams. Things you can do on your own to prevent diseases and keep yourself healthy. What should I know about diet, weight, and exercise? Eat a healthy diet  Eat a diet that includes plenty of vegetables, fruits, low-fat dairy products, and lean protein. Do not eat a lot of foods that are high in solid fats, added sugars, or sodium.  Maintain a healthy weight Body mass index (BMI) is used to identify weight problems. It estimates body fat based on height and weight. Your health care provider can help determineyour BMI and help you achieve or maintain a healthy weight. Get regular exercise Get regular exercise. This is one of the most important things you can do for your health. Most adults should: Exercise for at least 150 minutes each week. The exercise should increase your heart rate and make you sweat (moderate-intensity exercise). Do strengthening exercises at least twice a week. This is in addition to the moderate-intensity exercise. Spend less time sitting. Even light physical activity can be beneficial. Watch cholesterol and blood lipids Have your blood tested for lipids and cholesterol at 56 years of age, then havethis test every 5 years. Have your cholesterol levels checked more often if: Your lipid or cholesterol levels are high. You are older than 56 years of age. You are at high risk for heart disease. What should I know about cancer screening? Depending on your health history and family history, you may need to have cancer screening at various ages. This may include screening for: Breast cancer. Cervical cancer. Colorectal cancer. Skin cancer. Lung cancer. What should I know about heart disease, diabetes, and high blood pressure? Blood pressure and heart  disease High blood pressure causes heart disease and increases the risk of stroke. This is more likely to develop in people who have high blood pressure readings, are of African descent, or are overweight. Have your blood pressure checked: Every 3-5 years if you are 18-39 years of age. Every year if you are 40 years old or older. Diabetes Have regular diabetes screenings. This checks your fasting blood sugar level. Have the screening done: Once every three years after age 40 if you are at a normal weight and have a low risk for diabetes. More often and at a younger age if you are overweight or have a high risk for diabetes. What should I know about preventing infection? Hepatitis B If you have a higher risk for hepatitis B, you should be screened for this virus. Talk with your health care provider to find out if you are at risk forhepatitis B infection. Hepatitis C Testing is recommended for: Everyone born from 1945 through 1965. Anyone with known risk factors for hepatitis C. Sexually transmitted infections (STIs) Get screened for STIs, including gonorrhea and chlamydia, if: You are sexually active and are younger than 56 years of age. You are older than 56 years of age and your health care provider tells you that you are at risk for this type of infection. Your sexual activity has changed since you were last screened, and you are at increased risk for chlamydia or gonorrhea. Ask your health care provider if you are at risk. Ask your health care provider about whether you are at high risk for HIV. Your health care provider may recommend a prescription medicine to help   prevent HIV infection. If you choose to take medicine to prevent HIV, you should first get tested for HIV. You should then be tested every 3 months for as long as you are taking the medicine. Pregnancy If you are about to stop having your period (premenopausal) and you may become pregnant, seek counseling before you get  pregnant. Take 400 to 800 micrograms (mcg) of folic acid every day if you become pregnant. Ask for birth control (contraception) if you want to prevent pregnancy. Osteoporosis and menopause Osteoporosis is a disease in which the bones lose minerals and strength with aging. This can result in bone fractures. If you are 65 years old or older, or if you are at risk for osteoporosis and fractures, ask your health care provider if you should: Be screened for bone loss. Take a calcium or vitamin D supplement to lower your risk of fractures. Be given hormone replacement therapy (HRT) to treat symptoms of menopause. Follow these instructions at home: Lifestyle Do not use any products that contain nicotine or tobacco, such as cigarettes, e-cigarettes, and chewing tobacco. If you need help quitting, ask your health care provider. Do not use street drugs. Do not share needles. Ask your health care provider for help if you need support or information about quitting drugs. Alcohol use Do not drink alcohol if: Your health care provider tells you not to drink. You are pregnant, may be pregnant, or are planning to become pregnant. If you drink alcohol: Limit how much you use to 0-1 drink a day. Limit intake if you are breastfeeding. Be aware of how much alcohol is in your drink. In the [place] one drink equals one 12 oz bottle of beer (355 mL), one 5 oz glass of wine (148 mL), or one 1 oz glass of hard liquor (44 mL). General instructions Schedule regular health, dental, and eye exams. Stay current with your vaccines. Tell your health care provider if: You often feel depressed. You have ever been abused or do not feel safe at home. Summary Adopting a healthy lifestyle and getting preventive care are important in promoting health and wellness. Follow your health care provider's instructions about healthy diet, exercising, and getting tested or screened for diseases. Follow your health care provider's  instructions on monitoring your cholesterol and blood pressure. This information is not intended to replace advice given to you by your health care provider. Make sure you discuss any questions you have with your healthcare provider. Document Revised: 04/29/2018 Document Reviewed: 04/29/2018 Elsevier Patient Education  2022 [employer]  

## 2020-12-06 NOTE — Assessment & Plan Note (Signed)
She reports she recently had labs done at work, will send these to me Encouraged her to consume a low-fat diet Continue simvastatin

## 2020-12-11 ENCOUNTER — Encounter: Payer: Self-pay | Admitting: Neurology

## 2020-12-11 ENCOUNTER — Other Ambulatory Visit: Payer: Self-pay | Admitting: *Deleted

## 2020-12-11 ENCOUNTER — Encounter: Payer: Self-pay | Admitting: Internal Medicine

## 2020-12-11 MED ORDER — LACOSAMIDE 150 MG PO TABS: 150.0000 mg | ORAL_TABLET | Freq: Two times a day (BID) | ORAL | 1 refills | Status: DC

## 2020-12-11 NOTE — Telephone Encounter (Signed)
Could you abstract these labs?

## 2020-12-12 ENCOUNTER — Encounter: Payer: Self-pay | Admitting: *Deleted

## 2020-12-12 ENCOUNTER — Telehealth: Payer: Self-pay | Admitting: Neurology

## 2020-12-12 DIAGNOSIS — R700 Elevated erythrocyte sedimentation rate: Secondary | ICD-10-CM | POA: Insufficient documentation

## 2020-12-12 NOTE — Telephone Encounter (Signed)
Message received to call pt to schedule an appointment, phone rep called pt to schedule her requested 6 mo f/u. Vm left with our # for pt to call back to make appointment.  This is FYI, no call needed from POD

## 2020-12-22 ENCOUNTER — Other Ambulatory Visit: Payer: Self-pay

## 2020-12-22 DIAGNOSIS — I10 Essential (primary) hypertension: Secondary | ICD-10-CM

## 2020-12-22 MED ORDER — LOSARTAN POTASSIUM 25 MG PO TABS: ORAL_TABLET | ORAL | 0 refills | Status: DC

## 2021-01-25 ENCOUNTER — Other Ambulatory Visit: Payer: Self-pay | Admitting: Family

## 2021-04-21 NOTE — Progress Notes (Signed)
Chart reviewed, agree above plan ?

## 2021-06-15 ENCOUNTER — Other Ambulatory Visit: Payer: Self-pay | Admitting: Neurology

## 2021-06-18 NOTE — Telephone Encounter (Signed)
Verify Drug Registry For Lacosamide 150 Mg Tablet Last Filled: 04/03/2021 Quantity: 60 tablets for 30 days Last appointment: 07/13/2020 Next appointment: no upcoming appointment on file

## 2021-07-07 ENCOUNTER — Other Ambulatory Visit: Payer: Self-pay | Admitting: Internal Medicine

## 2021-07-07 DIAGNOSIS — I10 Essential (primary) hypertension: Secondary | ICD-10-CM

## 2021-07-09 NOTE — Telephone Encounter (Signed)
Requested medication (s) are due for refill today: yes  Requested medication (s) are on the active medication list: yes  Last refill:  12/22/20 #90 0 refills  Future visit scheduled: no overdue x 1  month  Notes to clinic:  do you want to give courtesy refill? Last labs 7//14/22. Called patient to schedule appt. For future refills. No answer [facility]MT[facility].      Requested Prescriptions  Pending Prescriptions Disp Refills   losartan (COZAAR) 25 MG tablet [Pharmacy Med Name: LOSARTAN 25MG  TABLETS] 90 tablet 0    Sig: TAKE 1 TABLET(25 MG) BY MOUTH DAILY     Cardiovascular:  Angiotensin Receptor Blockers Failed - 07/07/2021 10:36 PM      Failed - Cr in normal range and within 180 days    Creatinine  Date Value Ref Range Status  11/30/2020 0.8 0.5 - 1.1 Final   Creatinine, Ser  Date Value Ref Range Status  08/31/2019 1.12 (H) 0.57 - 1.00 mg/dL Final          Failed - K in normal range and within 180 days    Potassium  Date Value Ref Range Status  08/31/2019 4.7 3.5 - 5.2 mmol/L Final          Failed - Valid encounter within last 6 months    Recent Outpatient Visits           7 months ago Encounter for general adult medical examination with abnormal findings   Scofield, NP              Passed - Patient is not pregnant      Passed - Last BP in normal range    BP Readings from Last 1 Encounters:  12/01/20 119/60

## 2021-07-09 NOTE — Telephone Encounter (Signed)
Called patient to schedule appt for medication refills. No answer, [facility]MTCB #[phone]. 

## 2021-07-17 ENCOUNTER — Ambulatory Visit: Payer: Managed Care, Other (non HMO) | Admitting: Internal Medicine

## 2021-07-17 ENCOUNTER — Encounter: Payer: Self-pay | Admitting: Internal Medicine

## 2021-07-17 ENCOUNTER — Other Ambulatory Visit: Payer: Self-pay

## 2021-07-17 DIAGNOSIS — E66812 Obesity, class 2: Secondary | ICD-10-CM

## 2021-07-17 DIAGNOSIS — G44229 Chronic tension-type headache, not intractable: Secondary | ICD-10-CM

## 2021-07-17 DIAGNOSIS — G4733 Obstructive sleep apnea (adult) (pediatric): Secondary | ICD-10-CM | POA: Diagnosis not present

## 2021-07-17 DIAGNOSIS — J4540 Moderate persistent asthma, uncomplicated: Secondary | ICD-10-CM

## 2021-07-17 DIAGNOSIS — F5104 Psychophysiologic insomnia: Secondary | ICD-10-CM

## 2021-07-17 DIAGNOSIS — K219 Gastro-esophageal reflux disease without esophagitis: Secondary | ICD-10-CM

## 2021-07-17 DIAGNOSIS — I10 Essential (primary) hypertension: Secondary | ICD-10-CM | POA: Diagnosis not present

## 2021-07-17 DIAGNOSIS — K582 Mixed irritable bowel syndrome: Secondary | ICD-10-CM

## 2021-07-17 DIAGNOSIS — J3089 Other allergic rhinitis: Secondary | ICD-10-CM

## 2021-07-17 DIAGNOSIS — M069 Rheumatoid arthritis, unspecified: Secondary | ICD-10-CM

## 2021-07-17 DIAGNOSIS — F32A Depression, unspecified: Secondary | ICD-10-CM

## 2021-07-17 DIAGNOSIS — F419 Anxiety disorder, unspecified: Secondary | ICD-10-CM

## 2021-07-17 DIAGNOSIS — G40A09 Absence epileptic syndrome, not intractable, without status epilepticus: Secondary | ICD-10-CM

## 2021-07-17 DIAGNOSIS — Z6836 Body mass index (BMI) 36.0-36.9, adult: Secondary | ICD-10-CM

## 2021-07-17 DIAGNOSIS — N1831 Chronic kidney disease, stage 3a: Secondary | ICD-10-CM

## 2021-07-17 DIAGNOSIS — E782 Mixed hyperlipidemia: Secondary | ICD-10-CM

## 2021-07-17 MED ORDER — NIACIN ER (ANTIHYPERLIPIDEMIC) 1000 MG PO TBCR: 1000.0000 mg | EXTENDED_RELEASE_TABLET | Freq: Every day | ORAL | 1 refills | Status: DC

## 2021-07-17 MED ORDER — LOSARTAN POTASSIUM 25 MG PO TABS: ORAL_TABLET | ORAL | 1 refills | Status: DC

## 2021-07-17 MED ORDER — ALBUTEROL SULFATE HFA 108 (90 BASE) MCG/ACT IN AERS: 1.0000 | INHALATION_SPRAY | Freq: Four times a day (QID) | RESPIRATORY_TRACT | 2 refills | Status: DC | PRN

## 2021-07-17 MED ORDER — FLUTICASONE-SALMETEROL 100-50 MCG/ACT IN AEPB: INHALATION_SPRAY | RESPIRATORY_TRACT | 1 refills | Status: DC

## 2021-07-17 NOTE — Assessment & Plan Note (Signed)
Diet controlled.  

## 2021-07-17 NOTE — Assessment & Plan Note (Signed)
Will check kidney function at annual exam

## 2021-07-17 NOTE — Assessment & Plan Note (Signed)
Managed on Vimpat She will continue to follow with neurology

## 2021-07-17 NOTE — Assessment & Plan Note (Signed)
Continue Advair, Singulair and Albuterol

## 2021-07-17 NOTE — Assessment & Plan Note (Signed)
Stable on her current dose of Vyvanse, Sertraline and Wellbutrin She will continue to meet with her therapist and psychiatrist Support offered

## 2021-07-17 NOTE — Assessment & Plan Note (Signed)
Encouraged her to consume a low fat diet and exercise for weight loss Continue Simvastatin

## 2021-07-17 NOTE — Assessment & Plan Note (Signed)
Encouraged weight loss as this can help reduce reflux symptoms Ok to take Tums OTC as needed

## 2021-07-17 NOTE — Assessment & Plan Note (Signed)
Encouraged weight loss as this can help reduce sleep apnea symptoms Continue CPAP 

## 2021-07-17 NOTE — Assessment & Plan Note (Signed)
Encouraged diet and exercise for weight loss ?

## 2021-07-17 NOTE — Patient Instructions (Signed)
Heart-Healthy Eating Plan Heart-healthy meal planning includes: Eating less unhealthy fats. Eating more healthy fats. Making other changes in your diet. Talk with your doctor or a diet specialist (dietitian) to create an eating plan that is right for you. What is my plan? Your doctor may recommend an eating plan that includes: Total fat: ______% or less of total calories a day. Saturated fat: ______% or less of total calories a day. Cholesterol: less than _________mg a day. What are tips for following this plan? Cooking Avoid frying your food. Try to bake, boil, grill, or broil it instead. You can also reduce fat by: Removing the skin from poultry. Removing all visible fats from meats. Steaming vegetables in water or broth. Meal planning  At meals, divide your plate into four equal parts: Fill one-half of your plate with vegetables and green salads. Fill one-fourth of your plate with whole grains. Fill one-fourth of your plate with lean protein foods. Eat 4-5 servings of vegetables per day. A serving of vegetables is: 1 cup of raw or cooked vegetables. 2 cups of raw leafy greens. Eat 4-5 servings of fruit per day. A serving of fruit is: 1 medium whole fruit.  cup of dried fruit.  cup of fresh, frozen, or canned fruit.  cup of 100% fruit juice. Eat more foods that have soluble fiber. These are apples, broccoli, carrots, beans, peas, and barley. Try to get 20-30 g of fiber per day. Eat 4-5 servings of nuts, legumes, and seeds per week: 1 serving of dried beans or legumes equals  cup after being cooked. 1 serving of nuts is  cup. 1 serving of seeds equals 1 tablespoon. General information Eat more home-cooked food. Eat less restaurant, buffet, and fast food. Limit or avoid alcohol. Limit foods that are high in starch and sugar. Avoid fried foods. Lose weight if you are overweight. Keep track of how much salt (sodium) you eat. This is important if you have high blood  pressure. Ask your doctor to tell you more about this. Try to add vegetarian meals each week. Fats Choose healthy fats. These include olive oil and canola oil, flaxseeds, walnuts, almonds, and seeds. Eat more omega-3 fats. These include salmon, mackerel, sardines, tuna, flaxseed oil, and ground flaxseeds. Try to eat fish at least 2 times each week. Check food labels. Avoid foods with trans fats or high amounts of saturated fat. Limit saturated fats. These are often found in animal products, such as meats, butter, and cream. These are also found in plant foods, such as palm oil, palm kernel oil, and coconut oil. Avoid foods with partially hydrogenated oils in them. These have trans fats. Examples are stick margarine, some tub margarines, cookies, crackers, and other baked goods. What foods can I eat? Fruits All fresh, canned (in natural juice), or frozen fruits. Vegetables Fresh or frozen vegetables (raw, steamed, roasted, or grilled). Green salads. Grains Most grains. Choose whole wheat and whole grains most of the time. Rice and pasta, including brown rice and pastas made with whole wheat. Meats and other proteins Lean, well-trimmed beef, veal, pork, and lamb. Chicken and turkey without skin. All fish and shellfish. Wild duck, rabbit, pheasant, and venison. Egg whites or low-cholesterol egg substitutes. Dried beans, peas, lentils, and tofu. Seeds and most nuts. Dairy Low-fat or nonfat cheeses, including ricotta and mozzarella. Skim or 1% milk that is liquid, powdered, or evaporated. Buttermilk that is made with low-fat milk. Nonfat or low-fat yogurt. Fats and oils Non-hydrogenated (trans-free) margarines. Vegetable oils, including   soybean, sesame, sunflower, olive, peanut, safflower, corn, canola, and cottonseed. Salad dressings or mayonnaise made with a vegetable oil. Beverages Mineral water. Coffee and tea. Diet carbonated beverages. Sweets and desserts Sherbet, gelatin, and fruit ice.  Small amounts of dark chocolate. Limit all sweets and desserts. Seasonings and condiments All seasonings and condiments. The items listed above may not be a complete list of foods and drinks you can eat. Contact a dietitian for more options. What foods should I avoid? Fruits Canned fruit in heavy syrup. Fruit in cream or butter sauce. Fried fruit. Limit coconut. Vegetables Vegetables cooked in cheese, cream, or butter sauce. Fried vegetables. Grains Breads that are made with saturated or trans fats, oils, or whole milk. Croissants. Sweet rolls. Donuts. High-fat crackers, such as cheese crackers. Meats and other proteins Fatty meats, such as hot dogs, ribs, sausage, bacon, rib-eye roast or steak. High-fat deli meats, such as salami and bologna. Caviar. Domestic duck and goose. Organ meats, such as liver. Dairy Cream, sour cream, cream cheese, and creamed cottage cheese. Whole-milk cheeses. Whole or 2% milk that is liquid, evaporated, or condensed. Whole buttermilk. Cream sauce or high-fat cheese sauce. Yogurt that is made from whole milk. Fats and oils Meat fat, or shortening. Cocoa butter, hydrogenated oils, palm oil, coconut oil, palm kernel oil. Solid fats and shortenings, including bacon fat, salt pork, lard, and butter. Nondairy cream substitutes. Salad dressings with cheese or sour cream. Beverages Regular sodas and juice drinks with added sugar. Sweets and desserts Frosting. Pudding. Cookies. Cakes. Pies. Milk chocolate or white chocolate. Buttered syrups. Full-fat ice cream or ice cream drinks. The items listed above may not be a complete list of foods and drinks to avoid. Contact a dietitian for more information. Summary Heart-healthy meal planning includes eating less unhealthy fats, eating more healthy fats, and making other changes in your diet. Eat a balanced diet. This includes fruits and vegetables, low-fat or nonfat dairy, lean protein, nuts and legumes, whole grains, and  heart-healthy oils and fats. This information is not intended to replace advice given to you by your health care provider. Make sure you discuss any questions you have with your health care provider. Document Revised: 09/14/2020 Document Reviewed: 09/14/2020 Elsevier Patient Education  2022 [employer]  

## 2021-07-17 NOTE — Assessment & Plan Note (Signed)
Continue Excedrin Migraine She will continue to follow with neurology

## 2021-07-17 NOTE — Progress Notes (Signed)
Subjective:    Patient ID: Donna Vasquez, female    DOB: 1966/02/17, 56 y.o.   MRN: [id number]  HPI  Patient presents to clinic today for 46-month follow-up of chronic conditions.  Allergy Induced Asthma: Moderate, persistent.  She is taking Advair, Singulair and Albuterol as prescribed.  There are no PFTs on file.  She follows with allergy and asthma.  OSA: She averages 8 hours of sleep per night with the use of her CPAP.  Sleep study from 03/2018 reviewed.  Anxiety and Depression: Chronic dysthymia, managed on Vyvanse, Sertraline and Wellbutrin.  She is currently seeing a therapist.  She denies SI/HI.  Frequent Headaches: These occur at least 2 times per week.  Triggered by stress.  She takes Excedrin Migraine as needed with good relief of symptoms.  She follows with neurology.  IBS: Alternating constipation and diarrhea.  She controls this with diet.  She does not follow with GI.  RA: Juvenile, mainly in her knees and back.  She is not taking any medications for this at this time.  She does not follow with rheumatology.  GERD: Triggered by stress.  She is not taking any medications for this at this time.  There is no upper GI on file.  Insomnia: She has difficulty staying asleep.  She takes Trazodone nightly with good relief of symptoms.  Sleep study from 03/2018 reviewed.  HLD: Her last LDL was 65, triglycerides 99, 11/2020.  She denies myalgias on Simvastatin and Niacin.  She tries to consume a low-fat diet.  HTN: Her BP today is 131/68.  She is taking Losartan as prescribed.  ECG from 11/2017 reviewed.  Absent Seizures: She denies recent seizure.  She is taking Vimpat as prescribed.  She follows with neurology.  CKD 3: Her last creatinine was 0.8, GFR >60.  She is on Losartan for renal protection.  She does not follow with nephrology.  Review of Systems     Past Medical History:  Diagnosis Date   Allergy    Asthma    Back pain    Bulging lumbar disc    Chicken pox     Constipation    Depression    Fatigue    Frequent headaches    Gall bladder disease    Headache    Heartburn    Hyperlipidemia    IBS (irritable bowel syndrome)    Interstitial cystitis    RA (rheumatoid arthritis) (HCC)    Shortness of breath on exertion    Staring episodes    Trouble in sleeping    Urine incontinence    Vertigo    Vision changes    Vitamin D deficiency     Current Outpatient Medications  Medication Sig Dispense Refill   ADVAIR DISKUS 100-50 MCG/ACT AEPB USE 1 INHALATION BY MOUTH  TWICE DAILY 180 each 3   albuterol (PROAIR HFA) 108 (90 Base) MCG/ACT inhaler Inhale 1-2 puffs into the lungs every 6 (six) hours as needed for wheezing or shortness of breath. 8.7 g 2   aspirin 81 MG tablet Take 81 mg by mouth daily.     Lacosamide 150 MG TABS TAKE 1 TABLET(150 MG) BY MOUTH IN THE MORNING AND AT BEDTIME 180 tablet 0   losartan (COZAAR) 25 MG tablet TAKE 1 TABLET(25 MG) BY MOUTH DAILY 90 tablet 0   montelukast (SINGULAIR) 10 MG tablet TAKE 1 TABLET BY MOUTH AT  BEDTIME 90 tablet 3   niacin (NIASPAN) 1000 MG CR tablet TAKE 1 TABLET  BY MOUTH AT  BEDTIME 90 tablet 0   sertraline (ZOLOFT) 100 MG tablet TAKE 1 TABLET BY MOUTH  DAILY 90 tablet 3   simvastatin (ZOCOR) 20 MG tablet TAKE 1 TABLET BY MOUTH  DAILY AT 6PM 90 tablet 0   traZODone (DESYREL) 50 MG tablet TAKE 1/2 TO 1 TABLET BY  MOUTH AT BEDTIME AS NEEDED  FOR SLEEP 90 tablet 0   VYVANSE 50 MG capsule Take 60 mg by mouth every morning.     No current facility-administered medications for this visit.    Allergies  Allergen Reactions   Flonase [Fluticasone Propionate] Other (See Comments)    Nasal irritation, burning, but can tolerate advair inhaler.     Sulfa Antibiotics Hives    Family History  Problem Relation Age of Onset   COPD Mother    Heart disease Mother    Polymyalgia rheumatica Mother    Emphysema Mother    Stroke Mother    Depression Mother    Anxiety disorder Mother    Heart disease  Father    Hypertension Father    Hyperlipidemia Father    Anxiety disorder Father    Cancer Paternal Aunt        Breast   Breast cancer Paternal Aunt 44   Stroke Maternal Grandmother    Cancer Maternal Aunt    Other Brother        melas syndrom   Parkinson's disease Maternal Grandfather     Social History   Socioeconomic History   Marital status: Married    Spouse name: Linna Hoff   Number of children: 2   Years of education: college   Highest education level: Bachelor's degree (e.g., BA, AB, BS)  Occupational History   Occupation: Patent attorney  Tobacco Use   Smoking status: Never   Smokeless tobacco: Never  Vaping Use   Vaping Use: Never used  Substance and Sexual Activity   Alcohol use: Not Currently   Drug use: No   Sexual activity: Not on file  Other Topics Concern   Not on file  Social History Narrative   Lives at home with family.   Right-handed.   Caffeine use: Diet Pepsi - 8 ounces to 2 liters per day - varies.   Social Determinants of Health   Financial Resource Strain: Not on file  Food Insecurity: Not on file  Transportation Needs: Not on file  Physical Activity: Not on file  Stress: Not on file  Social Connections: Not on file  Intimate Partner Violence: Not on file     Constitutional: Pt reports chronic fatigue. Denies fever, malaise, headache or abrupt weight changes.  HEENT: Denies eye pain, eye redness, ear pain, ringing in the ears, wax buildup, runny nose, nasal congestion, bloody nose, or sore throat. Respiratory: Denies difficulty breathing, shortness of breath, cough or sputum production.   Cardiovascular: Denies chest pain, chest tightness, palpitations or swelling in the hands or feet.  Gastrointestinal: Patient reports intermittent reflux, constipation and diarrhea.  Denies abdominal pain, bloating, or blood in the stool.  GU: Denies urgency, frequency, pain with urination, burning sensation, blood in urine, odor or  discharge. Musculoskeletal: Denies decrease in range of motion, difficulty with gait, muscle pain or joint pain and swelling.  Skin: Denies redness, rashes, lesions or ulcercations.  Neurological: Patient reports insomnia, difficulty with concentration and memory.  Denies dizziness, difficulty with speech or problems with balance and coordination.  Psych: Patient has a history of anxiety and depression.  Denies SI/HI.  No other specific complaints in a complete review of systems (except as listed in HPI above).  Objective:   Physical Exam  BP 131/68 (BP Location: Right Arm, Patient Position: Sitting, Cuff Size: Large)    Pulse 72    Temp (!) 97.1 F (36.2 C) (Temporal)    Wt 236 lb (107 kg)    SpO2 99%    BMI 36.96 kg/m   Wt Readings from Last 3 Encounters:  12/01/20 232 lb 6.4 oz (105.4 kg)  10/18/20 239 lb (108.4 kg)  09/04/20 253 lb 8 oz (115 kg)    General: Appears her stated age, obese, in NAD. Skin: Warm, dry and intact. HEENT: Head: normal shape and size; Eyes: sclera white and EOMs intact;  Cardiovascular: Normal rate and rhythm. S1,S2 noted.  No murmur, rubs or gallops noted. No JVD or BLE edema. Pulmonary/Chest: Normal effort and positive vesicular breath sounds. No respiratory distress. No wheezes, rales or ronchi noted.  Abdomen: Soft and nontender. Normal bowel sounds.  Musculoskeletal: Strength 5/5 BUE/BLE. No difficulty with gait.  Neurological: Alert and oriented.  Psychiatric: Mood and affect mildly flat. Behavior is normal. Judgment and thought content normal.   BMET    Component Value Date/Time   NA 142 08/31/2019 1005   K 4.7 08/31/2019 1005   CL 108 (H) 08/31/2019 1005   CO2 24 08/31/2019 1005   GLUCOSE 100 (H) 08/31/2019 1005   GLUCOSE 93 11/13/2015 1501   BUN 17 08/31/2019 1005   CREATININE 0.8 11/30/2020 0000   CREATININE 1.12 (H) 08/31/2019 1005   CALCIUM 9.1 08/31/2019 1005   GFRNONAA 89 11/30/2020 0000   GFRAA 64 08/31/2019 1005    Lipid  Panel     Component Value Date/Time   CHOL 139 11/30/2020 0000   CHOL 174 08/31/2019 1005   TRIG 99 11/30/2020 0000   HDL 56 11/30/2020 0000   HDL 55 08/31/2019 1005   CHOLHDL 3.2 08/31/2019 1005   CHOLHDL 3 11/13/2015 1501   VLDL 29.8 11/13/2015 1501   LDLCALC 65 11/30/2020 0000   LDLCALC 93 08/31/2019 1005    CBC    Component Value Date/Time   WBC 6.7 08/31/2019 1005   WBC 8.6 11/13/2015 1501   RBC 4.23 08/31/2019 1005   RBC 4.49 11/13/2015 1501   HGB 13.7 08/31/2019 1005   HCT 39.9 08/31/2019 1005   PLT 233 08/31/2019 1005   MCV 94 08/31/2019 1005   MCH 32.4 08/31/2019 1005   MCHC 34.3 08/31/2019 1005   MCHC 34.0 11/13/2015 1501   RDW 12.5 08/31/2019 1005   LYMPHSABS 1.4 10/16/2017 1000   EOSABS 0.2 10/16/2017 1000   BASOSABS 0.0 10/16/2017 1000    Hgb A1C Lab Results  Component Value Date   HGBA1C 5.3 11/30/2020            Assessment & Plan:   Webb Silversmith, NP This visit occurred during the SARS-CoV-2 public health emergency.  Safety protocols were in place, including screening questions prior to the visit, additional usage of staff PPE, and extensive cleaning of exam room while observing appropriate contact time as indicated for disinfecting solutions.

## 2021-07-17 NOTE — Assessment & Plan Note (Signed)
Sleeping well on Trazadone Will monitior

## 2021-07-17 NOTE — Assessment & Plan Note (Signed)
Controlled on Losartan Reinforced DASH diet and exercise for weight loss 

## 2021-07-17 NOTE — Assessment & Plan Note (Signed)
Not medicated Will monitor 

## 2021-09-11 ENCOUNTER — Encounter: Payer: Self-pay | Admitting: Internal Medicine

## 2021-10-22 ENCOUNTER — Other Ambulatory Visit: Payer: Self-pay | Admitting: Neurology

## 2021-10-22 NOTE — Telephone Encounter (Signed)
Rx denied for patient. Note added to pharmacy for patient to schedule an appointment.

## 2021-11-05 ENCOUNTER — Other Ambulatory Visit: Payer: Self-pay | Admitting: Family

## 2021-11-26 ENCOUNTER — Telehealth: Payer: Self-pay | Admitting: Internal Medicine

## 2021-11-26 NOTE — Telephone Encounter (Signed)
Medication Refill - Medication:   simvastatin (ZOCOR) 20 MG tablet  Has the patient contacted their pharmacy? Yes.  No refills on file.  (Agent: If no, request that the patient contact the pharmacy for the refill. If patient does not wish to contact the pharmacy document the reason why and proceed with request.) (Agent: If yes, when and what did the pharmacy advise?)  Preferred Pharmacy (with phone number or street name):   [id number] [place], [place] - [address] AT [facility] OF [address] [facility]  [address] [place] [address]  Phone: [phone] Fax: [phone]    Has the patient been seen for an appointment in the last year OR does the patient have an upcoming appointment? Yes.    Agent: Please be advised that RX refills may take up to 3 business days. We ask that you follow-up with your pharmacy.

## 2021-11-27 NOTE — Telephone Encounter (Signed)
Requested medication (s) are due for refill today: yes  Requested medication (s) are on the active medication list: yes    Last refill: 11/03/20  #90  0 refills  Future visit scheduled no  Notes to clinic:Historical provider, please review.  Requested Prescriptions  Pending Prescriptions Disp Refills   simvastatin (ZOCOR) 20 MG tablet 90 tablet 0     Cardiovascular:  Antilipid - Statins Failed - 11/26/2021 12:01 PM      Failed - Lipid Panel in normal range within the last 12 months    Cholesterol, Total  Date Value Ref Range Status  08/31/2019 174 100 - 199 mg/dL Final   Cholesterol  Date Value Ref Range Status  11/30/2020 139 0 - 200 Final   LDL Chol Calc (NIH)  Date Value Ref Range Status  08/31/2019 93 0 - 99 mg/dL Final   LDL Cholesterol  Date Value Ref Range Status  11/30/2020 65  Final   HDL  Date Value Ref Range Status  11/30/2020 56 35 - 70 Final  08/31/2019 55 >39 mg/dL Final   Triglycerides  Date Value Ref Range Status  11/30/2020 99 40 - 160 Final         Passed - Patient is not pregnant      Passed - Valid encounter within last 12 months    Recent Outpatient Visits           4 months ago Essential hypertension   [facility] [place], Salvadore Oxford, NP   12 months ago Encounter for general adult medical examination with abnormal findings   [facility] [place], Salvadore Oxford, NP

## 2021-11-28 ENCOUNTER — Other Ambulatory Visit: Payer: Self-pay

## 2021-11-28 MED ORDER — SIMVASTATIN 20 MG PO TABS: 20.0000 mg | ORAL_TABLET | Freq: Every evening | ORAL | 1 refills | Status: DC

## 2021-11-28 NOTE — Telephone Encounter (Signed)
Pt called and scheduled next available appt, she is completely out of her simvastatin and needs a short supply to last her until her appt. Please advise

## 2021-12-13 ENCOUNTER — Ambulatory Visit (INDEPENDENT_AMBULATORY_CARE_PROVIDER_SITE_OTHER): Payer: Managed Care, Other (non HMO) | Admitting: Internal Medicine

## 2021-12-13 ENCOUNTER — Encounter: Payer: Self-pay | Admitting: Internal Medicine

## 2021-12-13 VITALS — BP 136/86 | HR 76 | Temp 96.8°F | Ht 67.5 in | Wt 248.0 lb

## 2021-12-13 DIAGNOSIS — Z1231 Encounter for screening mammogram for malignant neoplasm of breast: Secondary | ICD-10-CM

## 2021-12-13 DIAGNOSIS — Z6838 Body mass index (BMI) 38.0-38.9, adult: Secondary | ICD-10-CM

## 2021-12-13 DIAGNOSIS — Z780 Asymptomatic menopausal state: Secondary | ICD-10-CM

## 2021-12-13 DIAGNOSIS — Z833 Family history of diabetes mellitus: Secondary | ICD-10-CM | POA: Diagnosis not present

## 2021-12-13 DIAGNOSIS — Z0001 Encounter for general adult medical examination with abnormal findings: Secondary | ICD-10-CM | POA: Diagnosis not present

## 2021-12-13 NOTE — Assessment & Plan Note (Signed)
Encourage diet and exercise for weight loss 

## 2021-12-13 NOTE — Patient Instructions (Signed)
Health Maintenance for Postmenopausal Women Menopause is a normal process in which your ability to get pregnant comes to an end. This process happens slowly over many months or years, usually between the ages of 48 and 55. Menopause is complete when you have missed your menstrual period for 12 months. It is important to talk with your health care provider about some of the most common conditions that affect women after menopause (postmenopausal women). These include heart disease, cancer, and bone loss (osteoporosis). Adopting a healthy lifestyle and getting preventive care can help to promote your health and wellness. The actions you take can also lower your chances of developing some of these common conditions. What are the signs and symptoms of menopause? During menopause, you may have the following symptoms: Hot flashes. These can be moderate or severe. Night sweats. Decrease in sex drive. Mood swings. Headaches. Tiredness (fatigue). Irritability. Memory problems. Problems falling asleep or staying asleep. Talk with your health care provider about treatment options for your symptoms. Do I need hormone replacement therapy? Hormone replacement therapy is effective in treating symptoms that are caused by menopause, such as hot flashes and night sweats. Hormone replacement carries certain risks, especially as you become older. If you are thinking about using estrogen or estrogen with progestin, discuss the benefits and risks with your health care provider. How can I reduce my risk for heart disease and stroke? The risk of heart disease, heart attack, and stroke increases as you age. One of the causes may be a change in the body's hormones during menopause. This can affect how your body uses dietary fats, triglycerides, and cholesterol. Heart attack and stroke are medical emergencies. There are many things that you can do to help prevent heart disease and stroke. Watch your blood pressure High  blood pressure causes heart disease and increases the risk of stroke. This is more likely to develop in people who have high blood pressure readings or are overweight. Have your blood pressure checked: Every 3-5 years if you are 18-39 years of age. Every year if you are 40 years old or older. Eat a healthy diet  Eat a diet that includes plenty of vegetables, fruits, low-fat dairy products, and lean protein. Do not eat a lot of foods that are high in solid fats, added sugars, or sodium. Get regular exercise Get regular exercise. This is one of the most important things you can do for your health. Most adults should: Try to exercise for at least 150 minutes each week. The exercise should increase your heart rate and make you sweat (moderate-intensity exercise). Try to do strengthening exercises at least twice each week. Do these in addition to the moderate-intensity exercise. Spend less time sitting. Even light physical activity can be beneficial. Other tips Work with your health care provider to achieve or maintain a healthy weight. Do not use any products that contain nicotine or tobacco. These products include cigarettes, chewing tobacco, and vaping devices, such as e-cigarettes. If you need help quitting, ask your health care provider. Know your numbers. Ask your health care provider to check your cholesterol and your blood sugar (glucose). Continue to have your blood tested as directed by your health care provider. Do I need screening for cancer? Depending on your health history and family history, you may need to have cancer screenings at different stages of your life. This may include screening for: Breast cancer. Cervical cancer. Lung cancer. Colorectal cancer. What is my risk for osteoporosis? After menopause, you may be   at increased risk for osteoporosis. Osteoporosis is a condition in which bone destruction happens more quickly than new bone creation. To help prevent osteoporosis or  the bone fractures that can happen because of osteoporosis, you may take the following actions: If you are 19-50 years old, get at least 1,000 mg of calcium and at least 600 international units (IU) of vitamin D per day. If you are older than age 50 but younger than age 70, get at least 1,200 mg of calcium and at least 600 international units (IU) of vitamin D per day. If you are older than age 70, get at least 1,200 mg of calcium and at least 800 international units (IU) of vitamin D per day. Smoking and drinking excessive alcohol increase the risk of osteoporosis. Eat foods that are rich in calcium and vitamin D, and do weight-bearing exercises several times each week as directed by your health care provider. How does menopause affect my mental health? Depression may occur at any age, but it is more common as you become older. Common symptoms of depression include: Feeling depressed. Changes in sleep patterns. Changes in appetite or eating patterns. Feeling an overall lack of motivation or enjoyment of activities that you previously enjoyed. Frequent crying spells. Talk with your health care provider if you think that you are experiencing any of these symptoms. General instructions See your health care provider for regular wellness exams and vaccines. This may include: Scheduling regular health, dental, and eye exams. Getting and maintaining your vaccines. These include: Influenza vaccine. Get this vaccine each year before the flu season begins. Pneumonia vaccine. Shingles vaccine. Tetanus, diphtheria, and pertussis (Tdap) booster vaccine. Your health care provider may also recommend other immunizations. Tell your health care provider if you have ever been abused or do not feel safe at home. Summary Menopause is a normal process in which your ability to get pregnant comes to an end. This condition causes hot flashes, night sweats, decreased interest in sex, mood swings, headaches, or lack  of sleep. Treatment for this condition may include hormone replacement therapy. Take actions to keep yourself healthy, including exercising regularly, eating a healthy diet, watching your weight, and checking your blood pressure and blood sugar levels. Get screened for cancer and depression. Make sure that you are up to date with all your vaccines. This information is not intended to replace advice given to you by your health care provider. Make sure you discuss any questions you have with your health care provider. Document Revised: 09/25/2020 Document Reviewed: 09/25/2020 Elsevier Patient Education  2023 [employer]  

## 2021-12-13 NOTE — Progress Notes (Signed)
Subjective:    Patient ID: Donna Vasquez, female    DOB: 04/06/1966, 56 y.o.   MRN: [id number]  HPI  Patient presents to clinic today for her annual exam.  Flu: 02/2019 Tetanus: 10/2020 COVID: Moderna x2 Pneumovax: 08/2019 Shingrix: never Pap smear: 09/2017 Mammogram: 09/2019 Bone density: Never Colon screening: colonoscopy scheduled Vision screening: annually Dentist: biannually  Diet: She does eat meat. She consumes fruits and veggies. She does eat some fried foods. She drinks mostly water, Dt. Coke. Exercise: None  Review of Systems     Past Medical History:  Diagnosis Date   Allergy    Asthma    Back pain    Bulging lumbar disc    Chicken pox    Constipation    Depression    Fatigue    Frequent headaches    Gall bladder disease    Headache    Heartburn    Hyperlipidemia    IBS (irritable bowel syndrome)    Interstitial cystitis    RA (rheumatoid arthritis) (HCC)    Shortness of breath on exertion    Staring episodes    Trouble in sleeping    Urine incontinence    Vertigo    Vision changes    Vitamin D deficiency     Current Outpatient Medications  Medication Sig Dispense Refill   albuterol (PROAIR HFA) 108 (90 Base) MCG/ACT inhaler Inhale 1-2 puffs into the lungs every 6 (six) hours as needed for wheezing or shortness of breath. 8.7 g 2   aspirin 81 MG tablet Take 81 mg by mouth daily.     fluticasone-salmeterol (ADVAIR DISKUS) 100-50 MCG/ACT AEPB USE 1 INHALATION BY MOUTH  TWICE DAILY 180 each 1   Lacosamide 150 MG TABS TAKE 1 TABLET(150 MG) BY MOUTH IN THE MORNING AND AT BEDTIME 180 tablet 0   losartan (COZAAR) 25 MG tablet TAKE 1 TABLET(25 MG) BY MOUTH DAILY 90 tablet 1   montelukast (SINGULAIR) 10 MG tablet TAKE 1 TABLET BY MOUTH AT  BEDTIME 90 tablet 3   niacin (NIASPAN) 1000 MG CR tablet Take 1 tablet (1,000 mg total) by mouth at bedtime. 90 tablet 1   sertraline (ZOLOFT) 100 MG tablet TAKE 1 TABLET BY MOUTH  DAILY 90 tablet 3   simvastatin  (ZOCOR) 20 MG tablet Take 1 tablet (20 mg total) by mouth every evening. 90 tablet 1   traZODone (DESYREL) 50 MG tablet TAKE 1/2 TO 1 TABLET BY  MOUTH AT BEDTIME AS NEEDED  FOR SLEEP 90 tablet 0   VYVANSE 50 MG capsule Take 60 mg by mouth every morning.     No current facility-administered medications for this visit.    Allergies  Allergen Reactions   Flonase [Fluticasone Propionate] Other (See Comments)    Nasal irritation, burning, but can tolerate advair inhaler.     Sulfa Antibiotics Hives    Family History  Problem Relation Age of Onset   COPD Mother    Heart disease Mother    Polymyalgia rheumatica Mother    Emphysema Mother    Stroke Mother    Depression Mother    Anxiety disorder Mother    Heart disease Father    Hypertension Father    Hyperlipidemia Father    Anxiety disorder Father    Cancer Paternal Aunt        Breast   Breast cancer Paternal Aunt 33   Stroke Maternal Grandmother    Cancer Maternal Aunt    Other Brother  melas syndrom   Parkinson's disease Maternal Grandfather     Social History   Socioeconomic History   Marital status: Married    Spouse name: Linna Hoff   Number of children: 2   Years of education: college   Highest education level: Bachelor's degree (e.g., BA, AB, BS)  Occupational History   Occupation: Patent attorney  Tobacco Use   Smoking status: Never   Smokeless tobacco: Never  Vaping Use   Vaping Use: Never used  Substance and Sexual Activity   Alcohol use: Not Currently   Drug use: No   Sexual activity: Not on file  Other Topics Concern   Not on file  Social History Narrative   Lives at home with family.   Right-handed.   Caffeine use: Diet Pepsi - 8 ounces to 2 liters per day - varies.   Social Determinants of Health   Financial Resource Strain: Not on file  Food Insecurity: Not on file  Transportation Needs: Not on file  Physical Activity: Not on file  Stress: Not on file  Social Connections:  Not on file  Intimate Partner Violence: Not on file     Constitutional: Patient reports intermittent headaches.  Denies fever, malaise, fatigue, or abrupt weight changes.  HEENT: Denies eye pain, eye redness, ear pain, ringing in the ears, wax buildup, runny nose, nasal congestion, bloody nose, or sore throat. Respiratory: Denies difficulty breathing, shortness of breath, cough or sputum production.   Cardiovascular: Denies chest pain, chest tightness, palpitations or swelling in the hands or feet.  Gastrointestinal: Denies abdominal pain, bloating, constipation, diarrhea or blood in the stool.  GU: Denies urgency, frequency, pain with urination, burning sensation, blood in urine, odor or discharge. Musculoskeletal: Pt reports intermittent hip. Denies decrease in range of motion, difficulty with gait, muscle pain or joint swelling.  Skin: Denies redness, rashes, lesions or ulcercations.  Neurological: Patient reports insomnia, difficulty with memory, numbness and tingling of her right thigh.  Denies dizziness, difficulty with speech or problems with balance and coordination.  Psych: Patient has a history of anxiety and depression.  Denies SI/HI.  No other specific complaints in a complete review of systems (except as listed in HPI above).  Objective:   Physical Exam  BP 136/86 (BP Location: Left Arm, Patient Position: Sitting, Cuff Size: Large)   Pulse 76   Temp (!) 96.8 F (36 C) (Temporal)   Ht 5' 7.5" (1.715 m)   Wt 248 lb (112.5 kg)   SpO2 99%   BMI 38.27 kg/m   Wt Readings from Last 3 Encounters:  07/17/21 236 lb (107 kg)  12/01/20 232 lb 6.4 oz (105.4 kg)  10/18/20 239 lb (108.4 kg)    General: Appears her stated age, obese, in NAD. Skin: Warm, dry and intact.  Acne noted on face. HEENT: Head: normal shape and size; Eyes: sclera white, no icterus, conjunctiva pink, PERRLA and EOMs intact;  Neck:  Neck supple, trachea midline. No masses, lumps or thyromegaly present.   Cardiovascular: Normal rate and rhythm. S1,S2 noted.  No murmur, rubs or gallops noted. No JVD or BLE edema. No carotid bruits noted. Pulmonary/Chest: Normal effort and positive vesicular breath sounds. No respiratory distress. No wheezes, rales or ronchi noted.  Abdomen:  Normal bowel sounds.  Musculoskeletal: Strength 5/5 BUE/BLE.  No difficulty with gait.  Neurological: Alert and oriented. Cranial nerves II-XII grossly intact. Coordination normal.  Psychiatric: Mood and affect normal. Behavior is normal. Judgment and thought content normal.   BMET  Component Value Date/Time   NA 142 08/31/2019 1005   K 4.7 08/31/2019 1005   CL 108 (H) 08/31/2019 1005   CO2 24 08/31/2019 1005   GLUCOSE 100 (H) 08/31/2019 1005   GLUCOSE 93 11/13/2015 1501   BUN 17 08/31/2019 1005   CREATININE 0.8 11/30/2020 0000   CREATININE 1.12 (H) 08/31/2019 1005   CALCIUM 9.1 08/31/2019 1005   GFRNONAA 89 11/30/2020 0000   GFRAA 64 08/31/2019 1005    Lipid Panel     Component Value Date/Time   CHOL 139 11/30/2020 0000   CHOL 174 08/31/2019 1005   TRIG 99 11/30/2020 0000   HDL 56 11/30/2020 0000   HDL 55 08/31/2019 1005   CHOLHDL 3.2 08/31/2019 1005   CHOLHDL 3 11/13/2015 1501   VLDL 29.8 11/13/2015 1501   LDLCALC 65 11/30/2020 0000   LDLCALC 93 08/31/2019 1005    CBC    Component Value Date/Time   WBC 6.7 08/31/2019 1005   WBC 8.6 11/13/2015 1501   RBC 4.23 08/31/2019 1005   RBC 4.49 11/13/2015 1501   HGB 13.7 08/31/2019 1005   HCT 39.9 08/31/2019 1005   PLT 233 08/31/2019 1005   MCV 94 08/31/2019 1005   MCH 32.4 08/31/2019 1005   MCHC 34.3 08/31/2019 1005   MCHC 34.0 11/13/2015 1501   RDW 12.5 08/31/2019 1005   LYMPHSABS 1.4 10/16/2017 1000   EOSABS 0.2 10/16/2017 1000   BASOSABS 0.0 10/16/2017 1000    Hgb A1C Lab Results  Component Value Date   HGBA1C 5.3 11/30/2020            Assessment & Plan:   Preventative Health Maintenance:  Encouraged her to get a flu shot  in the fall Tetanus UTD Encouraged her to get her COVID booster Pneumovax UTD Discussed Shingrix vaccine, she will check coverage with her insurance company and schedule a nurse visit if she would like to have this done Pap smear UTD Mammogram and bone density ordered-she will call to schedule Colonoscopy scheduled Encouraged her to consume a balanced diet and exercise regimen Advised her to see an eye doctor and dentist annually We will check CBC, c-Met, lipid and hep C today  RTC in 6 months, follow-up chronic conditions Webb Silversmith, NP

## 2021-12-14 ENCOUNTER — Other Ambulatory Visit: Payer: Self-pay | Admitting: Student

## 2021-12-14 DIAGNOSIS — R413 Other amnesia: Secondary | ICD-10-CM

## 2021-12-14 LAB — LIPID PANEL
Cholesterol: 173 mg/dL (ref <200)
HDL: 60 mg/dL (ref >=50)
LDL Cholesterol (Calc): 91 mg/dL (calc)
Non-HDL Cholesterol (Calc): 113 mg/dL (calc) (ref <130)
Total CHOL/HDL Ratio: 2.9 (calc) (ref <5.0)
Triglycerides: 122 mg/dL (ref <150)

## 2021-12-14 LAB — COMPLETE METABOLIC PANEL WITH GFR
AG Ratio: 2.1 (calc) (ref 1.0–2.5)
ALT: 18 U/L (ref 6–29)
AST: 19 U/L (ref 10–35)
Albumin: 4.4 g/dL (ref 3.6–5.1)
Alkaline phosphatase (APISO): 111 U/L (ref 37–153)
BUN: 17 mg/dL (ref 7–25)
CO2: 28 mmol/L (ref 20–32)
Calcium: 9.1 mg/dL (ref 8.6–10.4)
Chloride: 105 mmol/L (ref 98–110)
Creat: 0.8 mg/dL (ref 0.50–1.03)
Globulin: 2.1 g/dL (calc) (ref 1.9–3.7)
Glucose, Bld: 96 mg/dL (ref 65–139)
Potassium: 3.9 mmol/L (ref 3.5–5.3)
Sodium: 143 mmol/L (ref 135–146)
Total Bilirubin: 0.4 mg/dL (ref 0.2–1.2)
Total Protein: 6.5 g/dL (ref 6.1–8.1)
eGFR: 86 mL/min/{1.73_m2} (ref >=60)

## 2021-12-14 LAB — HEMOGLOBIN A1C
Hgb A1c MFr Bld: 5 % of total Hgb (ref <5.7)
Mean Plasma Glucose: 97 mg/dL
eAG (mmol/L): 5.4 mmol/L

## 2021-12-14 LAB — CBC
HCT: 38.8 % (ref 35.0–45.0)
Hemoglobin: 13 g/dL (ref 11.7–15.5)
MCH: 31.6 pg (ref 27.0–33.0)
MCHC: 33.5 g/dL (ref 32.0–36.0)
MCV: 94.4 fL (ref 80.0–100.0)
MPV: 9.6 fL (ref 7.5–12.5)
Platelets: 214 10*3/uL (ref 140–400)
RBC: 4.11 10*6/uL (ref 3.80–5.10)
RDW: 12.1 % (ref 11.0–15.0)
WBC: 6.4 10*3/uL (ref 3.8–10.8)

## 2021-12-14 LAB — HEPATITIS C ANTIBODY: Hepatitis C Ab: NONREACTIVE

## 2021-12-26 ENCOUNTER — Encounter (INDEPENDENT_AMBULATORY_CARE_PROVIDER_SITE_OTHER): Payer: Self-pay

## 2022-01-02 ENCOUNTER — Ambulatory Visit
Admission: RE | Admit: 2022-01-02 | Discharge: 2022-01-02 | Disposition: A | Discharge status: Home / Self Care | Payer: Managed Care, Other (non HMO) | Source: Ambulatory Visit | Attending: Student | Admitting: Student

## 2022-01-02 DIAGNOSIS — R413 Other amnesia: Secondary | ICD-10-CM

## 2022-01-11 ENCOUNTER — Encounter: Payer: Self-pay | Admitting: *Deleted

## 2022-01-14 ENCOUNTER — Other Ambulatory Visit: Payer: Self-pay

## 2022-01-14 ENCOUNTER — Encounter
Admission: RE | Disposition: A | Discharge status: Home / Self Care | Payer: Self-pay | Source: Home / Self Care | Attending: Gastroenterology

## 2022-01-14 ENCOUNTER — Ambulatory Visit: Payer: Managed Care, Other (non HMO) | Admitting: Anesthesiology

## 2022-01-14 ENCOUNTER — Ambulatory Visit
Admission: RE | Admit: 2022-01-14 | Discharge: 2022-01-14 | Disposition: A | Discharge status: Home / Self Care | Payer: Managed Care, Other (non HMO) | Attending: Gastroenterology | Admitting: Gastroenterology

## 2022-01-14 DIAGNOSIS — Z01818 Encounter for other preprocedural examination: Secondary | ICD-10-CM | POA: Diagnosis present

## 2022-01-14 DIAGNOSIS — Z538 Procedure and treatment not carried out for other reasons: Secondary | ICD-10-CM | POA: Diagnosis not present

## 2022-01-14 SURGERY — COLONOSCOPY WITH PROPOFOL
Anesthesia: General

## 2022-01-14 MED ORDER — SODIUM CHLORIDE 0.9 % IV SOLN: INTRAVENOUS | Status: DC

## 2022-01-14 NOTE — OR Nursing (Signed)
Patient still having brown sediment stool only drank half of prep. Dr. Mia Creek spoke with patient and will reschedule.

## 2022-01-25 ENCOUNTER — Ambulatory Visit
Admission: RE | Admit: 2022-01-25 | Discharge: 2022-01-25 | Disposition: A | Discharge status: Home / Self Care | Payer: Managed Care, Other (non HMO) | Source: Ambulatory Visit | Attending: Internal Medicine | Admitting: Internal Medicine

## 2022-01-25 DIAGNOSIS — Z1231 Encounter for screening mammogram for malignant neoplasm of breast: Secondary | ICD-10-CM | POA: Diagnosis present

## 2022-01-25 DIAGNOSIS — Z780 Asymptomatic menopausal state: Secondary | ICD-10-CM

## 2022-01-28 ENCOUNTER — Encounter: Payer: Self-pay | Admitting: Internal Medicine

## 2022-02-16 ENCOUNTER — Other Ambulatory Visit: Payer: Self-pay | Admitting: Internal Medicine

## 2022-02-16 DIAGNOSIS — I10 Essential (primary) hypertension: Secondary | ICD-10-CM

## 2022-02-18 NOTE — Telephone Encounter (Signed)
Requested Prescriptions  Pending Prescriptions Disp Refills  . losartan (COZAAR) 25 MG tablet [Pharmacy Med Name: LOSARTAN 25MG  TABLETS] 90 tablet 0    Sig: TAKE 1 TABLET(25 MG) BY MOUTH DAILY     Cardiovascular:  Angiotensin Receptor Blockers Passed - 02/16/2022  7:01 AM      Passed - Cr in normal range and within 180 days    Creat  Date Value Ref Range Status  12/13/2021 0.80 0.50 - 1.03 mg/dL Final         Passed - K in normal range and within 180 days    Potassium  Date Value Ref Range Status  12/13/2021 3.9 3.5 - 5.3 mmol/L Final         Passed - Patient is not pregnant      Passed - Last BP in normal range    BP Readings from Last 1 Encounters:  12/13/21 136/86         Passed - Valid encounter within last 6 months    Recent Outpatient Visits          2 months ago Encounter for screening mammogram for malignant neoplasm of breast   [place], NP   7 months ago Essential hypertension   [place], Coralie Keens, NP   1 year ago Encounter for general adult medical examination with abnormal findings   [facility] [place], Coralie Keens, NP

## 2022-04-10 ENCOUNTER — Other Ambulatory Visit: Payer: Self-pay | Admitting: Internal Medicine

## 2022-04-12 NOTE — Telephone Encounter (Signed)
Requested medications are due for refill today.  yes  Requested medications are on the active medications list.  yes  Last refill. 09/15/2020 #90 0 rf  Future visit scheduled.   no  Notes to clinic.  Rx signed by Worthy Rancher.    Requested Prescriptions  Pending Prescriptions Disp Refills   traZODone (DESYREL) 50 MG tablet [Pharmacy Med Name: TRAZODONE 50MG  TABLETS] 90 tablet 0    Sig: TAKE 1 TABLET BY MOUTH ONCE DAILY     Psychiatry: Antidepressants - Serotonin Modulator Passed - 04/10/2022  5:46 PM      Passed - Completed PHQ-2 or PHQ-9 in the last 360 days      Passed - Valid encounter within last 6 months    Recent Outpatient Visits           4 months ago Encounter for screening mammogram for malignant neoplasm of breast   [facility] [place], [place], NP   8 months ago Essential hypertension   [facility] [place], Mullins, NP   1 year ago Encounter for general adult medical examination with abnormal findings   [facility] [place], [place], NP

## 2022-04-29 ENCOUNTER — Ambulatory Visit: Admit: 2022-04-29 | Payer: Managed Care, Other (non HMO)

## 2022-04-29 SURGERY — COLONOSCOPY WITH PROPOFOL
Anesthesia: General

## 2022-05-05 ENCOUNTER — Other Ambulatory Visit: Payer: Self-pay | Admitting: Internal Medicine

## 2022-05-06 NOTE — Telephone Encounter (Signed)
Requested Prescriptions  Pending Prescriptions Disp Refills   niacin (NIASPAN) 1000 MG CR tablet [Pharmacy Med Name: NIACIN ER 1000MG  TABLETS] 90 tablet 0    Sig: TAKE 1 TABLET(1000 MG) BY MOUTH AT BEDTIME     Cardiovascular:  Antilipid - niacin Failed - 05/05/2022  7:12 AM      Failed - Lipid Panel in normal range within the last 12 months    Cholesterol, Total  Date Value Ref Range Status  08/31/2019 174 100 - 199 mg/dL Final   Cholesterol  Date Value Ref Range Status  12/13/2021 173 <200 mg/dL Final   LDL Cholesterol (Calc)  Date Value Ref Range Status  12/13/2021 91 mg/dL (calc) Final    Comment:    Reference range: <100 . Desirable range <100 mg/dL for primary prevention;   <70 mg/dL for patients with CHD or diabetic patients  with > or = 2 CHD risk factors. 12/15/2021 LDL-C is now calculated using the Martin-Hopkins  calculation, which is a validated novel method providing  better accuracy than the Friedewald equation in the  estimation of LDL-C.  [name] et al. Horald Pollen. Lenox Ahr): 2061-2068  (http://education.[facility].com/faq/FAQ164)    HDL  Date Value Ref Range Status  12/13/2021 60 > OR = 50 mg/dL Final  12/15/2021 55 05/22/7251 mg/dL Final   Triglycerides  Date Value Ref Range Status  12/13/2021 122 <150 mg/dL Final         Passed - AST in normal range and within 180 days    AST  Date Value Ref Range Status  12/13/2021 19 10 - 35 U/L Final         Passed - ALT in normal range and within 180 days    ALT  Date Value Ref Range Status  12/13/2021 18 6 - 29 U/L Final         Passed - Valid encounter within last 12 months    Recent Outpatient Visits           4 months ago Encounter for screening mammogram for malignant neoplasm of breast   [facility] [place], [place], NP   9 months ago Essential hypertension   [facility] [place], [place], NP   1 year ago Encounter for general adult medical examination with abnormal  findings   [facility] [place], [place], NP

## 2022-05-21 ENCOUNTER — Other Ambulatory Visit: Payer: Self-pay | Admitting: Internal Medicine

## 2022-05-22 NOTE — Telephone Encounter (Signed)
Requested Prescriptions  Pending Prescriptions Disp Refills   simvastatin (ZOCOR) 20 MG tablet [Pharmacy Med Name: SIMVASTATIN 20MG  TABLETS] 90 tablet 0    Sig: TAKE 1 TABLET(20 MG) BY MOUTH EVERY EVENING     Cardiovascular:  Antilipid - Statins Failed - 05/21/2022  3:14 PM      Failed - Lipid Panel in normal range within the last 12 months    Cholesterol, Total  Date Value Ref Range Status  08/31/2019 174 100 - 199 mg/dL Final   Cholesterol  Date Value Ref Range Status  12/13/2021 173 <200 mg/dL Final   LDL Cholesterol (Calc)  Date Value Ref Range Status  12/13/2021 91 mg/dL (calc) Final    Comment:    Reference range: <100 . Desirable range <100 mg/dL for primary prevention;   <70 mg/dL for patients with CHD or diabetic patients  with > or = 2 CHD risk factors. [name] LDL-C is now calculated using the Martin-Hopkins  calculation, which is a validated novel method providing  better accuracy than the Friedewald equation in the  estimation of LDL-C.  Cresenciano Genre et al. Annamaria Helling. 1275;170(01): 2061-2068  (http://education.[facility].com/faq/FAQ164)    HDL  Date Value Ref Range Status  12/13/2021 60 > OR = 50 mg/dL Final  08/31/2019 55 >39 mg/dL Final   Triglycerides  Date Value Ref Range Status  12/13/2021 122 <150 mg/dL Final         Passed - Patient is not pregnant      Passed - Valid encounter within last 12 months    Recent Outpatient Visits           5 months ago Encounter for screening mammogram for malignant neoplasm of breast   [facility] [place], Coralie Keens, NP   10 months ago Essential hypertension   [place], NP   1 year ago Encounter for general adult medical examination with abnormal findings   [facility] [place], Coralie Keens, NP

## 2022-05-24 ENCOUNTER — Other Ambulatory Visit: Payer: Self-pay

## 2022-06-28 ENCOUNTER — Other Ambulatory Visit: Payer: Self-pay

## 2022-06-28 MED ORDER — LISDEXAMFETAMINE DIMESYLATE 70 MG PO CAPS
70.0000 mg | ORAL_CAPSULE | Freq: Every morning | ORAL | 0 refills | Status: DC
  Filled 2022-06-28 – 2022-07-18 (×2): qty 30, 30d supply, fill #0

## 2022-06-28 MED ORDER — SERTRALINE HCL 100 MG PO TABS
100.0000 mg | ORAL_TABLET | Freq: Every day | ORAL | 0 refills | Status: DC
  Filled 2022-07-18: qty 90, 90d supply, fill #0

## 2022-06-28 MED ORDER — GUANFACINE HCL ER 2 MG PO TB24: 2.0000 mg | ORAL_TABLET | Freq: Every day | ORAL | 0 refills | Status: DC

## 2022-06-28 MED ORDER — TRAZODONE HCL 50 MG PO TABS
50.0000 mg | ORAL_TABLET | Freq: Every day | ORAL | 0 refills | Status: DC
  Filled 2022-07-18: qty 60, 60d supply, fill #0
  Filled 2022-07-18: qty 30, 30d supply, fill #0

## 2022-07-04 ENCOUNTER — Other Ambulatory Visit: Payer: Self-pay

## 2022-07-04 MED ORDER — LISDEXAMFETAMINE DIMESYLATE 70 MG PO CAPS: 70.0000 mg | ORAL_CAPSULE | Freq: Every morning | ORAL | 0 refills | Status: DC

## 2022-07-18 ENCOUNTER — Other Ambulatory Visit: Payer: Self-pay | Admitting: Internal Medicine

## 2022-07-18 ENCOUNTER — Other Ambulatory Visit: Payer: Self-pay

## 2022-07-18 DIAGNOSIS — I10 Essential (primary) hypertension: Secondary | ICD-10-CM

## 2022-07-19 NOTE — Telephone Encounter (Signed)
Requested medications are due for refill today.  yes  Requested medications are on the active medications list.  yes  Last refill. 02/18/2022 #90 0 rf  Future visit scheduled.   no  Notes to clinic.  It appears that pt is going to another clinic for care.     Requested Prescriptions  Pending Prescriptions Disp Refills   losartan (COZAAR) 25 MG tablet [Pharmacy Med Name: LOSARTAN '25MG'$  TABLETS] 90 tablet 0    Sig: TAKE 1 TABLET(25 MG) BY MOUTH DAILY     Cardiovascular:  Angiotensin Receptor Blockers Failed - 07/18/2022  8:06 PM      Failed - Cr in normal range and within 180 days    Creat  Date Value Ref Range Status  12/13/2021 0.80 0.50 - 1.03 mg/dL Final         Failed - K in normal range and within 180 days    Potassium  Date Value Ref Range Status  12/13/2021 3.9 3.5 - 5.3 mmol/L Final         Failed - Valid encounter within last 6 months    Recent Outpatient Visits           7 months ago Encounter for screening mammogram for malignant neoplasm of breast   [facility] [place], Coralie Keens, NP   1 year ago Essential hypertension   [place], NP   1 year ago Encounter for general adult medical examination with abnormal findings   [place], [place]              Passed - Patient is not pregnant      Passed - Last BP in normal range    BP Readings from Last 1 Encounters:  12/13/21 136/86

## 2022-07-22 ENCOUNTER — Other Ambulatory Visit: Payer: Self-pay

## 2022-07-22 MED ORDER — MONTELUKAST SODIUM 10 MG PO TABS
10.0000 mg | ORAL_TABLET | Freq: Every day | ORAL | 5 refills | Status: DC
  Filled 2022-07-22: qty 30, 30d supply, fill #0

## 2022-07-22 MED ORDER — ALBUTEROL SULFATE HFA 108 (90 BASE) MCG/ACT IN AERS
INHALATION_SPRAY | RESPIRATORY_TRACT | 3 refills | Status: DC
  Filled 2022-07-22: qty 6.7, 30d supply, fill #0

## 2022-07-22 MED ORDER — NIACIN ER (ANTIHYPERLIPIDEMIC) 1000 MG PO TBCR
1000.0000 mg | EXTENDED_RELEASE_TABLET | Freq: Every evening | ORAL | 1 refills | Status: DC
Start: 2022-07-22 — End: 2023-04-01
  Filled 2022-07-22 – 2022-09-18 (×3): qty 90, 90d supply, fill #0
  Filled 2022-12-30: qty 90, 90d supply, fill #1

## 2022-07-22 MED ORDER — LOSARTAN POTASSIUM 25 MG PO TABS
25.0000 mg | ORAL_TABLET | Freq: Every day | ORAL | 1 refills | Status: DC
  Filled 2022-07-22: qty 90, 90d supply, fill #0
  Filled 2022-10-30: qty 90, 90d supply, fill #1

## 2022-07-22 MED ORDER — SIMVASTATIN 20 MG PO TABS
20.0000 mg | ORAL_TABLET | Freq: Every day | ORAL | 1 refills | Status: DC
  Filled 2022-07-22 – 2022-09-18 (×2): qty 90, 90d supply, fill #0
  Filled 2022-12-25: qty 90, 90d supply, fill #1

## 2022-07-23 ENCOUNTER — Other Ambulatory Visit: Payer: Self-pay

## 2022-08-17 ENCOUNTER — Other Ambulatory Visit: Payer: Self-pay | Admitting: Internal Medicine

## 2022-08-19 NOTE — Telephone Encounter (Signed)
Patient will need an office visit for further refills. Requested Prescriptions  Pending Prescriptions Disp Refills   niacin (NIASPAN) 1000 MG CR tablet [Pharmacy Med Name: NIACIN ER 1000MG  TABLETS] 90 tablet 0    Sig: TAKE 1 TABLET(1000 MG) BY MOUTH AT BEDTIME     Cardiovascular:  Antilipid - niacin Failed - 08/17/2022  7:03 AM      Failed - AST in normal range and within 180 days    AST  Date Value Ref Range Status  12/13/2021 19 10 - 35 U/L Final         Failed - ALT in normal range and within 180 days    ALT  Date Value Ref Range Status  12/13/2021 18 6 - 29 U/L Final         Failed - Lipid Panel in normal range within the last 12 months    Cholesterol, Total  Date Value Ref Range Status  08/31/2019 174 100 - 199 mg/dL Final   Cholesterol  Date Value Ref Range Status  12/13/2021 173 <200 mg/dL Final   LDL Cholesterol (Calc)  Date Value Ref Range Status  12/13/2021 91 mg/dL (calc) Final    Comment:    Reference range: <100 . Desirable range <100 mg/dL for primary prevention;   <70 mg/dL for patients with CHD or diabetic patients  with > or = 2 CHD risk factors. [name] LDL-C is now calculated using the Martin-Hopkins  calculation, which is a validated novel method providing  better accuracy than the Friedewald equation in the  estimation of LDL-C.  Cresenciano Genre et al. Annamaria Helling. [id number]): 2061-2068  (http://education.[facility].com/faq/FAQ164)    HDL  Date Value Ref Range Status  12/13/2021 60 > OR = 50 mg/dL Final  08/31/2019 55 >39 mg/dL Final   Triglycerides  Date Value Ref Range Status  12/13/2021 122 <150 mg/dL Final         Passed - Valid encounter within last 12 months    Recent Outpatient Visits           8 months ago Encounter for screening mammogram for malignant neoplasm of breast   [facility] [place], Coralie Keens, NP   1 year ago Essential hypertension   [facility], Coralie Keens, NP   1  year ago Encounter for general adult medical examination with abnormal findings   [facility] [place], Coralie Keens, NP               simvastatin (ZOCOR) 20 MG tablet [Pharmacy Med Name: SIMVASTATIN 20MG  TABLETS] 90 tablet 0    Sig: TAKE 1 TABLET(20 MG) BY MOUTH EVERY EVENING     Cardiovascular:  Antilipid - Statins Failed - 08/17/2022  7:03 AM      Failed - Lipid Panel in normal range within the last 12 months    Cholesterol, Total  Date Value Ref Range Status  08/31/2019 174 100 - 199 mg/dL Final   Cholesterol  Date Value Ref Range Status  12/13/2021 173 <200 mg/dL Final   LDL Cholesterol (Calc)  Date Value Ref Range Status  12/13/2021 91 mg/dL (calc) Final    Comment:    Reference range: <100 . Desirable range <100 mg/dL for primary prevention;   <70 mg/dL for patients with CHD or diabetic patients  with > or = 2 CHD risk factors. [name] LDL-C is now calculated using the Martin-Hopkins  calculation, which is a validated novel method providing  better accuracy than the Friedewald equation in the  estimation of LDL-C.  Cresenciano Genre et al. Annamaria Helling. [id number]): 2061-2068  (http://education.[facility].com/faq/FAQ164)    HDL  Date Value Ref Range Status  12/13/2021 60 > OR = 50 mg/dL Final  08/31/2019 55 >39 mg/dL Final   Triglycerides  Date Value Ref Range Status  12/13/2021 122 <150 mg/dL Final         Passed - Patient is not pregnant      Passed - Valid encounter within last 12 months    Recent Outpatient Visits           8 months ago Encounter for screening mammogram for malignant neoplasm of breast   [facility] [place], Coralie Keens, NP   1 year ago Essential hypertension   [facility] [place], Coralie Keens, NP   1 year ago Encounter for general adult medical examination with abnormal findings   [facility] [place], Coralie Keens, NP

## 2022-08-28 ENCOUNTER — Other Ambulatory Visit: Payer: Self-pay

## 2022-09-13 ENCOUNTER — Other Ambulatory Visit: Payer: Self-pay

## 2022-09-16 ENCOUNTER — Other Ambulatory Visit: Payer: Self-pay

## 2022-09-16 MED ORDER — GUANFACINE HCL ER 2 MG PO TB24
2.0000 mg | ORAL_TABLET | Freq: Every day | ORAL | 0 refills | Status: DC
  Filled 2022-09-16: qty 90, 90d supply, fill #0

## 2022-09-16 MED ORDER — SERTRALINE HCL 100 MG PO TABS
100.0000 mg | ORAL_TABLET | Freq: Every day | ORAL | 0 refills | Status: DC
  Filled 2022-09-16 – 2022-10-30 (×2): qty 90, 90d supply, fill #0

## 2022-09-16 MED ORDER — LISDEXAMFETAMINE DIMESYLATE 70 MG PO CAPS
70.0000 mg | ORAL_CAPSULE | Freq: Every morning | ORAL | 0 refills | Status: DC
  Filled 2022-09-16: qty 30, 30d supply, fill #0

## 2022-09-16 MED ORDER — LISDEXAMFETAMINE DIMESYLATE 70 MG PO CAPS: 70.0000 mg | ORAL_CAPSULE | Freq: Every morning | ORAL | 0 refills | Status: DC

## 2022-09-16 MED ORDER — TRAZODONE HCL 50 MG PO TABS
50.0000 mg | ORAL_TABLET | Freq: Every day | ORAL | 0 refills | Status: DC
  Filled 2022-09-16: qty 90, 90d supply, fill #0

## 2022-09-18 ENCOUNTER — Other Ambulatory Visit: Payer: Self-pay

## 2022-09-19 ENCOUNTER — Other Ambulatory Visit: Payer: Self-pay

## 2022-09-20 ENCOUNTER — Other Ambulatory Visit: Payer: Self-pay

## 2022-09-26 ENCOUNTER — Other Ambulatory Visit: Payer: Self-pay

## 2022-10-25 ENCOUNTER — Other Ambulatory Visit: Payer: Self-pay

## 2022-10-25 MED ORDER — TRAZODONE HCL 50 MG PO TABS
50.0000 mg | ORAL_TABLET | Freq: Every day | ORAL | 0 refills | Status: DC
  Filled 2022-10-25: qty 90, 90d supply, fill #0

## 2022-10-30 ENCOUNTER — Other Ambulatory Visit: Payer: Self-pay

## 2022-11-04 ENCOUNTER — Other Ambulatory Visit: Payer: Self-pay

## 2022-12-19 ENCOUNTER — Other Ambulatory Visit: Payer: Self-pay | Admitting: Student

## 2022-12-19 ENCOUNTER — Other Ambulatory Visit: Payer: Self-pay

## 2022-12-19 DIAGNOSIS — R42 Dizziness and giddiness: Secondary | ICD-10-CM

## 2022-12-19 MED ORDER — LISDEXAMFETAMINE DIMESYLATE 70 MG PO CAPS
70.0000 mg | ORAL_CAPSULE | Freq: Every morning | ORAL | 0 refills | Status: DC
  Filled 2022-12-19 – 2023-03-07 (×3): qty 30, 30d supply, fill #0

## 2022-12-23 ENCOUNTER — Other Ambulatory Visit: Payer: Self-pay | Admitting: Student

## 2022-12-23 ENCOUNTER — Ambulatory Visit
Admission: RE | Admit: 2022-12-23 | Discharge: 2022-12-23 | Disposition: A | Discharge status: Home / Self Care | Payer: Managed Care, Other (non HMO) | Source: Ambulatory Visit | Attending: Student | Admitting: Student

## 2022-12-23 ENCOUNTER — Other Ambulatory Visit: Payer: Self-pay | Admitting: Physician Assistant

## 2022-12-23 DIAGNOSIS — R42 Dizziness and giddiness: Secondary | ICD-10-CM

## 2022-12-23 DIAGNOSIS — M2391 Unspecified internal derangement of right knee: Secondary | ICD-10-CM

## 2022-12-25 ENCOUNTER — Other Ambulatory Visit: Payer: Self-pay

## 2022-12-30 ENCOUNTER — Other Ambulatory Visit: Payer: Self-pay

## 2022-12-30 ENCOUNTER — Other Ambulatory Visit: Payer: Self-pay | Admitting: Family Medicine

## 2022-12-30 DIAGNOSIS — Z1231 Encounter for screening mammogram for malignant neoplasm of breast: Secondary | ICD-10-CM

## 2022-12-30 MED ORDER — WEGOVY 0.25 MG/0.5ML ~~LOC~~ SOAJ
0.2500 mg | SUBCUTANEOUS | 0 refills | Status: DC
Start: 2022-12-30 — End: 2023-01-31
  Filled 2022-12-30 – 2022-12-31 (×2): qty 2, 28d supply, fill #0

## 2022-12-31 ENCOUNTER — Other Ambulatory Visit: Payer: Self-pay

## 2023-01-01 ENCOUNTER — Other Ambulatory Visit: Payer: Self-pay

## 2023-01-02 ENCOUNTER — Other Ambulatory Visit: Payer: Self-pay

## 2023-01-08 ENCOUNTER — Other Ambulatory Visit: Payer: Self-pay

## 2023-01-08 ENCOUNTER — Encounter: Payer: Self-pay | Admitting: Student

## 2023-01-08 MED ORDER — TRAZODONE HCL 100 MG PO TABS
100.0000 mg | ORAL_TABLET | Freq: Every evening | ORAL | 0 refills | Status: DC
Start: 2023-01-08 — End: 2023-04-14
  Filled 2023-01-08: qty 90, 90d supply, fill #0

## 2023-01-08 MED ORDER — SERTRALINE HCL 100 MG PO TABS
100.0000 mg | ORAL_TABLET | Freq: Every day | ORAL | 0 refills | Status: DC
  Filled 2023-01-31: qty 90, 90d supply, fill #0

## 2023-01-12 ENCOUNTER — Other Ambulatory Visit: Payer: Self-pay

## 2023-01-12 MED ORDER — LISDEXAMFETAMINE DIMESYLATE 70 MG PO CAPS
70.0000 mg | ORAL_CAPSULE | Freq: Every morning | ORAL | 0 refills | Status: DC
  Filled 2023-06-03: qty 30, 30d supply, fill #0

## 2023-01-12 MED ORDER — LISDEXAMFETAMINE DIMESYLATE 70 MG PO CAPS
70.0000 mg | ORAL_CAPSULE | Freq: Every morning | ORAL | 0 refills | Status: DC
  Filled 2023-04-11: qty 30, 30d supply, fill #0

## 2023-01-13 ENCOUNTER — Ambulatory Visit
Admission: RE | Admit: 2023-01-13 | Discharge: 2023-01-13 | Disposition: A | Discharge status: Home / Self Care | Payer: Managed Care, Other (non HMO) | Source: Ambulatory Visit | Attending: Student | Admitting: Student

## 2023-01-13 DIAGNOSIS — R42 Dizziness and giddiness: Secondary | ICD-10-CM

## 2023-01-13 MED ORDER — GADOPICLENOL 0.5 MMOL/ML IV SOLN
10.0000 mL | Freq: Once | INTRAVENOUS | Status: AC | PRN
  Administered 2023-01-13: 10 mL via INTRAVENOUS

## 2023-01-21 ENCOUNTER — Ambulatory Visit: Payer: Managed Care, Other (non HMO)

## 2023-01-28 ENCOUNTER — Ambulatory Visit
Admission: RE | Admit: 2023-01-28 | Discharge: 2023-01-28 | Disposition: A | Discharge status: Home / Self Care | Payer: Managed Care, Other (non HMO) | Source: Ambulatory Visit | Attending: Family Medicine | Admitting: Family Medicine

## 2023-01-28 DIAGNOSIS — Z1231 Encounter for screening mammogram for malignant neoplasm of breast: Secondary | ICD-10-CM | POA: Insufficient documentation

## 2023-01-29 NOTE — Therapy (Signed)
OUTPATIENT PHYSICAL THERAPY VESTIBULAR EVALUATION  Patient Name: Donna Vasquez MRN: [id number] DOB:[birth year], 57 y.o., female Today's Date: 01/31/2023  END OF SESSION:  PT End of Session - 01/30/23 1656     Visit Number 1    Number of Visits 9    Date for PT Re-Evaluation 03/27/23    Authorization Type eval: 01/30/23    PT Start Time 1700    PT Stop Time 1745    PT Time Calculation (min) 45 min    Activity Tolerance Patient tolerated treatment well    Behavior During Therapy WFL for tasks assessed/performed            Past Medical History:  Diagnosis Date   Allergy    Asthma    Back pain    Bulging lumbar disc    Chicken pox    Constipation    Depression    Fatigue    Frequent headaches    Gall bladder disease    Headache    Heartburn    Hyperlipidemia    IBS (irritable bowel syndrome)    Interstitial cystitis    RA (rheumatoid arthritis) (HCC)    Shortness of breath on exertion    Staring episodes    Trouble in sleeping    Urine incontinence    Vertigo    Vision changes    Vitamin D deficiency    Past Surgical History:  Procedure Laterality Date   CHOLECYSTECTOMY     cyst removed from back     NASAL SINUS SURGERY     TUBAL LIGATION  1999   Patient Active Problem List   Diagnosis Date Noted   CKD (chronic kidney disease) stage 3, GFR 30-59 ml/min (HCC) 12/06/2020   Class 2 obesity due to excess calories with body mass index (BMI) of 38.0 to 38.9 in adult 09/15/2019   Rheumatoid arthritis (HCC) 07/22/2019   HTN (hypertension) 07/22/2019   Absence seizure (HCC) 07/22/2019   Chronic headaches 02/26/2018   Anxiety and depression 10/27/2017   OSA (obstructive sleep apnea) 08/19/2014   IBS (irritable bowel syndrome) 05/26/2014   Gastroesophageal reflux disease without esophagitis 05/26/2014   Allergy-induced asthma 05/26/2014   Insomnia 05/26/2014   HLD (hyperlipidemia) 05/26/2014   PCP: Alm Bustard, NP  REFERRING PROVIDER: Janice Coffin,  PA-C   REFERRING DIAG: Vertigo  RATIONALE FOR EVALUATION AND TREATMENT: Rehabilitation  THERAPY DIAG: Dizziness and giddiness  ONSET DATE: Pt unsure  FOLLOW-UP APPT SCHEDULED WITH REFERRING PROVIDER: Yes    SUBJECTIVE:   Chief Complaint:  Vertigo  Pertinent History Pt reports that she gets episodes of wooziness/spinning sensation when bending forward, tipping her head back, and rolling onto her right side. She is unsure when the episodes first started. She had similar episodes last year with spontaneous resolution. Pt uses a CPAP machine and doesn't always sleep well at night. She has an appointment to see another pulmonologist soon. She had a brain MRI and carotid doppler both of which were WNL. 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 [facility] consulted for MELAS testing (Mitochondrial encephalomyopathy, lactic acidosis, and stroke-like episodes) unremarkable.  Carotid doppler:  IMPRESSION:  1. Right carotid artery system: Patent without significant  atherosclerotic plaque formation.  2. Left carotid artery system: Patent without significant  atherosclerotic plaque formation.  3. Vertebral  artery system: Patent with antegrade flow bilaterally.   Brain MRI (01/13/23) Negative MRI of the brain. No acute or focal lesion to explain the patient's symptoms.  Description of dizziness: whoozy and vertigo Frequency: multiple times per week Duration: seconds Symptom nature: positional and motion provoked Progression of symptoms since onset: worse History of similar episodes: Yes, similar episode last year with spontaneous resolution;  Provocative Factors: bending over, rolling onto R side, position changes; Easing Factors: waiting for symptoms to pass  Auditory complaints  (tinnitus, pain, drainage, hearing loss, aural fullness): No Vision changes (diplopia, visual field loss, recent changes, recent eye exam): No Chest pain/palpitations: No History of head injury/concussion: Yes, severe concussion as a child; Stress/anxiety: Yes, history of anxiety, no history of panic attacks Migraines/headaches: Yes, wakes up with headaches occasionally. Bilateral temporal, no visual disturbance. No nausea/vomiting. Photophobia, phonophobia. No sensitivity to smells. Improve with Tylenol. No history of migraines; Nausea/vomiting: No Numbness/tingling: No Focal weakness: No Dysarthria/dysphagia/drop attacks: No  Has patient fallen in last 6 months? 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 Hobbies: Reading, painting, arts and crafts, spending time with grandchild; [employer] Flags: Negative for bowel/bladder changes, saddle paresthesia, personal history of cancer, chills/fever, night sweats, unexplained weight loss/gain  PRECAUTIONS: None  WEIGHT BEARING RESTRICTIONS No  LIVING ENVIRONMENT: No issues navigating home environment;  PATIENT GOALS Decrease symptoms   OBJECTIVE EXAMINATION  POSTURE: No gross deficits contributing to symptoms  NEUROLOGICAL SCREEN: (2+ unless otherwise noted.) N=normal  Ab=abnormal  Level Dermatome R L Myotome R L Reflex R L  C3 Anterior Neck N N Sidebend C2-3 N N Jaw CN V    C4 Top of Shoulder N N Shoulder Shrug C4 N N Hoffman's UMN    C5 Lateral Upper Arm N N Shoulder ABD C4-5 N N Biceps C5-6    C6 Lateral Arm/ Thumb N N Arm Flex/ Wrist Ext C5-6 N N Brachiorad. C5-6    C7 Middle Finger N N Arm Ext//Wrist Flex C6-7 N N Triceps C7    C8 4th & 5th Finger N N Flex/ Ext Carpi Ulnaris C8 N N Patellar (L3-4)    T1  Medial Arm N N Interossei T1 N N Gastrocnemius    L2 Medial thigh/groin N N Illiopsoas (L2-3) N N     L3 Lower thigh/med.knee N N Quadriceps (L3-4) N N     L4 Medial leg/lat thigh N N Tibialis Ant (L4-5) N N     L5 Lat. leg & dorsal foot N N EHL (L5) N N     S1 post/lat foot/thigh/leg N N Gastrocnemius (S1-2) N N     S2 Post./med. thigh & leg N N Hamstrings (L4-S3) N N      CRANIAL NERVES II, III, IV, VI: Pupils equal and reactive to light, visual acuity and visual fields are intact, extraocular muscles are intact  V: Facial sensation is intact and symmetric bilaterally  VII: Facial strength is intact and symmetric bilaterally  VIII: Hearing is normal as tested by gross conversation IX, X: Palate elevates midline, normal phonation, uvula midline XI: Shoulder shrug strength is intact  XII: Tongue protrudes midline   COORDINATION Finger to Nose: Normal Heel to Shin: Normal Pronator Drift: Negative Rapid Alternating Movements: Normal Finger to Thumb Opposition: Normal   RANGE OF  MOTION Cervical Spine AROM WFL and painless in all planes. No functional focal deficits in AROM noted in BUE/BLE  MANUAL MUSCLE TESTING BUE/BLE strength WNL without focal deficits  TRANSFERS/GAIT Independent for transfers and ambulation without assistive device   PATIENT SURVEYS FOTO: 67, predicted decline to 64  OCULOMOTOR / VESTIBULAR TESTING  Oculomotor Exam- Room Light  Findings Comments  Ocular Alignment normal   Ocular ROM normal   Spontaneous Nystagmus normal   Gaze-Holding Nystagmus normal   End-Gaze Nystagmus normal   Vergence (normal 2-3") not examined   Smooth Pursuit normal   Cross-Cover Test not examined   Saccades normal   VOR Cancellation normal   Left Head Impulse normal   Right Head Impulse normal   Static Acuity not examined   Dynamic Acuity not examined    Oculomotor Exam- Fixation Suppressed: Deferred  BPPV TESTS:  Symptoms Duration Intensity Nystagmus  L  Dix-Hallpike None   None  R Dix-Hallpike Vertigo 8s 8/10 Upbeating R torsional  L Head Roll      R Head Roll      L Sidelying Test      R Sidelying Test      (blank = not tested)  Clinical Test of Sensory Interaction for Balance (CTSIB): Deferred  FUNCTIONAL OUTCOME MEASURES  Results Comments  BERG    DGI    FGA    TUG    5TSTS    6 Minute Walk Test    10 Meter Gait Speed    (blank = not tested)   TODAY'S TREATMENT   Canalith Repositioning Treatment Pt treated with 1 bout of Epley Maneuver for presumed R posterior canal BPPV. Two minute holds in each position and retesting afterward which is negative for both vertigo and nystagmus.     PATIENT EDUCATION:  Education details: Plan of care, BPPV, HEP Person educated: Patient Education method: Explanation, video, handout Education comprehension: verbalized understanding   HOME EXERCISE PROGRAM:  Access Code: [email or web address] URL: [email or web address] Date: 01/30/2023 Prepared by: Ria Comment  Exercises - Self-Epley Maneuver Right Ear  - 1 x daily - 7 x weekly - 3 reps - 1 minute in each position hold  Patient Education - BPPV   ASSESSMENT: CLINICAL IMPRESSION: Patient is a 57 y.o. female who was seen today for physical therapy evaluation and treatment for dizziness. Examination findings consistent with R posterior canal BPPV. Pt treated with R Epley maneuver with negative retesting afterwards.  OBJECTIVE IMPAIRMENTS: dizziness.   ACTIVITY LIMITATIONS: bending and sleeping  PARTICIPATION LIMITATIONS: cleaning, shopping, and community activity  PERSONAL FACTORS: Past/current experiences and Time since onset of injury/illness/exacerbation are also affecting patient's functional outcome.   REHAB POTENTIAL: Excellent  CLINICAL DECISION MAKING: Stable/uncomplicated  EVALUATION COMPLEXITY: Low   GOALS:  SHORT TERM GOALS: Target date: 02/27/2023  Pt will be independent with HEP for dizziness in  order to decrease symptoms, improve balance,decrease fall risk, and improve function at home and work. Baseline: Goal status: INITIAL   LONG TERM GOALS: Target date: 03/27/2023   Pt will report 100% resolution of vertigo with rolling, bending, and changing positions.      Baseline:  Goal status: INITIAL  2.  Pt will decrease DHI score by at least 18 points in order to demonstrate clinically significant reduction in disability related to dizziness.  Baseline: 01/30/23: To be completed Goal status: INITIAL   PLAN: PT FREQUENCY: 1x/week  PT DURATION: 8 weeks  PLANNED INTERVENTIONS: Therapeutic exercises, Therapeutic activity, Neuromuscular re-education, Balance training, Gait training, Patient/Family  education, Self Care, Joint mobilization, Joint manipulation, Vestibular training, Canalith repositioning, Orthotic/Fit training, DME instructions, Dry Needling, Electrical stimulation, Spinal manipulation, Spinal mobilization, Cryotherapy, Moist heat, Taping, Traction, Ultrasound, Ionotophoresis 4mg /ml Dexamethasone, Manual therapy, and Re-evaluation.  PLAN FOR NEXT SESSION: complete DHI, repeat BPPV testing and treatment, once clear consider balance screening and/or discharge.   Sharalyn Ink Sarahmarie Leavey PT, DPT, GCS  Leesha Veno, PT 01/31/2023, 11:29 AM

## 2023-01-30 ENCOUNTER — Ambulatory Visit: Payer: Managed Care, Other (non HMO) | Attending: Student

## 2023-01-30 DIAGNOSIS — R42 Dizziness and giddiness: Secondary | ICD-10-CM | POA: Insufficient documentation

## 2023-01-31 ENCOUNTER — Other Ambulatory Visit: Payer: Self-pay

## 2023-01-31 MED ORDER — WEGOVY 0.5 MG/0.5ML ~~LOC~~ SOAJ
0.5000 mg | SUBCUTANEOUS | 0 refills | Status: DC
  Filled 2023-01-31 (×2): qty 2, 28d supply, fill #0

## 2023-02-03 ENCOUNTER — Other Ambulatory Visit: Payer: Self-pay

## 2023-02-03 MED ORDER — FLUCONAZOLE 150 MG PO TABS
150.0000 mg | ORAL_TABLET | Freq: Once | ORAL | 1 refills | Status: AC
  Filled 2023-02-03: qty 1, 1d supply, fill #0

## 2023-02-03 MED ORDER — NITROFURANTOIN MONOHYD MACRO 100 MG PO CAPS
100.0000 mg | ORAL_CAPSULE | Freq: Two times a day (BID) | ORAL | 0 refills | Status: DC
  Filled 2023-02-03: qty 14, 7d supply, fill #0

## 2023-02-04 ENCOUNTER — Ambulatory Visit: Payer: Managed Care, Other (non HMO)

## 2023-02-04 ENCOUNTER — Other Ambulatory Visit: Payer: Self-pay

## 2023-02-05 ENCOUNTER — Emergency Department: Payer: Managed Care, Other (non HMO)

## 2023-02-05 ENCOUNTER — Emergency Department
Admission: EM | Admit: 2023-02-05 | Discharge: 2023-02-05 | Disposition: A | Discharge status: Home / Self Care | Payer: Managed Care, Other (non HMO) | Attending: Student in an Organized Health Care Education/Training Program | Admitting: Student in an Organized Health Care Education/Training Program

## 2023-02-05 ENCOUNTER — Other Ambulatory Visit: Payer: Self-pay

## 2023-02-05 DIAGNOSIS — R109 Unspecified abdominal pain: Secondary | ICD-10-CM | POA: Diagnosis present

## 2023-02-05 DIAGNOSIS — K219 Gastro-esophageal reflux disease without esophagitis: Secondary | ICD-10-CM | POA: Insufficient documentation

## 2023-02-05 LAB — URINALYSIS, ROUTINE W REFLEX MICROSCOPIC
Bilirubin Urine: NEGATIVE
Glucose, UA: NEGATIVE mg/dL
Hgb urine dipstick: NEGATIVE
Ketones, ur: NEGATIVE mg/dL
Leukocytes,Ua: NEGATIVE
Nitrite: NEGATIVE
Protein, ur: NEGATIVE mg/dL
Specific Gravity, Urine: 1.02 (ref 1.005–1.030)
pH: 7 (ref 5.0–8.0)

## 2023-02-05 LAB — CBC WITH DIFFERENTIAL/PLATELET
Abs Immature Granulocytes: 0.03 10*3/uL (ref 0.00–0.07)
Basophils Absolute: 0 10*3/uL (ref 0.0–0.1)
Basophils Relative: 0 %
Eosinophils Absolute: 0.1 10*3/uL (ref 0.0–0.5)
Eosinophils Relative: 1 %
HCT: 44.9 % (ref 36.0–46.0)
Hemoglobin: 14.8 g/dL (ref 12.0–15.0)
Immature Granulocytes: 0 %
Lymphocytes Relative: 20 %
Lymphs Abs: 1.8 10*3/uL (ref 0.7–4.0)
MCH: 31.2 pg (ref 26.0–34.0)
MCHC: 33 g/dL (ref 30.0–36.0)
MCV: 94.7 fL (ref 80.0–100.0)
Monocytes Absolute: 0.6 10*3/uL (ref 0.1–1.0)
Monocytes Relative: 7 %
Neutro Abs: 6.3 10*3/uL (ref 1.7–7.7)
Neutrophils Relative %: 72 %
Platelets: 217 10*3/uL (ref 150–400)
RBC: 4.74 MIL/uL (ref 3.87–5.11)
RDW: 11.9 % (ref 11.5–15.5)
WBC: 8.9 10*3/uL (ref 4.0–10.5)
nRBC: 0 % (ref 0.0–0.2)

## 2023-02-05 LAB — COMPREHENSIVE METABOLIC PANEL WITH GFR
ALT: 20 U/L (ref 0–44)
AST: 22 U/L (ref 15–41)
Albumin: 4.5 g/dL (ref 3.5–5.0)
Alkaline Phosphatase: 97 U/L (ref 38–126)
Anion gap: 9 (ref 5–15)
BUN: 11 mg/dL (ref 6–20)
CO2: 27 mmol/L (ref 22–32)
Calcium: 9.5 mg/dL (ref 8.9–10.3)
Chloride: 103 mmol/L (ref 98–111)
Creatinine, Ser: 0.86 mg/dL (ref 0.44–1.00)
GFR, Estimated: >60 mL/min (ref >60)
Glucose, Bld: 102 mg/dL — ABNORMAL HIGH (ref 70–99)
Potassium: 3.7 mmol/L (ref 3.5–5.1)
Sodium: 139 mmol/L (ref 135–145)
Total Bilirubin: 0.9 mg/dL (ref 0.3–1.2)
Total Protein: 7.8 g/dL (ref 6.5–8.1)

## 2023-02-05 LAB — LIPASE, BLOOD: Lipase: 28 U/L (ref 11–51)

## 2023-02-05 MED ORDER — SODIUM CHLORIDE 0.9 % IV BOLUS
1000.0000 mL | Freq: Once | INTRAVENOUS | Status: AC
  Administered 2023-02-05: 1000 mL via INTRAVENOUS

## 2023-02-05 MED ORDER — LOSARTAN POTASSIUM 25 MG PO TABS
25.0000 mg | ORAL_TABLET | Freq: Every day | ORAL | 1 refills | Status: DC
  Filled 2023-02-05: qty 90, 90d supply, fill #0
  Filled 2023-05-22: qty 90, 90d supply, fill #1

## 2023-02-05 MED ORDER — ONDANSETRON HCL 4 MG/2ML IJ SOLN
4.0000 mg | Freq: Once | INTRAMUSCULAR | Status: AC
  Administered 2023-02-05: 4 mg via INTRAVENOUS
  Filled 2023-02-05: qty 2

## 2023-02-05 MED ORDER — MORPHINE SULFATE (PF) 4 MG/ML IV SOLN
4.0000 mg | INTRAVENOUS | Status: DC | PRN
  Administered 2023-02-05: 4 mg via INTRAVENOUS
  Filled 2023-02-05: qty 1

## 2023-02-05 MED ORDER — IOHEXOL 300 MG/ML  SOLN
100.0000 mL | Freq: Once | INTRAMUSCULAR | Status: AC | PRN
  Administered 2023-02-05: 100 mL via INTRAVENOUS

## 2023-02-05 NOTE — ED Provider Notes (Signed)
[facility] Provider Note    Event Date/Time   First MD Initiated Contact with Patient 02/05/23 1128     (approximate)   History   Flank Pain   HPI  Donna Vasquez is a 57 y.o. female with a history of IBS, GERD, gallbladder disease status postcholecystectomy recently started on Wegovy presents to the ER for evaluation of crampy right-sided abdominal pain.  No aggravating or alleviating factors.  Does still have her appendix.  Recent placed on antibiotic for UTI denies any dysuria at this time.  No diarrhea.     Physical Exam   Triage Vital Signs: ED Triage Vitals  Encounter Vitals Group     BP 02/05/23 1043 (!) 159/93     Systolic BP Percentile --      Diastolic BP Percentile --      Pulse Rate 02/05/23 1043 85     Resp 02/05/23 1043 18     Temp 02/05/23 1043 98.8 F (37.1 C)     Temp Source 02/05/23 1043 Oral     SpO2 02/05/23 1043 98 %     Weight --      Height --      Head Circumference --      Peak Flow --      Pain Score 02/05/23 1043 8     Pain Loc --      Pain Education --      Exclude from Growth Chart --     Most recent vital signs: Vitals:   02/05/23 1043 02/05/23 1351  BP: (!) 159/93 (!) 140/88  Pulse: 85 78  Resp: 18 17  Temp: 98.8 F (37.1 C)   SpO2: 98% 99%     Constitutional: Alert  Eyes: Conjunctivae are normal.  Head: Atraumatic. Nose: No congestion/rhinnorhea. Mouth/Throat: Mucous membranes are moist.   Neck: Painless ROM.  Cardiovascular:   Good peripheral circulation. Respiratory: Normal respiratory effort.  No retractions.  Gastrointestinal: Soft mild right-sided tenderness to palpation. Musculoskeletal:  no deformity Neurologic:  MAE spontaneously. No gross focal neurologic deficits are appreciated.  Skin:  Skin is warm, dry and intact. No rash noted. Psychiatric: Mood and affect are normal. Speech and behavior are normal.    ED Results / Procedures / Treatments   Labs (all labs ordered are  listed, but only abnormal results are displayed) Labs Reviewed  URINALYSIS, ROUTINE W REFLEX MICROSCOPIC - Abnormal; Notable for the following components:      Result Value   Color, Urine YELLOW (*)    APPearance CLEAR (*)    All other components within normal limits  COMPREHENSIVE METABOLIC PANEL - Abnormal; Notable for the following components:   Glucose, Bld 102 (*)    All other components within normal limits  CBC WITH DIFFERENTIAL/PLATELET  LIPASE, BLOOD     EKG     RADIOLOGY Please see ED Course for my review and interpretation.  I personally reviewed all radiographic images ordered to evaluate for the above acute complaints and reviewed radiology reports and findings.  These findings were personally discussed with the patient.  Please see medical record for radiology report.    PROCEDURES:Procedures   MEDICATIONS ORDERED IN ED: Medications  morphine (PF) 4 MG/ML injection 4 mg (4 mg Intravenous Given 02/05/23 1148)  sodium chloride 0.9 % bolus 1,000 mL (0 mLs Intravenous Stopped 02/05/23 1300)  ondansetron (ZOFRAN) injection 4 mg (4 mg Intravenous Given 02/05/23 1148)  iohexol (OMNIPAQUE) 300 MG/ML solution 100 mL (100 mLs Intravenous  Contrast Given 02/05/23 1240)     IMPRESSION / MDM / ASSESSMENT AND PLAN / ED COURSE  I reviewed the triage vital signs and the nursing notes.                              Differential diagnosis includes, but is not limited to, appendicitis, colitis, diverticulitis, constipation, medication side effect, biliary pathology, pancreatitis, hepatitis, IBS  Patient presenting to the ER for evaluation of symptoms as described above.  Based on symptoms, risk factors and considered above differential, this presenting complaint could reflect a potentially life-threatening illness therefore the patient will be placed on continuous pulse oximetry and telemetry for monitoring.  Laboratory evaluation will be sent to evaluate for the above complaints.       Clinical Course as of 02/05/23 1430  Wed Feb 05, 2023  1305 CT imaging on my review and interpretation without evidence of perforation or abscess will await formal radiology report. [PR]  1350 LFTs are normal.  CT imaging showing dilated gallbladder but given normal LFTs likely postoperative finding. [PR]  1426 Patient reassessed and reputed on exam soft and benign.  No findings to explain patient's presenting complaint.  Question whether this could be intolerance to [facility].  This point she does appear stable and appropriate for outpatient follow-up. [PR]    Clinical Course User Index [PR] Willy Eddy, MD     FINAL CLINICAL IMPRESSION(S) / ED DIAGNOSES   Final diagnoses:  Right sided abdominal pain     Rx / DC Orders   ED Discharge Orders     None        Note:  This document was prepared using Dragon voice recognition software and may include unintentional dictation errors.    Willy Eddy, MD 02/05/23 1430

## 2023-02-05 NOTE — ED Triage Notes (Signed)
First nurse note: Pt to ED via POV from [facility]. Pt reports emesis this AM, abd distention, RLQ pain x1 day. Pt sent for rule out for appendicitis. Pt does not have gallbladder.

## 2023-02-05 NOTE — ED Triage Notes (Signed)
Pt c/o R flank pain today. Pt denies RLQ abd pain. Endorses one episode of emesis overnight. Pt recently diagnosed with UTI and started macrobid on Monday.

## 2023-02-11 ENCOUNTER — Ambulatory Visit: Payer: Managed Care, Other (non HMO)

## 2023-02-11 ENCOUNTER — Other Ambulatory Visit: Payer: Self-pay

## 2023-02-11 MED ORDER — ONDANSETRON 4 MG PO TBDP
4.0000 mg | ORAL_TABLET | ORAL | 0 refills | Status: DC
  Filled 2023-02-11: qty 4, 2d supply, fill #0

## 2023-02-11 MED ORDER — NA SULFATE-K SULFATE-MG SULF 17.5-3.13-1.6 GM/177ML PO SOLN
ORAL | 0 refills | Status: DC
  Filled 2023-02-11: qty 354, 1d supply, fill #0

## 2023-02-13 ENCOUNTER — Other Ambulatory Visit: Payer: Self-pay

## 2023-02-13 MED ORDER — WEGOVY 0.25 MG/0.5ML ~~LOC~~ SOAJ
0.2500 mg | SUBCUTANEOUS | 5 refills | Status: DC
  Filled 2023-02-13 – 2023-03-07 (×2): qty 2, 28d supply, fill #0
  Filled 2023-05-09: qty 2, 28d supply, fill #1

## 2023-02-13 MED ORDER — LACOSAMIDE 150 MG PO TABS
150.0000 mg | ORAL_TABLET | Freq: Two times a day (BID) | ORAL | 5 refills | Status: DC
  Filled 2023-02-13: qty 60, 30d supply, fill #0
  Filled 2023-03-31 – 2023-04-02 (×2): qty 60, 30d supply, fill #1
  Filled 2023-04-28 – 2023-04-30 (×2): qty 60, 30d supply, fill #2
  Filled 2023-06-03: qty 60, 30d supply, fill #3
  Filled 2023-07-23 (×2): qty 60, 30d supply, fill #4
  Filled 2023-08-18 – 2023-09-17 (×2): qty 60, 30d supply, fill #5

## 2023-02-14 ENCOUNTER — Other Ambulatory Visit: Payer: Self-pay

## 2023-02-17 ENCOUNTER — Other Ambulatory Visit: Payer: Self-pay | Admitting: Nurse Practitioner

## 2023-02-17 DIAGNOSIS — R1011 Right upper quadrant pain: Secondary | ICD-10-CM

## 2023-02-17 DIAGNOSIS — K838 Other specified diseases of biliary tract: Secondary | ICD-10-CM

## 2023-02-19 ENCOUNTER — Other Ambulatory Visit: Payer: Self-pay

## 2023-02-20 ENCOUNTER — Other Ambulatory Visit: Payer: Self-pay

## 2023-02-21 ENCOUNTER — Ambulatory Visit: Admission: RE | Admit: 2023-02-21 | Payer: Managed Care, Other (non HMO) | Source: Ambulatory Visit

## 2023-03-04 ENCOUNTER — Other Ambulatory Visit: Payer: Self-pay

## 2023-03-06 ENCOUNTER — Other Ambulatory Visit: Payer: Self-pay

## 2023-03-06 MED ORDER — CEFUROXIME AXETIL 250 MG PO TABS
250.0000 mg | ORAL_TABLET | Freq: Two times a day (BID) | ORAL | 0 refills | Status: DC
  Filled 2023-03-06: qty 14, 7d supply, fill #0

## 2023-03-07 ENCOUNTER — Other Ambulatory Visit: Payer: Self-pay

## 2023-03-14 ENCOUNTER — Other Ambulatory Visit: Payer: Self-pay

## 2023-03-14 MED ORDER — NITROFURANTOIN MONOHYD MACRO 100 MG PO CAPS
100.0000 mg | ORAL_CAPSULE | Freq: Two times a day (BID) | ORAL | 0 refills | Status: DC
  Filled 2023-03-14: qty 14, 7d supply, fill #0

## 2023-03-27 ENCOUNTER — Other Ambulatory Visit: Payer: Self-pay

## 2023-03-27 MED ORDER — DOXYCYCLINE HYCLATE 100 MG PO CAPS
100.0000 mg | ORAL_CAPSULE | Freq: Two times a day (BID) | ORAL | 0 refills | Status: DC
  Filled 2023-03-27 (×2): qty 28, 14d supply, fill #0

## 2023-03-27 MED ORDER — DOXYCYCLINE HYCLATE 100 MG PO CAPS
100.0000 mg | ORAL_CAPSULE | Freq: Two times a day (BID) | ORAL | 0 refills | Status: DC
  Filled 2023-03-27: qty 14, 7d supply, fill #0

## 2023-03-31 ENCOUNTER — Other Ambulatory Visit: Payer: Self-pay

## 2023-04-01 ENCOUNTER — Other Ambulatory Visit: Payer: Self-pay

## 2023-04-01 MED ORDER — SIMVASTATIN 20 MG PO TABS
20.0000 mg | ORAL_TABLET | Freq: Every day | ORAL | 1 refills | Status: DC
  Filled 2023-04-01: qty 90, 90d supply, fill #0
  Filled 2023-07-08: qty 90, 90d supply, fill #1

## 2023-04-01 MED ORDER — NIACIN ER (ANTIHYPERLIPIDEMIC) 1000 MG PO TBCR
1000.0000 mg | EXTENDED_RELEASE_TABLET | Freq: Every day | ORAL | 1 refills | Status: DC
  Filled 2023-04-01: qty 90, 90d supply, fill #0
  Filled 2023-07-08: qty 90, 90d supply, fill #1

## 2023-04-02 ENCOUNTER — Other Ambulatory Visit: Payer: Self-pay

## 2023-04-11 ENCOUNTER — Other Ambulatory Visit: Payer: Self-pay

## 2023-04-14 ENCOUNTER — Encounter: Payer: Self-pay | Admitting: Gastroenterology

## 2023-04-14 ENCOUNTER — Other Ambulatory Visit: Payer: Self-pay

## 2023-04-14 MED ORDER — TRAZODONE HCL 100 MG PO TABS
100.0000 mg | ORAL_TABLET | Freq: Every evening | ORAL | 0 refills | Status: DC
  Filled 2023-04-14: qty 90, 90d supply, fill #0

## 2023-04-28 ENCOUNTER — Other Ambulatory Visit: Payer: Self-pay

## 2023-04-29 ENCOUNTER — Other Ambulatory Visit: Payer: Self-pay

## 2023-04-30 ENCOUNTER — Other Ambulatory Visit: Payer: Self-pay

## 2023-05-09 ENCOUNTER — Other Ambulatory Visit: Payer: Self-pay

## 2023-05-12 ENCOUNTER — Other Ambulatory Visit: Payer: Self-pay

## 2023-05-12 ENCOUNTER — Encounter: Payer: Self-pay | Admitting: Gastroenterology

## 2023-05-13 ENCOUNTER — Other Ambulatory Visit: Payer: Self-pay

## 2023-05-13 MED ORDER — SERTRALINE HCL 100 MG PO TABS
100.0000 mg | ORAL_TABLET | Freq: Every day | ORAL | 1 refills | Status: DC
  Filled 2023-05-13: qty 90, 90d supply, fill #0
  Filled 2023-08-18 – 2023-08-29 (×2): qty 90, 90d supply, fill #1

## 2023-05-16 ENCOUNTER — Ambulatory Visit
Admission: RE | Admit: 2023-05-16 | Discharge: 2023-05-16 | Disposition: A | Discharge status: Home / Self Care | Payer: Managed Care, Other (non HMO) | Attending: Gastroenterology | Admitting: Gastroenterology

## 2023-05-16 ENCOUNTER — Encounter
Admission: RE | Disposition: A | Discharge status: Home / Self Care | Payer: Self-pay | Source: Home / Self Care | Attending: Gastroenterology

## 2023-05-16 ENCOUNTER — Ambulatory Visit: Payer: Managed Care, Other (non HMO) | Admitting: Anesthesiology

## 2023-05-16 ENCOUNTER — Encounter: Payer: Self-pay | Admitting: Gastroenterology

## 2023-05-16 DIAGNOSIS — I10 Essential (primary) hypertension: Secondary | ICD-10-CM | POA: Diagnosis not present

## 2023-05-16 DIAGNOSIS — K640 First degree hemorrhoids: Secondary | ICD-10-CM | POA: Diagnosis not present

## 2023-05-16 DIAGNOSIS — Z7985 Long-term (current) use of injectable non-insulin antidiabetic drugs: Secondary | ICD-10-CM | POA: Diagnosis not present

## 2023-05-16 DIAGNOSIS — Z9049 Acquired absence of other specified parts of digestive tract: Secondary | ICD-10-CM | POA: Diagnosis not present

## 2023-05-16 DIAGNOSIS — E66813 Obesity, class 3: Secondary | ICD-10-CM | POA: Diagnosis not present

## 2023-05-16 DIAGNOSIS — K5730 Diverticulosis of large intestine without perforation or abscess without bleeding: Secondary | ICD-10-CM | POA: Diagnosis not present

## 2023-05-16 DIAGNOSIS — G4733 Obstructive sleep apnea (adult) (pediatric): Secondary | ICD-10-CM | POA: Insufficient documentation

## 2023-05-16 DIAGNOSIS — E669 Obesity, unspecified: Secondary | ICD-10-CM | POA: Diagnosis not present

## 2023-05-16 DIAGNOSIS — G40909 Epilepsy, unspecified, not intractable, without status epilepticus: Secondary | ICD-10-CM | POA: Diagnosis not present

## 2023-05-16 DIAGNOSIS — Z83719 Family history of colon polyps, unspecified: Secondary | ICD-10-CM | POA: Diagnosis not present

## 2023-05-16 DIAGNOSIS — K5900 Constipation, unspecified: Secondary | ICD-10-CM | POA: Diagnosis not present

## 2023-05-16 DIAGNOSIS — K219 Gastro-esophageal reflux disease without esophagitis: Secondary | ICD-10-CM | POA: Diagnosis not present

## 2023-05-16 DIAGNOSIS — Z1211 Encounter for screening for malignant neoplasm of colon: Secondary | ICD-10-CM | POA: Insufficient documentation

## 2023-05-16 DIAGNOSIS — K6289 Other specified diseases of anus and rectum: Secondary | ICD-10-CM | POA: Insufficient documentation

## 2023-05-16 DIAGNOSIS — F32A Depression, unspecified: Secondary | ICD-10-CM | POA: Insufficient documentation

## 2023-05-16 DIAGNOSIS — K582 Mixed irritable bowel syndrome: Secondary | ICD-10-CM | POA: Diagnosis not present

## 2023-05-16 HISTORY — DX: Epilepsy, unspecified, not intractable, without status epilepticus: G40.909

## 2023-05-16 HISTORY — DX: Plantar fascial fibromatosis: M72.2

## 2023-05-16 HISTORY — DX: Sleep apnea, unspecified: G47.30

## 2023-05-16 HISTORY — PX: COLONOSCOPY WITH PROPOFOL: SHX5780

## 2023-05-16 HISTORY — DX: Palpitations: R00.2

## 2023-05-16 HISTORY — DX: Rheumatoid arthritis, unspecified: M06.9

## 2023-05-16 HISTORY — DX: Low back pain, unspecified: M54.50

## 2023-05-16 HISTORY — DX: Anxiety disorder, unspecified: F41.9

## 2023-05-16 HISTORY — DX: Gastro-esophageal reflux disease without esophagitis: K21.9

## 2023-05-16 HISTORY — DX: Preglaucoma, unspecified, unspecified eye: H40.009

## 2023-05-16 SURGERY — COLONOSCOPY WITH PROPOFOL
Anesthesia: General

## 2023-05-16 MED ORDER — LIDOCAINE HCL (CARDIAC) PF 100 MG/5ML IV SOSY
PREFILLED_SYRINGE | INTRAVENOUS | Status: DC | PRN
  Administered 2023-05-16: 10 mg via INTRAVENOUS

## 2023-05-16 MED ORDER — PROPOFOL 500 MG/50ML IV EMUL
INTRAVENOUS | Status: DC | PRN
  Administered 2023-05-16: 125 ug/kg/min via INTRAVENOUS

## 2023-05-16 MED ORDER — SODIUM CHLORIDE 0.9 % IV SOLN: INTRAVENOUS | Status: DC | PRN

## 2023-05-16 MED ORDER — STERILE WATER FOR IRRIGATION IR SOLN
Status: DC | PRN
  Administered 2023-05-16: 240 mL

## 2023-05-16 MED ORDER — PROPOFOL 10 MG/ML IV BOLUS
INTRAVENOUS | Status: DC | PRN
  Administered 2023-05-16: 50 mg via INTRAVENOUS
  Administered 2023-05-16: 100 mg via INTRAVENOUS

## 2023-05-16 NOTE — Op Note (Signed)
[name] Patient Name: Donna Vasquez Procedure Date: 05/16/2023 8:40 AM MRN: [id number] Account #: [id number] Date of Birth: 11/21/1965 Admit Type: Outpatient Age: 57 Room: [facility] ENDO ROOM 1 Gender: Female Note Status: Finalized Instrument Name: Prentice Docker [id number] Procedure:             Colonoscopy Indications:           Colon cancer screening in patient at increased risk:                         Family history of 1st-degree relative with colon polyps Providers:             Eather Colas MD, MD Referring MD:          No Local Md, MD (Referring MD) Medicines:             Monitored Anesthesia Care Complications:         No immediate complications. Procedure:             Pre-Anesthesia Assessment:                        - Prior to the procedure, a History and Physical was                         performed, and patient medications and allergies were                         reviewed. The patient is competent. The risks and                         benefits of the procedure and the sedation options and                         risks were discussed with the patient. All questions                         were answered and informed consent was obtained.                         Patient identification and proposed procedure were                         verified by the physician, the nurse, the                         anesthesiologist, the anesthetist and the technician                         in the endoscopy suite. Mental Status Examination:                         alert and oriented. Airway Examination: normal                         oropharyngeal airway and neck mobility. Respiratory                         Examination: clear to auscultation. CV Examination:  normal. Prophylactic Antibiotics: The patient does not                         require prophylactic antibiotics. Prior                         Anticoagulants: The  patient has taken no anticoagulant                         or antiplatelet agents. ASA Grade Assessment: III - A                         patient with severe systemic disease. After reviewing                         the risks and benefits, the patient was deemed in                         satisfactory condition to undergo the procedure. The                         anesthesia plan was to use monitored anesthesia care                         (MAC). Immediately prior to administration of                         medications, the patient was re-assessed for adequacy                         to receive sedatives. The heart rate, respiratory                         rate, oxygen saturations, blood pressure, adequacy of                         pulmonary ventilation, and response to care were                         monitored throughout the procedure. The physical                         status of the patient was re-assessed after the                         procedure.                        After obtaining informed consent, the colonoscope was                         passed under direct vision. Throughout the procedure,                         the patient's blood pressure, pulse, and oxygen                         saturations were monitored continuously. The  Colonoscope was introduced through the anus and                         advanced to the the terminal ileum, with                         identification of the appendiceal orifice and IC                         valve. The colonoscopy was performed without                         difficulty. The patient tolerated the procedure well.                         The quality of the bowel preparation was good. The                         terminal ileum, ileocecal valve, appendiceal orifice,                         and rectum were photographed. Findings:      The perianal and digital rectal examinations were normal.      The  terminal ileum appeared normal.      A single large-mouthed diverticulum was found in the cecum.      A few small-mouthed diverticula were found in the sigmoid colon.      Anal papilla(e) were hypertrophied.      Non-bleeding internal hemorrhoids were found during retroflexion. The       hemorrhoids were Grade I (internal hemorrhoids that do not prolapse).      The exam was otherwise without abnormality on direct and retroflexion       views. Impression:            - The examined portion of the ileum was normal.                        - Diverticulosis in the cecum.                        - Diverticulosis in the sigmoid colon.                        - Anal papilla(e) were hypertrophied.                        - Non-bleeding internal hemorrhoids.                        - The examination was otherwise normal on direct and                         retroflexion views.                        - No specimens collected. Recommendation:        - Discharge patient to home.                        - Resume previous diet.                        -  Continue present medications.                        - Repeat colonoscopy in 10 years for screening                         purposes.                        - Return to referring physician as previously                         scheduled. Procedure Code(s):     --- Professional ---                        Z6109, Colorectal cancer screening; colonoscopy on                         individual at high risk Diagnosis Code(s):     --- Professional ---                        Z83.71, Family history of colonic polyps                        K64.0, First degree hemorrhoids                        K62.89, Other specified diseases of anus and rectum                        K57.30, Diverticulosis of large intestine without                         perforation or abscess without bleeding CPT copyright 2022 American Medical Association. All rights reserved. The codes  documented in this report are preliminary and upon coder review may  be revised to meet current compliance requirements. Eather Colas MD, MD 05/16/2023 9:04:05 AM Number of Addenda: 0 Note Initiated On: 05/16/2023 8:40 AM Scope Withdrawal Time: 0 hours 9 minutes 34 seconds  Total Procedure Duration: 0 hours 13 minutes 39 seconds  Estimated Blood Loss:  Estimated blood loss: none.      [facility]

## 2023-05-16 NOTE — Anesthesia Postprocedure Evaluation (Signed)
Anesthesia Post Note  Patient: Donna Vasquez  Procedure(s) Performed: COLONOSCOPY WITH PROPOFOL  Patient location during evaluation: PACU Anesthesia Type: General Level of consciousness: awake Pain management: satisfactory to patient Vital Signs Assessment: post-procedure vital signs reviewed and stable Respiratory status: spontaneous breathing and nonlabored ventilation Cardiovascular status: blood pressure returned to baseline Anesthetic complications: no   No notable events documented.   Last Vitals:  Vitals:   05/16/23 0912 05/16/23 0922  BP: 131/77 136/85  Pulse:    Resp: 16 16  Temp:    SpO2:      Last Pain:  Vitals:   05/16/23 0922  TempSrc:   PainSc: 0-No pain                 VAN STAVEREN,Tahira Olivarez

## 2023-05-16 NOTE — Interval H&P Note (Signed)
History and Physical Interval Note:  05/16/2023 8:39 AM  Donna Vasquez  has presented today for surgery, with the diagnosis of Z83.719  - FH: colon polyps K58.2 - Irritable bowel syndrome with both constipation and diarrhea.  The various methods of treatment have been discussed with the patient and family. After consideration of risks, benefits and other options for treatment, the patient has consented to  Procedure(s): COLONOSCOPY WITH PROPOFOL (N/A) as a surgical intervention.  The patient's history has been reviewed, patient examined, no change in status, stable for surgery.  I have reviewed the patient's chart and labs.  Questions were answered to the patient's satisfaction.     Regis Bill  Ok to proceed with colonoscopy

## 2023-05-16 NOTE — H&P (Signed)
Outpatient short stay form Pre-procedure 05/16/2023  Regis Bill, MD  Primary Physician: Alm Bustard, NP  Reason for visit:  Family history of colon polyps  History of present illness:    57 y/o lady with history of hypertension, obesity, OSA, and IBS here for colonoscopy for family history of colon polyps in Father of unknown size/type. Has had recent flare of RUQ pain likely from wegovy. No blood thinners except aspirin. No family history of GI malignancies. History of cholecystectomy.   No current facility-administered medications for this encounter.  Medications Prior to Admission  Medication Sig Dispense Refill Last Dose/Taking   albuterol (PROAIR HFA) 108 (90 Base) MCG/ACT inhaler Inhale 1-2 puffs into the lungs every 6 (six) hours as needed for wheezing or shortness of breath. 8.7 g 2 Past Week   aspirin 81 MG tablet Take 81 mg by mouth daily.   Past Week   cholecalciferol (VITAMIN D3) 10 MCG (400 UNIT) TABS tablet Take 400 Units by mouth daily.   Taking   cyanocobalamin (VITAMIN B12) 1000 MCG tablet Take 1,000 mcg by mouth daily.   Taking   guanFACINE (INTUNIV) 2 MG TB24 ER tablet Take 1 tablet (2 mg total) by mouth daily. 90 tablet 0 Past Month   Lacosamide 150 MG TABS Take 1 tablet (150 mg total) by mouth 2 (two) times daily. 60 tablet 5 05/15/2023   lisdexamfetamine (VYVANSE) 70 MG capsule Take one capsule (70mg ) by mouth each morning. 30 capsule 0 05/15/2023   losartan (COZAAR) 25 MG tablet Take 1 tablet (25 mg total) by mouth daily. 90 tablet 1 Past Week   niacin (NIASPAN) 1000 MG CR tablet TAKE 1 TABLET(1000 MG) BY MOUTH AT BEDTIME 90 tablet 0 Past Week   ondansetron (ZOFRAN) 4 MG tablet Take 4 mg by mouth every 8 (eight) hours as needed for nausea or vomiting.   05/15/2023   simvastatin (ZOCOR) 20 MG tablet Take 1 tablet (20 mg total) by mouth at bedtime. 90 tablet 1 Past Week   albuterol (VENTOLIN HFA) 108 (90 Base) MCG/ACT inhaler Inhale 2 inhalations into the  lungs every 4 (four) hours as needed for Wheezing 6.7 g 3    cefUROXime (CEFTIN) 250 MG tablet Take 1 tablet (250 mg total) by mouth every 12 (twelve) hours for 7 days. (Patient not taking: Reported on 05/16/2023) 14 tablet 0 Completed Course   doxycycline (VIBRAMYCIN) 100 MG capsule Take 1 capsule (100 mg total) by mouth 2 (two) times daily. (Patient not taking: Reported on 05/16/2023) 28 capsule 0 Completed Course   fluticasone-salmeterol (ADVAIR DISKUS) 100-50 MCG/ACT AEPB USE 1 INHALATION BY MOUTH  TWICE DAILY 180 each 1    guanFACINE (INTUNIV) 2 MG TB24 ER tablet TAKE 1 TABLET BY MOUTH DAILY 90 tablet 0    guanFACINE (TENEX) 1 MG tablet Take by mouth. (Patient not taking: Reported on 05/16/2023)   Not Taking   Lacosamide 150 MG TABS TAKE 1 TABLET(150 MG) BY MOUTH IN THE MORNING AND AT BEDTIME 180 tablet 0    lisdexamfetamine (VYVANSE) 60 MG capsule Take 60 mg by mouth every morning.      lisdexamfetamine (VYVANSE) 70 MG capsule Take 1 capsule (70 mg total) by mouth every morning. 30 capsule 0    lisdexamfetamine (VYVANSE) 70 MG capsule Take 1 capsule (70 mg total) by mouth every morning. 30 capsule 0    lisdexamfetamine (VYVANSE) 70 MG capsule Take 1 capsule (70 mg total) by mouth every morning. 30 capsule 0  lisdexamfetamine (VYVANSE) 70 MG capsule Take 1 capsule (70 mg total) by mouth every morning. 30 capsule 0    lisdexamfetamine (VYVANSE) 70 MG capsule Take 1 capsule (70 mg total) by mouth every morning. 30 capsule 0    lisdexamfetamine (VYVANSE) 70 MG capsule Take 1 capsule (70 mg total) by mouth every morning. 30 capsule 0    lisdexamfetamine (VYVANSE) 70 MG capsule Take 1 capsule (70 mg total) by mouth every morning. 30 capsule 0    lisdexamfetamine (VYVANSE) 70 MG capsule Take 1 capsule (70 mg total) by mouth every morning. 30 capsule 0    losartan (COZAAR) 25 MG tablet TAKE 1 TABLET(25 MG) BY MOUTH DAILY 90 tablet 0    losartan (COZAAR) 25 MG tablet Take 1 tablet (25 mg total) by  mouth daily. 90 tablet 1    montelukast (SINGULAIR) 10 MG tablet TAKE 1 TABLET BY MOUTH AT  BEDTIME (Patient not taking: Reported on 05/16/2023) 90 tablet 3 Not Taking   montelukast (SINGULAIR) 10 MG tablet Take 1 tablet (10 mg total) by mouth daily. 30 tablet 5    Na Sulfate-K Sulfate-Mg Sulf 17.5-3.13-1.6 GM/177ML SOLN Take 1 Bottle by mouth as directed One kit contains 2 bottles.  Take both bottles at the times instructed by your provider. 354 mL 0    niacin (NIASPAN) 1000 MG CR tablet Take 1 tablet (1,000 mg total) by mouth at bedtime. 90 tablet 1    nitrofurantoin, macrocrystal-monohydrate, (MACROBID) 100 MG capsule Take 1 capsule (100 mg total) by mouth every 12 (twelve) hours for 7 days. 14 capsule 0    ondansetron (ZOFRAN-ODT) 4 MG disintegrating tablet Take 1 tablet (4 mg total) by mouth under tongue one hour before the bowel prep. May repeat in 8 hours if needed for nausea/vomiting. 4 tablet 0    Semaglutide-Weight Management (WEGOVY) 0.25 MG/0.5ML SOAJ Inject 0.25 mg into the skin once a week. 2 mL 5 03/06/2023   sertraline (ZOLOFT) 100 MG tablet TAKE 1 TABLET BY MOUTH  DAILY 90 tablet 3    sertraline (ZOLOFT) 100 MG tablet Take one tablet by mouth once daily 90 tablet 0    sertraline (ZOLOFT) 100 MG tablet Take 1 tablet (100 mg total) by mouth daily. 90 tablet 1    simvastatin (ZOCOR) 20 MG tablet TAKE 1 TABLET(20 MG) BY MOUTH EVERY EVENING 90 tablet 0    simvastatin (ZOCOR) 20 MG tablet Take 1 tablet (20 mg total) by mouth at bedtime. 90 tablet 1    traZODone (DESYREL) 100 MG tablet Take 1 tablet (100 mg total) by mouth at bedtime. 90 tablet 0    traZODone (DESYREL) 50 MG tablet TAKE 1 TABLET BY MOUTH ONCE DAILY 90 tablet 0      Allergies  Allergen Reactions   Flonase [Fluticasone Propionate] Other (See Comments)    Nasal irritation, burning, but can tolerate advair inhaler.     Sulfa Antibiotics Hives   Wound Dressing Adhesive Itching     Past Medical History:  Diagnosis  Date   Allergy    Anxiety    Asthma    Back pain    Bulging lumbar disc    Chicken pox    Constipation    Depression    Epilepsy (HCC)    Fatigue    Frequent headaches    Gall bladder disease    GERD (gastroesophageal reflux disease)    Glaucoma suspect    Headache    Heartburn    Hyperlipidemia    IBS (  irritable bowel syndrome)    Interstitial cystitis    Lumbago    Palpitations    Plantar fasciitis    RA (rheumatoid arthritis) (HCC)    Rheumatoid arthritis (HCC)    Shortness of breath on exertion    Sleep apnea    Staring episodes    Trouble in sleeping    Urine incontinence    Vertigo    Vision changes    Vitamin D deficiency     Review of systems:  Otherwise negative.    Physical Exam  Gen: Alert, oriented. Appears stated age.  HEENT: PERRLA. Lungs: No respiratory distress CV: RRR Abd: soft, benign, no masses Ext: No edema    Planned procedures: Proceed with colonoscopy. The patient understands the nature of the planned procedure, indications, risks, alternatives and potential complications including but not limited to bleeding, infection, perforation, damage to internal organs and possible oversedation/side effects from anesthesia. The patient agrees and gives consent to proceed.  Please refer to procedure notes for findings, recommendations and patient disposition/instructions.     Regis Bill, MD [facility]

## 2023-05-16 NOTE — Anesthesia Preprocedure Evaluation (Signed)
Anesthesia Evaluation  Patient identified by MRN, date of birth, ID band Patient awake    Reviewed: Allergy & Precautions, NPO status , Patient's Chart, lab work & pertinent test results  Airway Mallampati: II  TM Distance: >3 FB Neck ROM: full    Dental  (+) Teeth Intact   Pulmonary neg pulmonary ROS, asthma , sleep apnea and Continuous Positive Airway Pressure Ventilation    Pulmonary exam normal breath sounds clear to auscultation       Cardiovascular Exercise Tolerance: Good hypertension, negative cardio ROS Normal cardiovascular exam Rhythm:Regular Rate:Normal     Neuro/Psych Seizures -,    Depression    negative neurological ROS  negative psych ROS   GI/Hepatic negative GI ROS, Neg liver ROS,GERD  Medicated,,  Endo/Other  negative endocrine ROS  Class 3 obesity  Renal/GU negative Renal ROS  negative genitourinary   Musculoskeletal  (+) Arthritis ,    Abdominal  (+) + obese  Peds negative pediatric ROS (+)  Hematology negative hematology ROS (+)   Anesthesia Other Findings Past Medical History: No date: Allergy No date: Anxiety No date: Asthma No date: Back pain No date: Bulging lumbar disc No date: Chicken pox No date: Constipation No date: Depression No date: Epilepsy (HCC) No date: Fatigue No date: Frequent headaches No date: Gall bladder disease No date: GERD (gastroesophageal reflux disease) No date: Glaucoma suspect No date: Headache No date: Heartburn No date: Hyperlipidemia No date: IBS (irritable bowel syndrome) No date: Interstitial cystitis No date: Lumbago No date: Palpitations No date: Plantar fasciitis No date: RA (rheumatoid arthritis) (HCC) No date: Rheumatoid arthritis (HCC) No date: Shortness of breath on exertion No date: Sleep apnea No date: Staring episodes No date: Trouble in sleeping No date: Urine incontinence No date: Vertigo No date: Vision changes No date:  Vitamin D deficiency  Past Surgical History: No date: CHOLECYSTECTOMY No date: cyst removed from back No date: NASAL SINUS SURGERY 1999: TUBAL LIGATION     Reproductive/Obstetrics negative OB ROS                             Anesthesia Physical Anesthesia Plan  ASA: 3  Anesthesia Plan: General   Post-op Pain Management:    Induction: Intravenous  PONV Risk Score and Plan: Propofol infusion and TIVA  Airway Management Planned: Natural Airway and Nasal Cannula  Additional Equipment:   Intra-op Plan:   Post-operative Plan:   Informed Consent: I have reviewed the patients History and Physical, chart, labs and discussed the procedure including the risks, benefits and alternatives for the proposed anesthesia with the patient or authorized representative who has indicated his/her understanding and acceptance.     Dental Advisory Given  Plan Discussed with: CRNA and Surgeon  Anesthesia Plan Comments:        Anesthesia Quick Evaluation

## 2023-05-16 NOTE — Transfer of Care (Signed)
Immediate Anesthesia Transfer of Care Note  Patient: Donna Vasquez  Procedure(s) Performed: COLONOSCOPY WITH PROPOFOL  Patient Location: PACU  Anesthesia Type:General  Level of Consciousness: drowsy  Airway & Oxygen Therapy: Patient Spontanous Breathing  Post-op Assessment: Report given to RN and Post -op Vital signs reviewed and stable  Post vital signs: Reviewed and stable  Last Vitals:  Vitals Value Taken Time  BP 120/68 05/16/23 0902  Temp 35.9 C 05/16/23 0902  Pulse 72 05/16/23 0904  Resp 16 05/16/23 0902  SpO2 99 % 05/16/23 0904  Vitals shown include unfiled device data.  Last Pain:  Vitals:   05/16/23 0902  TempSrc: Temporal         Complications: No notable events documented.

## 2023-05-19 ENCOUNTER — Encounter: Payer: Self-pay | Admitting: Gastroenterology

## 2023-05-22 ENCOUNTER — Other Ambulatory Visit: Payer: Self-pay

## 2023-06-03 ENCOUNTER — Other Ambulatory Visit: Payer: Self-pay

## 2023-06-04 ENCOUNTER — Other Ambulatory Visit: Payer: Self-pay

## 2023-06-05 ENCOUNTER — Other Ambulatory Visit: Payer: Self-pay

## 2023-06-17 ENCOUNTER — Encounter: Payer: Self-pay | Admitting: Urology

## 2023-06-19 ENCOUNTER — Other Ambulatory Visit: Payer: Self-pay

## 2023-07-03 ENCOUNTER — Other Ambulatory Visit: Payer: Self-pay

## 2023-07-03 MED ORDER — ESTRADIOL 0.1 MG/GM VA CREA
1.0000 g | TOPICAL_CREAM | VAGINAL | 1 refills | Status: DC
Start: 2023-07-03 — End: 2024-02-12
  Filled 2023-07-03: qty 42.5, 30d supply, fill #0

## 2023-07-07 ENCOUNTER — Other Ambulatory Visit: Payer: Self-pay

## 2023-07-07 MED ORDER — NITROFURANTOIN MONOHYD MACRO 100 MG PO CAPS
100.0000 mg | ORAL_CAPSULE | Freq: Two times a day (BID) | ORAL | 0 refills | Status: AC
Start: 2023-07-07 — End: 2023-07-13
  Filled 2023-07-07: qty 10, 5d supply, fill #0

## 2023-07-08 ENCOUNTER — Other Ambulatory Visit: Payer: Self-pay

## 2023-07-18 ENCOUNTER — Other Ambulatory Visit: Payer: Self-pay

## 2023-07-18 MED ORDER — CEPHALEXIN 500 MG PO CAPS
500.0000 mg | ORAL_CAPSULE | Freq: Two times a day (BID) | ORAL | 0 refills | Status: DC
  Filled 2023-07-18: qty 14, 7d supply, fill #0

## 2023-07-23 ENCOUNTER — Other Ambulatory Visit: Payer: Self-pay

## 2023-07-24 ENCOUNTER — Other Ambulatory Visit: Payer: Self-pay

## 2023-07-24 MED ORDER — LISDEXAMFETAMINE DIMESYLATE 70 MG PO CAPS
70.0000 mg | ORAL_CAPSULE | Freq: Every morning | ORAL | 0 refills | Status: DC
  Filled 2023-07-24: qty 30, 30d supply, fill #0

## 2023-07-28 ENCOUNTER — Other Ambulatory Visit: Payer: Self-pay | Admitting: Obstetrics and Gynecology

## 2023-07-28 ENCOUNTER — Other Ambulatory Visit: Payer: Self-pay

## 2023-07-28 DIAGNOSIS — Z01818 Encounter for other preprocedural examination: Secondary | ICD-10-CM

## 2023-07-28 MED ORDER — ONDANSETRON HCL 4 MG PO TABS
4.0000 mg | ORAL_TABLET | Freq: Three times a day (TID) | ORAL | 0 refills | Status: DC | PRN
  Filled 2023-07-28: qty 20, 7d supply, fill #0

## 2023-07-28 NOTE — Progress Notes (Signed)
 GYN H&P   CC: pre-op   HPI: Donna Vasquez 58 y.o. [id number] with a hx of obesity, HTN, OSA, asthma, HLD, anxiety, depression, epilepsy, JRA, IBS, recurrent UTIs, urinary incontinence, and interstitial cystitis who presents for follow-up after colposcopy and for a pre-op visit.  Wants to proceed with hysteroscopy.   Pap/ Dysplasia History: - 10/17/17 NILM with neg HRHPV - 07/03/23 AGC with neg HRHPV - 07/18/23 colpo CIN1, ECC benign, EMB benign endometrial polyp and proliferative endometrium  Reports on Friday had menstrual type cramping wrapping around to back and up to mid back. The back pain resolved by the next morning. Continuing to have menstrual cramping with low back pain. Denies vaginal bleeding. Reports yellow vaginal discharge. Denies dysuria, hematuria, fevers/ chills.   Denies any symptoms of UTI today. Reports has not had symptoms of UTI with any of these urine cultures recently. When she has symptoms it is bladder spasms and suprapubic pain.   Ucx: 03/05/23:  >100K E coli > rx cefuroxime 03/27/23: 50-100K E coli > rx doxy 04/16/23: Mixed urogenital flora 07/03/23 E coli 50-100K> nitrofurantoin 07/14/23 E coli 10-25K > cephalexin  PCP: GLENDA LYNN FIELDS, NP   ROS: All other systems reviewed and negative   PMHx: Past Medical History:  Diagnosis Date   ABDOMINAL PAIN    Allergic rhinitis, cause unspecified    Anxiety    Asthma, mild intermittent (HHS-HCC)    Depression    Diverticulitis 05/16/2023   Dr.Locklear found a couple spots, no problems with   Epilepsy (CMS/HHS-HCC) 2021   Hyperlipidemia    Hypertension    Interstitial cystitis    Other malaise and fatigue    PCOS (polycystic ovarian syndrome)    POSSIBLE   Rheumatoid arthritis (CMS/HHS-HCC) Age 46   Juvenile Rheumatoid   VITAMIN D DEFICIENCY       PSHx: Past Surgical History:  Procedure Laterality Date   TUBAL LIGATION  1999   COLONOSCOPY N/A 04/25/2006   Dr. Maggie Font @ [facility] - Hyperplastic  Polyp   SINUS SURGERY  2010   CHOLECYSTECTOMY  2010   COLONOSCOPY N/A 12/12/2015   Dr. Maggie Font @ [facility] - Int. Hemorrhoids, FHP(f)(s)   (06/29/21 Recall return.awb)   Colon @ [facility]  05/16/2023   Colonoscopy unremarkable. Repeat in 10 years/CTL   Cyst Removed from Back       OBHx: OB History  Gravida Para Term Preterm AB Living  2 2 2     2   SAB IAB Ectopic Molar Multiple Live Births            2    # Outcome Date GA Lbr Len/2nd Weight Sex Type Anes PTL Lv  2 Term      Vag-Spont   LIV  1 Term      Vag-Spont   LIV     GYN Hx: - LMP: No LMP recorded (lmp unknown). Patient is postmenopausal.  - Menopause: early 44s, never been on HRT, 1 episode of spotting last year - Pap hx: Denies any history of abnormals, last 10/17/17 NILM with neg HRHPV. Never had any excisional procedures - STI hx: Denies any history of STIs  - Sexual preference: Sexually active with husband - Dyspareunia or sexual concerns: yes- vaginal dryness - Abdominal surgeries: chole, BTL - GYN procedures: none   FHx: Denies FHx of ovarian, uterine, cervical, and colon cancer Paternal aunt- breast cancer   Meds: Current Outpatient Medications on File Prior to Visit  Medication Sig Dispense Refill  albuterol MDI, PROVENTIL, VENTOLIN, PROAIR, HFA 90 mcg/actuation inhaler Inhale 2 inhalations into the lungs every 4 (four) hours as needed for Wheezing 1 each 3   aspirin 81 MG EC tablet Take 81 mg by mouth once daily     cholecalciferol, vitamin D3, (VITAMIN D3) 125 mcg (5,000 unit) tablet Take 10,000 Units by mouth once daily     cyanocobalamin, vitamin B-12, (VITAMIN B-12 ORAL) Take by mouth     estradioL (ESTRACE) 0.01 % (0.1 mg/gram) vaginal cream Place 1 g vaginally twice a week for 90 days Insert 1/4 applicator twice weekly 42.5 g 1   lacosamide (VIMPAT) 150 mg tablet Take 1 tablet (150 mg total) by mouth 2 (two) times daily for 180 days 60 tablet 5   losartan (COZAAR) 25 MG tablet Take 1 tablet (25 mg total)  by mouth daily. 90 tablet 1   montelukast (SINGULAIR) 10 mg tablet Take 1 tablet (10 mg total) by mouth once daily 30 tablet 5   niacin (NIASPAN) 1000 MG ER tablet Take 1 tablet (1,000 mg total) by mouth at bedtime. 90 tablet 1   sertraline (ZOLOFT) 100 MG tablet Take one tablet by mouth once daily 90 tablet 1   simvastatin (ZOCOR) 20 MG tablet Take 1 tablet (20 mg total) by mouth at bedtime. 90 tablet 1   traZODone (DESYREL) 100 MG tablet Take 1 tablet by mouth once nightly. 90 tablet 0   lisdexamfetamine (VYVANSE) 70 MG capsule Take 1 capsule (70 mg total) by mouth every morning for 30 days 30 capsule 0   ondansetron (ZOFRAN-ODT) 4 MG disintegrating tablet Take one tablet under tongue one hour before the bowel prep. May repeat in 8 hours if needed for nausea/vomiting. (Patient not taking: Reported on 07/03/2023) 4 tablet 0   semaglutide (WEGOVY) 0.25 mg/0.5 mL pen injector Inject 0.5 mLs (0.25 mg total) subcutaneously once a week (Patient not taking: Reported on 07/28/2023) 2 mL 5   sodium, potassium, and magnesium (SUPREP) oral solution Take 1 Bottle by mouth as directed One kit contains 2 bottles.  Take both bottles at the times instructed by your provider. (Patient not taking: Reported on 07/03/2023) 354 mL 0   No current facility-administered medications on file prior to visit.     Allergies: Allergies  Allergen Reactions   Adhesive Itching    Itching with certain band-aids with prolonged exposure    Fluticasone Propionate Other (See Comments)    Nasal irritation, burning, but can tolerate advair inhaler.   NOT ALLERGIC   Sulfa (Sulfonamide Antibiotics) Hives     SocHx: Social History   Tobacco Use   Smoking status: Never    Passive exposure: Never   Smokeless tobacco: Never  Vaping Use   Vaping status: Never Used  Substance Use Topics   Alcohol use: No   Drug use: No     OBJECTIVE: BP 102/65   Pulse 80   Ht 170.2 cm (5\' 7" )   Wt (!) 111.9 kg (246 lb 12.8 oz)   LMP   (LMP Unknown) Comment: 2 times yearly  BMI 38.65 kg/m    Gen: NAD HEENT: [place]/AT Heart: Regular rate Lungs: Normal work of breathing Ext: No BLE edema   ASSESSMENT/PLAN: Donna Vasquez 58 y.o. [id number] with a hx of obesity, HTN, OSA, asthma, HLD, anxiety, depression, epilepsy, JRA, IBS, recurrent UTIs, urinary incontinence, and interstitial cystitis who presents for follow-up after colposcopy.  #Abnormal pap - Pap/ Dysplasia History: 10/17/17 NILM with neg HRHPV 07/03/23 AGC with neg  HRHPV 07/18/23 colpo CIN1, ECC benign, EMB benign endometrial polyp and proliferative endometrium Per [facility], repeat pap in 1 year- January 2026  #Endometrial polyp - EMB 07/18/23 showed endometrial polyp and proliferative endometrium - Discussed option of pelvic u/s versus SIS vs hysteroscopy and patient desires to proceed directly to hysteroscopy - Plan for hysteroscopy, D&C, and Myosure polypectomy for polyp removal.  - Discussed planned procedure, risks, and benefits. Risks such as infection, bleeding, organ damage (including uterine perforation), anesthesia complications, and need for possible blood products were discussed; patient will accept products in case of an emergency. We discussed the risks of a blood transfusion, such as a 1 in 1.2-1.4 million chance of contracting HIV, Hep C. Discussed possibility of needing to perform a laparoscopic procedure in the case of uterine perforation.   - Surgical and blood consent signed - Pre-op orders placed (no antibiotics indicated) - Rx tylenol, ibuprofen, and zofran PRN. Sent to pharmacy.    Shyann Hefner Roda Shutters, MD

## 2023-07-28 NOTE — H&P (View-Only) (Signed)
 GYN H&P   CC: pre-op   HPI: Donna Vasquez 58 y.o. [id number] with a hx of obesity, HTN, OSA, asthma, HLD, anxiety, depression, epilepsy, JRA, IBS, recurrent UTIs, urinary incontinence, and interstitial cystitis who presents for follow-up after colposcopy and for a pre-op visit.  Wants to proceed with hysteroscopy.   Pap/ Dysplasia History: - 10/17/17 NILM with neg HRHPV - 07/03/23 AGC with neg HRHPV - 07/18/23 colpo CIN1, ECC benign, EMB benign endometrial polyp and proliferative endometrium  Reports on Friday had menstrual type cramping wrapping around to back and up to mid back. The back pain resolved by the next morning. Continuing to have menstrual cramping with low back pain. Denies vaginal bleeding. Reports yellow vaginal discharge. Denies dysuria, hematuria, fevers/ chills.   Denies any symptoms of UTI today. Reports has not had symptoms of UTI with any of these urine cultures recently. When she has symptoms it is bladder spasms and suprapubic pain.   Ucx: 03/05/23:  >100K E coli > rx cefuroxime 03/27/23: 50-100K E coli > rx doxy 04/16/23: Mixed urogenital flora 07/03/23 E coli 50-100K> nitrofurantoin 07/14/23 E coli 10-25K > cephalexin  PCP: Donna LYNN FIELDS, NP   ROS: All other systems reviewed and negative   PMHx: Past Medical History:  Diagnosis Date   ABDOMINAL PAIN    Allergic rhinitis, cause unspecified    Anxiety    Asthma, mild intermittent (HHS-HCC)    Depression    Diverticulitis 05/16/2023   Dr.Locklear found a couple spots, no problems with   Epilepsy (CMS/HHS-HCC) 2021   Hyperlipidemia    Hypertension    Interstitial cystitis    Other malaise and fatigue    PCOS (polycystic ovarian syndrome)    POSSIBLE   Rheumatoid arthritis (CMS/HHS-HCC) Age 46   Juvenile Rheumatoid   VITAMIN D DEFICIENCY       PSHx: Past Surgical History:  Procedure Laterality Date   TUBAL LIGATION  1999   COLONOSCOPY N/A 04/25/2006   Dr. Maggie Font @ [facility] - Hyperplastic  Polyp   SINUS SURGERY  2010   CHOLECYSTECTOMY  2010   COLONOSCOPY N/A 12/12/2015   Dr. Maggie Font @ [facility] - Int. Hemorrhoids, FHP(f)(s)   (06/29/21 Recall return.awb)   Colon @ [facility]  05/16/2023   Colonoscopy unremarkable. Repeat in 10 years/CTL   Cyst Removed from Back       OBHx: OB History  Gravida Para Term Preterm AB Living  2 2 2     2   SAB IAB Ectopic Molar Multiple Live Births            2    # Outcome Date GA Lbr Len/2nd Weight Sex Type Anes PTL Lv  2 Term      Vag-Spont   LIV  1 Term      Vag-Spont   LIV     GYN Hx: - LMP: No LMP recorded (lmp unknown). Patient is postmenopausal.  - Menopause: early 44s, never been on HRT, 1 episode of spotting last year - Pap hx: Denies any history of abnormals, last 10/17/17 NILM with neg HRHPV. Never had any excisional procedures - STI hx: Denies any history of STIs  - Sexual preference: Sexually active with husband - Dyspareunia or sexual concerns: yes- vaginal dryness - Abdominal surgeries: chole, BTL - GYN procedures: none   FHx: Denies FHx of ovarian, uterine, cervical, and colon cancer Paternal aunt- breast cancer   Meds: Current Outpatient Medications on File Prior to Visit  Medication Sig Dispense Refill  albuterol MDI, PROVENTIL, VENTOLIN, PROAIR, HFA 90 mcg/actuation inhaler Inhale 2 inhalations into the lungs every 4 (four) hours as needed for Wheezing 1 each 3   aspirin 81 MG EC tablet Take 81 mg by mouth once daily     cholecalciferol, vitamin D3, (VITAMIN D3) 125 mcg (5,000 unit) tablet Take 10,000 Units by mouth once daily     cyanocobalamin, vitamin B-12, (VITAMIN B-12 ORAL) Take by mouth     estradioL (ESTRACE) 0.01 % (0.1 mg/gram) vaginal cream Place 1 g vaginally twice a week for 90 days Insert 1/4 applicator twice weekly 42.5 g 1   lacosamide (VIMPAT) 150 mg tablet Take 1 tablet (150 mg total) by mouth 2 (two) times daily for 180 days 60 tablet 5   losartan (COZAAR) 25 MG tablet Take 1 tablet (25 mg total)  by mouth daily. 90 tablet 1   montelukast (SINGULAIR) 10 mg tablet Take 1 tablet (10 mg total) by mouth once daily 30 tablet 5   niacin (NIASPAN) 1000 MG ER tablet Take 1 tablet (1,000 mg total) by mouth at bedtime. 90 tablet 1   sertraline (ZOLOFT) 100 MG tablet Take one tablet by mouth once daily 90 tablet 1   simvastatin (ZOCOR) 20 MG tablet Take 1 tablet (20 mg total) by mouth at bedtime. 90 tablet 1   traZODone (DESYREL) 100 MG tablet Take 1 tablet by mouth once nightly. 90 tablet 0   lisdexamfetamine (VYVANSE) 70 MG capsule Take 1 capsule (70 mg total) by mouth every morning for 30 days 30 capsule 0   ondansetron (ZOFRAN-ODT) 4 MG disintegrating tablet Take one tablet under tongue one hour before the bowel prep. May repeat in 8 hours if needed for nausea/vomiting. (Patient not taking: Reported on 07/03/2023) 4 tablet 0   semaglutide (WEGOVY) 0.25 mg/0.5 mL pen injector Inject 0.5 mLs (0.25 mg total) subcutaneously once a week (Patient not taking: Reported on 07/28/2023) 2 mL 5   sodium, potassium, and magnesium (SUPREP) oral solution Take 1 Bottle by mouth as directed One kit contains 2 bottles.  Take both bottles at the times instructed by your provider. (Patient not taking: Reported on 07/03/2023) 354 mL 0   No current facility-administered medications on file prior to visit.     Allergies: Allergies  Allergen Reactions   Adhesive Itching    Itching with certain band-aids with prolonged exposure    Fluticasone Propionate Other (See Comments)    Nasal irritation, burning, but can tolerate advair inhaler.   NOT ALLERGIC   Sulfa (Sulfonamide Antibiotics) Hives     SocHx: Social History   Tobacco Use   Smoking status: Never    Passive exposure: Never   Smokeless tobacco: Never  Vaping Use   Vaping status: Never Used  Substance Use Topics   Alcohol use: No   Drug use: No     OBJECTIVE: BP 102/65   Pulse 80   Ht 170.2 cm (5\' 7" )   Wt (!) 111.9 kg (246 lb 12.8 oz)   LMP   (LMP Unknown) Comment: 2 times yearly  BMI 38.65 kg/m    Gen: NAD HEENT: [place]/AT Heart: Regular rate Lungs: Normal work of breathing Ext: No BLE edema   ASSESSMENT/PLAN: Donna Vasquez 58 y.o. [id number] with a hx of obesity, HTN, OSA, asthma, HLD, anxiety, depression, epilepsy, JRA, IBS, recurrent UTIs, urinary incontinence, and interstitial cystitis who presents for follow-up after colposcopy.  #Abnormal pap - Pap/ Dysplasia History: 10/17/17 NILM with neg HRHPV 07/03/23 AGC with neg  HRHPV 07/18/23 colpo CIN1, ECC benign, EMB benign endometrial polyp and proliferative endometrium Per [facility], repeat pap in 1 year- January 2026  #Endometrial polyp - EMB 07/18/23 showed endometrial polyp and proliferative endometrium - Discussed option of pelvic u/s versus SIS vs hysteroscopy and patient desires to proceed directly to hysteroscopy - Plan for hysteroscopy, D&C, and Myosure polypectomy for polyp removal.  - Discussed planned procedure, risks, and benefits. Risks such as infection, bleeding, organ damage (including uterine perforation), anesthesia complications, and need for possible blood products were discussed; patient will accept products in case of an emergency. We discussed the risks of a blood transfusion, such as a 1 in 1.2-1.4 million chance of contracting HIV, Hep C. Discussed possibility of needing to perform a laparoscopic procedure in the case of uterine perforation.   - Surgical and blood consent signed - Pre-op orders placed (no antibiotics indicated) - Rx tylenol, ibuprofen, and zofran PRN. Sent to pharmacy.    Shyann Hefner Roda Shutters, MD

## 2023-08-01 ENCOUNTER — Other Ambulatory Visit: Payer: Self-pay | Admitting: Nurse Practitioner

## 2023-08-01 DIAGNOSIS — K838 Other specified diseases of biliary tract: Secondary | ICD-10-CM

## 2023-08-01 DIAGNOSIS — R1011 Right upper quadrant pain: Secondary | ICD-10-CM

## 2023-08-04 ENCOUNTER — Other Ambulatory Visit: Payer: Self-pay

## 2023-08-04 ENCOUNTER — Ambulatory Visit: Admitting: Urology

## 2023-08-04 VITALS — BP 154/90 | HR 120

## 2023-08-04 DIAGNOSIS — N3946 Mixed incontinence: Secondary | ICD-10-CM

## 2023-08-04 DIAGNOSIS — N3020 Other chronic cystitis without hematuria: Secondary | ICD-10-CM

## 2023-08-04 DIAGNOSIS — N3010 Interstitial cystitis (chronic) without hematuria: Secondary | ICD-10-CM

## 2023-08-04 MED ORDER — CEPHALEXIN 500 MG PO CAPS: 500.0000 mg | ORAL_CAPSULE | Freq: Three times a day (TID) | ORAL | 0 refills | Status: DC

## 2023-08-04 MED ORDER — NITROFURANTOIN MACROCRYSTAL 100 MG PO CAPS
100.0000 mg | ORAL_CAPSULE | Freq: Every day | ORAL | 11 refills | Status: AC
  Filled 2023-08-04: qty 30, 30d supply, fill #0
  Filled 2023-09-17: qty 30, 30d supply, fill #1
  Filled 2023-10-08 – 2023-10-23 (×3): qty 30, 30d supply, fill #2
  Filled 2023-12-11 – 2023-12-18 (×2): qty 30, 30d supply, fill #3
  Filled 2024-01-23: qty 30, 30d supply, fill #4
  Filled 2024-02-27: qty 30, 30d supply, fill #5
  Filled 2024-04-02: qty 30, 30d supply, fill #6
  Filled 2024-05-07: qty 30, 30d supply, fill #7

## 2023-08-04 MED ORDER — NITROFURANTOIN MACROCRYSTAL 100 MG PO CAPS: 100.0000 mg | ORAL_CAPSULE | Freq: Every day | ORAL | 11 refills | Status: DC

## 2023-08-04 MED ORDER — CEPHALEXIN 500 MG PO CAPS
500.0000 mg | ORAL_CAPSULE | Freq: Three times a day (TID) | ORAL | 0 refills | Status: AC
Start: 2023-08-04 — End: 2023-08-12
  Filled 2023-08-04: qty 21, 7d supply, fill #0

## 2023-08-04 NOTE — Progress Notes (Signed)
 08/04/2023 1:27 PM   Donna Vasquez Apr 07, 1966 [id number]  Referring provider: Alm Bustard, NP [address] [place],  [place] [address]  No chief complaint on file.   HPI: I was consulted to assist the patient for recurrent bladder infections urinary incontinence and interstitial cystitis.  She is being followed and has a planned hysteroscopy for an abnormal Pap smear by Dr. Feliberto Gottron.   In the last 6 months the patient has had 4 positive cultures.  She can get cramping that worsens.  Sometimes she gets a foul-smelling urine.  No increased frequency.  Symptoms improved with antibiotics.  Many years ago in [place] she describes a cystoscopy and being diagnosed with interstitial cystitis.  She was not put to sleep.  She was nonspecific but she tends to get cramps with her interstitial cystitis that goes away after taking Azo Standard  At baseline she can leak with coughing sneezing and sometimes with urgency.  No bedwetting.  Can leak when she walks and goes from sitting to standing position.  She wears 3-4 pads a day that are damp.  She noted ever since she was a teenager she can leak a small amount with laughing  She voids every 4-5 hours and gets up once a night.  Flow was reasonable and she does not feel empty  She has had injections of her neck with epidurals.  For the last 5 cultures have been positive.  She had a negative CT scan in September 2024.  She is has worsening hives with sulfa drugs  No history of bladder surgery or kidney stones     PMH: Past Medical History:  Diagnosis Date   Allergy    Anxiety    Asthma    Back pain    Bulging lumbar disc    Chicken pox    Constipation    Depression    Epilepsy (HCC)    Fatigue    Frequent headaches    Gall bladder disease    GERD (gastroesophageal reflux disease)    Glaucoma suspect    Headache    Heartburn    Hyperlipidemia    IBS (irritable bowel syndrome)    Interstitial cystitis     Lumbago    Palpitations    Plantar fasciitis    RA (rheumatoid arthritis) (HCC)    Rheumatoid arthritis (HCC)    Shortness of breath on exertion    Sleep apnea    Staring episodes    Trouble in sleeping    Urine incontinence    Vertigo    Vision changes    Vitamin D deficiency     Surgical History: Past Surgical History:  Procedure Laterality Date   CHOLECYSTECTOMY     COLONOSCOPY WITH PROPOFOL N/A 05/16/2023   Procedure: COLONOSCOPY WITH PROPOFOL;  Surgeon: Regis Bill, MD;  Location: [facility] ENDOSCOPY;  Service: Gastroenterology;  Laterality: N/A;   cyst removed from back     NASAL SINUS SURGERY     TUBAL LIGATION  1999    Home Medications:  Allergies as of 08/04/2023       Reactions   Flonase [fluticasone Propionate] Other (See Comments)   Nasal irritation, burning, but can tolerate advair inhaler.     Sulfa Antibiotics Hives   Wound Dressing Adhesive Itching        Medication List        Accurate as of August 04, 2023  1:27 PM. If you have any questions, ask your nurse or doctor.  albuterol 108 (90 Base) MCG/ACT inhaler Commonly known as: ProAir HFA Inhale 1-2 puffs into the lungs every 6 (six) hours as needed for wheezing or shortness of breath.   albuterol 108 (90 Base) MCG/ACT inhaler Commonly known as: VENTOLIN HFA Inhale 2 inhalations into the lungs every 4 (four) hours as needed for Wheezing   aspirin 81 MG tablet Take 81 mg by mouth daily.   cefUROXime 250 MG tablet Commonly known as: CEFTIN Take 1 tablet (250 mg total) by mouth every 12 (twelve) hours for 7 days.   cephALEXin 500 MG capsule Commonly known as: KEFLEX Take 1 capsule (500 mg total) by mouth 2 (two) times daily for 7 days   cholecalciferol 10 MCG (400 UNIT) Tabs tablet Commonly known as: VITAMIN D3 Take 400 Units by mouth daily.   cyanocobalamin 1000 MCG tablet Commonly known as: VITAMIN B12 Take 1,000 mcg by mouth daily.   doxycycline 100 MG  capsule Commonly known as: VIBRAMYCIN Take 1 capsule (100 mg total) by mouth 2 (two) times daily.   estradiol 0.1 MG/GM vaginal cream Commonly known as: ESTRACE Place 1 g (1/4 applicatorful) vaginally 2 (two) times a week.   fluticasone-salmeterol 100-50 MCG/ACT Aepb Commonly known as: Advair Diskus USE 1 INHALATION BY MOUTH  TWICE DAILY   guanFACINE 1 MG tablet Commonly known as: TENEX Take by mouth.   guanFACINE 2 MG Tb24 ER tablet Commonly known as: INTUNIV TAKE 1 TABLET BY MOUTH DAILY   guanFACINE 2 MG Tb24 ER tablet Commonly known as: INTUNIV Take 1 tablet (2 mg total) by mouth daily.   Lacosamide 150 MG Tabs TAKE 1 TABLET(150 MG) BY MOUTH IN THE MORNING AND AT BEDTIME   Lacosamide 150 MG Tabs Take 1 tablet (150 mg total) by mouth 2 (two) times daily.   lisdexamfetamine 60 MG capsule Commonly known as: VYVANSE Take 60 mg by mouth every morning.   lisdexamfetamine 70 MG capsule Commonly known as: VYVANSE Take one capsule (70mg ) by mouth each morning.   lisdexamfetamine 70 MG capsule Commonly known as: VYVANSE Take 1 capsule (70 mg total) by mouth every morning.   lisdexamfetamine 70 MG capsule Commonly known as: VYVANSE Take 1 capsule (70 mg total) by mouth every morning.   lisdexamfetamine 70 MG capsule Commonly known as: VYVANSE Take 1 capsule (70 mg total) by mouth every morning.   lisdexamfetamine 70 MG capsule Commonly known as: VYVANSE Take 1 capsule (70 mg total) by mouth every morning.   lisdexamfetamine 70 MG capsule Commonly known as: VYVANSE Take 1 capsule (70 mg total) by mouth every morning.   lisdexamfetamine 70 MG capsule Commonly known as: VYVANSE Take 1 capsule (70 mg total) by mouth every morning.   lisdexamfetamine 70 MG capsule Commonly known as: VYVANSE Take 1 capsule (70 mg total) by mouth every morning.   lisdexamfetamine 70 MG capsule Commonly known as: VYVANSE Take 1 capsule (70 mg total) by mouth every morning.    losartan 25 MG tablet Commonly known as: COZAAR TAKE 1 TABLET(25 MG) BY MOUTH DAILY   losartan 25 MG tablet Commonly known as: COZAAR Take 1 tablet (25 mg total) by mouth daily.   losartan 25 MG tablet Commonly known as: COZAAR Take 1 tablet (25 mg total) by mouth daily.   montelukast 10 MG tablet Commonly known as: SINGULAIR TAKE 1 TABLET BY MOUTH AT  BEDTIME   montelukast 10 MG tablet Commonly known as: SINGULAIR Take 1 tablet (10 mg total) by mouth daily.   Na Sulfate-K Sulfate-Mg  Sulfate concentrate 17.5-3.13-1.6 GM/177ML Soln Commonly known as: SUPREP Take 1 Bottle by mouth as directed One kit contains 2 bottles.  Take both bottles at the times instructed by your provider.   niacin 1000 MG CR tablet Commonly known as: NIASPAN TAKE 1 TABLET(1000 MG) BY MOUTH AT BEDTIME   niacin 1000 MG CR tablet Commonly known as: NIASPAN Take 1 tablet (1,000 mg total) by mouth at bedtime.   nitrofurantoin (macrocrystal-monohydrate) 100 MG capsule Commonly known as: MACROBID Take 1 capsule (100 mg total) by mouth every 12 (twelve) hours for 7 days.   ondansetron 4 MG disintegrating tablet Commonly known as: ZOFRAN-ODT Take 1 tablet (4 mg total) by mouth under tongue one hour before the bowel prep. May repeat in 8 hours if needed for nausea/vomiting.   ondansetron 4 MG tablet Commonly known as: ZOFRAN Take 4 mg by mouth every 8 (eight) hours as needed for nausea or vomiting.   ondansetron 4 MG tablet Commonly known as: ZOFRAN Take 1 tablet (4 mg total) by mouth every 8 (eight) hours as needed for Nausea for up to 7 days   sertraline 100 MG tablet Commonly known as: ZOLOFT TAKE 1 TABLET BY MOUTH  DAILY   sertraline 100 MG tablet Commonly known as: ZOLOFT Take one tablet by mouth once daily   sertraline 100 MG tablet Commonly known as: ZOLOFT Take 1 tablet (100 mg total) by mouth daily.   simvastatin 20 MG tablet Commonly known as: ZOCOR Take 1 tablet (20 mg total) by  mouth at bedtime.   simvastatin 20 MG tablet Commonly known as: ZOCOR TAKE 1 TABLET(20 MG) BY MOUTH EVERY EVENING   simvastatin 20 MG tablet Commonly known as: ZOCOR Take 1 tablet (20 mg total) by mouth at bedtime.   traZODone 50 MG tablet Commonly known as: DESYREL TAKE 1 TABLET BY MOUTH ONCE DAILY   traZODone 100 MG tablet Commonly known as: DESYREL Take 1 tablet (100 mg total) by mouth at bedtime.   Wegovy 0.25 MG/0.5ML Soaj Generic drug: Semaglutide-Weight Management Inject 0.25 mg into the skin once a week.        Allergies:  Allergies  Allergen Reactions   Flonase [Fluticasone Propionate] Other (See Comments)    Nasal irritation, burning, but can tolerate advair inhaler.     Sulfa Antibiotics Hives   Wound Dressing Adhesive Itching    Family History: Family History  Problem Relation Age of Onset   COPD Mother    Heart disease Mother    Polymyalgia rheumatica Mother    Emphysema Mother    Stroke Mother    Depression Mother    Anxiety disorder Mother    Heart disease Father    Hypertension Father    Hyperlipidemia Father    Anxiety disorder Father    Cancer Paternal Aunt        Breast   Breast cancer Paternal Aunt 83   Stroke Maternal Grandmother    Cancer Maternal Aunt    Other Brother        melas syndrom   Parkinson's disease Maternal Grandfather     Social History:  reports that she has never smoked. She has never been exposed to tobacco smoke. She has never used smokeless tobacco. She reports that she does not currently use alcohol. She reports that she does not use drugs.  ROS:  Physical Exam: LMP 08/19/2019   Constitutional:  Alert and oriented, No acute distress. HEENT: [place] AT, moist mucus membranes.  Trachea midline, no masses. Cardiovascular: No clubbing, cyanosis, or edema. Respiratory: Normal respiratory effort, no increased work of breathing. GI: Abdomen is soft,  nontender, nondistended, no abdominal masses GU: On pelvic examination she has had some bladder descensus at rest.  Mild hypermobility of the bladder neck and negative cough test with moderate cough and no significant prolapse Skin: No rashes, bruises or suspicious lesions. Lymph: No cervical or inguinal adenopathy. Neurologic: Grossly intact, no focal deficits, moving all 4 extremities. Psychiatric: Normal mood and affect.  Laboratory Data: Lab Results  Component Value Date   WBC 8.9 02/05/2023   HGB 14.8 02/05/2023   HCT 44.9 02/05/2023   MCV 94.7 02/05/2023   PLT 217 02/05/2023    Lab Results  Component Value Date   CREATININE 0.86 02/05/2023    No results found for: "PSA"  No results found for: "TESTOSTERONE"  Lab Results  Component Value Date   HGBA1C 5.0 12/13/2021    Urinalysis    Component Value Date/Time   COLORURINE YELLOW (A) 02/05/2023 1045   APPEARANCEUR CLEAR (A) 02/05/2023 1045   LABSPEC 1.020 02/05/2023 1045   PHURINE 7.0 02/05/2023 1045   GLUCOSEU NEGATIVE 02/05/2023 1045   HGBUR NEGATIVE 02/05/2023 1045   BILIRUBINUR NEGATIVE 02/05/2023 1045   BILIRUBINUR neg 12/16/2014 1532   KETONESUR NEGATIVE 02/05/2023 1045   PROTEINUR NEGATIVE 02/05/2023 1045   UROBILINOGEN 0.2 12/16/2014 1532   NITRITE NEGATIVE 02/05/2023 1045   LEUKOCYTESUR NEGATIVE 02/05/2023 1045    Pertinent Imaging: Urine reviewed and sent for culture.  Chart reviewed  Assessment & Plan: Patient has recurrent bladder infections.  Pathophysiology discussed.  She has mild mixed incontinence.  She has a distant history of interstitial cystitis.  She understands if we can suppress her bladder infections her incontinence may down regulate some.  I like to have her come back on Macrodantin 100 mg 30 x 11 for cystoscopy in approximately 6 weeks.  This can be done independent of her hysteroscopy.  If she still has incontinence affecting her quality of life I would then recommend urodynamics.   We would also look for evidence of interstitial cystitis.  I do not recommend a hydrodistention currently but I would look for evidence of bladder pain on bladder filling.  Call if culture positive Patient is having cramping now over the weekend and I thought it was best to give her an antibiotic.  Based upon previous culture I called and Keflex 500 mg 3 times a day for 7 days.  Then patient was started on daily Macrodantin.  Reassess in 8 weeks for cystoscopy Urodynamics described in detail  1. IC (interstitial cystitis) (Primary)  - Urinalysis, Complete   No follow-ups on file.  Martina Sinner, MD  [facility] [address] [place], [place] [address] [phone]

## 2023-08-05 ENCOUNTER — Other Ambulatory Visit: Payer: Self-pay

## 2023-08-05 LAB — URINALYSIS, COMPLETE
Bilirubin, UA: NEGATIVE
Glucose, UA: NEGATIVE
Nitrite, UA: POSITIVE — AB
Specific Gravity, UA: >1.03 — ABNORMAL HIGH (ref 1.005–1.030)
Urobilinogen, Ur: 0.2 mg/dL (ref 0.2–1.0)
pH, UA: 5.5 (ref 5.0–7.5)

## 2023-08-05 LAB — MICROSCOPIC EXAMINATION: WBC, UA: >30 /HPF — AB (ref 0–5)

## 2023-08-06 LAB — CULTURE, URINE COMPREHENSIVE

## 2023-08-07 ENCOUNTER — Other Ambulatory Visit: Payer: Self-pay

## 2023-08-07 MED ORDER — TRAZODONE HCL 100 MG PO TABS
100.0000 mg | ORAL_TABLET | Freq: Every day | ORAL | 0 refills | Status: DC
Start: 2023-08-07 — End: 2023-08-21
  Filled 2023-08-07: qty 90, 90d supply, fill #0

## 2023-08-15 ENCOUNTER — Encounter
Admission: RE | Admit: 2023-08-15 | Discharge: 2023-08-15 | Disposition: A | Discharge status: Home / Self Care | Source: Ambulatory Visit | Attending: Obstetrics and Gynecology | Admitting: Obstetrics and Gynecology

## 2023-08-15 ENCOUNTER — Other Ambulatory Visit: Payer: Self-pay

## 2023-08-15 VITALS — Ht 67.5 in | Wt 250.0 lb

## 2023-08-15 DIAGNOSIS — Z01810 Encounter for preprocedural cardiovascular examination: Secondary | ICD-10-CM

## 2023-08-15 DIAGNOSIS — Z01812 Encounter for preprocedural laboratory examination: Secondary | ICD-10-CM

## 2023-08-15 DIAGNOSIS — I10 Essential (primary) hypertension: Secondary | ICD-10-CM

## 2023-08-15 DIAGNOSIS — N1831 Chronic kidney disease, stage 3a: Secondary | ICD-10-CM

## 2023-08-15 HISTORY — DX: Essential (primary) hypertension: I10

## 2023-08-15 NOTE — Patient Instructions (Addendum)
 Your procedure is scheduled on:  FRIDAY APRIL 4  Report to the Registration Desk on the 1st floor of the [facility]. To find out your arrival time, please call [phone] between 1PM - 3PM on:  THURSDAY APRIL 3  If your arrival time is 6:00 am, do not arrive before that time as the [facility] entrance doors do not open until 6:00 am.  REMEMBER: Instructions that are not followed completely may result in serious medical risk, up to and including death; or upon the discretion of your surgeon and anesthesiologist your surgery may need to be rescheduled.  Do not eat food after midnight the night before surgery.  No gum chewing or hard candies.  You may however, drink CLEAR liquids up to 2 hours before you are scheduled to arrive for your surgery. Do not drink anything within 2 hours of your scheduled arrival time.  Clear liquids include: - water  - apple juice without pulp - gatorade (not RED colors) - black coffee or tea (Do NOT add milk or creamers to the coffee or tea) Do NOT drink anything that is not on this list.  In addition, your doctor has ordered for you to drink the provided:  Ensure Pre-Surgery Clear Carbohydrate Drink   Drinking this carbohydrate drink up to two hours before surgery helps to reduce insulin resistance and improve patient outcomes. Please complete drinking 2 hours before scheduled arrival time.  One week prior to surgery:FRIDAY MARCH 28  Stop Anti-inflammatories (NSAIDS) such as Advil, Aleve, Ibuprofen, Motrin, Naproxen, Naprosyn and Aspirin based products such as Excedrin, Goody's Powder, BC Powder. Stop ANY OVER THE COUNTER supplements until after surgery. Cholecalciferol (VITAMIN D3)  cyanocobalamin (VITAMIN B12 )   You may however, continue to take Tylenol if needed for pain up until the day of surgery.  Continue taking all of your other prescription medications up until the day of surgery.  ON THE DAY OF SURGERY ONLY TAKE THESE MEDICATIONS WITH  SIPS OF WATER: Lacosamide   Use inhalers on the day of surgery and bring to the hospital. albuterol (VENTOLIN HFA)   No Alcohol for 24 hours before or after surgery.  No Smoking including e-cigarettes for 24 hours before surgery.  No chewable tobacco products for at least 6 hours before surgery.  No nicotine patches on the day of surgery.  Do not use any "recreational" drugs for at least a week (preferably 2 weeks) before your surgery.  Please be advised that the combination of cocaine and anesthesia may have negative outcomes, up to and including death. If you test positive for cocaine, your surgery will be cancelled.  On the morning of surgery brush your teeth with toothpaste and water, you may rinse your mouth with mouthwash if you wish. Do not swallow any toothpaste or mouthwash.  Do not wear jewelry, make-up, hairpins, clips or nail polish.  For welded (permanent) jewelry: bracelets, anklets, waist bands, etc.  Please have this removed prior to surgery.  If it is not removed, there is a chance that hospital personnel will need to cut it off on the day of surgery.  Do not wear lotions, powders, or perfumes.   Do not shave body hair from the neck down 48 hours before surgery.  Contact lenses, hearing aids and dentures may not be worn into surgery.  Do not bring valuables to the hospital. [facility] is not responsible for any missing/lost belongings or valuables.   Notify your doctor if there is any change in  your medical condition (cold, fever, infection).  Wear comfortable clothing (specific to your surgery type) to the hospital.  After surgery, you can help prevent lung complications by doing breathing exercises.  Take deep breaths and cough every 1-2 hours.   If you are being discharged the day of surgery, you will not be allowed to drive home. You will need a responsible individual to drive you home and stay with you for 24 hours after surgery.   If you are taking  public transportation, you will need to have a responsible individual with you.  Please call the Pre-admissions Testing Dept. at [phone] if you have any questions about these instructions.  Surgery Visitation Policy:  Patients having surgery or a procedure may have two visitors.  Children under the age of 50 must have an adult with them who is not the patient.

## 2023-08-18 ENCOUNTER — Other Ambulatory Visit: Payer: Self-pay

## 2023-08-18 ENCOUNTER — Encounter
Admission: RE | Admit: 2023-08-18 | Discharge: 2023-08-18 | Disposition: A | Discharge status: Home / Self Care | Source: Ambulatory Visit | Attending: Obstetrics and Gynecology | Admitting: Obstetrics and Gynecology

## 2023-08-18 DIAGNOSIS — Z01812 Encounter for preprocedural laboratory examination: Secondary | ICD-10-CM

## 2023-08-18 DIAGNOSIS — N1831 Chronic kidney disease, stage 3a: Secondary | ICD-10-CM | POA: Diagnosis not present

## 2023-08-18 DIAGNOSIS — Z01818 Encounter for other preprocedural examination: Secondary | ICD-10-CM | POA: Diagnosis present

## 2023-08-18 DIAGNOSIS — I129 Hypertensive chronic kidney disease with stage 1 through stage 4 chronic kidney disease, or unspecified chronic kidney disease: Secondary | ICD-10-CM | POA: Diagnosis not present

## 2023-08-18 DIAGNOSIS — Z01810 Encounter for preprocedural cardiovascular examination: Secondary | ICD-10-CM

## 2023-08-18 DIAGNOSIS — I10 Essential (primary) hypertension: Secondary | ICD-10-CM

## 2023-08-18 LAB — BASIC METABOLIC PANEL WITH GFR
Anion gap: 8 (ref 5–15)
BUN: 19 mg/dL (ref 6–20)
CO2: 27 mmol/L (ref 22–32)
Calcium: 9 mg/dL (ref 8.9–10.3)
Chloride: 106 mmol/L (ref 98–111)
Creatinine, Ser: 0.75 mg/dL (ref 0.44–1.00)
GFR, Estimated: >60 mL/min (ref >60)
Glucose, Bld: 107 mg/dL — ABNORMAL HIGH (ref 70–99)
Potassium: 3.7 mmol/L (ref 3.5–5.1)
Sodium: 141 mmol/L (ref 135–145)

## 2023-08-18 LAB — CBC
HCT: 39.7 % (ref 36.0–46.0)
Hemoglobin: 13.5 g/dL (ref 12.0–15.0)
MCH: 32.2 pg (ref 26.0–34.0)
MCHC: 34 g/dL (ref 30.0–36.0)
MCV: 94.7 fL (ref 80.0–100.0)
Platelets: 221 10*3/uL (ref 150–400)
RBC: 4.19 MIL/uL (ref 3.87–5.11)
RDW: 12.6 % (ref 11.5–15.5)
WBC: 6.2 10*3/uL (ref 4.0–10.5)
nRBC: 0 % (ref 0.0–0.2)

## 2023-08-18 LAB — TYPE AND SCREEN
ABO/RH(D): O POS
Antibody Screen: NEGATIVE

## 2023-08-18 MED ORDER — LOSARTAN POTASSIUM 25 MG PO TABS
25.0000 mg | ORAL_TABLET | Freq: Every day | ORAL | 1 refills | Status: DC
  Filled 2023-08-18 – 2023-08-29 (×2): qty 90, 90d supply, fill #0
  Filled 2024-01-01: qty 90, 90d supply, fill #1

## 2023-08-19 ENCOUNTER — Other Ambulatory Visit: Payer: Self-pay

## 2023-08-19 MED ORDER — ALBUTEROL SULFATE HFA 108 (90 BASE) MCG/ACT IN AERS
2.0000 | INHALATION_SPRAY | RESPIRATORY_TRACT | 1 refills | Status: DC | PRN
  Filled 2023-08-19: qty 6.7, 16d supply, fill #0
  Filled 2023-08-29: qty 6.7, 30d supply, fill #0

## 2023-08-19 MED ORDER — MONTELUKAST SODIUM 10 MG PO TABS
10.0000 mg | ORAL_TABLET | Freq: Every day | ORAL | 1 refills | Status: DC
  Filled 2023-08-19: qty 90, 90d supply, fill #0

## 2023-08-21 ENCOUNTER — Other Ambulatory Visit: Payer: Self-pay

## 2023-08-21 MED ORDER — FLUTICASONE-SALMETEROL 250-50 MCG/ACT IN AEPB
1.0000 | INHALATION_SPRAY | Freq: Every day | RESPIRATORY_TRACT | 5 refills | Status: AC
Start: 2023-08-21 — End: ?
  Filled 2023-08-21: qty 60, 30d supply, fill #0
  Filled 2023-08-29: qty 60, 60d supply, fill #0

## 2023-08-21 MED ORDER — MONTELUKAST SODIUM 10 MG PO TABS
10.0000 mg | ORAL_TABLET | Freq: Every day | ORAL | 1 refills | Status: AC
  Filled 2023-08-21 – 2023-08-29 (×2): qty 90, 90d supply, fill #0

## 2023-08-21 MED ORDER — TRAZODONE HCL 100 MG PO TABS
100.0000 mg | ORAL_TABLET | Freq: Every day | ORAL | 1 refills | Status: DC
  Filled 2023-08-21 – 2023-12-11 (×2): qty 90, 90d supply, fill #0

## 2023-08-21 MED ORDER — ALBUTEROL SULFATE HFA 108 (90 BASE) MCG/ACT IN AERS
2.0000 | INHALATION_SPRAY | RESPIRATORY_TRACT | 1 refills | Status: DC | PRN
Start: 2023-08-21 — End: 2023-08-22
  Filled 2023-08-21: qty 6.7, 30d supply, fill #0

## 2023-08-22 ENCOUNTER — Ambulatory Visit: Payer: Self-pay | Admitting: Urgent Care

## 2023-08-22 ENCOUNTER — Ambulatory Visit
Admission: RE | Admit: 2023-08-22 | Discharge: 2023-08-22 | Disposition: A | Discharge status: Home / Self Care | Attending: Obstetrics and Gynecology | Admitting: Obstetrics and Gynecology

## 2023-08-22 ENCOUNTER — Ambulatory Visit: Admitting: Certified Registered"

## 2023-08-22 ENCOUNTER — Encounter: Payer: Self-pay | Admitting: Obstetrics and Gynecology

## 2023-08-22 ENCOUNTER — Other Ambulatory Visit: Payer: Self-pay

## 2023-08-22 ENCOUNTER — Encounter
Admission: RE | Disposition: A | Discharge status: Home / Self Care | Payer: Self-pay | Source: Home / Self Care | Attending: Obstetrics and Gynecology

## 2023-08-22 DIAGNOSIS — Z79899 Other long term (current) drug therapy: Secondary | ICD-10-CM | POA: Diagnosis not present

## 2023-08-22 DIAGNOSIS — J4520 Mild intermittent asthma, uncomplicated: Secondary | ICD-10-CM | POA: Insufficient documentation

## 2023-08-22 DIAGNOSIS — G4733 Obstructive sleep apnea (adult) (pediatric): Secondary | ICD-10-CM | POA: Diagnosis not present

## 2023-08-22 DIAGNOSIS — M1990 Unspecified osteoarthritis, unspecified site: Secondary | ICD-10-CM | POA: Diagnosis not present

## 2023-08-22 DIAGNOSIS — R519 Headache, unspecified: Secondary | ICD-10-CM | POA: Insufficient documentation

## 2023-08-22 DIAGNOSIS — E669 Obesity, unspecified: Secondary | ICD-10-CM | POA: Insufficient documentation

## 2023-08-22 DIAGNOSIS — Z6838 Body mass index (BMI) 38.0-38.9, adult: Secondary | ICD-10-CM | POA: Insufficient documentation

## 2023-08-22 DIAGNOSIS — K219 Gastro-esophageal reflux disease without esophagitis: Secondary | ICD-10-CM | POA: Insufficient documentation

## 2023-08-22 DIAGNOSIS — N898 Other specified noninflammatory disorders of vagina: Secondary | ICD-10-CM | POA: Insufficient documentation

## 2023-08-22 DIAGNOSIS — I10 Essential (primary) hypertension: Secondary | ICD-10-CM | POA: Insufficient documentation

## 2023-08-22 DIAGNOSIS — E785 Hyperlipidemia, unspecified: Secondary | ICD-10-CM | POA: Diagnosis not present

## 2023-08-22 DIAGNOSIS — N3010 Interstitial cystitis (chronic) without hematuria: Secondary | ICD-10-CM | POA: Diagnosis not present

## 2023-08-22 DIAGNOSIS — F419 Anxiety disorder, unspecified: Secondary | ICD-10-CM | POA: Diagnosis not present

## 2023-08-22 DIAGNOSIS — F32A Depression, unspecified: Secondary | ICD-10-CM | POA: Diagnosis not present

## 2023-08-22 DIAGNOSIS — M0800 Unspecified juvenile rheumatoid arthritis of unspecified site: Secondary | ICD-10-CM | POA: Insufficient documentation

## 2023-08-22 DIAGNOSIS — Z7982 Long term (current) use of aspirin: Secondary | ICD-10-CM | POA: Diagnosis not present

## 2023-08-22 DIAGNOSIS — N856 Intrauterine synechiae: Secondary | ICD-10-CM | POA: Insufficient documentation

## 2023-08-22 DIAGNOSIS — K589 Irritable bowel syndrome without diarrhea: Secondary | ICD-10-CM | POA: Insufficient documentation

## 2023-08-22 DIAGNOSIS — Z87440 Personal history of urinary (tract) infections: Secondary | ICD-10-CM | POA: Diagnosis not present

## 2023-08-22 DIAGNOSIS — N840 Polyp of corpus uteri: Secondary | ICD-10-CM | POA: Insufficient documentation

## 2023-08-22 DIAGNOSIS — G40909 Epilepsy, unspecified, not intractable, without status epilepticus: Secondary | ICD-10-CM | POA: Insufficient documentation

## 2023-08-22 HISTORY — PX: HYSTEROSCOPY WITH D & C: SHX1775

## 2023-08-22 LAB — ABO/RH: ABO/RH(D): O POS

## 2023-08-22 SURGERY — DILATATION AND CURETTAGE /HYSTEROSCOPY
Anesthesia: General | Site: Cervix

## 2023-08-22 MED ORDER — DEXAMETHASONE SODIUM PHOSPHATE 10 MG/ML IJ SOLN
INTRAMUSCULAR | Status: DC | PRN
Start: 2023-08-22 — End: 2023-08-22
  Administered 2023-08-22: 10 mg via INTRAVENOUS

## 2023-08-22 MED ORDER — FENTANYL CITRATE (PF) 100 MCG/2ML IJ SOLN
INTRAMUSCULAR | Status: DC | PRN
  Administered 2023-08-22: 50 ug via INTRAVENOUS

## 2023-08-22 MED ORDER — CHLORHEXIDINE GLUCONATE 0.12 % MT SOLN
15.0000 mL | Freq: Once | OROMUCOSAL | Status: AC
  Administered 2023-08-22: 15 mL via OROMUCOSAL

## 2023-08-22 MED ORDER — MIDAZOLAM HCL 5 MG/5ML IJ SOLN
INTRAMUSCULAR | Status: DC | PRN
  Administered 2023-08-22: 2 mg via INTRAVENOUS

## 2023-08-22 MED ORDER — MIDAZOLAM HCL 2 MG/2ML IJ SOLN
INTRAMUSCULAR | Status: AC
  Filled 2023-08-22: qty 2

## 2023-08-22 MED ORDER — POVIDONE-IODINE 10 % EX SWAB: 2.0000 | Freq: Once | CUTANEOUS | Status: DC

## 2023-08-22 MED ORDER — CHLORHEXIDINE GLUCONATE 0.12 % MT SOLN
15.0000 mL | Freq: Once | OROMUCOSAL | Status: DC
Start: 2023-08-22 — End: 2023-08-22

## 2023-08-22 MED ORDER — ONDANSETRON HCL 4 MG/2ML IJ SOLN
INTRAMUSCULAR | Status: DC | PRN
  Administered 2023-08-22: 4 mg via INTRAVENOUS

## 2023-08-22 MED ORDER — ORAL CARE MOUTH RINSE: 15.0000 mL | Freq: Once | OROMUCOSAL | Status: AC

## 2023-08-22 MED ORDER — PROPOFOL 10 MG/ML IV BOLUS
INTRAVENOUS | Status: DC | PRN
  Administered 2023-08-22: 200 mg via INTRAVENOUS

## 2023-08-22 MED ORDER — ACETAMINOPHEN 500 MG PO TABS
1000.0000 mg | ORAL_TABLET | ORAL | Status: AC
Start: 2023-08-22 — End: 2023-08-22
  Administered 2023-08-22: 1000 mg via ORAL

## 2023-08-22 MED ORDER — PROPOFOL 1000 MG/100ML IV EMUL
INTRAVENOUS | Status: AC
  Filled 2023-08-22: qty 100

## 2023-08-22 MED ORDER — OXYCODONE HCL 5 MG/5ML PO SOLN: 5.0000 mg | Freq: Once | ORAL | Status: DC | PRN

## 2023-08-22 MED ORDER — GABAPENTIN 300 MG PO CAPS
300.0000 mg | ORAL_CAPSULE | ORAL | Status: AC
  Administered 2023-08-22: 300 mg via ORAL

## 2023-08-22 MED ORDER — ORAL CARE MOUTH RINSE: 15.0000 mL | Freq: Once | OROMUCOSAL | Status: DC

## 2023-08-22 MED ORDER — SILVER NITRATE-POT NITRATE 75-25 % EX MISC
CUTANEOUS | Status: AC
  Filled 2023-08-22: qty 10

## 2023-08-22 MED ORDER — SODIUM CHLORIDE 0.9 % IV SOLN
INTRAVENOUS | Status: DC
Start: 2023-08-22 — End: 2023-08-22

## 2023-08-22 MED ORDER — LACTATED RINGERS IV SOLN: INTRAVENOUS | Status: DC

## 2023-08-22 MED ORDER — ACETAMINOPHEN 500 MG PO TABS
ORAL_TABLET | ORAL | Status: AC
  Filled 2023-08-22: qty 2

## 2023-08-22 MED ORDER — LIDOCAINE HCL (CARDIAC) PF 100 MG/5ML IV SOSY
PREFILLED_SYRINGE | INTRAVENOUS | Status: DC | PRN
  Administered 2023-08-22: 80 mg via INTRAVENOUS

## 2023-08-22 MED ORDER — CHLORHEXIDINE GLUCONATE 0.12 % MT SOLN
OROMUCOSAL | Status: AC
  Filled 2023-08-22: qty 15

## 2023-08-22 MED ORDER — FENTANYL CITRATE (PF) 100 MCG/2ML IJ SOLN: 25.0000 ug | INTRAMUSCULAR | Status: DC | PRN

## 2023-08-22 MED ORDER — LIDOCAINE-EPINEPHRINE 1 %-1:100000 IJ SOLN
INTRAMUSCULAR | Status: AC
  Filled 2023-08-22: qty 1

## 2023-08-22 MED ORDER — GABAPENTIN 300 MG PO CAPS
ORAL_CAPSULE | ORAL | Status: AC
  Filled 2023-08-22: qty 1

## 2023-08-22 MED ORDER — FENTANYL CITRATE (PF) 100 MCG/2ML IJ SOLN
INTRAMUSCULAR | Status: AC
  Filled 2023-08-22: qty 2

## 2023-08-22 MED ORDER — OXYCODONE HCL 5 MG PO TABS: 5.0000 mg | ORAL_TABLET | Freq: Once | ORAL | Status: DC | PRN

## 2023-08-22 MED ORDER — ONDANSETRON HCL 4 MG/2ML IJ SOLN: 4.0000 mg | Freq: Once | INTRAMUSCULAR | Status: DC | PRN

## 2023-08-22 SURGICAL SUPPLY — 19 items
APPLICATOR COTTON TIP 6 STRL (MISCELLANEOUS) IMPLANT
APPLICATOR COTTON TIP 6IN STRL (MISCELLANEOUS) IMPLANT
DEVICE MYOSURE LITE (MISCELLANEOUS) IMPLANT
DEVICE MYOSURE REACH (MISCELLANEOUS) IMPLANT
GLOVE SURG SYN 6.5 ES PF (GLOVE) ×2 IMPLANT
GLOVE SURG SYN 6.5 PF PI (GLOVE) ×2 IMPLANT
GOWN STRL REUS W/ TWL LRG LVL3 (GOWN DISPOSABLE) ×1 IMPLANT
KIT PROCEDURE FLUENT (KITS) IMPLANT
KIT TURNOVER CYSTO (KITS) ×1 IMPLANT
MANIFOLD NEPTUNE II (INSTRUMENTS) ×1 IMPLANT
PACK DNC HYST (MISCELLANEOUS) IMPLANT
SCRUB CHG 4% DYNA-HEX 4OZ (MISCELLANEOUS) ×1 IMPLANT
SEAL ROD LENS SCOPE MYOSURE (ABLATOR) ×1 IMPLANT
SOL .9 NS 3000ML IRR UROMATIC (IV SOLUTION) ×1 IMPLANT
SYR CONTROL 10ML LL (SYRINGE) ×1 IMPLANT
TOWEL OR 17X26 4PK STRL BLUE (TOWEL DISPOSABLE) ×1 IMPLANT
TRAP FLUID SMOKE EVACUATOR (MISCELLANEOUS) ×1 IMPLANT
TUBING CONNECTING 10 (TUBING) IMPLANT
WATER STERILE IRR 500ML POUR (IV SOLUTION) ×1 IMPLANT

## 2023-08-22 NOTE — Anesthesia Procedure Notes (Signed)
 Procedure Name: LMA Insertion Date/Time: 08/22/2023 8:39 AM  Performed by: Cheral Bay, CRNAPre-anesthesia Checklist: Patient identified, Emergency Drugs available, Suction available and Patient being monitored Patient Re-evaluated:Patient Re-evaluated prior to induction Oxygen Delivery Method: Circle system utilized Preoxygenation: Pre-oxygenation with 100% oxygen Induction Type: IV induction LMA: LMA inserted LMA Size: 4.0 Tube type: Oral Number of attempts: 1 Placement Confirmation: positive ETCO2 and breath sounds checked- equal and bilateral Tube secured with: Tape Dental Injury: Teeth and Oropharynx as per pre-operative assessment

## 2023-08-22 NOTE — Interval H&P Note (Signed)
 History and Physical Interval Note:  08/22/2023 8:30 AM  Donna Vasquez  has presented today for surgery, with the diagnosis of endometrial polyp.  The various methods of treatment have been discussed with the patient and family. After consideration of risks, benefits and other options for treatment, the patient has consented to  Procedure(s) with comments: DILATATION AND CURETTAGE /HYSTEROSCOPY (N/A) - POSSIBLE MYOSURE, POLYPECTOMY as a surgical intervention.  The patient's history has been reviewed, patient examined, no change in status, stable for surgery.  I have reviewed the patient's chart and labs.  Questions were answered to the patient's satisfaction.     Schelly Chuba V Adom Schoeneck

## 2023-08-22 NOTE — Op Note (Signed)
 Operative Note  Name: Donna Vasquez MRN: [id number] Date: 08/22/23     Preoperative diagnosis: Abnormal pap smear, endometrial polyp Postoperative diagnosis: Same and intrauterine adhesions   Procedure: Hysteroscopy, Dilation & Curettage, Myosure polypectomy Surgeon: Kathalene Frames, MD Anesthesia: General    EBL: 5cc  IVF: 600cc UOP: 50cc Fluid deficit: 210cc   Complications: none  Specimens: endometrial currettings and endometrial polyp Findings: Anteverted uterus that was sounded to 7cm, multiparous cervix, normal appearing initial portion of endocervical canal, cephalad portion of endocervical canal and lower uterine segment with proliferative tissue and polypoid appearing mass which was resected. Remainder of uterus atrophic appearing with some scarring at the fundus. Bilateral ostia visualized- left one appears scarred. Good amount of endometrial tissue obtained with curetting.      Procedure:  The risks, benefits, indications, and alternative of the procedure were reviewed with the patient in pre-operative area and informed consent was obtained. The patient was taken to the operating room with IV in place. General anesthesia was administered. She was placed in the dorsal lithotomy position, prepped and draped in the usual sterile fashion. A red rubber catheter was used to drain the bladder. A sterile speculum was placed in the patient's vagina and the cervix visualized. A single tooth tenaculum was placed on the anterior lip of the cervix. PThe uterus was then gently sounded to 7 cm. The cervix was dilated to allow for hysteroscope using Pratt dilators. The hysteroscopy was done with normal saline with the above findings were noted. The Myosure was used to remove the areas of proliferative tissue and the polypoid lesion. The hysteroscopy was then removed. A sharp curettage was then performed until a gritty texture was noted. Tenaculum was removed and hemostasis was noted.    Specimens were sent to pathology. Vaginal instruments were removed. Hemostasis noted. The patient tolerated the procedure well and was taken to the recovery room in stable condition.   Signed by: Romana Juniper, Md, 08/22/23, 8:31 AM

## 2023-08-22 NOTE — Anesthesia Preprocedure Evaluation (Addendum)
 Anesthesia Evaluation  Patient identified by MRN, date of birth, ID band Patient awake    Reviewed: Allergy & Precautions, H&P , NPO status , Patient's Chart, lab work & pertinent test results  Airway Mallampati: III  TM Distance: >3 FB Neck ROM: Full    Dental no notable dental hx. (+) Caps Has some caps, but none in front teeth, teeth intact, none loose:   Pulmonary neg pulmonary ROS, asthma , sleep apnea    Pulmonary exam normal breath sounds clear to auscultation       Cardiovascular hypertension, Normal cardiovascular exam Rhythm:Regular Rate:Normal     Neuro/Psych  Headaches, Seizures -,  PSYCHIATRIC DISORDERS Anxiety Depression    negative neurological ROS  negative psych ROS   GI/Hepatic negative GI ROS, Neg liver ROS,GERD  ,,  Endo/Other  negative endocrine ROS    Renal/GU Renal diseasenegative Renal ROS  negative genitourinary   Musculoskeletal negative musculoskeletal ROS (+) Arthritis ,    Abdominal   Peds negative pediatric ROS (+)  Hematology negative hematology ROS (+)   Anesthesia Other Findings Medical History   Asthma--auscultates clear today, no recent problems, has some nasal drainage  Occasional mild GERD Hyperlipidemia  Allergy IBS (irritable bowel syndrome) Frequent headaches Interstitial cystitis  Urine incontinence Fatigue  Trouble in sleeping Headache  Vision changes Shortness of breath on exertion Depression Heartburn  Vertigo RA (rheumatoid arthritis) (HCC)  Back pain Gall bladder disease Constipation Vitamin D deficiency Bulging lumbar disc Staring episodes Glaucoma suspect Epilepsy (HCC)  Rheumatoid arthritis (HCC) Anxiety  Sleep apnea GERD (gastroesophageal reflux disease) Palpitations Plantar fasciitis  Lumbago Hypertension     Reproductive/Obstetrics negative OB ROS                             Anesthesia Physical Anesthesia  Plan  ASA: 2  Anesthesia Plan: General   Post-op Pain Management:    Induction: Intravenous  PONV Risk Score and Plan:   Airway Management Planned: LMA  Additional Equipment:   Intra-op Plan:   Post-operative Plan: Extubation in OR  Informed Consent: I have reviewed the patients History and Physical, chart, labs and discussed the procedure including the risks, benefits and alternatives for the proposed anesthesia with the patient or authorized representative who has indicated his/her understanding and acceptance.     Dental Advisory Given  Plan Discussed with: Anesthesiologist, CRNA and Surgeon  Anesthesia Plan Comments: (Patient consented for risks of anesthesia including but not limited to:  - adverse reactions to medications - damage to eyes, teeth, lips or other oral mucosa - nerve damage due to positioning  - sore throat or hoarseness - Damage to heart, brain, nerves, lungs, other parts of body or loss of life  Patient voiced understanding and assent.)       Anesthesia Quick Evaluation

## 2023-08-22 NOTE — Anesthesia Postprocedure Evaluation (Signed)
 Anesthesia Post Note  Patient: Donna Vasquez  Procedure(s) Performed: DILATATION AND CURETTAGE /HYSTEROSCOPY (Cervix)  Patient location during evaluation: PACU Anesthesia Type: General Level of consciousness: awake and alert Pain management: pain level controlled Vital Signs Assessment: post-procedure vital signs reviewed and stable Respiratory status: spontaneous breathing, nonlabored ventilation, respiratory function stable and patient connected to nasal cannula oxygen Cardiovascular status: blood pressure returned to baseline and stable Postop Assessment: no apparent nausea or vomiting Anesthetic complications: no   No notable events documented.   Last Vitals:  Vitals:   08/22/23 0945 08/22/23 1015  BP: (!) 151/71   Pulse: 65 64  Resp: 12 14  Temp: (!) 36.2 C 36.4 C  SpO2: 100%     Last Pain:  Vitals:   08/22/23 1015  TempSrc: Temporal  PainSc: 2                  Brinkley Peet C Renald Haithcock

## 2023-08-22 NOTE — Transfer of Care (Signed)
 Immediate Anesthesia Transfer of Care Note  Patient: Donna Vasquez  Procedure(s) Performed: DILATATION AND CURETTAGE /HYSTEROSCOPY (Cervix)  Patient Location: PACU  Anesthesia Type:General  Level of Consciousness: drowsy  Airway & Oxygen Therapy: Patient Spontanous Breathing  Post-op Assessment: Report given to RN and Post -op Vital signs reviewed and stable  Post vital signs: Reviewed and stable  Last Vitals:  Vitals Value Taken Time  BP    Temp    Pulse 55 08/22/23 0927  Resp 14 08/22/23 0927  SpO2 100 % 08/22/23 0927  Vitals shown include unfiled device data.  Last Pain:  Vitals:   08/22/23 0636  TempSrc: Temporal  PainSc: 0-No pain         Complications: No notable events documented.

## 2023-08-23 ENCOUNTER — Encounter: Payer: Self-pay | Admitting: Obstetrics and Gynecology

## 2023-08-25 ENCOUNTER — Ambulatory Visit: Payer: Managed Care, Other (non HMO) | Admitting: Urology

## 2023-08-25 LAB — SURGICAL PATHOLOGY

## 2023-08-26 ENCOUNTER — Ambulatory Visit
Admission: RE | Admit: 2023-08-26 | Discharge: 2023-08-26 | Disposition: A | Discharge status: Home / Self Care | Source: Ambulatory Visit | Attending: Nurse Practitioner | Admitting: Nurse Practitioner

## 2023-08-26 DIAGNOSIS — K838 Other specified diseases of biliary tract: Secondary | ICD-10-CM

## 2023-08-26 DIAGNOSIS — R1011 Right upper quadrant pain: Secondary | ICD-10-CM

## 2023-08-26 MED ORDER — GADOPICLENOL 0.5 MMOL/ML IV SOLN
10.0000 mL | Freq: Once | INTRAVENOUS | Status: AC | PRN
  Administered 2023-08-26: 10 mL via INTRAVENOUS

## 2023-08-29 ENCOUNTER — Other Ambulatory Visit: Payer: Self-pay

## 2023-09-01 ENCOUNTER — Other Ambulatory Visit: Payer: Self-pay

## 2023-09-02 ENCOUNTER — Other Ambulatory Visit: Payer: Self-pay

## 2023-09-02 MED ORDER — SIMVASTATIN 20 MG PO TABS
20.0000 mg | ORAL_TABLET | Freq: Every day | ORAL | 1 refills | Status: DC
  Filled 2023-09-02 – 2023-10-16 (×2): qty 90, 90d supply, fill #0

## 2023-09-02 MED ORDER — NIACIN ER (ANTIHYPERLIPIDEMIC) 1000 MG PO TBCR
1000.0000 mg | EXTENDED_RELEASE_TABLET | Freq: Every day | ORAL | 1 refills | Status: DC
  Filled 2023-09-02 – 2023-10-16 (×2): qty 90, 90d supply, fill #0
  Filled 2024-02-27: qty 90, 90d supply, fill #1

## 2023-09-08 ENCOUNTER — Ambulatory Visit: Payer: Self-pay | Admitting: Urology

## 2023-09-17 ENCOUNTER — Other Ambulatory Visit: Payer: Self-pay

## 2023-10-06 ENCOUNTER — Other Ambulatory Visit: Admitting: Urology

## 2023-10-08 ENCOUNTER — Other Ambulatory Visit: Payer: Self-pay

## 2023-10-10 NOTE — Unmapped External Note (Signed)
 Preoperative Diagnosis: Visually significant dermatochalasis of both upper eyelid(s) Bilateral mechanical ptosis Bilateral visual obstruction  Postoperative Diagnosis:  Same.  Procedure(s) Performed:  Upper eyelid blepharoplasty on both upper eyelid(s)   Teaching Surgeon: Refugio Nanny, MD   Assistants: none  Anesthesia: General, 1% lidocaine  1:1 with 0.5% marcaine with epinephrine   Specimens: None  Estimated Blood Loss: Less than 1 cc  Complications: None.  Operative Findings: Upper eyelid dermatochalasis.  PROCEDURE:  15823 Upper eyelid blepharoplasty   After the risks, benefits, complications and alternatives were discussed with the patient, appropriate informed consent was obtained. The patient was then brought to the operating suite and reclined supine.  Timeout was conducted. The upper eyelid crease line was marked at 9 mm and an ellipse was drawn using the pinch technique, making sure 20 mm or more was left outside the ellipse.  General anesthesia was given and local anesthetic consisting of the above was injected subcutaneously to both upper eyelid(s). After adequate local was instilled, the patient was prepped and draped in the usual sterile fashion for eyelid surgery.   Attention was turned to the left upper eyelid. The skin was incised with a #15 scalpel blade and the skin and minimal orbicularis muscle were excised with monopolar cautery.  Bleeding points were coagulated with monopolar cautery.  Then laterally the orbicularis was incised at the halfway mark and bovie dissection was carried to the superior orbital rim.  While the septum was held back with malleables, 5-0 chromic gut was passed through the orbicularis, superior orbital rim and orbicularis and tied. This was done 3 times to augment and support the lateral brow fat span. The skin was closed with a running and interrupted suture of 6-0 nylon. Attention was then turned to the opposite eyelid where the same  procedure was performed in the same manner  Antibiotic ointment was applied to the incision site(s) followed by ice packs. The patient tolerated the procedure well and was taken to the recovery area where she recovered without difficulty.   Refugio Nanny, MD Attending, Ophthalmology  10/10/2023, 10:23 AM

## 2023-10-16 ENCOUNTER — Other Ambulatory Visit: Payer: Self-pay

## 2023-10-20 ENCOUNTER — Other Ambulatory Visit: Payer: Self-pay

## 2023-10-23 ENCOUNTER — Other Ambulatory Visit: Payer: Self-pay

## 2023-10-24 ENCOUNTER — Other Ambulatory Visit: Payer: Self-pay

## 2023-10-24 MED ORDER — LACOSAMIDE 150 MG PO TABS
150.0000 mg | ORAL_TABLET | Freq: Two times a day (BID) | ORAL | 0 refills | Status: DC
  Filled 2023-10-24: qty 60, 30d supply, fill #0

## 2023-10-24 MED ORDER — LISDEXAMFETAMINE DIMESYLATE 70 MG PO CAPS
70.0000 mg | ORAL_CAPSULE | Freq: Every morning | ORAL | 0 refills | Status: DC
  Filled 2023-10-24: qty 30, 30d supply, fill #0

## 2023-11-20 ENCOUNTER — Other Ambulatory Visit: Payer: Self-pay

## 2023-11-20 MED ORDER — LISDEXAMFETAMINE DIMESYLATE 70 MG PO CAPS
70.0000 mg | ORAL_CAPSULE | Freq: Every morning | ORAL | 0 refills | Status: DC
  Filled 2023-11-20 – 2023-12-18 (×2): qty 30, 30d supply, fill #0

## 2023-11-20 MED ORDER — LISDEXAMFETAMINE DIMESYLATE 70 MG PO CAPS: 70.0000 mg | ORAL_CAPSULE | Freq: Every morning | ORAL | 0 refills | Status: DC

## 2023-11-20 MED ORDER — LISDEXAMFETAMINE DIMESYLATE 70 MG PO CAPS
70.0000 mg | ORAL_CAPSULE | Freq: Every morning | ORAL | 0 refills | Status: AC
  Filled 2024-01-23: qty 30, 30d supply, fill #0

## 2023-12-01 ENCOUNTER — Ambulatory Visit: Admitting: Urology

## 2023-12-01 VITALS — BP 165/98 | HR 108

## 2023-12-01 DIAGNOSIS — N3946 Mixed incontinence: Secondary | ICD-10-CM

## 2023-12-01 LAB — MICROSCOPIC EXAMINATION
Bacteria, UA: NONE SEEN
WBC, UA: NONE SEEN /HPF (ref 0–5)

## 2023-12-01 LAB — URINALYSIS, COMPLETE
Bilirubin, UA: NEGATIVE
Glucose, UA: NEGATIVE
Ketones, UA: NEGATIVE
Leukocytes,UA: NEGATIVE
Nitrite, UA: NEGATIVE
Protein,UA: NEGATIVE
Specific Gravity, UA: <1.005 — ABNORMAL LOW (ref 1.005–1.030)
Urobilinogen, Ur: 0.2 mg/dL (ref 0.2–1.0)
pH, UA: 6 (ref 5.0–7.5)

## 2023-12-01 NOTE — Addendum Note (Signed)
 Addended byBETHA CORIE PLATER on: 12/01/2023 03:25 PM   Modules accepted: Orders

## 2023-12-01 NOTE — Progress Notes (Signed)
 12/01/2023 2:49 PM   Donna Vasquez 14-Feb-1966 [id number]  Referring provider: Harvey Gaetana CROME, NP [address] [place],  [place] [address]  No chief complaint on file.   HPI: I was consulted to assist the patient for recurrent bladder infections urinary incontinence and interstitial cystitis.  She is being followed and has a planned hysteroscopy for an abnormal Pap smear by Dr. Lovetta.    In the last 6 months the patient has had 4 positive cultures.  She can get cramping that worsens.  Sometimes she gets a foul-smelling urine.  No increased frequency.  Symptoms improved with antibiotics.  Many years ago in [place]  she describes a cystoscopy and being diagnosed with interstitial cystitis.  She was not put to sleep.  She was nonspecific but she tends to get cramps with her interstitial cystitis that goes away after taking Azo Standard   At baseline she can leak with coughing sneezing and sometimes with urgency.  No bedwetting.  Can leak when she walks and goes from sitting to standing position.  She wears 3-4 pads a day that are damp.  She noted ever since she was a teenager she can leak a small amount with laughing   She voids every 4-5 hours and gets up once a night.  Flow was reasonable and she does not feel empty   She has had injections of her neck with epidurals.   For the last 5 cultures have been positive.  She had a negative CT scan in September 2024.  She is has worsening hives with sulfa drugs    On pelvic examination she has had some bladder descensus at rest. Mild hypermobility of the bladder neck and negative cough test with moderate cough and no significant prolapse   Patient has recurrent bladder infections.  Pathophysiology discussed.  She has mild mixed incontinence.  She has a distant history of interstitial cystitis.  She understands if we can suppress her bladder infections her incontinence may down regulate some.  I like to have her come back  on Macrodantin  100 mg 30 x 11 for cystoscopy in approximately 6 weeks.  This can be done independent of her hysteroscopy.  If she still has incontinence affecting her quality of life I would then recommend urodynamics.  We would also look for evidence of interstitial cystitis.  I do not recommend a hydrodistention currently but I would look for evidence of bladder pain on bladder filling.  Call if culture positive Patient is having cramping now over the weekend and I thought it was best to give her an antibiotic.  Based upon previous culture I called and Keflex  500 mg 3 times a day for 7 days.  Then patient was started on daily Macrodantin .  Reassess in 8 weeks for cystoscopy Urodynamics described in detail  Today Frequency stable.  Last urine culture was positive Clinically infection free on daily Macrodantin  with mild decrease in incontinence.  Still using 4 pads a day.  She still can have urge incontinence and leak with walking activity  On pelvic examination patient had grade 2 hypermobility the bladder neck and a positive cough test after cystoscopy  Cystoscopy: Patient underwent flexible cystoscopy.  Bladder mucosa and trigone were normal.  She had increased vascularity within normal limits especially at high bladder volume.  No carcinoma.  No pain with bladder filling or insertion of cystoscope    PMH: Past Medical History:  Diagnosis Date   Allergy    Anxiety  Asthma    Back pain    Bulging lumbar disc    Chicken pox    Constipation    Depression    Epilepsy (HCC)    Fatigue    Frequent headaches    Gall bladder disease    GERD (gastroesophageal reflux disease)    Glaucoma suspect    Headache    Heartburn    Hyperlipidemia    Hypertension    IBS (irritable bowel syndrome)    Interstitial cystitis    Lumbago    Palpitations    Plantar fasciitis    RA (rheumatoid arthritis) (HCC)    Rheumatoid arthritis (HCC)    Shortness of breath on exertion    Sleep apnea     Staring episodes    Trouble in sleeping    Urine incontinence    Vertigo    Vision changes    Vitamin D  deficiency     Surgical History: Past Surgical History:  Procedure Laterality Date   CHOLECYSTECTOMY     COLONOSCOPY WITH PROPOFOL  N/A 05/16/2023   Procedure: COLONOSCOPY WITH PROPOFOL ;  Surgeon: Maryruth Ole DASEN, MD;  Location: [facility];  Service: Gastroenterology;  Laterality: N/A;   cyst removed from back     HYSTEROSCOPY WITH D & C N/A 08/22/2023   Procedure: DILATATION AND CURETTAGE /HYSTEROSCOPY;  Surgeon: Schermerhorn, Beverli GAILS, MD;  Location: [facility] ORS;  Service: Gynecology;  Laterality: N/A;  POSSIBLE MYOSURE, POLYPECTOMY   NASAL SINUS SURGERY     TUBAL LIGATION  1999    Home Medications:  Allergies as of 12/01/2023       Reactions   Flonase [fluticasone  Propionate] Other (See Comments)   Nasal irritation, burning, but can tolerate advair  inhaler.     Sulfa Antibiotics Hives   Wound Dressing Adhesive Itching        Medication List        Accurate as of December 01, 2023  2:49 PM. If you have any questions, ask your nurse or doctor.          acetaminophen  500 MG tablet Commonly known as: TYLENOL  Take 1,000 mg by mouth every 6 (six) hours as needed for moderate pain (pain score 4-6).   albuterol  108 (90 Base) MCG/ACT inhaler Commonly known as: VENTOLIN  HFA Inhale 2 puffs into the lungs every 4 (four) hours as needed for wheezing   aspirin 81 MG tablet Take 81 mg by mouth daily.   cyanocobalamin  1000 MCG tablet Commonly known as: VITAMIN B12 Take 1,000 mcg by mouth daily.   estradiol  0.1 MG/GM vaginal cream Commonly known as: ESTRACE  Place 1 g (1/4 applicatorful) vaginally 2 (two) times a week.   fluticasone -salmeterol 250-50 MCG/ACT Aepb Commonly known as: ADVAIR  Inhale 1 puff into the lungs daily.   Lacosamide  150 MG Tabs Take 1 tablet (150 mg total) by mouth 2 (two) times daily.   lisdexamfetamine 70 MG capsule Commonly known as:  VYVANSE  Take 1 capsule (70 mg total) by mouth every morning.   lisdexamfetamine 70 MG capsule Commonly known as: VYVANSE  Take 1 capsule (70 mg total) by mouth in the morning.   lisdexamfetamine 70 MG capsule Commonly known as: VYVANSE  Take 1 capsule (70 mg total) by mouth every morning.   lisdexamfetamine 70 MG capsule Commonly known as: VYVANSE  Take 1 capsule (70 mg total) by mouth every morning. Start taking on: December 20, 2023   lisdexamfetamine 70 MG capsule Commonly known as: VYVANSE  Take 1 capsule (70 mg total) by mouth every morning. Start taking  on: January 20, 2024   losartan  25 MG tablet Commonly known as: COZAAR  Take 1 tablet (25 mg total) by mouth daily.   montelukast  10 MG tablet Commonly known as: SINGULAIR  Take 1 tablet (10 mg total) by mouth daily.   niacin  1000 MG CR tablet Commonly known as: NIASPAN  Take 1 tablet (1,000 mg total) by mouth at bedtime.   nitrofurantoin  100 MG capsule Commonly known as: Macrodantin  Take 1 capsule (100 mg total) by mouth daily.   ondansetron  4 MG tablet Commonly known as: ZOFRAN  Take 1 tablet (4 mg total) by mouth every 8 (eight) hours as needed for Nausea for up to 7 days   sertraline  100 MG tablet Commonly known as: ZOLOFT  Take 1 tablet (100 mg total) by mouth daily.   simvastatin  20 MG tablet Commonly known as: ZOCOR  Take 1 tablet (20 mg total) by mouth at bedtime.   traZODone  100 MG tablet Commonly known as: DESYREL  Take 1 tablet (100 mg total) by mouth at bedtime.   Vitamin D3 250 MCG (10000 UT) capsule Take 10,000 Units by mouth every Monday, Tuesday, Wednesday, Thursday, and Friday.        Allergies:  Allergies  Allergen Reactions   Flonase [Fluticasone  Propionate] Other (See Comments)    Nasal irritation, burning, but can tolerate advair  inhaler.     Sulfa Antibiotics Hives   Wound Dressing Adhesive Itching    Family History: Family History  Problem Relation Age of Onset   COPD Mother     Heart disease Mother    Polymyalgia rheumatica Mother    Emphysema Mother    Stroke Mother    Depression Mother    Anxiety disorder Mother    Heart disease Father    Hypertension Father    Hyperlipidemia Father    Anxiety disorder Father    Cancer Paternal Aunt        Breast   Breast cancer Paternal Aunt 28   Stroke Maternal Grandmother    Cancer Maternal Aunt    Other Brother        melas syndrom   Parkinson's disease Maternal Grandfather     Social History:  reports that she has never smoked. She has never been exposed to tobacco smoke. She has never used smokeless tobacco. She reports that she does not currently use alcohol. She reports that she does not use drugs.  ROS:                                        Physical Exam: LMP 08/19/2019   Constitutional:  Alert and oriented, No acute distress. HEENT: [place] AT, moist mucus membranes.  Trachea midline, no masses.   Laboratory Data: Lab Results  Component Value Date   WBC 6.2 08/18/2023   HGB 13.5 08/18/2023   HCT 39.7 08/18/2023   MCV 94.7 08/18/2023   PLT 221 08/18/2023    Lab Results  Component Value Date   CREATININE 0.75 08/18/2023    No results found for: PSA  No results found for: TESTOSTERONE  Lab Results  Component Value Date   HGBA1C 5.0 12/13/2021    Urinalysis    Component Value Date/Time   COLORURINE YELLOW (A) 02/05/2023 1045   APPEARANCEUR Cloudy (A) 08/04/2023 1327   LABSPEC 1.020 02/05/2023 1045   PHURINE 7.0 02/05/2023 1045   GLUCOSEU Negative 08/04/2023 1327   HGBUR NEGATIVE 02/05/2023 1045   BILIRUBINUR Negative  08/04/2023 1327   KETONESUR NEGATIVE 02/05/2023 1045   PROTEINUR Trace 08/04/2023 1327   PROTEINUR NEGATIVE 02/05/2023 1045   UROBILINOGEN 0.2 12/16/2014 1532   NITRITE Positive (A) 08/04/2023 1327   NITRITE NEGATIVE 02/05/2023 1045   LEUKOCYTESUR 3+ (A) 08/04/2023 1327   LEUKOCYTESUR NEGATIVE 02/05/2023 1045    Pertinent Imaging: Urine  aspirated and sent for culture  Assessment & Plan: Patient has mixed incontinence with a vague history of interstitial cystitis.  I will order urodynamics and proceed accordingly.  She did have a positive cough test today.  Call if urine culture positive  1. Mixed incontinence (Primary)  - Urinalysis, Complete   No follow-ups on file.  Glendia DELENA Elizabeth, MD  [facility] [address] [place], [place] [address] [phone]

## 2023-12-02 ENCOUNTER — Other Ambulatory Visit: Payer: Self-pay

## 2023-12-08 ENCOUNTER — Other Ambulatory Visit: Payer: Self-pay

## 2023-12-08 MED ORDER — ESTRADIOL 0.1 MG/GM VA CREA
1.0000 g | TOPICAL_CREAM | VAGINAL | 1 refills | Status: DC
  Filled 2023-12-08: qty 42.5, 90d supply, fill #0

## 2023-12-11 ENCOUNTER — Other Ambulatory Visit: Payer: Self-pay

## 2023-12-12 ENCOUNTER — Other Ambulatory Visit: Payer: Self-pay

## 2023-12-12 MED ORDER — LACOSAMIDE 150 MG PO TABS
150.0000 mg | ORAL_TABLET | Freq: Two times a day (BID) | ORAL | 0 refills | Status: DC
  Filled 2023-12-12: qty 60, 30d supply, fill #0

## 2023-12-15 ENCOUNTER — Other Ambulatory Visit: Payer: Self-pay

## 2023-12-15 MED ORDER — LACOSAMIDE 150 MG PO TABS
150.0000 mg | ORAL_TABLET | Freq: Every day | ORAL | 3 refills | Status: AC
  Filled 2023-12-15 – 2024-02-27 (×2): qty 30, 30d supply, fill #0
  Filled 2024-04-02: qty 30, 30d supply, fill #1
  Filled 2024-05-07: qty 30, 30d supply, fill #2

## 2023-12-18 ENCOUNTER — Other Ambulatory Visit: Payer: Self-pay

## 2023-12-30 ENCOUNTER — Other Ambulatory Visit: Payer: Self-pay

## 2023-12-30 MED ORDER — PROMETHAZINE-DM 6.25-15 MG/5ML PO SYRP
5.0000 mL | ORAL_SOLUTION | Freq: Four times a day (QID) | ORAL | 0 refills | Status: DC | PRN
  Filled 2023-12-30: qty 120, 6d supply, fill #0

## 2023-12-30 MED ORDER — PREDNISONE 20 MG PO TABS
20.0000 mg | ORAL_TABLET | Freq: Every day | ORAL | 0 refills | Status: AC
  Filled 2023-12-30: qty 5, 5d supply, fill #0

## 2023-12-30 MED ORDER — DOXYCYCLINE HYCLATE 100 MG PO CAPS
100.0000 mg | ORAL_CAPSULE | Freq: Two times a day (BID) | ORAL | 0 refills | Status: DC
  Filled 2023-12-30: qty 14, 7d supply, fill #0

## 2023-12-31 ENCOUNTER — Other Ambulatory Visit: Payer: Self-pay

## 2023-12-31 MED ORDER — SERTRALINE HCL 100 MG PO TABS
100.0000 mg | ORAL_TABLET | Freq: Every day | ORAL | 1 refills | Status: AC
  Filled 2023-12-31 (×2): qty 90, 90d supply, fill #0
  Filled 2024-03-31: qty 90, 90d supply, fill #1

## 2024-01-01 ENCOUNTER — Other Ambulatory Visit: Payer: Self-pay

## 2024-01-01 MED ORDER — PROGESTERONE MICRONIZED 100 MG PO CAPS
100.0000 mg | ORAL_CAPSULE | Freq: Every evening | ORAL | 6 refills | Status: AC
  Filled 2024-01-01: qty 30, 30d supply, fill #0
  Filled 2024-01-23: qty 90, 90d supply, fill #1
  Filled 2024-05-07 (×2): qty 90, 90d supply, fill #2

## 2024-01-10 ENCOUNTER — Emergency Department

## 2024-01-10 ENCOUNTER — Other Ambulatory Visit: Payer: Self-pay

## 2024-01-10 ENCOUNTER — Encounter: Payer: Self-pay | Admitting: *Deleted

## 2024-01-10 ENCOUNTER — Emergency Department
Admission: EM | Admit: 2024-01-10 | Discharge: 2024-01-10 | Disposition: A | Discharge status: Home / Self Care | Attending: Emergency Medicine | Admitting: Emergency Medicine

## 2024-01-10 DIAGNOSIS — J45909 Unspecified asthma, uncomplicated: Secondary | ICD-10-CM | POA: Diagnosis not present

## 2024-01-10 DIAGNOSIS — N132 Hydronephrosis with renal and ureteral calculous obstruction: Secondary | ICD-10-CM | POA: Diagnosis not present

## 2024-01-10 DIAGNOSIS — I10 Essential (primary) hypertension: Secondary | ICD-10-CM | POA: Insufficient documentation

## 2024-01-10 DIAGNOSIS — N201 Calculus of ureter: Secondary | ICD-10-CM

## 2024-01-10 DIAGNOSIS — R1032 Left lower quadrant pain: Secondary | ICD-10-CM | POA: Diagnosis present

## 2024-01-10 LAB — CBC
HCT: 39.2 % (ref 36.0–46.0)
Hemoglobin: 13.6 g/dL (ref 12.0–15.0)
MCH: 32.5 pg (ref 26.0–34.0)
MCHC: 34.7 g/dL (ref 30.0–36.0)
MCV: 93.6 fL (ref 80.0–100.0)
Platelets: 211 K/uL (ref 150–400)
RBC: 4.19 MIL/uL (ref 3.87–5.11)
RDW: 12.5 % (ref 11.5–15.5)
WBC: 8.1 K/uL (ref 4.0–10.5)
nRBC: 0 % (ref 0.0–0.2)

## 2024-01-10 LAB — BASIC METABOLIC PANEL WITH GFR
Anion gap: 16 — ABNORMAL HIGH (ref 5–15)
BUN: 16 mg/dL (ref 6–20)
CO2: 22 mmol/L (ref 22–32)
Calcium: 9.7 mg/dL (ref 8.9–10.3)
Chloride: 105 mmol/L (ref 98–111)
Creatinine, Ser: 0.97 mg/dL (ref 0.44–1.00)
GFR, Estimated: >60 mL/min (ref >60)
Glucose, Bld: 144 mg/dL — ABNORMAL HIGH (ref 70–99)
Potassium: 3.7 mmol/L (ref 3.5–5.1)
Sodium: 143 mmol/L (ref 135–145)

## 2024-01-10 LAB — URINALYSIS, ROUTINE W REFLEX MICROSCOPIC
Bilirubin Urine: NEGATIVE
Glucose, UA: NEGATIVE mg/dL
Ketones, ur: 5 mg/dL — AB
Leukocytes,Ua: NEGATIVE
Nitrite: NEGATIVE
Protein, ur: NEGATIVE mg/dL
Specific Gravity, Urine: 1.012 (ref 1.005–1.030)
pH: 9 — ABNORMAL HIGH (ref 5.0–8.0)

## 2024-01-10 MED ORDER — SODIUM CHLORIDE 0.9 % IV BOLUS
1000.0000 mL | Freq: Once | INTRAVENOUS | Status: AC
  Administered 2024-01-10: 1000 mL via INTRAVENOUS

## 2024-01-10 MED ORDER — KETOROLAC TROMETHAMINE 15 MG/ML IJ SOLN
15.0000 mg | Freq: Once | INTRAMUSCULAR | Status: AC
  Administered 2024-01-10: 15 mg via INTRAVENOUS
  Filled 2024-01-10: qty 1

## 2024-01-10 MED ORDER — MORPHINE SULFATE (PF) 4 MG/ML IV SOLN
4.0000 mg | Freq: Once | INTRAVENOUS | Status: AC
  Administered 2024-01-10: 4 mg via INTRAVENOUS
  Filled 2024-01-10: qty 1

## 2024-01-10 MED ORDER — OXYCODONE-ACETAMINOPHEN 5-325 MG PO TABS: 2.0000 | ORAL_TABLET | Freq: Four times a day (QID) | ORAL | 0 refills | Status: AC | PRN

## 2024-01-10 MED ORDER — PANTOPRAZOLE SODIUM 40 MG IV SOLR
40.0000 mg | Freq: Once | INTRAVENOUS | Status: AC
  Administered 2024-01-10: 40 mg via INTRAVENOUS
  Filled 2024-01-10: qty 10

## 2024-01-10 MED ORDER — SODIUM CHLORIDE 0.9 % IV SOLN
150.0000 mg | Freq: Once | INTRAVENOUS | Status: AC
Start: 2024-01-10 — End: 2024-01-10
  Administered 2024-01-10: 150 mg via INTRAVENOUS
  Filled 2024-01-10: qty 7.5

## 2024-01-10 MED ORDER — ONDANSETRON HCL 4 MG/2ML IJ SOLN
4.0000 mg | Freq: Once | INTRAMUSCULAR | Status: AC
  Administered 2024-01-10: 4 mg via INTRAVENOUS
  Filled 2024-01-10: qty 2

## 2024-01-10 MED ORDER — OXYCODONE-ACETAMINOPHEN 5-325 MG PO TABS
2.0000 | ORAL_TABLET | Freq: Once | ORAL | Status: AC
  Administered 2024-01-10: 2 via ORAL
  Filled 2024-01-10: qty 2

## 2024-01-10 MED ORDER — TAMSULOSIN HCL 0.4 MG PO CAPS
0.4000 mg | ORAL_CAPSULE | ORAL | Status: AC
  Administered 2024-01-10: 0.4 mg via ORAL
  Filled 2024-01-10: qty 1

## 2024-01-10 MED ORDER — HYDROMORPHONE HCL 1 MG/ML IJ SOLN
1.0000 mg | Freq: Once | INTRAMUSCULAR | Status: AC
  Administered 2024-01-10: 1 mg via INTRAVENOUS
  Filled 2024-01-10: qty 1

## 2024-01-10 NOTE — ED Triage Notes (Signed)
 Pt is here for severe left flank pain which began suddenly around 2:30.  Pt is drenched in sweat, has been having nausea and dry heaves.  Pt is in obvious distress in triage.

## 2024-01-10 NOTE — ED Provider Notes (Signed)
 [facility] Provider Note    Event Date/Time   First MD Initiated Contact with Patient 01/10/24 1619     (approximate)   History   Chief Complaint: Flank Pain   HPI  Donna Vasquez is a 58 y.o. female who comes ED complaining of left flank pain radiating around to left lower quadrant that started suddenly at 2:30 PM today.  She was in her usual state of health prior to that.  Denies history of kidney stones but does have a strong family history of them.  Also has chronic bladder issues.  Denies chest pain shortness of breath fever.  Has had a few episodes of vomiting related to the pain.        Past Medical History:  Diagnosis Date   Allergy    Anxiety    Asthma    Back pain    Bulging lumbar disc    Chicken pox    Constipation    Depression    Epilepsy (HCC)    Fatigue    Frequent headaches    Gall bladder disease    GERD (gastroesophageal reflux disease)    Glaucoma suspect    Headache    Heartburn    Hyperlipidemia    Hypertension    IBS (irritable bowel syndrome)    Interstitial cystitis    Lumbago    Palpitations    Plantar fasciitis    RA (rheumatoid arthritis) (HCC)    Rheumatoid arthritis (HCC)    Shortness of breath on exertion    Sleep apnea    Staring episodes    Trouble in sleeping    Urine incontinence    Vertigo    Vision changes    Vitamin D  deficiency     Current Outpatient Rx   Order #: [id number] Class: Normal   Order #: [id number] Class: Historical Med   Order #: [id number] Class: Normal   Order #: [id number] Class: Historical Med   Order #: [id number] Class: Historical Med   Order #: [id number] Class: Historical Med   Order #: [id number] Class: Normal   Order #: [id number] Class: Normal   Order #: [id number] Class: Normal   Order #: [id number] Class: Normal   Order #: [id number] Class: Normal   Order #: [id number] Class: Normal   Order #: [id number] Class: Normal   [START ON 01/20/2024] Order #: [id number] Class: Normal    Order #: [id number] Class: Normal   Order #: [id number] Class: Normal   Order #: [id number] Class: Normal   Order #: [id number] Class: Normal   Order #: [id number] Class: Normal   Order #: [id number] Class: Normal   Order #: [id number] Class: Normal   Order #: [id number] Class: Normal   Order #: [id number] Class: Normal   Order #: [id number] Class: Normal   Order #: [id number] Class: Normal   Order #: [id number] Class: Normal    Past Surgical History:  Procedure Laterality Date   CHOLECYSTECTOMY     COLONOSCOPY WITH PROPOFOL  N/A 05/16/2023   Procedure: COLONOSCOPY WITH PROPOFOL ;  Surgeon: Maryruth Ole DASEN, MD;  Location: [facility] ENDOSCOPY;  Service: Gastroenterology;  Laterality: N/A;   cyst removed from back     HYSTEROSCOPY WITH D & C N/A 08/22/2023   Procedure: DILATATION AND CURETTAGE /HYSTEROSCOPY;  Surgeon: Schermerhorn, Beverli GAILS, MD;  Location: [facility] ORS;  Service: Gynecology;  Laterality: N/A;  POSSIBLE MYOSURE, POLYPECTOMY   NASAL SINUS SURGERY     TUBAL LIGATION  1999    Physical Exam   Triage Vital Signs: ED Triage Vitals  Encounter Vitals Group     BP 01/10/24 1603 (!) 167/120  Girls Systolic BP Percentile --      Girls Diastolic BP Percentile --      Boys Systolic BP Percentile --      Boys Diastolic BP Percentile --      Pulse Rate 01/10/24 1603 74     Resp 01/10/24 1603 18     Temp 01/10/24 1606 97.9 F (36.6 C)     Temp Source 01/10/24 1606 Oral     SpO2 01/10/24 1603 100 %     Weight --      Height --      Head Circumference --      Peak Flow --      Pain Score 01/10/24 1606 10     Pain Loc --      Pain Education --      Exclude from Growth Chart --     Most recent vital signs: Vitals:   01/10/24 2034 01/10/24 2200  BP: (!) 158/71 136/69  Pulse: (!) 58 66  Resp: 20   Temp: 98 F (36.7 C)   SpO2: 100% 100%    General: Awake, no distress.  CV:  Good peripheral perfusion.  Regular rate rhythm Resp:  Normal effort.  Abd:  No distention.  Left CVA tenderness and  left lower quadrant tenderness.  No peritoneal signs Other:  Diaphoretic   ED Results / Procedures / Treatments   Labs (all labs ordered are listed, but only abnormal results are displayed) Labs Reviewed  URINALYSIS, ROUTINE W REFLEX MICROSCOPIC - Abnormal; Notable for the following components:      Result Value   Color, Urine STRAW (*)    APPearance CLEAR (*)    pH 9.0 (*)    Hgb urine dipstick SMALL (*)    Ketones, ur 5 (*)    Bacteria, UA RARE (*)    All other components within normal limits  BASIC METABOLIC PANEL WITH GFR - Abnormal; Notable for the following components:   Glucose, Bld 144 (*)    Anion gap 16 (*)    All other components within normal limits  CBC     EKG    RADIOLOGY CT abdomen pelvis interpreted by me, shows a 2 mm stone in distal left ureter at the UVJ.  No significant obstructive nephropathy.   PROCEDURES:  Procedures   MEDICATIONS ORDERED IN ED: Medications  ondansetron  (ZOFRAN ) injection 4 mg (4 mg Intravenous Given 01/10/24 1625)  morphine  (PF) 4 MG/ML injection 4 mg (4 mg Intravenous Given 01/10/24 1625)  ketorolac  (TORADOL ) 15 MG/ML injection 15 mg (15 mg Intravenous Given 01/10/24 1625)  sodium chloride  0.9 % bolus 1,000 mL (0 mLs Intravenous Stopped 01/10/24 1927)  lidocaine  (XYLOCAINE ) 150 mg in sodium chloride  0.9 % 100 mL IVPB (0 mg Intravenous Stopped 01/10/24 1900)  HYDROmorphone  (DILAUDID ) injection 1 mg (1 mg Intravenous Given 01/10/24 2017)  ondansetron  (ZOFRAN ) injection 4 mg (4 mg Intravenous Given 01/10/24 2016)  tamsulosin  (FLOMAX ) capsule 0.4 mg (0.4 mg Oral Given 01/10/24 2016)  sodium chloride  0.9 % bolus 1,000 mL (1,000 mLs Intravenous New Bag/Given 01/10/24 2015)  pantoprazole  (PROTONIX ) injection 40 mg (40 mg Intravenous Given 01/10/24 2016)  oxyCODONE -acetaminophen  (PERCOCET/ROXICET) 5-325 MG per tablet 2 tablet (2 tablets Oral Given 01/10/24 2225)     IMPRESSION / MDM / ASSESSMENT AND PLAN / ED COURSE  I reviewed the  triage vital signs and the nursing notes.  DDx: Kidney stone, bowel obstruction, internal hernia, pancreatitis, diverticulitis  Patient's presentation is most consistent with acute presentation with  potential threat to life or bodily function.  Patient presents with severe left flank pain radiating to the left lower quadrant.  Vital signs unremarkable.  Suspect kidney stone.  Will check labs, obtain CT.  Give morphine  Zofran  Toradol  for symptom relief.   Clinical Course as of 01/10/24 2300  Sat Jan 10, 2024  1939 Still in severe pain, will need additional medications. [PS]  2300 Pain much improved, now controlled and 0 out of 10 after oral oxycodone .  Stable for discharge home, counseled on pain medication strategy at home. [PS]    Clinical Course User Index [PS] Viviann Pastor, MD     FINAL CLINICAL IMPRESSION(S) / ED DIAGNOSES   Final diagnoses:  Ureterolithiasis     Rx / DC Orders   ED Discharge Orders          Ordered    oxyCODONE -acetaminophen  (PERCOCET) 5-325 MG tablet  Every 6 hours PRN        01/10/24 2258             Note:  This document was prepared using Dragon voice recognition software and may include unintentional dictation errors.   Viviann Pastor, MD 01/10/24 [phone]

## 2024-01-12 ENCOUNTER — Other Ambulatory Visit: Payer: Self-pay

## 2024-01-12 ENCOUNTER — Telehealth: Payer: Self-pay | Admitting: Physician Assistant

## 2024-01-12 DIAGNOSIS — N3020 Other chronic cystitis without hematuria: Secondary | ICD-10-CM

## 2024-01-12 NOTE — Telephone Encounter (Signed)
 Order in.

## 2024-01-12 NOTE — Telephone Encounter (Signed)
 Patient is scheduled for an ED follow up for Kidney Stones on Tuesday 01/20/24 at 10:20 am. Patient is aware to get KUB done at least an hour prior. Please ensure order is in.

## 2024-01-20 ENCOUNTER — Ambulatory Visit: Admitting: Physician Assistant

## 2024-01-20 ENCOUNTER — Other Ambulatory Visit: Payer: Self-pay

## 2024-01-20 ENCOUNTER — Ambulatory Visit
Admission: RE | Admit: 2024-01-20 | Discharge: 2024-01-20 | Disposition: A | Discharge status: Home / Self Care | Attending: Physician Assistant | Admitting: Physician Assistant

## 2024-01-20 ENCOUNTER — Ambulatory Visit: Admitting: Urology

## 2024-01-20 ENCOUNTER — Ambulatory Visit
Admission: RE | Admit: 2024-01-20 | Discharge: 2024-01-20 | Disposition: A | Discharge status: Home / Self Care | Source: Ambulatory Visit | Attending: Physician Assistant | Admitting: Physician Assistant

## 2024-01-20 VITALS — BP 149/83 | HR 95 | Ht 67.0 in | Wt 246.0 lb

## 2024-01-20 DIAGNOSIS — N3020 Other chronic cystitis without hematuria: Secondary | ICD-10-CM

## 2024-01-20 DIAGNOSIS — N201 Calculus of ureter: Secondary | ICD-10-CM | POA: Diagnosis not present

## 2024-01-20 LAB — URINALYSIS, COMPLETE
Bilirubin, UA: NEGATIVE
Glucose, UA: NEGATIVE
Leukocytes,UA: NEGATIVE
Nitrite, UA: NEGATIVE
Protein,UA: NEGATIVE
RBC, UA: NEGATIVE
Specific Gravity, UA: 1.03 (ref 1.005–1.030)
Urobilinogen, Ur: 0.2 mg/dL (ref 0.2–1.0)
pH, UA: 6 (ref 5.0–7.5)

## 2024-01-20 LAB — MICROSCOPIC EXAMINATION: Epithelial Cells (non renal): >10 /HPF — AB (ref 0–10)

## 2024-01-20 MED ORDER — TAMSULOSIN HCL 0.4 MG PO CAPS
0.4000 mg | ORAL_CAPSULE | Freq: Every day | ORAL | 0 refills | Status: AC
  Filled 2024-01-20: qty 30, 30d supply, fill #0

## 2024-01-20 NOTE — Patient Instructions (Addendum)
 For the next 3 weeks, please do the following: -Stay well hydrated -Strain your urine to catch any stones that pass -Take Flomax  0.4mg  daily -Treat any pain with Advil (ibuprofen), Tylenol  (acetaminophen ), or Percocet (oxycodone -acetaminophen ) -Treat any nausea with Zofran  (ondansetron )  I will plan to see you back in clinic in 3 weeks with another x-ray prior to see if you have passed your stone.  Please call our office immediately (we are open 8a-5p Monday-Thursday, 8a-12p Friday) or go to the Emergency Department if you develop any of the following: -Fever/chills -Nausea and/or vomiting uncontrollable with Zofran  -Pain uncontrollable with Percocet

## 2024-01-20 NOTE — Progress Notes (Unsigned)
 01/20/2024 10:39 AM   Donna Vasquez 1966-03-03 [id number]  CC: Chief Complaint  Patient presents with   Follow-up   Establish Care   Nephrolithiasis   HPI: Donna Vasquez is a 58 y.o. female with PMH recurrent UTI, urinary incontinence, and IC who presents today for ED follow-up of a 2 mm left UVJ stone.   She was seen in the ED 10 days ago with reports of severe left flank pain, nausea, and dry heaves.  UA showed 11-20 RBC/hpf and rare bacteria.  CT stone study showed mild left hydroureteronephrosis to the level of a 2 mm left UVJ stone.  No other urolithiasis.  She has no personal history of nephrolithiasis, but a strong family history of these.  Today she reports her pain is resolved, though she never saw a stone pass.  KUB today with a small left hemipelvic calcification likely representing retained UVJ stone.  In-office UA today positive for trace ketones; urine microscopy with >10 epithelial cells/hpf and moderate bacteria.  PMH: Past Medical History:  Diagnosis Date   Allergy    Anxiety    Asthma    Back pain    Bulging lumbar disc    Chicken pox    Constipation    Depression    Epilepsy (HCC)    Fatigue    Frequent headaches    Gall bladder disease    GERD (gastroesophageal reflux disease)    Glaucoma suspect    Headache    Heartburn    Hyperlipidemia    Hypertension    IBS (irritable bowel syndrome)    Interstitial cystitis    Lumbago    Palpitations    Plantar fasciitis    RA (rheumatoid arthritis) (HCC)    Rheumatoid arthritis (HCC)    Shortness of breath on exertion    Sleep apnea    Staring episodes    Trouble in sleeping    Urine incontinence    Vertigo    Vision changes    Vitamin D  deficiency     Surgical History: Past Surgical History:  Procedure Laterality Date   CHOLECYSTECTOMY     COLONOSCOPY WITH PROPOFOL  N/A 05/16/2023   Procedure: COLONOSCOPY WITH PROPOFOL ;  Surgeon: Maryruth Ole DASEN, MD;  Location: [facility] ENDOSCOPY;   Service: Gastroenterology;  Laterality: N/A;   cyst removed from back     HYSTEROSCOPY WITH D & C N/A 08/22/2023   Procedure: DILATATION AND CURETTAGE /HYSTEROSCOPY;  Surgeon: Schermerhorn, Beverli GAILS, MD;  Location: [facility] ORS;  Service: Gynecology;  Laterality: N/A;  POSSIBLE MYOSURE, POLYPECTOMY   NASAL SINUS SURGERY     TUBAL LIGATION  1999    Home Medications:  Allergies as of 01/20/2024       Reactions   Flonase [fluticasone  Propionate] Other (See Comments)   Nasal irritation, burning, but can tolerate advair  inhaler.     Sulfa Antibiotics Hives   Wound Dressing Adhesive Itching        Medication List        Accurate as of January 20, 2024 10:39 AM. If you have any questions, ask your nurse or doctor.          STOP taking these medications    doxycycline  100 MG capsule Commonly known as: VIBRAMYCIN    ondansetron  4 MG tablet Commonly known as: ZOFRAN    promethazine -dextromethorphan 6.25-15 MG/5ML syrup Commonly known as: PROMETHAZINE -DM       TAKE these medications    acetaminophen  500 MG tablet Commonly known as: TYLENOL  Take 1,000  mg by mouth every 6 (six) hours as needed for moderate pain (pain score 4-6).   albuterol  108 (90 Base) MCG/ACT inhaler Commonly known as: VENTOLIN  HFA Inhale 2 puffs into the lungs every 4 (four) hours as needed for wheezing   aspirin 81 MG tablet Take 81 mg by mouth daily.   cyanocobalamin  1000 MCG tablet Commonly known as: VITAMIN B12 Take 1,000 mcg by mouth daily.   estradiol  0.1 MG/GM vaginal cream Commonly known as: ESTRACE  Place 1 g (1/4 applicatorful) vaginally 2 (two) times a week.   estradiol  0.1 MG/GM vaginal cream Commonly known as: ESTRACE  Place 1 g vaginally 2 (two) times a week.   fluticasone -salmeterol 250-50 MCG/ACT Aepb Commonly known as: ADVAIR  Inhale 1 puff into the lungs daily. What changed:  when to take this reasons to take this   Lacosamide  150 MG Tabs Take 1 tablet (150 mg total) by  mouth at bedtime.   lisdexamfetamine 70 MG capsule Commonly known as: VYVANSE  Take 1 capsule (70 mg total) by mouth every morning.   lisdexamfetamine 70 MG capsule Commonly known as: VYVANSE  Take 1 capsule (70 mg total) by mouth in the morning.   lisdexamfetamine 70 MG capsule Commonly known as: VYVANSE  Take 1 capsule (70 mg total) by mouth every morning.   lisdexamfetamine 70 MG capsule Commonly known as: VYVANSE  Take 1 capsule (70 mg total) by mouth every morning.   lisdexamfetamine 70 MG capsule Commonly known as: VYVANSE  Take 1 capsule (70 mg total) by mouth every morning.   losartan  25 MG tablet Commonly known as: COZAAR  Take 1 tablet (25 mg total) by mouth daily.   montelukast  10 MG tablet Commonly known as: SINGULAIR  Take 1 tablet (10 mg total) by mouth daily.   niacin  1000 MG CR tablet Commonly known as: NIASPAN  Take 1 tablet (1,000 mg total) by mouth at bedtime.   nitrofurantoin  100 MG capsule Commonly known as: Macrodantin  Take 1 capsule (100 mg total) by mouth daily.   progesterone  100 MG capsule Commonly known as: PROMETRIUM  Take 1 capsule (100 mg total) by mouth at bedtime.   sertraline  100 MG tablet Commonly known as: ZOLOFT  Take 1 tablet (100 mg total) by mouth daily.   simvastatin  20 MG tablet Commonly known as: ZOCOR  Take 1 tablet (20 mg total) by mouth at bedtime.   traZODone  100 MG tablet Commonly known as: DESYREL  Take 1 tablet (100 mg total) by mouth at bedtime.   Vitamin D3 250 MCG (10000 UT) capsule Take 10,000 Units by mouth every Monday, Tuesday, Wednesday, Thursday, and Friday.        Allergies:  Allergies  Allergen Reactions   Flonase [Fluticasone  Propionate] Other (See Comments)    Nasal irritation, burning, but can tolerate advair  inhaler.     Sulfa Antibiotics Hives   Wound Dressing Adhesive Itching    Family History: Family History  Problem Relation Age of Onset   COPD Mother    Heart disease Mother     Polymyalgia rheumatica Mother    Emphysema Mother    Stroke Mother    Depression Mother    Anxiety disorder Mother    Heart disease Father    Hypertension Father    Hyperlipidemia Father    Anxiety disorder Father    Cancer Paternal Aunt        Breast   Breast cancer Paternal Aunt 21   Stroke Maternal Grandmother    Cancer Maternal Aunt    Other Brother        melas  syndrom   Parkinson's disease Maternal Grandfather     Social History:   reports that she has never smoked. She has never been exposed to tobacco smoke. She has never used smokeless tobacco. She reports that she does not currently use alcohol. She reports that she does not use drugs.  Physical Exam: BP (!) 149/83 (BP Location: Left Arm, Patient Position: Sitting, Cuff Size: Large)   Pulse 95   Ht 5' 7 (1.702 m)   Wt 246 lb (111.6 kg)   LMP 08/19/2019   SpO2 97%   BMI 38.53 kg/m   Constitutional:  Alert and oriented, no acute distress, nontoxic appearing HEENT: [place], AT Cardiovascular: No clubbing, cyanosis, or edema Respiratory: Normal respiratory effort, no increased work of breathing Skin: No rashes, bruises or suspicious lesions Neurologic: Grossly intact, no focal deficits, moving all 4 extremities Psychiatric: Normal mood and affect  Laboratory Data: Results for orders placed or performed in visit on 01/20/24  Microscopic Examination   Collection Time: 01/20/24 10:25 AM   Urine  Result Value Ref Range   WBC, UA 0-5 0 - 5 /hpf   RBC, Urine 0-2 0 - 2 /hpf   Epithelial Cells (non renal) >10 (A) 0 - 10 /hpf   Mucus, UA Present (A) Not Estab.   Bacteria, UA Moderate (A) None seen/Few  Urinalysis, Complete   Collection Time: 01/20/24 10:25 AM  Result Value Ref Range   Specific Gravity, UA 1.030 1.005 - 1.030   pH, UA 6.0 5.0 - 7.5   Color, UA Yellow Yellow   Appearance Ur Slightly cloudy Clear   Leukocytes,UA Negative Negative   Protein,UA Negative Negative/Trace   Glucose, UA Negative Negative    Ketones, UA Trace (A) Negative   RBC, UA Negative Negative   Bilirubin, UA Negative Negative   Urobilinogen, Ur 0.2 0.2 - 1.0 mg/dL   Nitrite, UA Negative Negative   Microscopic Examination Comment    Microscopic Examination See below:    Assessment & Plan:   1. Left ureteral stone (Primary) Currently asymptomatic and UA bland, however I do see a retained stone on her KUB today.  I recommended continued trial of passage given small stone size, and we discussed that the odds are strongly in her favor that she will be able to pass this on her own.  She is in agreement.  Will start Flomax  and see her back in 3 weeks with another KUB.  I gave her a strainer as well today. - Urinalysis, Complete - tamsulosin  (FLOMAX ) 0.4 MG CAPS capsule; Take 1 capsule (0.4 mg total) by mouth daily.  Dispense: 30 capsule; Refill: 0 - DG Abd 1 View; Future   Return in about 3 weeks (around 02/10/2024) for Stone f/u with UA + KUB prior.  Lucie Hones, PA-C  [facility] [address], [address] [place], [place] [address] [phone]

## 2024-01-23 ENCOUNTER — Other Ambulatory Visit: Payer: Self-pay

## 2024-01-24 ENCOUNTER — Other Ambulatory Visit: Payer: Self-pay

## 2024-01-25 ENCOUNTER — Other Ambulatory Visit: Payer: Self-pay

## 2024-01-26 ENCOUNTER — Other Ambulatory Visit: Payer: Self-pay

## 2024-01-26 MED ORDER — LOSARTAN POTASSIUM 25 MG PO TABS
25.0000 mg | ORAL_TABLET | Freq: Every day | ORAL | 1 refills | Status: AC
  Filled 2024-04-05: qty 90, 90d supply, fill #0
  Filled 2024-06-23 – 2024-06-24 (×2): qty 90, 90d supply, fill #1

## 2024-02-05 ENCOUNTER — Other Ambulatory Visit: Payer: Self-pay

## 2024-02-05 ENCOUNTER — Ambulatory Visit
Admission: RE | Admit: 2024-02-05 | Discharge: 2024-02-05 | Disposition: A | Discharge status: Home / Self Care | Attending: Physician Assistant | Admitting: Physician Assistant

## 2024-02-05 ENCOUNTER — Ambulatory Visit
Admission: RE | Admit: 2024-02-05 | Discharge: 2024-02-05 | Disposition: A | Discharge status: Home / Self Care | Source: Ambulatory Visit | Attending: Physician Assistant | Admitting: Physician Assistant

## 2024-02-05 DIAGNOSIS — N201 Calculus of ureter: Secondary | ICD-10-CM

## 2024-02-05 MED ORDER — SIMVASTATIN 20 MG PO TABS
20.0000 mg | ORAL_TABLET | Freq: Every day | ORAL | 1 refills | Status: AC
  Filled 2024-02-05: qty 90, 90d supply, fill #0
  Filled 2024-05-07 (×2): qty 90, 90d supply, fill #1

## 2024-02-05 MED ORDER — TRAZODONE HCL 100 MG PO TABS
100.0000 mg | ORAL_TABLET | Freq: Every day | ORAL | 1 refills | Status: AC
  Filled 2024-02-05 – 2024-03-22 (×2): qty 90, 90d supply, fill #0

## 2024-02-05 MED ORDER — ADVAIR DISKUS 250-50 MCG/ACT IN AEPB
1.0000 | INHALATION_SPRAY | Freq: Every day | RESPIRATORY_TRACT | 5 refills | Status: AC
  Filled 2024-02-05: qty 60, 30d supply, fill #0

## 2024-02-05 MED ORDER — ALBUTEROL SULFATE HFA 108 (90 BASE) MCG/ACT IN AERS
2.0000 | INHALATION_SPRAY | RESPIRATORY_TRACT | 1 refills | Status: AC | PRN
  Filled 2024-02-05: qty 6.7, 30d supply, fill #0

## 2024-02-06 ENCOUNTER — Other Ambulatory Visit: Payer: Self-pay | Admitting: Family Medicine

## 2024-02-06 DIAGNOSIS — Z1231 Encounter for screening mammogram for malignant neoplasm of breast: Secondary | ICD-10-CM

## 2024-02-10 ENCOUNTER — Other Ambulatory Visit: Payer: Self-pay

## 2024-02-10 MED ORDER — CEPHALEXIN 500 MG PO CAPS
500.0000 mg | ORAL_CAPSULE | Freq: Two times a day (BID) | ORAL | 0 refills | Status: AC
  Filled 2024-02-10: qty 14, 7d supply, fill #0

## 2024-02-12 ENCOUNTER — Ambulatory Visit: Admitting: Physician Assistant

## 2024-02-12 VITALS — BP 136/82 | HR 93 | Ht 67.0 in | Wt 246.0 lb

## 2024-02-12 DIAGNOSIS — N201 Calculus of ureter: Secondary | ICD-10-CM

## 2024-02-12 DIAGNOSIS — N390 Urinary tract infection, site not specified: Secondary | ICD-10-CM | POA: Diagnosis not present

## 2024-02-12 LAB — URINALYSIS, COMPLETE
Bilirubin, UA: NEGATIVE
Glucose, UA: NEGATIVE
Nitrite, UA: NEGATIVE
Protein,UA: NEGATIVE
RBC, UA: NEGATIVE
Specific Gravity, UA: 1.03 (ref 1.005–1.030)
Urobilinogen, Ur: 1 mg/dL (ref 0.2–1.0)
pH, UA: 5.5 (ref 5.0–7.5)

## 2024-02-12 LAB — MICROSCOPIC EXAMINATION
Epithelial Cells (non renal): >10 /HPF — AB (ref 0–10)
WBC, UA: >30 /HPF — AB (ref 0–5)

## 2024-02-12 NOTE — Progress Notes (Signed)
 02/12/2024 9:45 AM   Donna Vasquez 10/08/1965 [id number]  CC: Chief Complaint  Patient presents with   Follow-up   Nephrolithiasis   Establish Care   HPI: Donna Vasquez is a 58 y.o. female with PMH recurrent UTI, urinary incontinence, and IC who presents today for follow-up of a 2 mm left UVJ stone.   She saw her PCP 7 days ago.  She was having low back pain at that time.  UA notable for 8 WBC/hpf.  She was prescribed Keflex  500 mg twice daily x 7 days and urine culture grew ampicillin resistant E. coli.  Today she reports she has not yet started Keflex .  She is having persistent low back pain.  She does not think she has passed the stone yet.  No fevers, otherwise feeling fine.  She had a follow-up KUB on 02/05/2024 with possible persistent left UVJ stone, however on review of her prior CT there was a small phlebolith posterior to the stone, unclear if that is what is visualized on KUB.  In-office UA today positive for trace ketones and 2+ leukocytes; urine microscopy with >30 WBCs/HPF, >10 epithelial cells/hpf, and moderate bacteria.  PMH: Past Medical History:  Diagnosis Date   Allergy    Anxiety    Asthma    Back pain    Bulging lumbar disc    Chicken pox    Constipation    Depression    Epilepsy (HCC)    Fatigue    Frequent headaches    Gall bladder disease    GERD (gastroesophageal reflux disease)    Glaucoma suspect    Headache    Heartburn    Hyperlipidemia    Hypertension    IBS (irritable bowel syndrome)    Interstitial cystitis    Lumbago    Palpitations    Plantar fasciitis    RA (rheumatoid arthritis) (HCC)    Rheumatoid arthritis (HCC)    Shortness of breath on exertion    Sleep apnea    Staring episodes    Trouble in sleeping    Urine incontinence    Vertigo    Vision changes    Vitamin D  deficiency     Surgical History: Past Surgical History:  Procedure Laterality Date   CHOLECYSTECTOMY     COLONOSCOPY WITH PROPOFOL  N/A  05/16/2023   Procedure: COLONOSCOPY WITH PROPOFOL ;  Surgeon: Maryruth Ole DASEN, MD;  Location: [facility];  Service: Gastroenterology;  Laterality: N/A;   cyst removed from back     HYSTEROSCOPY WITH D & C N/A 08/22/2023   Procedure: DILATATION AND CURETTAGE /HYSTEROSCOPY;  Surgeon: Schermerhorn, Beverli GAILS, MD;  Location: [facility] ORS;  Service: Gynecology;  Laterality: N/A;  POSSIBLE MYOSURE, POLYPECTOMY   NASAL SINUS SURGERY     TUBAL LIGATION  1999    Home Medications:  Allergies as of 02/12/2024       Reactions   Flonase [fluticasone  Propionate] Other (See Comments)   Nasal irritation, burning, but can tolerate advair  inhaler.     Sulfa Antibiotics Hives   Wound Dressing Adhesive Itching        Medication List        Accurate as of February 12, 2024  9:45 AM. If you have any questions, ask your nurse or doctor.          STOP taking these medications    estradiol  0.1 MG/GM vaginal cream Commonly known as: ESTRACE        TAKE these medications    acetaminophen   500 MG tablet Commonly known as: TYLENOL  Take 1,000 mg by mouth every 6 (six) hours as needed for moderate pain (pain score 4-6).   albuterol  108 (90 Base) MCG/ACT inhaler Commonly known as: VENTOLIN  HFA Inhale 2 puffs into the lungs every 4 (four) hours as needed for Wheezing   aspirin 81 MG tablet Take 81 mg by mouth daily.   cephALEXin  500 MG capsule Commonly known as: KEFLEX  Take 1 capsule (500 mg total) by mouth 2 (two) times daily for 7 days For skin infection.   cyanocobalamin  1000 MCG tablet Commonly known as: VITAMIN B12 Take 1,000 mcg by mouth daily.   fluticasone -salmeterol 250-50 MCG/ACT Aepb Commonly known as: ADVAIR  Inhale 1 puff into the lungs daily. What changed:  when to take this reasons to take this   Advair  Diskus 250-50 MCG/ACT Aepb Generic drug: fluticasone -salmeterol Inhale 1 puff into the lungs daily. What changed: Another medication with the same name was changed.  Make sure you understand how and when to take each.   Lacosamide  150 MG Tabs Take 1 tablet (150 mg total) by mouth at bedtime.   lisdexamfetamine 70 MG capsule Commonly known as: VYVANSE  Take 1 capsule (70 mg total) by mouth every morning.   losartan  25 MG tablet Commonly known as: COZAAR  Take 1 tablet (25 mg total) by mouth daily.   montelukast  10 MG tablet Commonly known as: SINGULAIR  Take 1 tablet (10 mg total) by mouth daily.   niacin  1000 MG CR tablet Commonly known as: NIASPAN  Take 1 tablet (1,000 mg total) by mouth at bedtime.   nitrofurantoin  100 MG capsule Commonly known as: Macrodantin  Take 1 capsule (100 mg total) by mouth daily.   progesterone  100 MG capsule Commonly known as: PROMETRIUM  Take 1 capsule (100 mg total) by mouth at bedtime.   sertraline  100 MG tablet Commonly known as: ZOLOFT  Take 1 tablet (100 mg total) by mouth daily.   simvastatin  20 MG tablet Commonly known as: ZOCOR  Take 1 tablet (20 mg total) by mouth at bedtime.   tamsulosin  0.4 MG Caps capsule Commonly known as: FLOMAX  Take 1 capsule (0.4 mg total) by mouth daily.   traZODone  100 MG tablet Commonly known as: DESYREL  Take 1 tablet (100 mg total) by mouth at bedtime.   Vitamin D3 250 MCG (10000 UT) capsule Take 10,000 Units by mouth every Monday, Tuesday, Wednesday, Thursday, and Friday.        Allergies:  Allergies  Allergen Reactions   Flonase [Fluticasone  Propionate] Other (See Comments)    Nasal irritation, burning, but can tolerate advair  inhaler.     Sulfa Antibiotics Hives   Wound Dressing Adhesive Itching    Family History: Family History  Problem Relation Age of Onset   COPD Mother    Heart disease Mother    Polymyalgia rheumatica Mother    Emphysema Mother    Stroke Mother    Depression Mother    Anxiety disorder Mother    Heart disease Father    Hypertension Father    Hyperlipidemia Father    Anxiety disorder Father    Cancer Paternal Aunt         Breast   Breast cancer Paternal Aunt 65   Stroke Maternal Grandmother    Cancer Maternal Aunt    Other Brother        melas syndrom   Parkinson's disease Maternal Grandfather     Social History:   reports that she has never smoked. She has never been exposed to tobacco smoke.  She has never used smokeless tobacco. She reports that she does not currently use alcohol. She reports that she does not use drugs.  Physical Exam: BP 136/82 (BP Location: Left Arm, Patient Position: Sitting, Cuff Size: Large)   Pulse 93   Ht 5' 7 (1.702 m)   Wt 246 lb (111.6 kg)   LMP 08/19/2019   SpO2 97%   BMI 38.53 kg/m   Constitutional:  Alert and oriented, no acute distress, nontoxic appearing HEENT: , AT Cardiovascular: No clubbing, cyanosis, or edema Respiratory: Normal respiratory effort, no increased work of breathing Skin: No rashes, bruises or suspicious lesions Neurologic: Grossly intact, no focal deficits, moving all 4 extremities Psychiatric: Normal mood and affect  Laboratory Data: See epic  Pertinent Imaging: Results for orders placed during the hospital encounter of 02/05/24  DG Abd 1 View  Narrative EXAM: 1 VIEW XRAY OF THE ABDOMEN 02/05/2024 08:55:35 AM  COMPARISON: 01/20/2024  CLINICAL HISTORY: Left UVJ stone. Left ureteral stone. Per pt this is checking on status of stones.  FINDINGS:  BOWEL: Nonobstructive bowel gas pattern.  SOFT TISSUES: Small calculi in pelvis correspond to phleboliths. No urinary tract calculi identified.  BONES: No acute osseous abnormality.  Surgical clips in right upper quadrant.  IMPRESSION: 1. No urinary tract calculi identified. 2. Small pelvic calculi correspond to phleboliths. 3. Surgical clips in the right upper quadrant.  Electronically signed by: Waddell Calk MD 02/05/2024 04:30 PM EDT RP Workstation: [id number]  I personally reviewed the images referenced above and note to left hemipelvis calcific densities,  phleboliths versus retained UVJ stone.  Assessment & Plan:   1. Left ureteral stone (Primary) Difficult to assess if she has a retained left UVJ stone in light of adjacent phlebolith.  In light of #2 below, I recommended further imaging to definitively characterize this.  We discussed renal ultrasound versus CT stone study, but using shared decision making we elected to pursue CT stone study for definitive results.  If she has a retained stone, in light of #2 below we discussed that I will recommend pursuing definitive management.  We discussed options including ESWL versus ureteroscopy.  Will discuss further with her once we have CT results. - CT RENAL STONE STUDY; Future  2. Complicated UTI (urinary tract infection) She has been prescribed culture appropriate Keflex , but not yet started it.  She is asymptomatic today save for diffuse low back pain, unclear etiology.  I counseled her to start Keflex  and will repeat culture today, though UA today does appear contaminated. - Urinalysis, Complete - CULTURE, URINE COMPREHENSIVE   Return for Will call with results.  Lucie Hones, PA-C  [facility] [place] [address] [place], [place] [address] [phone]

## 2024-02-18 LAB — CULTURE, URINE COMPREHENSIVE

## 2024-02-24 ENCOUNTER — Other Ambulatory Visit: Payer: Self-pay

## 2024-02-24 MED ORDER — PHENTERMINE HCL 37.5 MG PO TABS
37.5000 mg | ORAL_TABLET | Freq: Every day | ORAL | 0 refills | Status: AC
  Filled 2024-02-24: qty 30, 30d supply, fill #0

## 2024-02-24 MED ORDER — AMPHETAMINE-DEXTROAMPHET ER 10 MG PO CP24
10.0000 mg | ORAL_CAPSULE | Freq: Every morning | ORAL | 0 refills | Status: DC
  Filled 2024-02-24: qty 30, 30d supply, fill #0

## 2024-02-25 ENCOUNTER — Ambulatory Visit: Payer: Self-pay | Admitting: Physician Assistant

## 2024-02-27 ENCOUNTER — Other Ambulatory Visit: Payer: Self-pay

## 2024-03-01 ENCOUNTER — Ambulatory Visit: Admitting: Urology

## 2024-03-01 ENCOUNTER — Other Ambulatory Visit: Payer: Self-pay

## 2024-03-01 VITALS — BP 132/81 | HR 102

## 2024-03-01 DIAGNOSIS — N3946 Mixed incontinence: Secondary | ICD-10-CM | POA: Diagnosis not present

## 2024-03-01 LAB — MICROSCOPIC EXAMINATION: Epithelial Cells (non renal): >10 /HPF — AB (ref 0–10)

## 2024-03-01 LAB — URINALYSIS, COMPLETE
Bilirubin, UA: NEGATIVE
Glucose, UA: NEGATIVE
Ketones, UA: NEGATIVE
Leukocytes,UA: NEGATIVE
Nitrite, UA: NEGATIVE
Protein,UA: NEGATIVE
RBC, UA: NEGATIVE
Specific Gravity, UA: 1.03 (ref 1.005–1.030)
Urobilinogen, Ur: 0.2 mg/dL (ref 0.2–1.0)
pH, UA: 6 (ref 5.0–7.5)

## 2024-03-01 MED ORDER — GEMTESA 75 MG PO TABS: 75.0000 mg | ORAL_TABLET | Freq: Every day | ORAL | 11 refills | Status: DC

## 2024-03-01 MED ORDER — METFORMIN HCL 500 MG PO TABS
ORAL_TABLET | ORAL | 0 refills | Status: DC
  Filled 2024-03-01: qty 60, 35d supply, fill #0

## 2024-03-01 NOTE — Progress Notes (Signed)
 03/01/2024 2:49 PM   Donna Vasquez 12/25/1965 [id number]  Referring provider: Harvey Gaetana CROME, NP [address] [place],  [place] [address]  No chief complaint on file.   HPI: I was consulted to assist the patient for recurrent bladder infections urinary incontinence and interstitial cystitis.  She is being followed and has a planned hysteroscopy for an abnormal Pap smear by Dr. Lovetta.    In the last 6 months the patient has had 4 positive cultures.  She can get cramping that worsens.  Sometimes she gets a foul-smelling urine.  No increased frequency.  Symptoms improved with antibiotics.  Many years ago in [place]  she describes a cystoscopy and being diagnosed with interstitial cystitis.  She was not put to sleep.  She was nonspecific but she tends to get cramps with her interstitial cystitis that goes away after taking Azo Standard   At baseline she can leak with coughing sneezing and sometimes with urgency.  No bedwetting.  Can leak when she walks and goes from sitting to standing position.  She wears 3-4 pads a day that are damp.  She noted ever since she was a teenager she can leak a small amount with laughing   She voids every 4-5 hours and gets up once a night.  Flow was reasonable and she does not feel empty   She has had injections of her neck with epidurals.   For the last 5 cultures have been positive.  She had a negative CT scan in September 2024.  She is has worsening hives with sulfa drugs     On pelvic examination she has had some bladder descensus at rest. Mild hypermobility of the bladder neck and negative cough test with moderate cough and no significant prolapse    Patient has recurrent bladder infections.  Pathophysiology discussed.  She has mild mixed incontinence.  She has a distant history of interstitial cystitis.  She understands if we can suppress her bladder infections her incontinence may down regulate some.  I like to have her come back  on Macrodantin  100 mg 30 x 11 for cystoscopy in approximately 6 weeks.  This can be done independent of her hysteroscopy.  If she still has incontinence affecting her quality of life I would then recommend urodynamics.  We would also look for evidence of interstitial cystitis.  I do not recommend a hydrodistention currently but I would look for evidence of bladder pain on bladder filling.    Patient is having cramping now over the weekend and I thought it was best to give her an antibiotic.  Based upon previous culture I called and Keflex  500 mg 3 times a day for 7 days.  Then patient was started on daily Macrodantin .     Last urine culture was positive Clinically infection free on daily Macrodantin  with mild decrease in incontinence.  Still using 4 pads a day.  She still can have urge incontinence and leak with walking activity   On pelvic examination patient had grade 2 hypermobility the bladder neck and a positive cough test after cystoscopy   Cystoscopy: Patient underwent flexible cystoscopy.  Bladder mucosa and trigone were normal.  She had increased vascularity within normal limits especially at high bladder volume.  No carcinoma.  No pain with bladder filling or insertion of cystoscope    Patient has mixed incontinence with a vague history of interstitial cystitis.  I will order urodynamics and proceed accordingly.  She did have a positive cough test  today.  Call if urine culture positive  Today Frequency stable.  Incontinence stable.  Last urine culture had low count Streptococcus During urodynamics patient voided 40 mL with a maximal flow 14 mL/s.  She emptied efficiently.  Maximum bladder capacity was 340 mL.  She had increased bladder sensation.  No pain.  Bladder was stable.  Her cuff leak point pressure 150 mL was 40 cm water  with severe leakage.  Her Valsalva leak point pressure was 25 cm water  with severe leakage.  At 350 mL her cuff leak point pressure was 38 cm water  with severe  leakage.  During voluntary voiding she voided 340 mL with maximal flow 10 mL/s.  Maximum voiding pressure 18 cm water .  She was straining during the voiding phase.  Bladder contractions were not well-sustained and somewhat intermittent residual 0 mL.  EMG activity noted during voiding.  Bladder neck distended less than a centimeter.  The details of the urodynamics are signed dictated     PMH: Past Medical History:  Diagnosis Date   Allergy    Anxiety    Asthma    Back pain    Bulging lumbar disc    Chicken pox    Constipation    Depression    Epilepsy (HCC)    Fatigue    Frequent headaches    Gall bladder disease    GERD (gastroesophageal reflux disease)    Glaucoma suspect    Headache    Heartburn    Hyperlipidemia    Hypertension    IBS (irritable bowel syndrome)    Interstitial cystitis    Lumbago    Palpitations    Plantar fasciitis    RA (rheumatoid arthritis) (HCC)    Rheumatoid arthritis (HCC)    Shortness of breath on exertion    Sleep apnea    Staring episodes    Trouble in sleeping    Urine incontinence    Vertigo    Vision changes    Vitamin D  deficiency     Surgical History: Past Surgical History:  Procedure Laterality Date   CHOLECYSTECTOMY     COLONOSCOPY WITH PROPOFOL  N/A 05/16/2023   Procedure: COLONOSCOPY WITH PROPOFOL ;  Surgeon: Maryruth Ole DASEN, MD;  Location: [facility];  Service: Gastroenterology;  Laterality: N/A;   cyst removed from back     HYSTEROSCOPY WITH D & C N/A 08/22/2023   Procedure: DILATATION AND CURETTAGE /HYSTEROSCOPY;  Surgeon: Schermerhorn, Beverli GAILS, MD;  Location: [facility] ORS;  Service: Gynecology;  Laterality: N/A;  POSSIBLE MYOSURE, POLYPECTOMY   NASAL SINUS SURGERY     TUBAL LIGATION  1999    Home Medications:  Allergies as of 03/01/2024       Reactions   Flonase [fluticasone  Propionate] Other (See Comments)   Nasal irritation, burning, but can tolerate advair  inhaler.     Sulfa Antibiotics Hives   Wound  Dressing Adhesive Itching        Medication List        Accurate as of March 01, 2024  2:49 PM. If you have any questions, ask your nurse or doctor.          acetaminophen  500 MG tablet Commonly known as: TYLENOL  Take 1,000 mg by mouth every 6 (six) hours as needed for moderate pain (pain score 4-6).   albuterol  108 (90 Base) MCG/ACT inhaler Commonly known as: VENTOLIN  HFA Inhale 2 puffs into the lungs every 4 (four) hours as needed for Wheezing   aspirin 81 MG tablet Take 81 mg  by mouth daily.   cephALEXin  500 MG capsule Commonly known as: KEFLEX  Take 1 capsule (500 mg total) by mouth 2 (two) times daily for 7 days For skin infection.   cyanocobalamin  1000 MCG tablet Commonly known as: VITAMIN B12 Take 1,000 mcg by mouth daily.   fluticasone -salmeterol 250-50 MCG/ACT Aepb Commonly known as: ADVAIR  Inhale 1 puff into the lungs daily. What changed:  when to take this reasons to take this   Advair  Diskus 250-50 MCG/ACT Aepb Generic drug: fluticasone -salmeterol Inhale 1 puff into the lungs daily. What changed: Another medication with the same name was changed. Make sure you understand how and when to take each.   Lacosamide  150 MG Tabs Take 1 tablet (150 mg total) by mouth at bedtime.   lisdexamfetamine 70 MG capsule Commonly known as: VYVANSE  Take 1 capsule (70 mg total) by mouth every morning.   losartan  25 MG tablet Commonly known as: COZAAR  Take 1 tablet (25 mg total) by mouth daily.   montelukast  10 MG tablet Commonly known as: SINGULAIR  Take 1 tablet (10 mg total) by mouth daily.   niacin  1000 MG CR tablet Commonly known as: NIASPAN  Take 1 tablet (1,000 mg total) by mouth at bedtime.   nitrofurantoin  100 MG capsule Commonly known as: Macrodantin  Take 1 capsule (100 mg total) by mouth daily.   phentermine 37.5 MG tablet Commonly known as: ADIPEX-P Take 1 tablet (37.5 mg total) by mouth every morning before breakfast for 30 days    progesterone  100 MG capsule Commonly known as: PROMETRIUM  Take 1 capsule (100 mg total) by mouth at bedtime.   sertraline  100 MG tablet Commonly known as: ZOLOFT  Take 1 tablet (100 mg total) by mouth daily.   simvastatin  20 MG tablet Commonly known as: ZOCOR  Take 1 tablet (20 mg total) by mouth at bedtime.   tamsulosin  0.4 MG Caps capsule Commonly known as: FLOMAX  Take 1 capsule (0.4 mg total) by mouth daily.   traZODone  100 MG tablet Commonly known as: DESYREL  Take 1 tablet (100 mg total) by mouth at bedtime.   Vitamin D3 250 MCG (10000 UT) capsule Take 10,000 Units by mouth every Monday, Tuesday, Wednesday, Thursday, and Friday.        Allergies:  Allergies  Allergen Reactions   Flonase [Fluticasone  Propionate] Other (See Comments)    Nasal irritation, burning, but can tolerate advair  inhaler.     Sulfa Antibiotics Hives   Wound Dressing Adhesive Itching    Family History: Family History  Problem Relation Age of Onset   COPD Mother    Heart disease Mother    Polymyalgia rheumatica Mother    Emphysema Mother    Stroke Mother    Depression Mother    Anxiety disorder Mother    Heart disease Father    Hypertension Father    Hyperlipidemia Father    Anxiety disorder Father    Cancer Paternal Aunt        Breast   Breast cancer Paternal Aunt 48   Stroke Maternal Grandmother    Cancer Maternal Aunt    Other Brother        melas syndrom   Parkinson's disease Maternal Grandfather     Social History:  reports that she has never smoked. She has never been exposed to tobacco smoke. She has never used smokeless tobacco. She reports that she does not currently use alcohol. She reports that she does not use drugs.  ROS:  Physical Exam: LMP 08/19/2019   Constitutional:  Alert and oriented, No acute distress. HEENT: [place] AT, moist mucus membranes.  Trachea midline, no masses.   Laboratory Data: Lab  Results  Component Value Date   WBC 8.1 01/10/2024   HGB 13.6 01/10/2024   HCT 39.2 01/10/2024   MCV 93.6 01/10/2024   PLT 211 01/10/2024    Lab Results  Component Value Date   CREATININE 0.97 01/10/2024    No results found for: PSA  No results found for: TESTOSTERONE  Lab Results  Component Value Date   HGBA1C 5.0 12/13/2021    Urinalysis    Component Value Date/Time   COLORURINE STRAW (A) 01/10/2024 1743   APPEARANCEUR Clear 02/12/2024 0835   LABSPEC 1.012 01/10/2024 1743   PHURINE 9.0 (H) 01/10/2024 1743   GLUCOSEU Negative 02/12/2024 0835   HGBUR SMALL (A) 01/10/2024 1743   BILIRUBINUR Negative 02/12/2024 0835   KETONESUR 5 (A) 01/10/2024 1743   PROTEINUR Negative 02/12/2024 0835   PROTEINUR NEGATIVE 01/10/2024 1743   UROBILINOGEN 0.2 12/16/2014 1532   NITRITE Negative 02/12/2024 0835   NITRITE NEGATIVE 01/10/2024 1743   LEUKOCYTESUR 2+ (A) 02/12/2024 0835   LEUKOCYTESUR NEGATIVE 01/10/2024 1743    Pertinent Imaging:   Assessment & Plan: Clinically the patient does have urge incontinence.  The leaking with walking appears to be due to significant renal insufficiency.  There was no evidence of interstitial cystitis on the urodynamic study but she may be at higher risk of up-regulation if she had a sling.  Role of medication and physical therapy discussed.  Bulking agent and/or sling would be recommended.  Important keep on Macrodantin  and infection free.  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  We talked about healthcare.  I am going to give her Gemtesa.  I will then try Myrbetriq.  She already does Kegel exercises.  No PT consult.  I will then discuss bulking agent and sling if she fails to beta 3 agonist  Patient passed the kidney stone in July.  It was a 2 mm left ureterovesical junction stone.  There was a question whether or not she passed it when she was seen February 12, 2024.  There was a question she could have a  phlebolith versus distal stone.  She thinks she still may have a stone.  Apparently insurance would not pay for it.  We will follow-up with [facility] regarding CT scan versus ultrasound.  No blood in urine today  I am not convinced that the patient had a breakthrough infection for surgeon and I am not can to change her prophylaxis yet but will if she gets another obvious UTI.  She agreed with the plan.    There are no diagnoses linked to this encounter.  No follow-ups on file.  Glendia DELENA Elizabeth, MD  [facility] [address] [place], [place] [address] [phone]

## 2024-03-04 LAB — CULTURE, URINE COMPREHENSIVE

## 2024-03-18 ENCOUNTER — Ambulatory Visit
Admission: RE | Admit: 2024-03-18 | Discharge: 2024-03-18 | Disposition: A | Discharge status: Home / Self Care | Source: Ambulatory Visit | Attending: Physician Assistant | Admitting: Physician Assistant

## 2024-03-18 DIAGNOSIS — N201 Calculus of ureter: Secondary | ICD-10-CM | POA: Diagnosis present

## 2024-03-22 ENCOUNTER — Other Ambulatory Visit: Payer: Self-pay

## 2024-03-23 ENCOUNTER — Other Ambulatory Visit: Payer: Self-pay

## 2024-03-24 ENCOUNTER — Other Ambulatory Visit: Payer: Self-pay

## 2024-03-25 ENCOUNTER — Other Ambulatory Visit: Payer: Self-pay

## 2024-03-26 ENCOUNTER — Ambulatory Visit
Admission: RE | Admit: 2024-03-26 | Discharge: 2024-03-26 | Disposition: A | Discharge status: Home / Self Care | Source: Ambulatory Visit | Attending: Family Medicine | Admitting: Family Medicine

## 2024-03-26 ENCOUNTER — Other Ambulatory Visit: Payer: Self-pay

## 2024-03-26 DIAGNOSIS — Z1231 Encounter for screening mammogram for malignant neoplasm of breast: Secondary | ICD-10-CM | POA: Diagnosis present

## 2024-03-29 ENCOUNTER — Other Ambulatory Visit: Payer: Self-pay

## 2024-03-31 ENCOUNTER — Other Ambulatory Visit: Payer: Self-pay

## 2024-04-03 ENCOUNTER — Other Ambulatory Visit: Payer: Self-pay

## 2024-04-05 ENCOUNTER — Other Ambulatory Visit: Payer: Self-pay

## 2024-04-06 ENCOUNTER — Other Ambulatory Visit: Payer: Self-pay

## 2024-04-07 ENCOUNTER — Other Ambulatory Visit: Payer: Self-pay

## 2024-04-08 ENCOUNTER — Other Ambulatory Visit: Payer: Self-pay

## 2024-04-14 ENCOUNTER — Other Ambulatory Visit: Payer: Self-pay

## 2024-04-14 MED ORDER — METFORMIN HCL 500 MG PO TABS
1000.0000 mg | ORAL_TABLET | Freq: Every day | ORAL | 11 refills | Status: AC
  Filled 2024-04-14: qty 60, 30d supply, fill #0

## 2024-04-20 ENCOUNTER — Other Ambulatory Visit: Payer: Self-pay

## 2024-04-22 ENCOUNTER — Other Ambulatory Visit: Payer: Self-pay

## 2024-05-07 ENCOUNTER — Other Ambulatory Visit: Payer: Self-pay

## 2024-05-10 ENCOUNTER — Ambulatory Visit: Admitting: Urology

## 2024-05-10 ENCOUNTER — Other Ambulatory Visit: Payer: Self-pay

## 2024-05-24 ENCOUNTER — Ambulatory Visit: Admitting: Urology

## 2024-05-27 ENCOUNTER — Other Ambulatory Visit: Payer: Self-pay

## 2024-05-27 MED ORDER — PREDNISONE 20 MG PO TABS
ORAL_TABLET | ORAL | 0 refills | Status: AC
  Filled 2024-05-27: qty 10, 5d supply, fill #0

## 2024-05-27 MED ORDER — AMOXICILLIN-POT CLAVULANATE 875-125 MG PO TABS
ORAL_TABLET | ORAL | 0 refills | Status: AC
  Filled 2024-05-27: qty 20, 10d supply, fill #0

## 2024-06-03 ENCOUNTER — Encounter: Payer: Self-pay | Admitting: Urology

## 2024-06-03 ENCOUNTER — Other Ambulatory Visit: Payer: Self-pay

## 2024-06-04 ENCOUNTER — Other Ambulatory Visit: Payer: Self-pay

## 2024-06-04 ENCOUNTER — Telehealth: Payer: Self-pay | Admitting: *Deleted

## 2024-06-04 MED ORDER — GEMTESA 75 MG PO TABS
75.0000 mg | ORAL_TABLET | Freq: Every day | ORAL | 11 refills | Status: AC
  Filled 2024-06-04: qty 30, 30d supply, fill #0

## 2024-06-04 NOTE — Telephone Encounter (Signed)
 Patient called in today and states she think she is passing a stone . She states she has been having pain in the last 10 minutes. No fever or chills. I talked with sam and she looked through her chart and advised for patient to take some pain releasers and drink water  to see if the stone will pass on its on. Advised patient that and she understands and agrees . Patient to call on Monday if she needs too

## 2024-06-04 NOTE — Addendum Note (Signed)
 Addended byBETHA CORIE PLATER on: 06/04/2024 08:08 AM   Modules accepted: Orders

## 2024-06-07 ENCOUNTER — Telehealth: Payer: Self-pay

## 2024-06-07 NOTE — Telephone Encounter (Signed)
 Patient called stating she was seen at [facility] ER for a right kidney stone. After confirming the CT scan with the PA it looks like the stone is passable and pt stated they are not in as much discomfort today as they were over the weekend. She has pain and nausea meds. She was instructed to keep taking the flomax  and push fluid and to call us  back if she worsens or has any concerns. Pt voiced understanding.

## 2024-06-16 ENCOUNTER — Other Ambulatory Visit: Payer: Self-pay

## 2024-06-16 MED ORDER — LACOSAMIDE 150 MG PO TABS
150.0000 mg | ORAL_TABLET | Freq: Every evening | ORAL | 3 refills | Status: AC
  Filled 2024-06-16: qty 30, 30d supply, fill #0

## 2024-06-16 MED ORDER — AMPHETAMINE-DEXTROAMPHETAMINE 20 MG PO TABS
20.0000 mg | ORAL_TABLET | Freq: Every morning | ORAL | 0 refills | Status: AC
  Filled 2024-06-16: qty 30, 30d supply, fill #0

## 2024-06-17 ENCOUNTER — Other Ambulatory Visit: Payer: Self-pay

## 2024-06-18 ENCOUNTER — Other Ambulatory Visit: Payer: Self-pay

## 2024-06-18 MED ORDER — NIACIN ER (ANTIHYPERLIPIDEMIC) 1000 MG PO TBCR
1000.0000 mg | EXTENDED_RELEASE_TABLET | Freq: Every day | ORAL | 1 refills | Status: AC
  Filled 2024-06-18: qty 90, 90d supply, fill #0

## 2024-06-21 ENCOUNTER — Ambulatory Visit: Admitting: Urology

## 2024-06-23 ENCOUNTER — Other Ambulatory Visit: Payer: Self-pay

## 2024-06-24 ENCOUNTER — Other Ambulatory Visit: Payer: Self-pay

## 2024-07-19 ENCOUNTER — Ambulatory Visit: Admitting: Urology

## 2024-08-02 ENCOUNTER — Ambulatory Visit: Admitting: Urology
